# Patient Record
Sex: Female | Born: 1945
Health system: Southern US, Community
[De-identification: ages and names within clinical notes are randomized; demographics above are authoritative.]

## PROBLEM LIST (undated history)

## (undated) DIAGNOSIS — M659 Synovitis and tenosynovitis, unspecified: Secondary | ICD-10-CM

## (undated) DIAGNOSIS — I82409 Acute embolism and thrombosis of unspecified deep veins of unspecified lower extremity: Secondary | ICD-10-CM

## (undated) DIAGNOSIS — H409 Unspecified glaucoma: Secondary | ICD-10-CM

## (undated) DIAGNOSIS — D649 Anemia, unspecified: Secondary | ICD-10-CM

## (undated) DIAGNOSIS — K219 Gastro-esophageal reflux disease without esophagitis: Secondary | ICD-10-CM

## (undated) DIAGNOSIS — Z9889 Other specified postprocedural states: Secondary | ICD-10-CM

## (undated) DIAGNOSIS — E039 Hypothyroidism, unspecified: Secondary | ICD-10-CM

## (undated) DIAGNOSIS — E785 Hyperlipidemia, unspecified: Secondary | ICD-10-CM

## (undated) DIAGNOSIS — I10 Essential (primary) hypertension: Secondary | ICD-10-CM

## (undated) DIAGNOSIS — C801 Malignant (primary) neoplasm, unspecified: Secondary | ICD-10-CM

## (undated) DIAGNOSIS — E663 Overweight: Secondary | ICD-10-CM

## (undated) DIAGNOSIS — C689 Malignant neoplasm of urinary organ, unspecified: Secondary | ICD-10-CM

## (undated) DIAGNOSIS — T4145XA Adverse effect of unspecified anesthetic, initial encounter: Secondary | ICD-10-CM

## (undated) DIAGNOSIS — T8859XA Other complications of anesthesia, initial encounter: Secondary | ICD-10-CM

## (undated) DIAGNOSIS — D682 Hereditary deficiency of other clotting factors: Secondary | ICD-10-CM

## (undated) DIAGNOSIS — K8021 Calculus of gallbladder without cholecystitis with obstruction: Secondary | ICD-10-CM

## (undated) DIAGNOSIS — M1990 Unspecified osteoarthritis, unspecified site: Secondary | ICD-10-CM

## (undated) DIAGNOSIS — R112 Nausea with vomiting, unspecified: Secondary | ICD-10-CM

## (undated) HISTORY — PX: CHOLECYSTECTOMY: SHX55

## (undated) HISTORY — PX: OTHER SURGICAL HISTORY: SHX169

## (undated) HISTORY — DX: Malignant neoplasm of urinary organ, unspecified: C68.9

## (undated) HISTORY — PX: TONSILLECTOMY: SUR1361

---
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
## 1991-07-07 HISTORY — PX: HAND CONTRACTURE RELEASE: SHX1724

## 1996-07-06 HISTORY — PX: OVARY SURGERY: SHX727

## 1998-07-13 ENCOUNTER — Emergency Department (HOSPITAL_COMMUNITY): Admission: EM | Admit: 1998-07-13 | Discharge: 1998-07-13 | Payer: Self-pay | Admitting: Emergency Medicine

## 1999-05-01 ENCOUNTER — Other Ambulatory Visit: Admission: RE | Admit: 1999-05-01 | Discharge: 1999-05-01 | Payer: Self-pay | Admitting: Gynecology

## 1999-08-06 ENCOUNTER — Encounter: Admission: RE | Admit: 1999-08-06 | Discharge: 1999-08-06 | Payer: Self-pay | Admitting: Gynecology

## 1999-08-06 ENCOUNTER — Encounter: Payer: Self-pay | Admitting: Gynecology

## 2000-05-05 ENCOUNTER — Other Ambulatory Visit: Admission: RE | Admit: 2000-05-05 | Discharge: 2000-05-05 | Payer: Self-pay | Admitting: Gynecology

## 2000-07-19 ENCOUNTER — Ambulatory Visit (HOSPITAL_COMMUNITY): Admission: RE | Admit: 2000-07-19 | Discharge: 2000-07-19 | Payer: Self-pay | Admitting: Internal Medicine

## 2000-08-11 ENCOUNTER — Encounter: Admission: RE | Admit: 2000-08-11 | Discharge: 2000-08-11 | Payer: Self-pay | Admitting: Gynecology

## 2000-08-11 ENCOUNTER — Encounter: Payer: Self-pay | Admitting: Gynecology

## 2001-04-19 ENCOUNTER — Other Ambulatory Visit: Admission: RE | Admit: 2001-04-19 | Discharge: 2001-04-19 | Payer: Self-pay | Admitting: Gynecology

## 2001-06-07 ENCOUNTER — Inpatient Hospital Stay (HOSPITAL_COMMUNITY): Admission: RE | Admit: 2001-06-07 | Discharge: 2001-06-08 | Payer: Self-pay | Admitting: Gynecology

## 2001-06-07 ENCOUNTER — Encounter (INDEPENDENT_AMBULATORY_CARE_PROVIDER_SITE_OTHER): Payer: Self-pay

## 2001-08-18 ENCOUNTER — Encounter: Admission: RE | Admit: 2001-08-18 | Discharge: 2001-08-18 | Payer: Self-pay | Admitting: Gynecology

## 2001-08-18 ENCOUNTER — Encounter: Payer: Self-pay | Admitting: Gynecology

## 2002-04-24 ENCOUNTER — Other Ambulatory Visit: Admission: RE | Admit: 2002-04-24 | Discharge: 2002-04-24 | Payer: Self-pay | Admitting: Gynecology

## 2002-07-06 HISTORY — PX: KNEE ARTHROSCOPY: SUR90

## 2002-07-28 ENCOUNTER — Encounter: Admission: RE | Admit: 2002-07-28 | Discharge: 2002-07-28 | Payer: Self-pay | Admitting: Gynecology

## 2002-07-28 ENCOUNTER — Encounter: Payer: Self-pay | Admitting: Gynecology

## 2002-08-21 ENCOUNTER — Encounter: Payer: Self-pay | Admitting: Gynecology

## 2002-08-21 ENCOUNTER — Encounter: Admission: RE | Admit: 2002-08-21 | Discharge: 2002-08-21 | Payer: Self-pay | Admitting: Gynecology

## 2002-08-29 ENCOUNTER — Encounter: Admission: RE | Admit: 2002-08-29 | Discharge: 2002-08-29 | Payer: Self-pay | Admitting: Internal Medicine

## 2002-10-31 ENCOUNTER — Encounter: Admission: RE | Admit: 2002-10-31 | Discharge: 2002-10-31 | Payer: Self-pay | Admitting: Internal Medicine

## 2002-11-01 ENCOUNTER — Encounter: Admission: RE | Admit: 2002-11-01 | Discharge: 2002-11-01 | Payer: Self-pay | Admitting: Internal Medicine

## 2002-11-02 ENCOUNTER — Encounter: Payer: Self-pay | Admitting: Internal Medicine

## 2002-11-02 ENCOUNTER — Ambulatory Visit (HOSPITAL_COMMUNITY): Admission: RE | Admit: 2002-11-02 | Discharge: 2002-11-02 | Payer: Self-pay | Admitting: Internal Medicine

## 2002-11-03 ENCOUNTER — Inpatient Hospital Stay (HOSPITAL_COMMUNITY): Admission: AD | Admit: 2002-11-03 | Discharge: 2002-11-09 | Payer: Self-pay | Admitting: Internal Medicine

## 2002-11-08 ENCOUNTER — Encounter: Payer: Self-pay | Admitting: Internal Medicine

## 2002-11-13 ENCOUNTER — Encounter: Admission: RE | Admit: 2002-11-13 | Discharge: 2002-11-13 | Payer: Self-pay | Admitting: Internal Medicine

## 2002-11-24 ENCOUNTER — Encounter: Admission: RE | Admit: 2002-11-24 | Discharge: 2002-11-24 | Payer: Self-pay | Admitting: Internal Medicine

## 2002-11-27 ENCOUNTER — Encounter: Admission: RE | Admit: 2002-11-27 | Discharge: 2002-11-27 | Payer: Self-pay | Admitting: Internal Medicine

## 2002-11-28 ENCOUNTER — Encounter (INDEPENDENT_AMBULATORY_CARE_PROVIDER_SITE_OTHER): Payer: Self-pay | Admitting: Internal Medicine

## 2002-12-11 ENCOUNTER — Encounter: Admission: RE | Admit: 2002-12-11 | Discharge: 2002-12-11 | Payer: Self-pay | Admitting: Internal Medicine

## 2003-01-02 ENCOUNTER — Encounter: Payer: Self-pay | Admitting: Gastroenterology

## 2003-01-02 ENCOUNTER — Observation Stay (HOSPITAL_COMMUNITY): Admission: AD | Admit: 2003-01-02 | Discharge: 2003-01-04 | Payer: Self-pay | Admitting: Gastroenterology

## 2003-01-03 ENCOUNTER — Encounter: Payer: Self-pay | Admitting: General Surgery

## 2003-01-03 ENCOUNTER — Encounter (INDEPENDENT_AMBULATORY_CARE_PROVIDER_SITE_OTHER): Payer: Self-pay | Admitting: *Deleted

## 2003-01-22 ENCOUNTER — Encounter: Admission: RE | Admit: 2003-01-22 | Discharge: 2003-01-22 | Payer: Self-pay | Admitting: Internal Medicine

## 2003-02-19 ENCOUNTER — Encounter: Admission: RE | Admit: 2003-02-19 | Discharge: 2003-02-19 | Payer: Self-pay | Admitting: Internal Medicine

## 2003-03-19 ENCOUNTER — Encounter: Admission: RE | Admit: 2003-03-19 | Discharge: 2003-03-19 | Payer: Self-pay | Admitting: Infectious Diseases

## 2003-03-27 ENCOUNTER — Encounter: Payer: Self-pay | Admitting: Gastroenterology

## 2003-03-27 ENCOUNTER — Ambulatory Visit (HOSPITAL_COMMUNITY): Admission: RE | Admit: 2003-03-27 | Discharge: 2003-03-27 | Payer: Self-pay | Admitting: Gastroenterology

## 2003-04-16 ENCOUNTER — Encounter: Admission: RE | Admit: 2003-04-16 | Discharge: 2003-04-16 | Payer: Self-pay | Admitting: Internal Medicine

## 2003-04-23 ENCOUNTER — Other Ambulatory Visit: Admission: RE | Admit: 2003-04-23 | Discharge: 2003-04-23 | Payer: Self-pay | Admitting: Gynecology

## 2003-05-07 ENCOUNTER — Encounter: Admission: RE | Admit: 2003-05-07 | Discharge: 2003-05-07 | Payer: Self-pay | Admitting: Internal Medicine

## 2003-05-21 ENCOUNTER — Encounter: Admission: RE | Admit: 2003-05-21 | Discharge: 2003-05-21 | Payer: Self-pay | Admitting: Internal Medicine

## 2003-06-04 ENCOUNTER — Ambulatory Visit (HOSPITAL_COMMUNITY): Admission: RE | Admit: 2003-06-04 | Discharge: 2003-06-04 | Payer: Self-pay | Admitting: Gastroenterology

## 2003-06-04 LAB — HM COLONOSCOPY

## 2003-06-18 ENCOUNTER — Encounter: Admission: RE | Admit: 2003-06-18 | Discharge: 2003-06-18 | Payer: Self-pay | Admitting: Internal Medicine

## 2003-07-23 ENCOUNTER — Encounter: Admission: RE | Admit: 2003-07-23 | Discharge: 2003-07-23 | Payer: Self-pay | Admitting: Internal Medicine

## 2003-08-20 ENCOUNTER — Encounter: Admission: RE | Admit: 2003-08-20 | Discharge: 2003-08-20 | Payer: Self-pay | Admitting: Internal Medicine

## 2003-08-23 ENCOUNTER — Encounter: Admission: RE | Admit: 2003-08-23 | Discharge: 2003-08-23 | Payer: Self-pay | Admitting: Internal Medicine

## 2003-09-10 ENCOUNTER — Encounter: Admission: RE | Admit: 2003-09-10 | Discharge: 2003-09-10 | Payer: Self-pay | Admitting: Internal Medicine

## 2003-09-11 ENCOUNTER — Encounter: Admission: RE | Admit: 2003-09-11 | Discharge: 2003-09-11 | Payer: Self-pay | Admitting: Internal Medicine

## 2003-10-15 ENCOUNTER — Encounter: Admission: RE | Admit: 2003-10-15 | Discharge: 2003-10-15 | Payer: Self-pay | Admitting: Internal Medicine

## 2003-11-19 ENCOUNTER — Encounter: Admission: RE | Admit: 2003-11-19 | Discharge: 2003-11-19 | Payer: Self-pay | Admitting: Internal Medicine

## 2004-01-24 ENCOUNTER — Ambulatory Visit (HOSPITAL_COMMUNITY): Admission: RE | Admit: 2004-01-24 | Discharge: 2004-01-24 | Payer: Self-pay | Admitting: Orthopedic Surgery

## 2004-01-24 ENCOUNTER — Ambulatory Visit (HOSPITAL_BASED_OUTPATIENT_CLINIC_OR_DEPARTMENT_OTHER): Admission: RE | Admit: 2004-01-24 | Discharge: 2004-01-24 | Payer: Self-pay | Admitting: Orthopedic Surgery

## 2004-02-11 ENCOUNTER — Encounter: Admission: RE | Admit: 2004-02-11 | Discharge: 2004-02-11 | Payer: Self-pay | Admitting: Internal Medicine

## 2004-03-24 ENCOUNTER — Ambulatory Visit: Payer: Self-pay | Admitting: Internal Medicine

## 2004-04-21 ENCOUNTER — Ambulatory Visit: Payer: Self-pay | Admitting: Internal Medicine

## 2004-05-07 ENCOUNTER — Ambulatory Visit: Payer: Self-pay | Admitting: Internal Medicine

## 2004-05-15 ENCOUNTER — Ambulatory Visit: Payer: Self-pay | Admitting: Internal Medicine

## 2004-06-03 ENCOUNTER — Ambulatory Visit: Payer: Self-pay | Admitting: Internal Medicine

## 2004-07-03 ENCOUNTER — Ambulatory Visit: Payer: Self-pay | Admitting: Internal Medicine

## 2004-07-28 ENCOUNTER — Ambulatory Visit: Payer: Self-pay | Admitting: Internal Medicine

## 2004-08-11 ENCOUNTER — Ambulatory Visit: Payer: Self-pay | Admitting: Internal Medicine

## 2004-08-25 ENCOUNTER — Encounter: Admission: RE | Admit: 2004-08-25 | Discharge: 2004-08-25 | Payer: Self-pay | Admitting: Gynecology

## 2004-09-22 ENCOUNTER — Ambulatory Visit: Payer: Self-pay | Admitting: Internal Medicine

## 2004-10-02 ENCOUNTER — Ambulatory Visit: Payer: Self-pay | Admitting: Internal Medicine

## 2004-10-03 ENCOUNTER — Ambulatory Visit: Payer: Self-pay | Admitting: Internal Medicine

## 2004-10-20 ENCOUNTER — Ambulatory Visit: Payer: Self-pay | Admitting: Internal Medicine

## 2004-11-17 ENCOUNTER — Ambulatory Visit: Payer: Self-pay | Admitting: Internal Medicine

## 2004-11-24 ENCOUNTER — Ambulatory Visit: Payer: Self-pay | Admitting: Internal Medicine

## 2004-12-29 ENCOUNTER — Ambulatory Visit: Payer: Self-pay | Admitting: Internal Medicine

## 2005-01-26 ENCOUNTER — Ambulatory Visit: Payer: Self-pay | Admitting: Internal Medicine

## 2005-03-09 ENCOUNTER — Emergency Department (HOSPITAL_COMMUNITY): Admission: EM | Admit: 2005-03-09 | Discharge: 2005-03-09 | Payer: Self-pay | Admitting: Emergency Medicine

## 2005-03-23 ENCOUNTER — Ambulatory Visit: Payer: Self-pay | Admitting: Internal Medicine

## 2005-05-04 ENCOUNTER — Ambulatory Visit: Payer: Self-pay | Admitting: Internal Medicine

## 2005-06-18 ENCOUNTER — Ambulatory Visit: Payer: Self-pay | Admitting: Internal Medicine

## 2005-07-20 ENCOUNTER — Ambulatory Visit: Payer: Self-pay | Admitting: Internal Medicine

## 2005-08-17 ENCOUNTER — Ambulatory Visit: Payer: Self-pay | Admitting: Internal Medicine

## 2005-09-02 ENCOUNTER — Encounter (INDEPENDENT_AMBULATORY_CARE_PROVIDER_SITE_OTHER): Payer: Self-pay | Admitting: Internal Medicine

## 2005-09-02 ENCOUNTER — Encounter: Admission: RE | Admit: 2005-09-02 | Discharge: 2005-09-02 | Payer: Self-pay | Admitting: Gynecology

## 2005-09-17 ENCOUNTER — Encounter: Admission: RE | Admit: 2005-09-17 | Discharge: 2005-09-17 | Payer: Self-pay | Admitting: Gynecology

## 2005-09-28 ENCOUNTER — Ambulatory Visit: Payer: Self-pay | Admitting: Internal Medicine

## 2005-11-02 ENCOUNTER — Ambulatory Visit: Payer: Self-pay | Admitting: Hospitalist

## 2005-12-02 ENCOUNTER — Emergency Department (HOSPITAL_COMMUNITY): Admission: EM | Admit: 2005-12-02 | Discharge: 2005-12-02 | Payer: Self-pay | Admitting: Emergency Medicine

## 2005-12-14 ENCOUNTER — Ambulatory Visit: Payer: Self-pay | Admitting: Internal Medicine

## 2006-01-18 ENCOUNTER — Ambulatory Visit: Payer: Self-pay | Admitting: Internal Medicine

## 2006-02-02 ENCOUNTER — Ambulatory Visit: Payer: Self-pay | Admitting: Internal Medicine

## 2006-02-18 ENCOUNTER — Ambulatory Visit (HOSPITAL_COMMUNITY): Admission: RE | Admit: 2006-02-18 | Discharge: 2006-02-18 | Payer: Self-pay | Admitting: Sports Medicine

## 2006-03-01 ENCOUNTER — Ambulatory Visit: Payer: Self-pay | Admitting: Hospitalist

## 2006-03-29 ENCOUNTER — Ambulatory Visit: Payer: Self-pay | Admitting: Internal Medicine

## 2006-04-21 DIAGNOSIS — M25569 Pain in unspecified knee: Secondary | ICD-10-CM | POA: Insufficient documentation

## 2006-04-21 DIAGNOSIS — D539 Nutritional anemia, unspecified: Secondary | ICD-10-CM | POA: Insufficient documentation

## 2006-04-21 DIAGNOSIS — E039 Hypothyroidism, unspecified: Secondary | ICD-10-CM | POA: Insufficient documentation

## 2006-04-21 DIAGNOSIS — M659 Unspecified synovitis and tenosynovitis, unspecified site: Secondary | ICD-10-CM | POA: Insufficient documentation

## 2006-04-21 DIAGNOSIS — K8021 Calculus of gallbladder without cholecystitis with obstruction: Secondary | ICD-10-CM | POA: Insufficient documentation

## 2006-04-21 DIAGNOSIS — Z86718 Personal history of other venous thrombosis and embolism: Secondary | ICD-10-CM | POA: Insufficient documentation

## 2006-04-21 HISTORY — DX: Calculus of gallbladder without cholecystitis with obstruction: K80.21

## 2006-04-22 ENCOUNTER — Other Ambulatory Visit: Admission: RE | Admit: 2006-04-22 | Discharge: 2006-04-22 | Payer: Self-pay | Admitting: Gynecology

## 2006-04-25 ENCOUNTER — Emergency Department (HOSPITAL_COMMUNITY): Admission: EM | Admit: 2006-04-25 | Discharge: 2006-04-25 | Payer: Self-pay | Admitting: Emergency Medicine

## 2006-05-17 ENCOUNTER — Ambulatory Visit: Payer: Self-pay | Admitting: Hospitalist

## 2006-06-03 DIAGNOSIS — D6869 Other thrombophilia: Secondary | ICD-10-CM | POA: Insufficient documentation

## 2006-06-03 DIAGNOSIS — D649 Anemia, unspecified: Secondary | ICD-10-CM

## 2006-06-03 DIAGNOSIS — D689 Coagulation defect, unspecified: Secondary | ICD-10-CM

## 2006-06-03 DIAGNOSIS — E785 Hyperlipidemia, unspecified: Secondary | ICD-10-CM

## 2006-06-03 HISTORY — DX: Anemia, unspecified: D64.9

## 2006-06-03 HISTORY — DX: Hyperlipidemia, unspecified: E78.5

## 2006-06-21 ENCOUNTER — Ambulatory Visit: Payer: Self-pay | Admitting: *Deleted

## 2006-06-24 ENCOUNTER — Ambulatory Visit (HOSPITAL_BASED_OUTPATIENT_CLINIC_OR_DEPARTMENT_OTHER): Admission: RE | Admit: 2006-06-24 | Discharge: 2006-06-24 | Payer: Self-pay | Admitting: Orthopedic Surgery

## 2006-07-01 ENCOUNTER — Ambulatory Visit: Payer: Self-pay | Admitting: Internal Medicine

## 2006-07-06 HISTORY — PX: ANKLE ARTHROPLASTY: SUR68

## 2006-07-26 ENCOUNTER — Ambulatory Visit: Payer: Self-pay | Admitting: Internal Medicine

## 2006-07-27 ENCOUNTER — Emergency Department (HOSPITAL_COMMUNITY): Admission: EM | Admit: 2006-07-27 | Discharge: 2006-07-27 | Payer: Self-pay | Admitting: Family Medicine

## 2006-08-05 ENCOUNTER — Ambulatory Visit (HOSPITAL_BASED_OUTPATIENT_CLINIC_OR_DEPARTMENT_OTHER): Admission: RE | Admit: 2006-08-05 | Discharge: 2006-08-06 | Payer: Self-pay | Admitting: Orthopedic Surgery

## 2006-08-30 ENCOUNTER — Ambulatory Visit: Payer: Self-pay | Admitting: Internal Medicine

## 2006-08-30 LAB — CONVERTED CEMR LAB: INR: 4.7

## 2006-09-13 ENCOUNTER — Ambulatory Visit: Payer: Self-pay | Admitting: Hospitalist

## 2006-09-13 ENCOUNTER — Encounter (INDEPENDENT_AMBULATORY_CARE_PROVIDER_SITE_OTHER): Payer: Self-pay | Admitting: Pharmacist

## 2006-09-13 LAB — CONVERTED CEMR LAB

## 2006-09-20 ENCOUNTER — Ambulatory Visit: Payer: Self-pay | Admitting: *Deleted

## 2006-09-20 ENCOUNTER — Encounter (INDEPENDENT_AMBULATORY_CARE_PROVIDER_SITE_OTHER): Payer: Self-pay | Admitting: Pharmacist

## 2006-09-30 ENCOUNTER — Encounter (INDEPENDENT_AMBULATORY_CARE_PROVIDER_SITE_OTHER): Payer: Self-pay | Admitting: Internal Medicine

## 2006-09-30 ENCOUNTER — Encounter: Admission: RE | Admit: 2006-09-30 | Discharge: 2006-09-30 | Payer: Self-pay | Admitting: Gynecology

## 2006-10-04 ENCOUNTER — Ambulatory Visit: Payer: Self-pay | Admitting: *Deleted

## 2006-10-04 ENCOUNTER — Encounter (INDEPENDENT_AMBULATORY_CARE_PROVIDER_SITE_OTHER): Payer: Self-pay | Admitting: Pharmacist

## 2006-10-15 ENCOUNTER — Telehealth: Payer: Self-pay | Admitting: *Deleted

## 2006-10-25 ENCOUNTER — Ambulatory Visit: Payer: Self-pay | Admitting: Internal Medicine

## 2006-10-25 LAB — CONVERTED CEMR LAB: INR: 2.8

## 2006-11-13 ENCOUNTER — Emergency Department (HOSPITAL_COMMUNITY): Admission: EM | Admit: 2006-11-13 | Discharge: 2006-11-13 | Payer: Self-pay | Admitting: Emergency Medicine

## 2006-11-22 ENCOUNTER — Ambulatory Visit: Payer: Self-pay | Admitting: Internal Medicine

## 2006-11-22 LAB — CONVERTED CEMR LAB: INR: 2.5

## 2006-12-27 ENCOUNTER — Ambulatory Visit: Payer: Self-pay | Admitting: Internal Medicine

## 2006-12-27 LAB — CONVERTED CEMR LAB: INR: 2.3

## 2006-12-29 ENCOUNTER — Telehealth: Payer: Self-pay | Admitting: *Deleted

## 2007-02-14 ENCOUNTER — Ambulatory Visit: Payer: Self-pay | Admitting: Infectious Disease

## 2007-03-21 ENCOUNTER — Ambulatory Visit: Payer: Self-pay | Admitting: Internal Medicine

## 2007-03-21 ENCOUNTER — Encounter: Payer: Self-pay | Admitting: Pharmacist

## 2007-03-29 ENCOUNTER — Ambulatory Visit: Payer: Self-pay | Admitting: Internal Medicine

## 2007-03-29 DIAGNOSIS — S82899A Other fracture of unspecified lower leg, initial encounter for closed fracture: Secondary | ICD-10-CM | POA: Insufficient documentation

## 2007-03-29 LAB — CONVERTED CEMR LAB
Cholesterol: 234 mg/dL — ABNORMAL HIGH (ref 0–200)
Total CHOL/HDL Ratio: 2.8
Triglycerides: 90 mg/dL (ref <150)
VLDL: 18 mg/dL (ref 0–40)

## 2007-05-02 ENCOUNTER — Ambulatory Visit: Payer: Self-pay | Admitting: Infectious Diseases

## 2007-05-02 LAB — CONVERTED CEMR LAB

## 2007-06-06 ENCOUNTER — Ambulatory Visit: Payer: Self-pay | Admitting: Hospitalist

## 2007-06-06 LAB — CONVERTED CEMR LAB: INR: 2

## 2007-07-04 ENCOUNTER — Ambulatory Visit: Payer: Self-pay | Admitting: Internal Medicine

## 2007-07-04 LAB — CONVERTED CEMR LAB

## 2007-08-08 ENCOUNTER — Ambulatory Visit: Payer: Self-pay | Admitting: Hospitalist

## 2007-08-08 LAB — CONVERTED CEMR LAB: INR: 1.7

## 2007-09-08 ENCOUNTER — Ambulatory Visit: Payer: Self-pay | Admitting: *Deleted

## 2007-09-08 LAB — CONVERTED CEMR LAB: INR: 2.3

## 2007-10-03 ENCOUNTER — Ambulatory Visit: Payer: Self-pay | Admitting: *Deleted

## 2007-10-03 LAB — CONVERTED CEMR LAB: INR: 2.6

## 2007-10-18 ENCOUNTER — Encounter: Admission: RE | Admit: 2007-10-18 | Discharge: 2007-10-18 | Payer: Self-pay | Admitting: Gynecology

## 2007-11-02 ENCOUNTER — Encounter (INDEPENDENT_AMBULATORY_CARE_PROVIDER_SITE_OTHER): Payer: Self-pay | Admitting: Internal Medicine

## 2007-11-04 ENCOUNTER — Encounter (INDEPENDENT_AMBULATORY_CARE_PROVIDER_SITE_OTHER): Payer: Self-pay | Admitting: Internal Medicine

## 2007-11-14 ENCOUNTER — Ambulatory Visit: Payer: Self-pay | Admitting: Internal Medicine

## 2007-12-12 ENCOUNTER — Ambulatory Visit: Payer: Self-pay | Admitting: *Deleted

## 2007-12-12 LAB — CONVERTED CEMR LAB: INR: 5.6

## 2007-12-26 ENCOUNTER — Ambulatory Visit: Payer: Self-pay | Admitting: Internal Medicine

## 2007-12-26 LAB — CONVERTED CEMR LAB

## 2008-01-30 ENCOUNTER — Ambulatory Visit: Payer: Self-pay | Admitting: Internal Medicine

## 2008-01-30 LAB — CONVERTED CEMR LAB: INR: 2.2

## 2008-02-27 ENCOUNTER — Ambulatory Visit: Payer: Self-pay | Admitting: Internal Medicine

## 2008-03-13 ENCOUNTER — Telehealth: Payer: Self-pay | Admitting: *Deleted

## 2008-03-26 ENCOUNTER — Ambulatory Visit: Payer: Self-pay | Admitting: Internal Medicine

## 2008-03-27 ENCOUNTER — Encounter (INDEPENDENT_AMBULATORY_CARE_PROVIDER_SITE_OTHER): Payer: Self-pay | Admitting: Internal Medicine

## 2008-03-27 LAB — CONVERTED CEMR LAB: Pap Smear: NEGATIVE

## 2008-04-23 ENCOUNTER — Ambulatory Visit: Payer: Self-pay | Admitting: Internal Medicine

## 2008-04-24 ENCOUNTER — Ambulatory Visit: Payer: Self-pay | Admitting: Internal Medicine

## 2008-04-24 DIAGNOSIS — I10 Essential (primary) hypertension: Secondary | ICD-10-CM | POA: Insufficient documentation

## 2008-04-24 DIAGNOSIS — E669 Obesity, unspecified: Secondary | ICD-10-CM

## 2008-04-24 DIAGNOSIS — M8580 Other specified disorders of bone density and structure, unspecified site: Secondary | ICD-10-CM

## 2008-04-24 DIAGNOSIS — M810 Age-related osteoporosis without current pathological fracture: Secondary | ICD-10-CM | POA: Insufficient documentation

## 2008-04-24 DIAGNOSIS — H409 Unspecified glaucoma: Secondary | ICD-10-CM

## 2008-04-24 DIAGNOSIS — E663 Overweight: Secondary | ICD-10-CM

## 2008-04-24 HISTORY — DX: Overweight: E66.3

## 2008-04-24 HISTORY — DX: Unspecified glaucoma: H40.9

## 2008-04-25 ENCOUNTER — Encounter (INDEPENDENT_AMBULATORY_CARE_PROVIDER_SITE_OTHER): Payer: Self-pay | Admitting: Internal Medicine

## 2008-05-08 ENCOUNTER — Ambulatory Visit: Payer: Self-pay | Admitting: Internal Medicine

## 2008-05-28 ENCOUNTER — Ambulatory Visit: Payer: Self-pay | Admitting: *Deleted

## 2008-07-02 ENCOUNTER — Ambulatory Visit: Payer: Self-pay | Admitting: Infectious Diseases

## 2008-07-02 LAB — CONVERTED CEMR LAB: INR: 2.8

## 2008-07-24 ENCOUNTER — Telehealth: Payer: Self-pay | Admitting: *Deleted

## 2008-07-30 ENCOUNTER — Encounter (INDEPENDENT_AMBULATORY_CARE_PROVIDER_SITE_OTHER): Payer: Self-pay | Admitting: Internal Medicine

## 2008-07-30 ENCOUNTER — Ambulatory Visit: Payer: Self-pay | Admitting: Internal Medicine

## 2008-07-30 LAB — CONVERTED CEMR LAB: INR: 2.2

## 2008-08-07 LAB — CONVERTED CEMR LAB
Cholesterol: 220 mg/dL — ABNORMAL HIGH (ref 0–200)
Total CHOL/HDL Ratio: 3.3
Triglycerides: 96 mg/dL (ref <150)
VLDL: 19 mg/dL (ref 0–40)

## 2008-08-30 ENCOUNTER — Ambulatory Visit: Payer: Self-pay | Admitting: Internal Medicine

## 2008-08-30 LAB — CONVERTED CEMR LAB

## 2008-09-24 ENCOUNTER — Ambulatory Visit: Payer: Self-pay | Admitting: Internal Medicine

## 2008-10-01 ENCOUNTER — Ambulatory Visit: Payer: Self-pay | Admitting: Internal Medicine

## 2008-10-01 LAB — CONVERTED CEMR LAB

## 2008-10-18 ENCOUNTER — Encounter: Admission: RE | Admit: 2008-10-18 | Discharge: 2008-10-18 | Payer: Self-pay | Admitting: Gynecology

## 2008-10-29 ENCOUNTER — Ambulatory Visit: Payer: Self-pay | Admitting: Internal Medicine

## 2008-10-29 LAB — CONVERTED CEMR LAB: INR: 4.3

## 2008-11-01 ENCOUNTER — Encounter (INDEPENDENT_AMBULATORY_CARE_PROVIDER_SITE_OTHER): Payer: Self-pay | Admitting: Internal Medicine

## 2008-11-06 ENCOUNTER — Ambulatory Visit: Payer: Self-pay | Admitting: Internal Medicine

## 2008-11-07 LAB — CONVERTED CEMR LAB
HDL: 82 mg/dL (ref >39)
Triglycerides: 83 mg/dL (ref <150)

## 2008-11-09 ENCOUNTER — Encounter (INDEPENDENT_AMBULATORY_CARE_PROVIDER_SITE_OTHER): Payer: Self-pay | Admitting: Internal Medicine

## 2008-11-12 ENCOUNTER — Ambulatory Visit: Payer: Self-pay | Admitting: *Deleted

## 2008-11-12 LAB — CONVERTED CEMR LAB: INR: 2.8

## 2008-12-17 ENCOUNTER — Ambulatory Visit: Payer: Self-pay | Admitting: Internal Medicine

## 2008-12-17 LAB — CONVERTED CEMR LAB: INR: 2.2

## 2009-01-14 ENCOUNTER — Ambulatory Visit: Payer: Self-pay | Admitting: Internal Medicine

## 2009-01-14 LAB — CONVERTED CEMR LAB

## 2009-01-29 ENCOUNTER — Telehealth: Payer: Self-pay | Admitting: *Deleted

## 2009-02-11 ENCOUNTER — Ambulatory Visit: Payer: Self-pay | Admitting: Internal Medicine

## 2009-02-11 LAB — CONVERTED CEMR LAB: INR: 6.1

## 2009-02-18 ENCOUNTER — Ambulatory Visit: Payer: Self-pay | Admitting: Internal Medicine

## 2009-02-26 ENCOUNTER — Ambulatory Visit: Payer: Self-pay | Admitting: Internal Medicine

## 2009-02-26 LAB — CONVERTED CEMR LAB: INR: 2.3

## 2009-03-07 ENCOUNTER — Telehealth (INDEPENDENT_AMBULATORY_CARE_PROVIDER_SITE_OTHER): Payer: Self-pay | Admitting: *Deleted

## 2009-03-25 ENCOUNTER — Ambulatory Visit: Payer: Self-pay | Admitting: Internal Medicine

## 2009-04-04 ENCOUNTER — Encounter: Payer: Self-pay | Admitting: Internal Medicine

## 2009-04-04 LAB — FECAL OCCULT BLOOD, GUAIAC: Fecal Occult Blood: NEGATIVE

## 2009-04-04 LAB — CONVERTED CEMR LAB: Pap Smear: NORMAL

## 2009-04-09 ENCOUNTER — Ambulatory Visit: Payer: Self-pay | Admitting: Internal Medicine

## 2009-04-10 ENCOUNTER — Encounter: Payer: Self-pay | Admitting: Internal Medicine

## 2009-04-12 LAB — CONVERTED CEMR LAB: TSH: 0.183 microintl units/mL — ABNORMAL LOW (ref 0.350–4.5)

## 2009-04-29 ENCOUNTER — Ambulatory Visit: Payer: Self-pay | Admitting: Internal Medicine

## 2009-04-29 LAB — CONVERTED CEMR LAB: INR: 2.4

## 2009-05-27 ENCOUNTER — Ambulatory Visit: Payer: Self-pay | Admitting: Internal Medicine

## 2009-06-24 ENCOUNTER — Ambulatory Visit: Payer: Self-pay | Admitting: Internal Medicine

## 2009-06-24 LAB — CONVERTED CEMR LAB: INR: 1.8

## 2009-07-15 ENCOUNTER — Ambulatory Visit: Payer: Self-pay | Admitting: Internal Medicine

## 2009-07-15 LAB — CONVERTED CEMR LAB: INR: 2.3

## 2009-08-19 ENCOUNTER — Ambulatory Visit: Payer: Self-pay | Admitting: Internal Medicine

## 2009-08-19 LAB — CONVERTED CEMR LAB: INR: 2.1

## 2009-09-16 ENCOUNTER — Ambulatory Visit: Payer: Self-pay | Admitting: Infectious Diseases

## 2009-10-14 ENCOUNTER — Ambulatory Visit: Payer: Self-pay | Admitting: Internal Medicine

## 2009-10-14 LAB — CONVERTED CEMR LAB

## 2009-11-11 ENCOUNTER — Ambulatory Visit: Payer: Self-pay | Admitting: Internal Medicine

## 2009-12-09 ENCOUNTER — Ambulatory Visit: Payer: Self-pay | Admitting: Internal Medicine

## 2009-12-09 LAB — CONVERTED CEMR LAB: INR: 2.3

## 2010-01-13 ENCOUNTER — Ambulatory Visit: Payer: Self-pay | Admitting: Internal Medicine

## 2010-01-13 LAB — CONVERTED CEMR LAB: INR: 2.1

## 2010-02-10 ENCOUNTER — Ambulatory Visit: Payer: Self-pay | Admitting: Internal Medicine

## 2010-02-10 LAB — CONVERTED CEMR LAB

## 2010-02-14 ENCOUNTER — Telehealth: Payer: Self-pay | Admitting: Internal Medicine

## 2010-03-17 ENCOUNTER — Ambulatory Visit: Payer: Self-pay | Admitting: Internal Medicine

## 2010-04-28 ENCOUNTER — Ambulatory Visit: Payer: Self-pay | Admitting: Internal Medicine

## 2010-04-30 ENCOUNTER — Telehealth: Payer: Self-pay | Admitting: Internal Medicine

## 2010-05-20 ENCOUNTER — Encounter: Payer: Self-pay | Admitting: Internal Medicine

## 2010-05-21 ENCOUNTER — Ambulatory Visit: Payer: Self-pay | Admitting: Internal Medicine

## 2010-05-21 DIAGNOSIS — R5383 Other fatigue: Secondary | ICD-10-CM

## 2010-05-21 DIAGNOSIS — R5381 Other malaise: Secondary | ICD-10-CM | POA: Insufficient documentation

## 2010-05-21 DIAGNOSIS — F329 Major depressive disorder, single episode, unspecified: Secondary | ICD-10-CM | POA: Insufficient documentation

## 2010-05-23 LAB — CONVERTED CEMR LAB: Free T4: 1.72 ng/dL (ref 0.80–1.80)

## 2010-05-26 ENCOUNTER — Ambulatory Visit: Payer: Self-pay | Admitting: Internal Medicine

## 2010-05-26 LAB — CONVERTED CEMR LAB: INR: 3.6

## 2010-06-23 ENCOUNTER — Ambulatory Visit: Payer: Self-pay | Admitting: Internal Medicine

## 2010-06-23 LAB — CONVERTED CEMR LAB: INR: 2

## 2010-06-24 ENCOUNTER — Ambulatory Visit: Payer: Self-pay | Admitting: Internal Medicine

## 2010-07-26 DIAGNOSIS — D682 Hereditary deficiency of other clotting factors: Secondary | ICD-10-CM

## 2010-07-26 DIAGNOSIS — Z86718 Personal history of other venous thrombosis and embolism: Secondary | ICD-10-CM

## 2010-07-26 DIAGNOSIS — Z7901 Long term (current) use of anticoagulants: Secondary | ICD-10-CM

## 2010-07-28 ENCOUNTER — Ambulatory Visit
Admission: RE | Admit: 2010-07-28 | Discharge: 2010-07-28 | Payer: Self-pay | Source: Home / Self Care | Attending: Internal Medicine | Admitting: Internal Medicine

## 2010-07-28 HISTORY — DX: Overweight: E66.3

## 2010-07-28 HISTORY — DX: Hypothyroidism, unspecified: E03.9

## 2010-07-28 HISTORY — DX: Hyperlipidemia, unspecified: E78.5

## 2010-07-28 HISTORY — DX: Hereditary deficiency of other clotting factors: D68.2

## 2010-07-28 HISTORY — DX: Synovitis and tenosynovitis, unspecified: M65.9

## 2010-07-28 HISTORY — DX: Calculus of gallbladder without cholecystitis with obstruction: K80.21

## 2010-07-28 HISTORY — DX: Unspecified glaucoma: H40.9

## 2010-08-01 ENCOUNTER — Encounter: Payer: Self-pay | Admitting: Internal Medicine

## 2010-08-05 NOTE — Assessment & Plan Note (Signed)
Summary: 261/ds  Anticoagulant Therapy Managed by: [name]. [name]  PharmD CACP Referring MD: [name] [name] MD [facility] AttendingCoralee Pesa MD, [name] Indication 1: Deep vein thrombus Indication 2: Aftercare long term use Anticoagulants V58.61,V58.83 Start date: 08/06/2000 Duration: Indefinite  Patient Assessment Reviewed by: Chancy Milroy PharmD  Nov 11, 2009 Medication review: verified warfarin dosage & schedule,verified previous prescription medications, verified doses & any changes, verified new medications, reviewed OTC medications, reviewed OTC health products-vitamins supplements etc Complications: none Dietary changes: none   Health status changes: none   Lifestyle changes: none   Recent/future hospitalizations: none   Recent/future procedures: none   Recent/future dental: none Patient Assessment Part 2:  Have you MISSED ANY DOSES or CHANGED TABLETS?  No missed Warfarin doses or changed tablets.  Have you had any BRUISING or BLEEDING ( nose or gum bleeds,blood in urine or stool)?  No reported bruising or bleeding in nose, gums, urine, stool.  Have you STARTED or STOPPED any MEDICATIONS, including OTC meds,herbals or supplements?  No other medications or herbal supplements were started or stopped.  Have you CHANGED your DIET, especially green vegetables,or ALCOHOL intake?  No changes in diet or alcohol intake.  Have you had any ILLNESSES or HOSPITALIZATIONS?  No reported illnesses or hospitalizations  Have you had any signs of CLOTTING?(chest discomfort,dizziness,shortness of breath,arms tingling,slurred speech,swelling or redness in leg)    No chest discomfort, dizziness, shortness of breath, tingling in arm, slurred speech, swelling, or redness in leg.     Treatment  Target INR: 2.0-3.0 INR: 1.9  Date: 11/11/2009 Regimen In:  22.0mg /week INR reflects regimen in: 1.9  New  Tablet strength: : 4mg  Regimen Out:     Sunday: 1 Tablet     Monday: 1/2 Tablet  Tuesday: 1 Tablet     Wednesday: 1 Tablet     Thursday: 1/2 Tablet      Friday: 1 Tablet     Saturday: 1 Tablet Total Weekly: 24.0mg /week mg  Next INR Due: 12/09/2009 Adjusted by: [name]. [name] PharmD CACP   Return to anticoagulation clinic:  12/09/2009 Time of next visit: 1030    Allergies: No Known Drug Allergies

## 2010-08-05 NOTE — Assessment & Plan Note (Signed)
Summary: [name]  Anticoagulant Therapy Managed by: [name]. Pamela [name]  PharmD CACP Referring MD: [name] [name] MD [facility] Attending: Margarito Liner MD Indication 1: Deep vein thrombus Indication 2: Aftercare long term use Anticoagulants V58.61,V58.83 Start date: 08/06/2000 Duration: Indefinite  Patient Assessment Reviewed by: Chancy Milroy PharmD  March 17, 2010 Medication review: verified warfarin dosage & schedule,verified previous prescription medications, verified doses & any changes, verified new medications, reviewed OTC medications, reviewed OTC health products-vitamins supplements etc Complications: none Dietary changes: none   Health status changes: none   Lifestyle changes: none   Recent/future hospitalizations: none   Recent/future procedures: none   Recent/future dental: none Patient Assessment Part 2:  Have you MISSED ANY DOSES or CHANGED TABLETS?  No missed Warfarin doses or changed tablets.  Have you had any BRUISING or BLEEDING ( nose or gum bleeds,blood in urine or stool)?  No reported bruising or bleeding in nose, gums, urine, stool.  Have you STARTED or STOPPED any MEDICATIONS, including OTC meds,herbals or supplements?  No other medications or herbal supplements were started or stopped.  Have you CHANGED your DIET, especially green vegetables,or ALCOHOL intake?  No changes in diet or alcohol intake.  Have you had any ILLNESSES or HOSPITALIZATIONS?  No reported illnesses or hospitalizations  Have you had any signs of CLOTTING?(chest discomfort,dizziness,shortness of breath,arms tingling,slurred speech,swelling or redness in leg)    No chest discomfort, dizziness, shortness of breath, tingling in arm, slurred speech, swelling, or redness in leg.     Treatment  Target INR: 2.0-3.0 INR: 2.3  Date: 03/17/2010 Regimen In:  22.0mg /week INR reflects regimen in: 2.3  New  Tablet strength: : 4mg  Regimen Out:     Sunday: 1 Tablet     Monday: 1/2 Tablet   Tuesday: 1 Tablet     Wednesday: 1/2 Tablet     Thursday: 1 Tablet      Friday: 1/2 Tablet     Saturday: 1 Tablet Total Weekly: 22.0mg /week mg  Next INR Due: 04/14/2010 Adjusted by: [name]. [name] PharmD CACP   Return to anticoagulation clinic:  04/14/2010 Time of next visit: 1045    Allergies: No Known Drug Allergies

## 2010-08-05 NOTE — Assessment & Plan Note (Signed)
Summary: [name]  Anticoagulant Therapy Managed by: [name]. [name]  PharmD CACP Referring MD: [name] [name] MD [facility] Attending: Margarito Liner MD Indication 1: Deep vein thrombus Indication 2: Aftercare long term use Anticoagulants V58.61,V58.83 Start date: 08/06/2000 Duration: Indefinite  Patient Assessment Reviewed by: Chancy Milroy PharmD  May 26, 2010 Medication review: verified warfarin dosage & schedule,verified previous prescription medications, verified doses & any changes, verified new medications, reviewed OTC medications, reviewed OTC health products-vitamins supplements etc Complications: none Dietary changes: none   Health status changes: none   Lifestyle changes: none   Recent/future hospitalizations: none   Recent/future procedures: none   Recent/future dental: none Patient Assessment Part 2:  Have you MISSED ANY DOSES or CHANGED TABLETS?  No missed Warfarin doses or changed tablets.  Have you had any BRUISING or BLEEDING ( nose or gum bleeds,blood in urine or stool)?  No reported bruising or bleeding in nose, gums, urine, stool.  Have you STARTED or STOPPED any MEDICATIONS, including OTC meds,herbals or supplements?  No other medications or herbal supplements were started or stopped.  Have you CHANGED your DIET, especially green vegetables,or ALCOHOL intake?  No changes in diet or alcohol intake.  Have you had any ILLNESSES or HOSPITALIZATIONS?  No reported illnesses or hospitalizations  Have you had any signs of CLOTTING?(chest discomfort,dizziness,shortness of breath,arms tingling,slurred speech,swelling or redness in leg)    No chest discomfort, dizziness, shortness of breath, tingling in arm, slurred speech, swelling, or redness in leg.     Treatment  Target INR: 2.0-3.0 INR: 3.6  Date: 05/26/2010 Regimen In:  22.0mg /week INR reflects regimen in: 3.6  New  Tablet strength: : 4mg  Regimen Out:     Sunday: 1/2 Tablet     Monday: 1 Tablet  Tuesday: 1/2 Tablet     Wednesday: 1 Tablet     Thursday: 1/2 Tablet      Friday: 1 Tablet     Saturday: 1/2 Tablet Total Weekly: 20.0mg /week mg  Next INR Due: 06/23/2010 Adjusted by: [name]. [name] PharmD CACP   Return to anticoagulation clinic:  06/23/2010 Time of next visit: 1000    Allergies: No Known Drug Allergies

## 2010-08-05 NOTE — Progress Notes (Signed)
Summary: medication-Coumadin/gp  Phone Note Refill Request Message from:  Fax from Pharmacy on February 14, 2010 3:44 PM  Refills Requested: Medication #1:  WARFARIN SODIUM  TABS Tablet Strength: 4mg  Take as directed [facility]. states pt. has been getting brand name Coumadin for several years; do u want brand or generic?   Thanks  Initial call taken by: Chinita Pester RN,  February 14, 2010 3:45 PM  Follow-up for Phone Call        generic is fine. Follow-up by: Zoila Shutter MD,  February 14, 2010 3:51 PM  Additional Follow-up for Phone Call Additional follow up Details #1::        [facility] called and made awared. Additional Follow-up by: Chinita Pester RN,  February 14, 2010 4:26 PM    Prescriptions: WARFARIN SODIUM  TABS (WARFARIN SODIUM TABS) Tablet Strength: 4mg  Take as directed  #60 x 12   Entered and Authorized by:   Zoila Shutter MD   Signed by:   Zoila Shutter MD on 02/14/2010   Method used:   Telephoned to ...       [facility] (retail)       [address] [place]       [address]       [place], [place]  [address]       Ph: [phone]       Fax: [phone]   RxID:   [id number]

## 2010-08-05 NOTE — Assessment & Plan Note (Signed)
Summary: [name]  Anticoagulant Therapy Managed by: [name]. [name]  PharmD CACP Referring MD: [name] [name] MD [facility] AttendingCoralee Pesa MD, [name] Indication 1: Deep vein thrombus Indication 2: Aftercare long term use Anticoagulants V58.61,V58.83 Start date: 08/06/2000 Duration: Indefinite  Patient Assessment Reviewed by: Chancy Milroy PharmD  August 19, 2009 Medication review: verified warfarin dosage & schedule,verified previous prescription medications, verified doses & any changes, verified new medications, reviewed OTC medications, reviewed OTC health products-vitamins supplements etc Complications: none Dietary changes: none   Health status changes: none   Lifestyle changes: none   Recent/future hospitalizations: none   Recent/future procedures: none   Recent/future dental: none Patient Assessment Part 2:  Have you MISSED ANY DOSES or CHANGED TABLETS?  No missed Warfarin doses or changed tablets.  Have you had any BRUISING or BLEEDING ( nose or gum bleeds,blood in urine or stool)?  No reported bruising or bleeding in nose, gums, urine, stool.  Have you STARTED or STOPPED any MEDICATIONS, including OTC meds,herbals or supplements?  No other medications or herbal supplements were started or stopped.  Have you CHANGED your DIET, especially green vegetables,or ALCOHOL intake?  No changes in diet or alcohol intake.  Have you had any ILLNESSES or HOSPITALIZATIONS?  No reported illnesses or hospitalizations  Have you had any signs of CLOTTING?(chest discomfort,dizziness,shortness of breath,arms tingling,slurred speech,swelling or redness in leg)    No chest discomfort, dizziness, shortness of breath, tingling in arm, slurred speech, swelling, or redness in leg.     Treatment  Target INR: 2.0-3.0 INR: 2.1  Date: 08/19/2009 Regimen In:  22.0mg /week INR reflects regimen in: 2.1  New  Tablet strength: : 4mg  Regimen Out:     Sunday: 1 Tablet     Monday: 1/2  Tablet     Tuesday: 1 Tablet     Wednesday: 1/2 Tablet     Thursday: 1 Tablet      Friday: 1/2 Tablet     Saturday: 1 Tablet Total Weekly: 22.0mg /week mg  Next INR Due: 09/16/2009 Adjusted by: [name]. [name] PharmD CACP   Return to anticoagulation clinic:  09/16/2009 Time of next visit: 1030    Allergies: No Known Drug Allergies

## 2010-08-05 NOTE — Assessment & Plan Note (Signed)
Summary: [name]  Anticoagulant Therapy Managed by: [name]. [name]  PharmD CACP Referring MD: [name] [name] MD [facility] AttendingOnalee Hua MD, Manrique Indication 1: Deep vein thrombus Indication 2: Aftercare long term use Anticoagulants V58.61,V58.83 Start date: 08/06/2000 Duration: Indefinite  Patient Assessment Reviewed by: Chancy Milroy PharmD  April 28, 2010 Medication review: verified warfarin dosage & schedule,verified previous prescription medications, verified doses & any changes, verified new medications, reviewed OTC medications, reviewed OTC health products-vitamins supplements etc Complications: none Dietary changes: none   Health status changes: none   Lifestyle changes: none   Recent/future hospitalizations: none   Recent/future procedures: none   Recent/future dental: none Patient Assessment Part 2:  Have you MISSED ANY DOSES or CHANGED TABLETS?  No missed Warfarin doses or changed tablets.  Have you had any BRUISING or BLEEDING ( nose or gum bleeds,blood in urine or stool)?  No reported bruising or bleeding in nose, gums, urine, stool.  Have you STARTED or STOPPED any MEDICATIONS, including OTC meds,herbals or supplements?  No other medications or herbal supplements were started or stopped.  Have you CHANGED your DIET, especially green vegetables,or ALCOHOL intake?  No changes in diet or alcohol intake.  Have you had any ILLNESSES or HOSPITALIZATIONS?  No reported illnesses or hospitalizations  Have you had any signs of CLOTTING?(chest discomfort,dizziness,shortness of breath,arms tingling,slurred speech,swelling or redness in leg)    No chest discomfort, dizziness, shortness of breath, tingling in arm, slurred speech, swelling, or redness in leg.     Treatment  Target INR: 2.0-3.0 INR: 3.0  Date: 04/28/2010 Regimen In:  22.0mg /week INR reflects regimen in: 3.0  New  Tablet strength: : 4mg  Regimen Out:     Sunday: 1 Tablet     Monday: 1/2  Tablet     Tuesday: 1 Tablet     Wednesday: 1/2 Tablet     Thursday: 1 Tablet      Friday: 1/2 Tablet     Saturday: 1 Tablet Total Weekly: 22.0mg /week mg  Next INR Due: 05/26/2010 Adjusted by: [name]. [name] PharmD CACP   Return to anticoagulation clinic:  05/26/2010 Time of next visit: 1000    Allergies: No Known Drug Allergies

## 2010-08-05 NOTE — Assessment & Plan Note (Signed)
Summary: EST-ROUTINE CHECKUP/CH   Vital Signs:  Patient profile:   65 year old female Height:      64.5 inches (163.83 cm) Weight:      190.4 pounds (86.55 kg) BMI:     32.29 Temp:     97.2 degrees F (36.22 degrees C) oral Pulse rate:   72 / minute BP sitting:   136 / 82  (left arm) Cuff size:   large  Vitals Entered By: Cynda Familia Duncan Dull) (May 21, 2010 8:33 AM) CC: routine f/u Is Patient Diabetic? No Pain Assessment Patient in pain? no      Nutritional Status BMI of > 30 = obese  Have you ever been in a relationship where you felt threatened, hurt or afraid?No   Does patient need assistance? Functional Status Self care Ambulation Normal   CC:  routine f/u.  History of Present Illness: 65 yr old who comes in for one yr follow up of:  1. Hypothyroid: In 10/10 she was feeling low energy and wondered whether increasing her dose would help. The plan at that time was to increase her dose and for her to follow up in 2 months. Even thought she was hyperthyroid by labs (TSH 1.85, freeT4 suppressed), we agreed to this for 2 months. However she said she had trouble getting back in and has been on this dose for a year. We discussed today that she has almost certainly been on too much and she is aware of this. It has not helped her to have more energy or feel better and she is aware that she will almost certainly need to go down on her dose pending todays labs.  2. Elevated BP without diagnosis of HTN: Her blood pressure is normal today.  3. Health maintenance: Up to date.  And she has a new problem:  4. She thinks she is depressed. She does not have any energy. Since she has retired she is not sure of what she wants to do next. She does have EMA 2-3 noghts a week. Her appetite is decreased. She is not exercising like she used to, she is more introverted than she used to be, and didn't go to a party for a friend this week that she would normally have gone to. She does think  that her mother had untreated depression.   Preventive Screening-Counseling & Management  Alcohol-Tobacco     Alcohol drinks/day: 1-2 glasses     Alcohol type: wine     Smoking Status: quit     Year Quit: 1987  Current Medications (verified): 1)  Levothyroxine Sodium 112 Mcg Tabs (Levothyroxine Sodium) .... Take 1 Tablet By Mouth Once A Day 2)  Daily Combo Multivits/calcium Tabs (Multiple Vitamins-Calcium) .... Take 1 Tablet By Mouth Once A Day 3)  Warfarin Sodium  Tabs (Warfarin Sodium Tabs) .... Tablet Strength: 4mg  Take As Directed 4)  Lumigan 0.03 % Soln (Bimatoprost) .... Once Daily 5)  Zostavax 45409 Unt/0.72ml Solr (Zoster Vaccine Live) .... Please Inject Im X 1 (Shingels Vaccine)  Allergies (verified): No Known Drug Allergies  Past History:  Family History: Last updated: 05/21/2010 Father lived to 63 yo Mother died of H&N cancer (smoker) Sister - lung cancer found early, 2011  Social History: Last updated: 04/24/2008 Married Former Smoker- smoked for  ~68yrs (quit 1988) Drug use-no Regular exercise-no  Past Medical History: Reviewed history from 04/21/2006 and no changes required. 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  etiology ([place]) Hyperlipidemia, mild - HDL 85 Knee pain (Murphy/Wainer) Cholelithiasis with obstruction - s/p ERCP,sphincterotomy, stent ([place]) Tenosynovitis, finger - s/p sgy ([place]) 7/05  Family History: Father lived to 81 yo Mother died of H&N cancer (smoker) Sister - lung cancer found early, 2011  Social History: Reviewed history from 04/24/2008 and no changes required. Married Former Smoker- smoked for  ~71yrs (quit 1988) Drug use-no Regular exercise-no  Review of Systems General:  Complains of fatigue; denies chills, fever, sweats, and weakness. CV:  Denies chest pain or discomfort and palpitations. Resp:  Denies cough and shortness of breath. GI:   Denies abdominal pain, diarrhea, nausea, and vomiting. Psych:  psych as per HPI.  Physical Exam  General:  alert and well-developed.   Head:  normocephalic and atraumatic.   Eyes:  vision grossly intact.   Neck:  supple and full ROM.   Lungs:  normal respiratory effort and normal breath sounds.   Heart:  normal rate, regular rhythm, and no murmur.   Abdomen:  soft, non-tender, and normal bowel sounds.   Msk:  no calf tenderness or pain Extremities:  no edema Psych:  Oriented X3, memory intact for recent and remote, and not depressed appearing.  though definitely answers many questions that suggest depression.   Impression & Recommendations:  Problem # 1:  DEPRESSION (ICD-311) Assessment New  Pamela [name] has been thinking about an anti-depressant even before she came in today and I definitely think that she can benefit from one. Some of this is situational, i.e. retirement, so she may need it only 6-12 months, but I think it is very likely to help her. I will begin sertaline 50mg -1/2 pill a day for 6 days, and then increase to 1 a day. Risks, benefits and side effects explained. She is to call if she has problems. She denies suicidal ideation. She will follow up in 3-4 weeks.   Her updated medication list for this problem includes:    Sertraline Hcl 50 Mg Tabs (Sertraline hcl) .[name]... Take one a day in the morning  Problem # 2:  HYPOTHYROIDISM (ICD-244.9) As discussed in HPI will check TSH and free T4 today. I suspect that she is over treated at this point and we will need to go down on the dose. Will let her know when the labs return.  Her updated medication list for this problem includes:    Levothyroxine Sodium 112 Mcg Tabs (Levothyroxine sodium) .[name]... Take 1 tablet by mouth once a day  Orders: T-T4, Free [phone]) T-TSH [phone])  Problem # 3:  ELEVATED BLOOD PRESSURE WITHOUT DIAGNOSIS OF HYPERTENSION (ICD-796.2) Assessment: Improved BP is well controlled today. Will  follow.  Problem # 4:  FATIGUE (ICD-780.79) This may be multifactorial, but I suspect it will improve with sertaline. Also if she restarts exercise after being on sertaline that will help as well.  Problem # 5:  Preventive Health Care (ICD-V70.0) Colonoscopy done in 2004, completely normal, no family history. Repeat in 2014. Pap, 03/2010. Mammogram 08/2009. Pneumovax 2010. Flu shot given 2011. Prescription given for her to get a Zostavax administered at [place], her pharmacy.  Complete Medication List: 1)  Levothyroxine Sodium 112 Mcg Tabs (Levothyroxine sodium) .... Take 1 tablet by mouth once a day 2)  Daily Combo Multivits/calcium Tabs (Multiple vitamins-calcium) .... Take 1 tablet by mouth once a day 3)  Warfarin Sodium Tabs (Warfarin sodium tabs) .... Tablet strength: 4mg  take as directed 4)  Lumigan 0.03 % Soln (Bimatoprost) .... Once daily 5)  Zostavax  19400 Unt/0.67ml Solr (Zoster vaccine live) .... Please inject im x 1 (shingels vaccine) 6)  Sertraline Hcl 50 Mg Tabs (Sertraline hcl) .... Take one a day in the morning  Patient Instructions: 1)  Please schedule a follow-up appointment in 3-4 weeks. 2)  Begin Sertaline 50mg  1/2 a day for 6 days, then one a day. 3)  We will let you know when the thyroid tests return. 4)  Happy Thanksgiiving!!! 5)    Prescriptions: SERTRALINE HCL 50 MG TABS (SERTRALINE HCL) take one a day in the morning  #31 x 6   Entered and Authorized by:   Zoila Shutter MD   Signed by:   Zoila Shutter MD on 05/21/2010   Method used:   Faxed to ...       [facility] (retail)       [address] [place]       [address]       [place], [place]  [address]       Ph: [phone]       Fax: [phone]   RxID:   [id number] ZOSTAVAX 19400 UNT/0.65ML SOLR (ZOSTER VACCINE LIVE) Please inject IM x 1 (Shingels vaccine)  #1 x 0   Entered by:   Cynda Familia (AAMA)   Authorized by:   Zoila Shutter MD   Signed by:   Cynda Familia (AAMA) on  05/21/2010   Method used:   Print then Give to Patient   RxID:   [id number]    Orders Added: 1)  T-T4, Free [[id number]] 2)  T-TSH [[id number]] 3)  Est. Patient Level IV [10932]   Immunization History:  Influenza Immunization History:    Influenza:  historical (03/06/2010)   Immunization History:  Influenza Immunization History:    Influenza:  Historical (03/06/2010) Process Orders Check Orders Results:     [facility]: Check successful Tests Sent for requisitioning (May 21, 2010 7:29 PM):     05/21/2010: [facility] -- [place], [place] [[id number]] (signed)     05/21/2010: [facility] -- T-TSH [phone] (signed)     Prevention & Chronic Care Immunizations   Influenza vaccine: Historical  (03/06/2010)   Influenza vaccine deferral: Deferred  (04/09/2009)    Tetanus booster: 02/16/2007: given   Tetanus booster due: 02/15/2017    Pneumococcal vaccine: Pneumovax  (04/09/2009)    H. zoster vaccine: Not documented  Colorectal Screening   Hemoccult: negative x 3 (per pt.)  (04/04/2009)    Colonoscopy:  Results: Hemorrhoids.     Location:  [facility].     (06/04/2003)   Colonoscopy action/deferral: Repeat colonoscopy in 5 years.   (06/04/2003)   Colonoscopy due: 06/03/2013  Other Screening   Pap smear: normal exam by Dr. Teodora Medici, gyn  (04/04/2009)    Mammogram: No specific mammographic evidence of malignancy.  Assessment: BIRADS 1. Location: [facility].     (10/18/2008)   Mammogram action/deferral: Screening mammogram in 1 year.     (10/18/2008)   Mammogram due: 10/2008    DXA bone density scan: Lumbar Spine:  T Score > -1.0 Spine (-0.3).  Left Hip Total: T Score -2.5 to -1.0 Hip (-1.5).   No significant change from 09/30/06 study. Location:  [facility].      (10/18/2008)   DXA scan due: 10/2008    Smoking status: quit   (05/21/2010)  Lipids   Total Cholesterol: 236  (11/06/2008)   LDL: 137  (11/06/2008)   LDL Direct: Not documented  HDL: 82  (11/06/2008)   Triglycerides: 83  (11/06/2008)    SGOT (AST): Not documented   SGPT (ALT): Not documented   Alkaline phosphatase: Not documented   Total bilirubin: Not documented    Lipid flowsheet reviewed?: Yes   Progress toward LDL goal: Unchanged   Lipid comments: she has excellent HDL, not fasting today will return fasting in 1 month  Self-Management Support :    Lipid self-management support: Not documented

## 2010-08-05 NOTE — Assessment & Plan Note (Signed)
Summary: COU/VS  Anticoagulant Therapy Managed by: [name]. [name]  PharmD CACP Referring MD: [name] [name] MD [facility] AttendingSampson Goon MD, Onalee Hua Indication 1: Deep vein thrombus Indication 2: Aftercare long term use Anticoagulants V58.61,V58.83 Start date: 08/06/2000 Duration: Indefinite  Patient Assessment Reviewed by: Chancy Milroy PharmD  September 16, 2009 Medication review: verified warfarin dosage & schedule,verified previous prescription medications, verified doses & any changes, verified new medications, reviewed OTC medications, reviewed OTC health products-vitamins supplements etc Complications: none Dietary changes: none   Health status changes: none   Lifestyle changes: none   Recent/future hospitalizations: none   Recent/future procedures: none   Recent/future dental: none Patient Assessment Part 2:  Have you MISSED ANY DOSES or CHANGED TABLETS?  No missed Warfarin doses or changed tablets.  Have you had any BRUISING or BLEEDING ( nose or gum bleeds,blood in urine or stool)?  No reported bruising or bleeding in nose, gums, urine, stool.  Have you STARTED or STOPPED any MEDICATIONS, including OTC meds,herbals or supplements?  No other medications or herbal supplements were started or stopped.  Have you CHANGED your DIET, especially green vegetables,or ALCOHOL intake?  No changes in diet or alcohol intake.  Have you had any ILLNESSES or HOSPITALIZATIONS?  No reported illnesses or hospitalizations  Have you had any signs of CLOTTING?(chest discomfort,dizziness,shortness of breath,arms tingling,slurred speech,swelling or redness in leg)    No chest discomfort, dizziness, shortness of breath, tingling in arm, slurred speech, swelling, or redness in leg.     Treatment  Target INR: 2.0-3.0 INR: 3.4  Date: 09/16/2009 Regimen In:  22.0mg /week INR reflects regimen in: 3.4  New  Tablet strength: : 4mg  Regimen Out:     Sunday: 1/2 Tablet     Monday: 1 Tablet    Tuesday: 1/2 Tablet     Wednesday: 1 Tablet     Thursday: 1/2 Tablet      Friday: 1 Tablet     Saturday: 1/2 Tablet Total Weekly: 20.0mg /week mg  Next INR Due: 10/14/2009 Adjusted by: [name]. [name] PharmD CACP   Return to anticoagulation clinic:  10/14/2009 Time of next visit: 1030    Allergies: No Known Drug Allergies

## 2010-08-05 NOTE — Assessment & Plan Note (Signed)
Summary: COU/VS  Anticoagulant Therapy Managed by: [name]. [name]  PharmD CACP Referring MD: [name] [name] MD [facility] Attending: Josem Kaufmann MD, [place] Indication 1: Deep vein thrombus Indication 2: Aftercare long term use Anticoagulants V58.61,V58.83 Start date: 08/06/2000 Duration: Indefinite  Patient Assessment Reviewed by: Chancy Milroy PharmD  July 15, 2009 Medication review: verified warfarin dosage & schedule,verified previous prescription medications, verified doses & any changes, verified new medications, reviewed OTC medications, reviewed OTC health products-vitamins supplements etc Complications: none Dietary changes: none   Health status changes: none   Lifestyle changes: none   Recent/future hospitalizations: none   Recent/future procedures: none   Recent/future dental: none Patient Assessment Part 2:  Have you MISSED ANY DOSES or CHANGED TABLETS?  No missed Warfarin doses or changed tablets.  Have you had any BRUISING or BLEEDING ( nose or gum bleeds,blood in urine or stool)?  No reported bruising or bleeding in nose, gums, urine, stool.  Have you STARTED or STOPPED any MEDICATIONS, including OTC meds,herbals or supplements?  No other medications or herbal supplements were started or stopped.  Have you CHANGED your DIET, especially green vegetables,or ALCOHOL intake?  No changes in diet or alcohol intake.  Have you had any ILLNESSES or HOSPITALIZATIONS?  No reported illnesses or hospitalizations  Have you had any signs of CLOTTING?(chest discomfort,dizziness,shortness of breath,arms tingling,slurred speech,swelling or redness in leg)    No chest discomfort, dizziness, shortness of breath, tingling in arm, slurred speech, swelling, or redness in leg.     Treatment  Target INR: 2.0-3.0 INR: 2.3  Date: 07/15/2009 Regimen In:  22.0mg /week INR reflects regimen in: 2.3  New  Tablet strength: : 4mg  Regimen Out:     Sunday: 1 Tablet     Monday: 1/2 Tablet    Tuesday: 1 Tablet     Wednesday: 1/2 Tablet     Thursday: 1 Tablet      Friday: 1/2 Tablet     Saturday: 1 Tablet Total Weekly: 22.0mg /week mg  Next INR Due: 08/12/2009 Adjusted by: [name]. [name] PharmD CACP   Return to anticoagulation clinic:  08/12/2009 Time of next visit: 1000   Comments: Patient counseled/cautioned regarding falls avoidance with impending inclement/icy conditions expected within next 1-2 days.   Allergies: No Known Drug Allergies

## 2010-08-05 NOTE — Assessment & Plan Note (Signed)
Summary: [name]  Anticoagulant Therapy Managed by: [name]. [name]  PharmD CACP Referring MD: [name] [name] MD [facility] Attending: Margarito Liner MD Indication 1: Deep vein thrombus Indication 2: Aftercare long term use Anticoagulants V58.61,V58.83 Start date: 08/06/2000 Duration: Indefinite  Patient Assessment Reviewed by: Chancy Milroy PharmD  January 13, 2010 Medication review: verified warfarin dosage & schedule,verified previous prescription medications, verified doses & any changes, verified new medications, reviewed OTC medications, reviewed OTC health products-vitamins supplements etc Complications: none Dietary changes: none   Health status changes: none   Lifestyle changes: none   Recent/future hospitalizations: none   Recent/future procedures: none   Recent/future dental: none Patient Assessment Part 2:  Have you MISSED ANY DOSES or CHANGED TABLETS?  No missed Warfarin doses or changed tablets.  Have you had any BRUISING or BLEEDING ( nose or gum bleeds,blood in urine or stool)?  No reported bruising or bleeding in nose, gums, urine, stool.  Have you STARTED or STOPPED any MEDICATIONS, including OTC meds,herbals or supplements?  No other medications or herbal supplements were started or stopped.  Have you CHANGED your DIET, especially green vegetables,or ALCOHOL intake?  No changes in diet or alcohol intake.  Have you had any ILLNESSES or HOSPITALIZATIONS?  No reported illnesses or hospitalizations  Have you had any signs of CLOTTING?(chest discomfort,dizziness,shortness of breath,arms tingling,slurred speech,swelling or redness in leg)    No chest discomfort, dizziness, shortness of breath, tingling in arm, slurred speech, swelling, or redness in leg.     Treatment  Target INR: 2.0-3.0 INR: 2.1  Date: 01/13/2010 Regimen In:  24.0mg /week INR reflects regimen in: 2.1  New  Tablet strength: : 4mg  Regimen Out:     Sunday: 1 Tablet     Monday: 1/2 Tablet  Tuesday: 1 Tablet     Wednesday: 1 Tablet     Thursday: 1/2 Tablet      Friday: 1 Tablet     Saturday: 1 Tablet Total Weekly: 24.0mg /week mg  Next INR Due: 02/10/2010 Adjusted by: [name]. [name] PharmD CACP   Return to anticoagulation clinic:  02/10/2010 Time of next visit: 1030    Allergies: No Known Drug Allergies

## 2010-08-05 NOTE — Progress Notes (Signed)
Summary: med refill/gp  Phone Note Refill Request Message from:  Fax from Pharmacy on February 14, 2010 10:28 AM  Refills Requested: Medication #1:  WARFARIN SODIUM  TABS Tablet Strength: 4mg  Take as directed   Last Refilled: 12/10/2009 Last appt. w/Jay Groce Aug. 8;last OV was 04/09/09.   Method Requested: Fax to Local Pharmacy Initial call taken by: Chinita Pester RN,  February 14, 2010 10:27 AM  Follow-up for Phone Call        Rx refill request faxed to [facility] pharmacy. Follow-up by: Chinita Pester RN,  February 14, 2010 1:53 PM    Prescriptions: WARFARIN SODIUM  TABS (WARFARIN SODIUM TABS) Tablet Strength: 4mg  Take as directed  #60 x 12   Entered and Authorized by:   Zoila Shutter MD   Signed by:   Zoila Shutter MD on 02/14/2010   Method used:   Faxed to ...       [facility] (retail)       [address] [place]       [address]       [place], [place]  [address]       Ph: [phone]       Fax: [phone]   RxID:   [id number]

## 2010-08-05 NOTE — Assessment & Plan Note (Signed)
Summary: 261/ds  Anticoagulant Therapy Managed by: [name]. [name]  PharmD CACP Referring MD: [name] [name] MD [facility] AttendingCoralee Pesa MD, [name] Indication 1: Deep vein thrombus Indication 2: Aftercare long term use Anticoagulants V58.61,V58.83 Start date: 08/06/2000 Duration: Indefinite  Patient Assessment Reviewed by: Chancy Milroy PharmD  December 09, 2009 Medication review: verified warfarin dosage & schedule,verified previous prescription medications, verified doses & any changes, verified new medications, reviewed OTC medications, reviewed OTC health products-vitamins supplements etc Complications: none Dietary changes: none   Health status changes: none   Lifestyle changes: none   Recent/future hospitalizations: none   Recent/future procedures: none   Recent/future dental: none Patient Assessment Part 2:  Have you MISSED ANY DOSES or CHANGED TABLETS?  No missed Warfarin doses or changed tablets.  Have you had any BRUISING or BLEEDING ( nose or gum bleeds,blood in urine or stool)?  No reported bruising or bleeding in nose, gums, urine, stool.  Have you STARTED or STOPPED any MEDICATIONS, including OTC meds,herbals or supplements?  No other medications or herbal supplements were started or stopped.  Have you CHANGED your DIET, especially green vegetables,or ALCOHOL intake?  No changes in diet or alcohol intake.  Have you had any ILLNESSES or HOSPITALIZATIONS?  No reported illnesses or hospitalizations  Have you had any signs of CLOTTING?(chest discomfort,dizziness,shortness of breath,arms tingling,slurred speech,swelling or redness in leg)    No chest discomfort, dizziness, shortness of breath, tingling in arm, slurred speech, swelling, or redness in leg.     Treatment  Target INR: 2.0-3.0 INR: 2.3  Date: 12/09/2009 Regimen In:  24.0mg /week INR reflects regimen in: 2.3  New  Tablet strength: : 4mg  Regimen Out:     Sunday: 1 Tablet     Monday: 1/2 Tablet   Tuesday: 1 Tablet     Wednesday: 1 Tablet     Thursday: 1/2 Tablet      Friday: 1 Tablet     Saturday: 1 Tablet Total Weekly: 24.0mg /week mg  Next INR Due: 01/13/2010 Adjusted by: [name]. [name] PharmD CACP   Return to anticoagulation clinic:  01/13/2010 Time of next visit: 1030    Allergies: No Known Drug Allergies

## 2010-08-05 NOTE — Assessment & Plan Note (Signed)
Summary: [name]  Anticoagulant Therapy Managed by: [name]. [name]  PharmD CACP Referring MD: [name] [name] MD [facility] Attending: Josem Kaufmann MD, [place] Indication 1: Deep vein thrombus Indication 2: Aftercare long term use Anticoagulants V58.61,V58.83 Start date: 08/06/2000 Duration: Indefinite  Patient Assessment Reviewed by: Chancy Milroy PharmD  February 10, 2010 Medication review: verified warfarin dosage & schedule,verified previous prescription medications, verified doses & any changes, verified new medications, reviewed OTC medications, reviewed OTC health products-vitamins supplements etc Complications: none Dietary changes: none   Health status changes: none   Lifestyle changes: none   Recent/future hospitalizations: none   Recent/future procedures: none   Recent/future dental: none Patient Assessment Part 2:  Have you MISSED ANY DOSES or CHANGED TABLETS?  No missed Warfarin doses or changed tablets.  Have you had any BRUISING or BLEEDING ( nose or gum bleeds,blood in urine or stool)?  No reported bruising or bleeding in nose, gums, urine, stool.  Have you STARTED or STOPPED any MEDICATIONS, including OTC meds,herbals or supplements?  No other medications or herbal supplements were started or stopped.  Have you CHANGED your DIET, especially green vegetables,or ALCOHOL intake?  No changes in diet or alcohol intake.  Have you had any ILLNESSES or HOSPITALIZATIONS?  No reported illnesses or hospitalizations  Have you had any signs of CLOTTING?(chest discomfort,dizziness,shortness of breath,arms tingling,slurred speech,swelling or redness in leg)    No chest discomfort, dizziness, shortness of breath, tingling in arm, slurred speech, swelling, or redness in leg.     Treatment  Target INR: 2.0-3.0 INR: 2.7  Date: 02/10/2010 Regimen In:  24.0mg /week INR reflects regimen in: 2.7  New  Tablet strength: : 4mg  Regimen Out:     Sunday: 1 Tablet     Monday: 1/2 Tablet   Tuesday: 1 Tablet     Wednesday: 1/2 Tablet     Thursday: 1 Tablet      Friday: 1/2 Tablet     Saturday: 1 Tablet Total Weekly: 22.0mg /week mg  Next INR Due: 03/17/2010 Adjusted by: [name]. [name] PharmD CACP   Return to anticoagulation clinic:  03/17/2010 Time of next visit: 1030    Allergies: No Known Drug Allergies

## 2010-08-05 NOTE — Assessment & Plan Note (Signed)
Summary: COU/VS  Anticoagulant Therapy Managed by: Pamela [name]. Pamela [name]  PharmD CACP Referring MD: Pamela [name] [facility] Attending: Margarito Liner MD Indication 1: Deep vein thrombus Indication 2: Aftercare long term use Anticoagulants V58.61,V58.83 Start date: 08/06/2000 Duration: Indefinite  Patient Assessment Reviewed by: Pamela [name] PharmD  January 30, 2008 Medication review: verified warfarin dosage & schedule,verified previous prescription medications, verified doses & any changes, verified new medications, reviewed OTC medications, reviewed OTC health products-vitamins supplements etc Complications: none Dietary changes: none   Health status changes: none   Lifestyle changes: none   Recent/future hospitalizations: none   Recent/future procedures: none   Recent/future dental: none Patient Assessment Part 2:  Have you MISSED ANY DOSES or CHANGED TABLETS?  No missed Warfarin doses or changed tablets.  Have you had any BRUISING or BLEEDING ( nose or gum bleeds,blood in urine or stool)?  No reported bruising or bleeding in nose, gums, urine, stool.  Have you STARTED or STOPPED any MEDICATIONS, including OTC meds,herbals or supplements?  No other medications or herbal supplements were started or stopped.  Have you CHANGED your DIET, especially green vegetables,or ALCOHOL intake?  No changes in diet or alcohol intake.  Have you had any ILLNESSES or HOSPITALIZATIONS?  No reported illnesses or hospitalizations  Have you had any signs of CLOTTING?(chest discomfort,dizziness,shortness of breath,arms tingling,slurred speech,swelling or redness in leg)    No chest discomfort, dizziness, shortness of breath, tingling in arm, slurred speech, swelling, or redness in leg.     Treatment  Target INR: 2.0-3.0 INR: 2.2  Date: 01/30/2008 Regimen In:  22.0mg /week INR reflects regimen in: 2.2  New  Tablet strength: : 4mg  Regimen Out:     Sunday: 1 Tablet     Monday: 1/2 Tablet     Tuesday:  1 Tablet     Wednesday: 1/2 Tablet     Thursday: 1 Tablet      Friday: 1/2 Tablet     Saturday: 1 Tablet Total Weekly: 22.0mg /week mg  Next INR Due: 02/27/2008 Adjusted by: Pamela [name]. Pamela [name] PharmD CACP   Return to anticoagulation clinic:  02/27/2008 Time of next visit: 1100

## 2010-08-05 NOTE — Progress Notes (Signed)
Summary: med refill/gp  Phone Note Refill Request Message from:  Fax from Pharmacy on April 30, 2010 9:51 AM  Refills Requested: Medication #1:  LEVOTHYROXINE SODIUM 112 MCG TABS Take 1 tablet by mouth once a day   Last Refilled: 03/31/2010 Last appt. 04/2009; has an appt. 05/20/10.   Method Requested: Fax to Local Pharmacy Initial call taken by: Chinita Pester RN,  April 30, 2010 9:51 AM    Prescriptions: LEVOTHYROXINE SODIUM 112 MCG TABS (LEVOTHYROXINE SODIUM) Take 1 tablet by mouth once a day  #30 x 0   Entered and Authorized by:   Zoila Shutter MD   Signed by:   Zoila Shutter MD on 04/30/2010   Method used:   Faxed to ...       [facility] (retail)       [address] [place]       [address]       [place], [place]  [address]       Ph: [phone]       Fax: [phone]   RxID:   [id number]

## 2010-08-05 NOTE — Assessment & Plan Note (Signed)
Summary: COU/VS  Anticoagulant Therapy Managed by: [name]. [name]  PharmD CACP Referring MD: [name] [name] MD [facility] Attending: [name] MD Indication 1: Deep vein thrombus Indication 2: Aftercare long term use Anticoagulants V58.61,V58.83 Start date: 08/06/2000 Duration: Indefinite  Patient Assessment Reviewed by: Chancy Milroy PharmD  October 14, 2009 Medication review: verified warfarin dosage & schedule,verified previous prescription medications, verified doses & any changes, verified new medications, reviewed OTC medications, reviewed OTC health products-vitamins supplements etc Complications: none Dietary changes: none   Health status changes: none   Lifestyle changes: none   Recent/future hospitalizations: none   Recent/future procedures: none   Recent/future dental: none Patient Assessment Part 2:  Have you MISSED ANY DOSES or CHANGED TABLETS?  No missed Warfarin doses or changed tablets.  Have you had any BRUISING or BLEEDING ( nose or gum bleeds,blood in urine or stool)?  No reported bruising or bleeding in nose, gums, urine, stool.  Have you STARTED or STOPPED any MEDICATIONS, including OTC meds,herbals or supplements?  No other medications or herbal supplements were started or stopped.  Have you CHANGED your DIET, especially green vegetables,or ALCOHOL intake?  No changes in diet or alcohol intake.  Have you had any ILLNESSES or HOSPITALIZATIONS?  No reported illnesses or hospitalizations  Have you had any signs of CLOTTING?(chest discomfort,dizziness,shortness of breath,arms tingling,slurred speech,swelling or redness in leg)    No chest discomfort, dizziness, shortness of breath, tingling in arm, slurred speech, swelling, or redness in leg.     Treatment  Target INR: 2.0-3.0 INR: 1.7  Date: 10/14/2009 Regimen In:  20.0mg /week INR reflects regimen in: 1.7  New  Tablet strength: : 4mg  Regimen Out:     Sunday: 1 Tablet     Monday: 1/2 Tablet  Tuesday: 1 Tablet     Wednesday: 1/2 Tablet     Thursday: 1 Tablet      Friday: 1/2 Tablet     Saturday: 1 Tablet Total Weekly: 22.0mg /week mg  Next INR Due: 11/11/2009 Adjusted by: [name]. [name] PharmD CACP   Return to anticoagulation clinic:  11/11/2009 Time of next visit: 1030    Allergies: No Known Drug Allergies

## 2010-08-07 NOTE — Miscellaneous (Signed)
Summary: Pamela [name]   Imported By: Louretta Parma 06/24/2010 16:42:38  _____________________________________________________________________  External Attachment:    Type:   Image     Comment:   External Document

## 2010-08-07 NOTE — Assessment & Plan Note (Signed)
Summary: FU/SB.   Vital Signs:  Patient profile:   65 year old female Height:      64.5 inches (163.83 cm) Weight:      191.5 pounds (87.05 kg) BMI:     32.48 Temp:     98.0 degrees F (36.67 degrees C) oral Pulse rate:   68 / minute BP sitting:   132 / 90  (left arm) Cuff size:   regular  Vitals Entered By: Cynda Familia Duncan Dull) (June 24, 2010 10:30 AM) CC: Depression Nutritional Status BMI of > 30 = obese   CC:  Depression.  History of Present Illness: 65 yr old who comes in for follow up of:  1. Depression:  She is feeling better. She says it wasn't "dramatic" because she thinks she wasn't too depressed. Her EMA, being introverted, and not wanting to go out are all better. She has not yet started back with exercise.  2. Hypothyroidism: She was on too much replacement and has felt no different on a lower dose.  3. Elevated BP without diagnosis of hypertension.  Depression History:      The patient denies a depressed mood most of the day and a diminished interest in her usual daily activities.        Comments:  mood has improved since starting.   Current Medications (verified): 1)  Levothyroxine Sodium 100 Mcg Tabs (Levothyroxine Sodium) .... Take One A Day 2)  Daily Combo Multivits/calcium Tabs (Multiple Vitamins-Calcium) .... Take 1 Tablet By Mouth Once A Day 3)  Warfarin Sodium  Tabs (Warfarin Sodium Tabs) .... Tablet Strength: 4mg  Take As Directed 4)  Lumigan 0.03 % Soln (Bimatoprost) .... Once Daily 5)  Sertraline Hcl 50 Mg Tabs (Sertraline Hcl) .... Take One A Day in The Morning  Allergies (verified): No Known Drug Allergies  Past History:  Past Medical History: Reviewed history from 04/21/2006 and no changes required. 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 etiology ([place]rill) Hyperlipidemia, mild - HDL 85 Knee pain (Murphy/Wainer) Cholelithiasis with obstruction - s/p  ERCP,sphincterotomy, stent (Magod) Tenosynovitis, finger - s/p sgy (Sypher) 7/05  Family History: Reviewed history from 05/21/2010 and no changes required. Father lived to 4 yo Mother died of H&N cancer (smoker) Sister - lung cancer found early, 2011  Social History: Reviewed history from 04/24/2008 and no changes required. Married Former Smoker- smoked for  ~13yrs (quit 1988) Drug use-no Regular exercise-no  Review of Systems General:  Denies chills, fatigue, fever, loss of appetite, sweats, and weakness. CV:  Denies chest pain or discomfort and palpitations. Resp:  Denies cough and shortness of breath. GI:  Denies abdominal pain.  Physical Exam  General:  BP to my repeat is 132/90 -> 130/88, alert and well-developed.   Head:  normocephalic and atraumatic.   Eyes:  vision grossly intact.   Neck:  supple, full ROM, and no masses.   Lungs:  normal respiratory effort and normal breath sounds.   Heart:  normal rate, regular rhythm, and no murmur.   Abdomen:  soft, non-tender, and normal bowel sounds.   Extremities:  no edema Psych:  Oriented X3, memory intact for recent and remote, and not depressed appearing.     Impression & Recommendations:  Problem # 1:  DEPRESSION (ICD-311) She is doing well. Continue present medication. Her updated medication list for this problem includes:    Sertraline Hcl 50 Mg Tabs (Sertraline hcl) .[name]... Take one a day in  the morning  Problem # 2:  ELEVATED BLOOD PRESSURE WITHOUT DIAGNOSIS OF HYPERTENSION (ICD-796.2) I am more concerned about this today as the diastolic is 90. I will have her come in for follow up in 6 weeks and if it is still up will probably begin HCTZ. She has not restarted with exercise but she does not want to be on medication for HTN so this will probably motivate her to begin again.  Problem # 3:  HYPOTHYROIDISM (ICD-244.9) As Mrs. Abbasi has only been on the new dose of thyroid replacement for 4 weeks, will wait until she  returns to recheck TSH. Her updated medication list for this problem includes:    Levothyroxine Sodium 100 Mcg Tabs (Levothyroxine sodium) .[name]... Take one a day  Problem # 4:  FATIGUE (ICD-780.79) This has improved on sertaline. Will follow.  Complete Medication List: 1)  Levothyroxine Sodium 100 Mcg Tabs (Levothyroxine sodium) .... Take one a day 2)  Daily Combo Multivits/calcium Tabs (Multiple vitamins-calcium) .... Take 1 tablet by mouth once a day 3)  Warfarin Sodium Tabs (Warfarin sodium tabs) .... Tablet strength: 4mg  take as directed 4)  Lumigan 0.03 % Soln (Bimatoprost) .... Once daily 5)  Sertraline Hcl 50 Mg Tabs (Sertraline hcl) .... Take one a day in the morning  Patient Instructions: 1)  Please schedule a follow-up appointment in 6 weeks. 2)  Happy exercisisng.   Orders Added: 1)  Est. Patient Level IV [29528]     Prevention & Chronic Care Immunizations   Influenza vaccine: Historical  (03/06/2010)   Influenza vaccine deferral: Deferred  (04/09/2009)    Tetanus booster: 02/16/2007: given   Tetanus booster due: 02/15/2017    Pneumococcal vaccine: Pneumovax  (04/09/2009)    H. zoster vaccine: Not documented  Colorectal Screening   Hemoccult: negative x 3 (per pt.)  (04/04/2009)    Colonoscopy:  Results: Hemorrhoids.     Location:  [facility].     (06/04/2003)   Colonoscopy action/deferral: Repeat colonoscopy in 5 years.   (06/04/2003)   Colonoscopy due: 06/03/2013  Other Screening   Pap smear: normal exam by Dr. Teodora Medici, gyn  (04/04/2009)    Mammogram: No specific mammographic evidence of malignancy.  Assessment: BIRADS 1. Location: [facility].     (10/18/2008)   Mammogram action/deferral: Screening mammogram in 1 year.     (10/18/2008)   Mammogram due: 10/2008    DXA bone density scan: Lumbar Spine:  T Score > -1.0 Spine (-0.3).  Left Hip Total: T Score -2.5 to -1.0 Hip (-1.5).   No significant change from 09/30/06  study. Location:  [facility].      (10/18/2008)   DXA scan due: 10/2008    Smoking status: quit  (05/21/2010)  Lipids   Total Cholesterol: 236  (11/06/2008)   LDL: 137  (11/06/2008)   LDL Direct: Not documented   HDL: 82  (11/06/2008)   Triglycerides: 83  (11/06/2008)    SGOT (AST): Not documented   SGPT (ALT): Not documented   Alkaline phosphatase: Not documented   Total bilirubin: Not documented  Self-Management Support :    Patient will work on the following items until the next clinic visit to reach self-care goals:     Medications and monitoring: take my medicines every day  (06/24/2010)    Lipid self-management support: Written self-care plan  (06/24/2010)   Lipid self-care plan printed.

## 2010-08-07 NOTE — Assessment & Plan Note (Signed)
Summary: [name]  Anticoagulant Therapy Managed by: [name]. [name]  PharmD CACP Referring MD: [name] Woodyear MD Indication 1: Deep vein thrombus Indication 2: Aftercare long term use Anticoagulants V58.61,V58.83 Start date: 08/06/2000 Duration: Indefinite  Patient Assessment Reviewed by: Chancy Milroy PharmD  June 23, 2010 Medication review: verified warfarin dosage & schedule,verified previous prescription medications, verified doses & any changes, verified new medications, reviewed OTC medications, reviewed OTC health products-vitamins supplements etc Complications: none Dietary changes: none   Health status changes: none   Lifestyle changes: none   Recent/future hospitalizations: none   Recent/future procedures: none   Recent/future dental: none Patient Assessment Part 2:  Have you MISSED ANY DOSES or CHANGED TABLETS?  No missed Warfarin doses or changed tablets.  Have you had any BRUISING or BLEEDING ( nose or gum bleeds,blood in urine or stool)?  No reported bruising or bleeding in nose, gums, urine, stool.  Have you STARTED or STOPPED any MEDICATIONS, including OTC meds,herbals or supplements?  No other medications or herbal supplements were started or stopped.  Have you CHANGED your DIET, especially green vegetables,or ALCOHOL intake?  No changes in diet or alcohol intake.  Have you had any ILLNESSES or HOSPITALIZATIONS?  No reported illnesses or hospitalizations  Have you had any signs of CLOTTING?(chest discomfort,dizziness,shortness of breath,arms tingling,slurred speech,swelling or redness in leg)    No chest discomfort, dizziness, shortness of breath, tingling in arm, slurred speech, swelling, or redness in leg.     Treatment  Target INR: 2.0-3.0 INR: 2.0  Date: 06/23/2010 Regimen In:  20.0mg /week INR reflects regimen in: 2.0  New  Tablet strength: : 4mg  Regimen Out:     Sunday: 1 Tablet     Monday: 1/2 Tablet     Tuesday: 1 Tablet  Wednesday: 1/2 Tablet     Thursday: 1 Tablet      Friday: 1/2 Tablet     Saturday: 1 Tablet Total Weekly: 22.0mg /week mg  Next INR Due: 07/21/2010 Adjusted by: [name]. [name] PharmD CACP   Return to anticoagulation clinic:  07/21/2010 Time of next visit: 1000    Allergies: No Known Drug Allergies

## 2010-08-13 NOTE — Assessment & Plan Note (Signed)
Summary: [name]  Anticoagulant Therapy Managed by: [name]. Pamela [name]  PharmD CACP Referring MD: [name] [name] MD [facility] Attending: [name] MD Indication 1: Deep vein thrombus Indication 2: Aftercare long term use Anticoagulants V58.61,V58.83 Start date: 08/06/2000 Duration: Indefinite  Patient Assessment Medication review: verified warfarin dosage & schedule,verified previous prescription medications, verified doses & any changes, verified new medications, reviewed OTC medications, reviewed OTC health products-vitamins supplements etc Complications: none Dietary changes: none   Health status changes: none   Lifestyle changes: none   Recent/future hospitalizations: none   Recent/future procedures: none   Recent/future dental: none Patient Assessment Part 2:  Have you MISSED ANY DOSES or CHANGED TABLETS?  No missed Warfarin doses or changed tablets.  Have you had any BRUISING or BLEEDING ( nose or gum bleeds,blood in urine or stool)?  No reported bruising or bleeding in nose, gums, urine, stool.  Have you STARTED or STOPPED any MEDICATIONS, including OTC meds,herbals or supplements?  No other medications or herbal supplements were started or stopped.  Have you CHANGED your DIET, especially green vegetables,or ALCOHOL intake?  No changes in diet or alcohol intake.  Have you had any ILLNESSES or HOSPITALIZATIONS?  No reported illnesses or hospitalizations  Have you had any signs of CLOTTING?(chest discomfort,dizziness,shortness of breath,arms tingling,slurred speech,swelling or redness in leg)    No chest discomfort, dizziness, shortness of breath, tingling in arm, slurred speech, swelling, or redness in leg.     Treatment  Target INR: 2.0-3.0 INR: 2.3  Date: 07/28/2010 Regimen In:  22.0mg /week INR reflects regimen in: 2.3  New  Tablet strength: : 4mg  Regimen Out:     Sunday: 1 Tablet     Monday: 1/2 Tablet     Tuesday: 1 Tablet     Wednesday: 1/2 Tablet    Thursday: 1 Tablet      Friday: 1/2 Tablet     Saturday: 1 Tablet Total Weekly: 22.0mg /week mg  Next INR Due: 08/25/2010 Adjusted by: [name]. [name] PharmD CACP   Return to anticoagulation clinic:  08/25/2010 Time of next visit: 1000    Allergies: No Known Drug Allergies

## 2010-08-15 ENCOUNTER — Encounter: Payer: Self-pay | Admitting: Internal Medicine

## 2010-08-19 ENCOUNTER — Encounter: Payer: Self-pay | Admitting: Internal Medicine

## 2010-08-19 ENCOUNTER — Ambulatory Visit (INDEPENDENT_AMBULATORY_CARE_PROVIDER_SITE_OTHER): Payer: BC Managed Care – PPO | Admitting: Internal Medicine

## 2010-08-19 DIAGNOSIS — R5381 Other malaise: Secondary | ICD-10-CM

## 2010-08-19 DIAGNOSIS — R030 Elevated blood-pressure reading, without diagnosis of hypertension: Secondary | ICD-10-CM

## 2010-08-19 DIAGNOSIS — E039 Hypothyroidism, unspecified: Secondary | ICD-10-CM

## 2010-08-19 DIAGNOSIS — F329 Major depressive disorder, single episode, unspecified: Secondary | ICD-10-CM

## 2010-08-19 DIAGNOSIS — F3289 Other specified depressive episodes: Secondary | ICD-10-CM

## 2010-08-19 DIAGNOSIS — R5383 Other fatigue: Secondary | ICD-10-CM

## 2010-08-19 MED ORDER — SERTRALINE HCL 50 MG PO TABS: 50.0000 mg | ORAL_TABLET | Freq: Every day | ORAL | Status: DC

## 2010-08-19 MED ORDER — LEVOTHYROXINE SODIUM 100 MCG PO TABS: 100.0000 ug | ORAL_TABLET | Freq: Every day | ORAL | Status: DC

## 2010-08-19 NOTE — Assessment & Plan Note (Signed)
I will check TSH and free t4 today to see if she is on the right dose. Her weight is up a bit but she has otherwise felt fine with decreasing the dose and denies other symptoms. I have refilled her levothryroid for 90 days at a time.

## 2010-08-19 NOTE — Assessment & Plan Note (Signed)
Blood pressure today is better. I was concerned when she was last in as her diastolic blood pressure was 90 but that is down today. Will continue to follow.

## 2010-08-19 NOTE — Progress Notes (Signed)
  Subjective:    Patient ID: Pamela [name], female    DOB: Nov 09, 1945, 65 y.o.   MRN: [id number]  HPI 65 yr old who comes in for follow up of hypothyroidism on new dose of medication, situational depressed state surrounding  retirement, elevated BP without diagnosis of hypertension, and fatigue. She is doing well. She has not gotten back to her regular exercise at the [facility] but she is walking some. She does feel less fatigued.       Review of Systems  Constitutional: Negative for fatigue and unexpected weight change.  Respiratory: Negative for shortness of breath.   Gastrointestinal: Negative for nausea, vomiting and abdominal pain.       Objective:   Physical Exam  Constitutional: She appears well-developed. No distress.  HENT:  Head: Normocephalic and atraumatic.  Cardiovascular: Normal rate, regular rhythm and normal heart sounds.   No murmur heard. Pulmonary/Chest: Breath sounds normal.  Abdominal: Soft. Bowel sounds are normal. There is no tenderness.  Musculoskeletal: Normal range of motion. She exhibits no edema.  Skin: Skin is warm and dry.  Psychiatric: She has a normal mood and affect.          Assessment & Plan:

## 2010-08-19 NOTE — Assessment & Plan Note (Signed)
This is improved and is probably mutlifactorial. Continue to encourage exercise, continue sertaline and current levothyroid dose.

## 2010-08-19 NOTE — Patient Instructions (Signed)
Have a great time on the cruise! All the best with getting back to exercise. I will see you in 4 months! Please come fasting when you return, nothing to eat after midnight. You may drink water and black coffee.

## 2010-08-19 NOTE — Assessment & Plan Note (Signed)
Pamela [name] is doing well from this standpoint. I have refilled the sertaline for 90 days with refills. I will plan to see her back in 4 months and at that time may consider a trial of tapering off of sertaline.

## 2010-08-25 ENCOUNTER — Ambulatory Visit: Payer: Self-pay

## 2010-09-01 ENCOUNTER — Ambulatory Visit (INDEPENDENT_AMBULATORY_CARE_PROVIDER_SITE_OTHER): Payer: Medicare Other | Admitting: Pharmacist

## 2010-09-01 DIAGNOSIS — D682 Hereditary deficiency of other clotting factors: Secondary | ICD-10-CM

## 2010-09-01 DIAGNOSIS — Z7901 Long term (current) use of anticoagulants: Secondary | ICD-10-CM

## 2010-09-01 DIAGNOSIS — Z86718 Personal history of other venous thrombosis and embolism: Secondary | ICD-10-CM

## 2010-09-01 LAB — POCT INR: INR: 2.2

## 2010-09-01 NOTE — Patient Instructions (Signed)
Patient instructed to take medications as defined in the Anti-coagulation Track section of this encounter.  Patient instructed to take today's dose.  Patient verbalized understanding of these instructions.    

## 2010-09-01 NOTE — Progress Notes (Signed)
Anti-Coagulation Progress Note  Pamela [name] is a 65 y.o. female who is currently on an anti-coagulation regimen.    RECENT RESULTS: Recent results are below, the most recent result is correlated with a dose of 22 mg. per week: Lab Results  Component Value Date   INR 2.2 09/01/2010   INR 2.3 07/28/2010   INR 2.0 06/23/2010    ANTI-COAG DOSE:   Latest dosing instructions   Total Sun Mon Tue Wed Thu Fri Sat   22 4 mg 2 mg 4 mg 2 mg 4 mg 2 mg 4 mg    (4 mg1) (4 mg0.5) (4 mg1) (4 mg0.5) (4 mg1) (4 mg0.5) (4 mg1)         ANTICOAG SUMMARY: Anticoagulation Episode Summary              Current INR goal 2.0-3.0 Next INR check 09/29/2010   INR from last check 2.2 (09/01/2010)     Weekly max dose (mg)  Target end date    Indications FACTOR II DEFICIENCY, DVT, HX OF (Resolved), Long term current use of anticoagulant   [facility] check [facility]    Send INR reminders to [facility]   Comments        Provider Role Specialty Phone number   [name] [facility]  Internal Medicine [phone]        ANTICOAG TODAY: Anticoagulation Summary as of 09/01/2010              INR goal 2.0-3.0     Selected INR 2.2 (09/01/2010) Next INR check 09/29/2010   Weekly max dose (mg)  Target end date    Indications FACTOR II DEFICIENCY, DVT, HX OF (Resolved), Long term current use of anticoagulant    Anticoagulation Episode Summary              INR check location [facility] Preferred lab    Send INR reminders to [facility]   Comments        Provider Role Specialty Phone number   [name] [facility]  Internal Medicine [phone]        PATIENT INSTRUCTIONS: Patient Instructions  Patient instructed to take medications as defined in the Anti-coagulation Track section of this encounter.  Patient instructed to take today's dose.  Patient verbalized understanding of these instructions.        FOLLOW-UP Return in 4 weeks (on 09/29/2010) for Follow up  INR.  Hulen Luster, III Pharm.D., CACP

## 2010-09-29 ENCOUNTER — Ambulatory Visit: Payer: Medicare Other

## 2010-10-06 ENCOUNTER — Ambulatory Visit: Payer: Medicare Other

## 2010-10-06 ENCOUNTER — Ambulatory Visit (INDEPENDENT_AMBULATORY_CARE_PROVIDER_SITE_OTHER): Payer: Medicare Other | Admitting: Pharmacist

## 2010-10-06 DIAGNOSIS — Z86718 Personal history of other venous thrombosis and embolism: Secondary | ICD-10-CM

## 2010-10-06 DIAGNOSIS — Z7901 Long term (current) use of anticoagulants: Secondary | ICD-10-CM

## 2010-10-06 DIAGNOSIS — D682 Hereditary deficiency of other clotting factors: Secondary | ICD-10-CM

## 2010-10-06 MED ORDER — WARFARIN SODIUM 4 MG PO TABS: ORAL_TABLET | ORAL | Status: DC

## 2010-10-06 NOTE — Patient Instructions (Signed)
Anti-Coagulation Progress Note  Pamela [name] is a 65 y.o. female who is currently on an anti-coagulation regimen.    RECENT RESULTS: Recent results are below, the most recent result is correlated with a dose of 22 mg. per week: Lab Results  Component Value Date   INR 3.2 10/06/2010   INR 2.2 09/01/2010   INR 2.3 07/28/2010    ANTI-COAG DOSE:   Latest dosing instructions   Total Sun Mon Tue Wed Thu Fri Sat   20 2 mg 4 mg 2 mg 4 mg 2 mg 4 mg 2 mg    (4 mg0.5) (4 mg1) (4 mg0.5) (4 mg1) (4 mg0.5) (4 mg1) (4 mg0.5)         ANTICOAG SUMMARY: @ANTICOAGSUMMARY @  ANTICOAG TODAY: @ANTICOAGTODAY @  PATIENT INSTRUCTIONS: @PATINSTR @   FOLLOW-UP No Follow-up on file.  Hulen Luster, III Pharm.D., CACP

## 2010-10-06 NOTE — Progress Notes (Signed)
Anti-Coagulation Progress Note  Pamela [name] is a 65 y.o. female who is currently on an anti-coagulation regimen.    RECENT RESULTS: Recent results are below, the most recent result is correlated with a dose of 22 mg. per week: Lab Results  Component Value Date   INR 3.2 10/06/2010   INR 2.2 09/01/2010   INR 2.3 07/28/2010    ANTI-COAG DOSE:   Latest dosing instructions   Total Sun Mon Tue Wed Thu Fri Sat   20 2 mg 4 mg 2 mg 4 mg 2 mg 4 mg 2 mg    (4 mg0.5) (4 mg1) (4 mg0.5) (4 mg1) (4 mg0.5) (4 mg1) (4 mg0.5)         ANTICOAG SUMMARY: Anticoagulation Episode Summary              Current INR goal 2.0-3.0 Next INR check 11/03/2010   INR from last check 3.2! (10/06/2010)     Weekly max dose (mg)  Target end date Indefinite   Indications FACTOR II DEFICIENCY, DVT, HX OF (Resolved), Long term current use of anticoagulant   [facility]    Send INR reminders to [facility]   Comments        Provider Role Specialty Phone number   [name] [facility]  Internal Medicine [phone]        ANTICOAG TODAY: Anticoagulation Summary as of 10/06/2010              INR goal 2.0-3.0     Selected INR 3.2! (10/06/2010) Next INR check 11/03/2010   Weekly max dose (mg)  Target end date Indefinite   Indications FACTOR II DEFICIENCY, DVT, HX OF (Resolved), Long term current use of anticoagulant    Anticoagulation Episode Summary              INR check location [facility] Pref[facility]    Send INR reminders to [facility]   Comments        Provider Role Specialty Phone number   [name] [facility]  Internal Medicine [phone]        PATIENT INSTRUCTIONS: Patient Instructions   Anti-Coagulation Progress Note  Pamela [name] is a 65 y.o. female who is currently on an anti-coagulation regimen.    RECENT RESULTS: Recent results are below, the most recent result is correlated with a dose of 22 mg. per week: Lab Results  Component  Value Date   INR 3.2 10/06/2010   INR 2.2 09/01/2010   INR 2.3 07/28/2010    ANTI-COAG DOSE:   Latest dosing instructions   Total Sun Mon Tue Wed Thu Fri Sat   20 2 mg 4 mg 2 mg 4 mg 2 mg 4 mg 2 mg    (4 mg0.5) (4 mg1) (4 mg0.5) (4 mg1) (4 mg0.5) (4 mg1) (4 mg0.5)         ANTICOAG SUMMARY: @ANTICOAGSUMMARY @  ANTICOAG TODAY: @ANTICOAGTODAY @  PATIENT INSTRUCTIONS: @PATINSTR @   FOLLOW-UP No Follow-up on file.  Hulen Luster, III Pharm.D., CACP       FOLLOW-UP Return in 4 weeks (on 11/03/2010) for Follow up INR.  Hulen Luster, III Pharm.D., CACP

## 2010-10-28 ENCOUNTER — Inpatient Hospital Stay (INDEPENDENT_AMBULATORY_CARE_PROVIDER_SITE_OTHER)
Admission: RE | Admit: 2010-10-28 | Discharge: 2010-10-28 | Disposition: A | Discharge status: Home / Self Care | Payer: Medicare Other | Source: Ambulatory Visit

## 2010-10-28 ENCOUNTER — Encounter (HOSPITAL_BASED_OUTPATIENT_CLINIC_OR_DEPARTMENT_OTHER): Payer: Medicare Other | Attending: General Surgery

## 2010-10-28 DIAGNOSIS — Z79899 Other long term (current) drug therapy: Secondary | ICD-10-CM | POA: Insufficient documentation

## 2010-10-28 DIAGNOSIS — Z7901 Long term (current) use of anticoagulants: Secondary | ICD-10-CM | POA: Insufficient documentation

## 2010-10-28 DIAGNOSIS — X19XXXA Contact with other heat and hot substances, initial encounter: Secondary | ICD-10-CM | POA: Insufficient documentation

## 2010-10-28 DIAGNOSIS — T2000XA Burn of unspecified degree of head, face, and neck, unspecified site, initial encounter: Secondary | ICD-10-CM

## 2010-10-28 DIAGNOSIS — E039 Hypothyroidism, unspecified: Secondary | ICD-10-CM | POA: Insufficient documentation

## 2010-10-28 DIAGNOSIS — Z86718 Personal history of other venous thrombosis and embolism: Secondary | ICD-10-CM | POA: Insufficient documentation

## 2010-10-28 DIAGNOSIS — T2027XA Burn of second degree of neck, initial encounter: Secondary | ICD-10-CM | POA: Insufficient documentation

## 2010-11-03 ENCOUNTER — Ambulatory Visit (INDEPENDENT_AMBULATORY_CARE_PROVIDER_SITE_OTHER): Payer: Medicare Other | Admitting: Pharmacist

## 2010-11-03 DIAGNOSIS — Z86718 Personal history of other venous thrombosis and embolism: Secondary | ICD-10-CM

## 2010-11-03 DIAGNOSIS — D682 Hereditary deficiency of other clotting factors: Secondary | ICD-10-CM

## 2010-11-03 DIAGNOSIS — Z7901 Long term (current) use of anticoagulants: Secondary | ICD-10-CM

## 2010-11-03 LAB — POCT INR: INR: 2.2

## 2010-11-03 NOTE — Progress Notes (Signed)
Anti-Coagulation Progress Note  Pamela [name] is a 65 y.o. female who is currently on an anti-coagulation regimen.    RECENT RESULTS: Recent results are below, the most recent result is correlated with a dose of 20 mg. per week: Lab Results  Component Value Date   INR 2.2 11/03/2010   INR 3.2 10/06/2010   INR 2.2 09/01/2010    ANTI-COAG DOSE:   Latest dosing instructions   Total Sun Mon Tue Wed Thu Fri Sat   20 2 mg 4 mg 2 mg 4 mg 2 mg 4 mg 2 mg    (4 mg0.5) (4 mg1) (4 mg0.5) (4 mg1) (4 mg0.5) (4 mg1) (4 mg0.5)         ANTICOAG SUMMARY: Anticoagulation Episode Summary              Current INR goal 2.0-3.0 Next INR check 12/15/2010   INR from last check 2.2 (11/03/2010)     Weekly max dose (mg)  Target end date Indefinite   Indications FACTOR II DEFICIENCY, DVT, HX OF (Resolved), Long term current use of anticoagulant   [facility]    Send INR reminders to [facility]   Comments        Provider Role Specialty Phone number   [name] [facility]  Internal Medicine [phone]        ANTICOAG TODAY: Anticoagulation Summary as of 11/03/2010              INR goal 2.0-3.0     Selected INR 2.2 (11/03/2010) Next INR check 12/15/2010   Weekly max dose (mg)  Target end date Indefinite   Indications FACTOR II DEFICIENCY, DVT, HX OF (Resolved), Long term current use of anticoagulant    Anticoagulation Episode Summary              INR check location [facility] Pref[facility]    Send INR reminders to [facility]   Comments        Provider Role Specialty Phone number   [name] [facility]  Internal Medicine [phone]        PATIENT INSTRUCTIONS: Patient Instructions   Anti-Coagulation Progress Note  Pamela [name] is a 65 y.o. female who is currently on an anti-coagulation regimen.    RECENT RESULTS: Recent results are below, the most recent result is correlated with a dose of 20 mg. per week: Lab Results  Component  Value Date   INR 2.2 11/03/2010   INR 3.2 10/06/2010   INR 2.2 09/01/2010    ANTI-COAG DOSE:   Latest dosing instructions   Total Sun Mon Tue Wed Thu Fri Sat   20 2 mg 4 mg 2 mg 4 mg 2 mg 4 mg 2 mg    (4 mg0.5) (4 mg1) (4 mg0.5) (4 mg1) (4 mg0.5) (4 mg1) (4 mg0.5)         ANTICOAG SUMMARY: @ANTICOAGSUMMARY @  ANTICOAG TODAY: @ANTICOAGTODAY @  PATIENT INSTRUCTIONS: @PATINSTR @   FOLLOW-UP No Follow-up on file.  Hulen Luster, III Pharm.D., CACP   Patient instructed to take medications as defined in the Anti-coagulation Track section of this encounter.  Patient instructed to take today's dose.  Patient verbalized understanding of these instructions.        FOLLOW-UP Return in 6 weeks (on 12/15/2010) for Follow up INR.  Hulen Luster, III Pharm.D., CACP

## 2010-11-03 NOTE — Patient Instructions (Addendum)
Anti-Coagulation Progress Note  Pamela [name] is a 65 y.o. female who is currently on an anti-coagulation regimen.    RECENT RESULTS: Recent results are below, the most recent result is correlated with a dose of 20 mg. per week: Lab Results  Component Value Date   INR 2.2 11/03/2010   INR 3.2 10/06/2010   INR 2.2 09/01/2010    ANTI-COAG DOSE:   Latest dosing instructions   Total Sun Mon Tue Wed Thu Fri Sat   20 2 mg 4 mg 2 mg 4 mg 2 mg 4 mg 2 mg    (4 mg0.5) (4 mg1) (4 mg0.5) (4 mg1) (4 mg0.5) (4 mg1) (4 mg0.5)         ANTICOAG SUMMARY: @ANTICOAGSUMMARY @  ANTICOAG TODAY: @ANTICOAGTODAY @  PATIENT INSTRUCTIONS: @PATINSTR @   FOLLOW-UP No Follow-up on file.  Hulen Luster, III Pharm.D., CACP   Patient instructed to take medications as defined in the Anti-coagulation Track section of this encounter.  Patient instructed to take today's dose.  Patient verbalized understanding of these instructions.

## 2010-11-03 NOTE — Progress Notes (Deleted)
Anti-Coagulation Progress Note  [name] is a 65 y.o. female who is currently on an anti-coagulation regimen.    RECENT RESULTS: Recent results are below, the most recent result is correlated with a dose of *** mg. per week: Lab Results  Component Value Date   INR 2.2 11/03/2010   INR 3.2 10/06/2010   INR 2.2 09/01/2010    ANTI-COAG DOSE:   Latest dosing instructions   Total Sun Mon Tue Wed Thu Fri Sat   20 2 mg 4 mg 2 mg 4 mg 2 mg 4 mg 2 mg    (4 mg0.5) (4 mg1) (4 mg0.5) (4 mg1) (4 mg0.5) (4 mg1) (4 mg0.5)         ANTICOAG SUMMARY: Anticoagulation Episode Summary              Current INR goal 2.0-3.0 Next INR check 12/15/2010   INR from last check 2.2 (11/03/2010)     Weekly max dose (mg)  Target end date Indefinite   Indications FACTOR II DEFICIENCY, DVT, HX OF (Resolved), Long term current use of anticoagulant   INR check location [facility] Preferred lab    Send INR reminders to [facility]   Comments        Provider Role Specialty Phone number   [name] [facility]  Internal Medicine [phone]        ANTICOAG TODAY: Anticoagulation Summary as of 11/03/2010              INR goal 2.0-3.0     Selected INR 2.2 (11/03/2010) Next INR check 12/15/2010   Weekly max dose (mg)  Target end date Indefinite   Indications FACTOR II DEFICIENCY, DVT, HX OF (Resolved), Long term current use of anticoagulant    Anticoagulation Episode Summary              INR check location [facility] Preferred lab    Send INR reminders to [facility]   Comments        Provider Role Specialty Phone number   [name] [facility]  Internal Medicine [phone]        PATIENT INSTRUCTIONS: Patient Instructions   Anti-Coagulation Progress Note  Pamela [name] is a 65 y.o. female who is currently on an anti-coagulation regimen.    RECENT RESULTS: Recent results are below, the most recent result is correlated with a dose of 20 mg. per week: Lab Results  Component  Value Date   INR 2.2 11/03/2010   INR 3.2 10/06/2010   INR 2.2 09/01/2010    ANTI-COAG DOSE:   Latest dosing instructions   Total Sun Mon Tue Wed Thu Fri Sat   20 2 mg 4 mg 2 mg 4 mg 2 mg 4 mg 2 mg    (4 mg0.5) (4 mg1) (4 mg0.5) (4 mg1) (4 mg0.5) (4 mg1) (4 mg0.5)         ANTICOAG SUMMARY: @ANTICOAGSUMMARY @  ANTICOAG TODAY: @ANTICOAGTODAY @  PATIENT INSTRUCTIONS: @PATINSTR @   FOLLOW-UP No Follow-up on file.  Hulen Luster, III Pharm.D., CACP       FOLLOW-UP Return in 6 weeks (on 12/15/2010) for Follow up INR.  Hulen Luster, III Pharm.D., CACP

## 2010-11-05 ENCOUNTER — Ambulatory Visit: Payer: Medicare Other

## 2010-11-05 ENCOUNTER — Encounter (HOSPITAL_BASED_OUTPATIENT_CLINIC_OR_DEPARTMENT_OTHER): Payer: Medicare Other | Attending: General Surgery

## 2010-11-05 DIAGNOSIS — T2027XA Burn of second degree of neck, initial encounter: Secondary | ICD-10-CM | POA: Insufficient documentation

## 2010-11-05 DIAGNOSIS — X19XXXA Contact with other heat and hot substances, initial encounter: Secondary | ICD-10-CM | POA: Insufficient documentation

## 2010-11-05 DIAGNOSIS — Z79899 Other long term (current) drug therapy: Secondary | ICD-10-CM | POA: Insufficient documentation

## 2010-11-05 DIAGNOSIS — Z86718 Personal history of other venous thrombosis and embolism: Secondary | ICD-10-CM | POA: Insufficient documentation

## 2010-11-05 DIAGNOSIS — Z7901 Long term (current) use of anticoagulants: Secondary | ICD-10-CM | POA: Insufficient documentation

## 2010-11-05 DIAGNOSIS — E039 Hypothyroidism, unspecified: Secondary | ICD-10-CM | POA: Insufficient documentation
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
## 2010-11-19 ENCOUNTER — Other Ambulatory Visit (HOSPITAL_BASED_OUTPATIENT_CLINIC_OR_DEPARTMENT_OTHER): Payer: Self-pay | Admitting: General Surgery

## 2010-11-19 LAB — GLUCOSE, CAPILLARY: Glucose-Capillary: 107 mg/dL — ABNORMAL HIGH (ref 70–99)

## 2010-11-21 NOTE — Op Note (Signed)
[name], [name]             ACCOUNT NO.:  [id number]   MEDICAL RECORD NO.:  [id number]          PATIENT TYPE:  AMB   LOCATION:  DSC                          FACILITY:  [facility]   PHYSICIAN:  Loreta Ave, M.D. DATE OF BIRTH:  1945/07/27   DATE OF PROCEDURE:  08/05/2006  DATE OF DISCHARGE:                               OPERATIVE REPORT   PREOPERATIVE DIAGNOSIS:  Displaced trimalleolar ankle fracture, right.   POSTOPERATIVE DIAGNOSIS:  Displaced trimalleolar ankle fracture, right.   PROCEDURES:  Open reduction, internal fixation, right ankle fracture  with a five-hole one third tubular plate lateral.  Repair of deltoid  ligament back to medial malleolus with a 5.5 mm Corkscrew and FiberWire  suture.  Closed treatment of posterior fracture.   SURGEON:  Loreta Ave, M.D.   ASSISTANT:  Genene Churn. Barry Dienes, PA   ANESTHESIA:  General.   ESTIMATED BLOOD LOSS:  Minimal.   TOURNIQUET TIME:  1 hour.   SPECIMENS:  None.   CULTURES:  None.   COMPLICATIONS:  None.   DRESSING:  Self-compressive, with short leg splint.   PROCEDURE:  The patient was brought to the operating room and after  adequate anesthesia had been obtained, splints were removed.  The  swelling was much improved.  Bruising medial and lateral.  Fluoroscopic  examination revealing the lateral fracture, small posterior fracture and  a soft tissue injury medially with just a few little flecks of bone off  the bottom of the medial malleolus.  Tourniquet applied, prepped and  draped in the usual sterile fashion.  Exsanguinated with elevation and  Esmarch.  Tourniquet inflated at 350 mmHg.  A longitudinal incision  laterally.  Subperiosteal exposure to the fibula.  Anatomic reduction.  Fixed with a five-hole one-third tubular plate traversing the fracture  and also acting as a buttress plate.  Syndesmosis did not significantly  open and therefore did not require fixation.  When the lateral side was  fixed, she  still had a significant capsular and medial soft tissue  injury, so there was not a lot of stability there.  A longitudinal  curved incision over the medial malleolus extending distally.  Skin and  subcutaneous tissues divided.  Retinaculum over the posterior tibial  tendon torn distally.  Tendon retracted.  The anterolateral,  anteromedial capsule and deltoid ligament all torn off the medial  malleolus.  A few small bony fragments debrided. There was not enough  bone to do a primary bone-to-bone repair.  Wound irrigated.  Ankle  irrigated.  A 5.5 mm Corkscrew was placed at bottom of the medial  malleolus and a FiberWire suture from that was weaved in the deltoid  ligament, firmly securing this back medially.  I then oversewed the  retinaculum and capsule with Vicryl.  This yielded nice anatomic  alignment throughout.  A few small avulsion fragments extra-articular  were just left in place as retrieving those would not add to her  benefit.  The construct was examined.  Anatomic alignment on all views.  The posterior fracture was small and did not require fixation.   The wound was irrigated  and closed with Vicryl and staples.  Sterile  compressive dressing applied.  Short leg splint applied.  Tourniquet  deflated and removed.  Anesthesia reversed.  Brought to recovery room.  Tolerated the surgery well.  No complications.      Loreta Ave, M.D.  Electronically Signed     DFM/MEDQ  D:  08/05/2006  T:  08/06/2006  Job:  [id number]

## 2010-11-21 NOTE — H&P (Signed)
[facility]  Patient:    Pamela [name], Pamela [name] [facility] Visit Number: [id number] MRN: [id number]          Service Type: GYN Location: 1S X006 01 Attending Physician:  Rolinda Roan Dictated by:   Leatha Gilding. Mezer, M.D. Admit Date:  06/07/2001   CC:         Janae Bridgeman. Eloise Harman., M.D.   History and Physical  ADMITTING DIAGNOSIS:  Right ovarian cyst.  HISTORY OF PRESENT ILLNESS:  The patient is a 65 year old nulligravida female admitted with right ovarian cyst for exploratory laparotomy, ? right salpingo-oophorectomy, ? bilateral salpingo-oophorectomy, ? supracervical hysterectomy, ? total abdominal hysterectomy.  The patient was recently found to have an ovarian cyst on the right ovary which measured 42.5 x 48.8 mm which was clear in nature with no free peritoneal fluid.  The need for evaluation has been discussed with the patient.  The CA125 was returned at 11.4.  The patients increased operative risk secondary to a prothrombin abnormality has been discussed in great detail.  The patient has had two episodes of deep venous thrombophlebitis, one in 1998 and one in January 2002.  She is maintained on Coumadin and is followed by Dr. Marcy Salvo C. Eloise Harman.  She has stopped her Coumadin five days before surgery and her PT and INR are within normal limits.  After consultation with Dr. Rande Brunt. Clarke-Pearson, the decision has been made to give the patient a preoperative dose of Lovenox. Potential extensive surgery ranging from removing just the right tube and ovary to removing both ovaries, to removing both tubes and ovaries and the uterus and/or cervix have been discussed with the patient at length.  The pros and cons of extending the procedure versus removing the uterus and decreasing the chance for potential bleeding in the future have been discussed at length. The final decision has been to remove the right ovary, the left ovary if surgically not  apparently complicated and to perform a hysterectomy only if extended dissection is required to remove the ovaries, which would then not significantly extend the procedure to remove the uterus.  Patient has been ambulatory, ambulated in the preoperative area and the pneumatic stockings with the pump have been initiated as soon as the patient was confined to the stretcher in the holding area.  Potential risks of surgery including but not limited to injury to the bowel, bladder, ureters, possible blood loss with transfusion and its sequelae, possible infection have been reviewed with the patient in detail.  The possibility of ovarian cancer has been discussed with the patient and Dr. Reuel Boom L. Clarke-Pearson will be available as standby should the need arise with a cancer diagnosis on frozen section. Postoperative restrictions and expectations have been reviewed with the patient and the patient has done a bowel prep preoperatively.  PAST MEDICAL HISTORY:  Surgical:  T&A, teeth, hand.  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  MEDICATIONS:  Levothroid.  ALLERGIES:  None known.  HABITS:  Smokes none.  ETOH:  Occasional.  SOCIAL HISTORY:  The patient is employed at [employer] at [employer] and lives with her husband.  FAMILY HISTORY:  Positive for oral cancer in the patients mother.  PHYSICAL EXAMINATION:  HEENT:  Negative.  LUNGS:  Clear.  HEART:  Without murmurs.  BREASTS:  Without masses or discharge.  ABDOMEN:  Soft and nontender.  PELVIC:  Exam reveals the BUS, vagina and cervix to be normal.  The uterus is anteverted, deviated to  the left and there is a fullness in the right adnexal area.  RECTAL:  Negative.  EXTREMITIES:  Negative.  IMPRESSION:  Right ovarian cyst.  PLAN:  Exploratory laparotomy and indicated procedures. Dictated by:   Leatha Gilding. Mezer, M.D. Attending Physician:  Rolinda Roan DD:  06/07/01 TD:   06/07/01 Job: [id number]

## 2010-11-21 NOTE — Op Note (Signed)
[name], [name]                       ACCOUNT NO.:  [id number]   MEDICAL RECORD NO.:  [id number]                   PATIENT TYPE:  AMB   LOCATION:  ENDO                                 [facility]ACILITY:  [facility]   PHYSICIAN:  Petra Kuba, M.D.                 DATE OF BIRTH:  1945-09-23   DATE OF PROCEDURE:  06/04/2003  DATE OF DISCHARGE:                                 OPERATIVE REPORT   PROCEDURE:  Colonoscopy.   ENDOSCOPIST:  Petra Kuba, M.D.   INDICATIONS FOR PROCEDURE:  Screening.   INFORMED CONSENT:  The consent was signed after the risks, benefits, methods  and options were thoroughly discussed in the office.   MEDICINES USED:  Demerol 70 mg, Versed 7 mg.   DESCRIPTION OF PROCEDURE:  A rectal inspection pertinent for small external  hemorrhoids.  A digital exam was negative.  The video pediatric adjustable  colonoscope was inserted and easily advanced around the colon to the cecum.  It did require some abdominal pressure, but no position changes.  No  abnormality was seen on insertion.  The scope was inserted a short ways into  the terminal ileum which was normal.  Photo-documentation was obtained.  The  scope was slowly withdrawn.  A slow withdrawal through the colon.  The prep  was fairly adequate.  It did require some washing and suctioning for  adequate visualization.  On a slow withdrawal back to the rectum, no  abnormalities were seen.  Anal rectal pull-through on retroflexion in the  rectum pertinent for the small hemorrhoids.  The scope was reinserted a  short ways up the left side of the colon.  Air was suctioned.  The scope was  removed.  The patient tolerated the procedure well.  There was no obvious immediate  complication.   ENDOSCOPIC ASSESSMENT:  1. Internal and external hemorrhoids.  2. Otherwise within normal limits to the terminal ileum.   PLAN:  Yearly rectals and guaiacs per Dr./ Pamela [name].  I will be happy  to see back p.r.n.  Otherwise  repeat screening in five years.                                               Petra Kuba, M.D.    MEM/MEDQ  D:  06/04/2003  T:  06/04/2003  Job:  [id number]   cc:   Pamela [name], M.D.  [address]  [place]  [place] [address]  Fax: [phone]

## 2010-11-21 NOTE — Op Note (Signed)
Pamela [name], Pamela [name]                       ACCOUNT NO.:  [id number]   MEDICAL RECORD NO.:  [id number]                   PATIENT TYPE:  AMB   LOCATION:  ENDO                                 [facility]AC[facility]   PHYSICIAN:  Petra Kuba, M.D.                 DATE OF BIRTH:  [birth year]   DATE OF PROCEDURE:  03/27/2003  DATE OF DISCHARGE:                                 OPERATIVE REPORT   PROCEDURE:  Endoscopic retrograde cholangiopancreatography with  sphincterotomy and stone extraction.   Consent was signed after risks, benefits, methods, and options thoroughly  discussed multiple times in the past.   MEDICINES USED:  Demerol 100 mg, Versed 10 mg, Glucagon 0.5 mg during the  procedure.   DESCRIPTION OF PROCEDURE:  The therapeutic video side-viewing duodenoscope  was inserted by indirect vision and her ampulla was brought into view.  It  had periampullary diverticula but no signs of any stent.  On fluoroscopy in  the upper abdomen, no stent was seen up in the ducts.  On the first  cannulation, a normal PD injection was obtained.  The sphincterotome was  repositioned and deep selective cannulation was obtained.  The previous  sphincterotomy site had narrowed some.  On initial injection no obvious  stones were seen.  There was some air in the ducts.  The Al Pimple was  inserted deep into the intrahepatic.  We went ahead and first increased the  sphincterotomy site some until we were able to get the three-quarters bowed  sphincterotome in and out of the duct.  Unfortunately, based on positioning  and increased spasm, we did not feel comfortable increasing the  sphincterotomy any further at this juncture.  We went ahead and first  exchanged the sphincterotome for the 15 mm balloon, and that pulled readily  through the duct.  Unfortunately, all balloons seemed to pop, probably more  on the elevator but possibly on the distal ampulla as they were withdrawn,  but they did withdraw with  minimal resistance.  Some debris and minimal  sludge was removed.  We went ahead and tried a 12 mm balloon, which again  readily passed with minimal debris and even less sludge, but this popped as  well.  We then went ahead and proceeded with another 15.  We did do an  occlusion cholangiogram at this juncture and again no obvious residual  stones were seen and on pulling this balloon through, there was no debris or  sludge.  Unfortunately, this balloon broke as well.  We went ahead and then  removed the wire and the balloon and cannulated using the 1.5-3 cm soft  basket and for about 10 minutes opened and closed the basket in various  portions of the duct and did sweep the duct a few times with the open  basket, filling the duct periodically with dye, but again no stones were  seen nor were any  captured.  After about 10 minutes of this activity, we  elected to stop the procedure at this juncture.  The scope was removed.  She  appeared to drain well without obvious stone or abnormality on delayed  films.  The scope was removed.  The patient tolerated the procedure well.  There was no evidence of immediate complication.   ENDOSCOPIC DIAGNOSES:  1. Periampullary diverticula.  2. No stent present.  3. One normal pancreatic duct injection, not overfilled.  4. Dilated common bile duct without obvious stone.  5. Increased the sphincterotomy site some.  6. Fifteen, 12, and 15 mm balloon pull-throughs with minimal debris and     sludge on the first two and negative occlusion cholangiogram on the     third.  7. Used the soft 3 cm basket to try to capture stones and to re-evaluate the     duct in an effort to make sure no residual stones were left, without any     stones being captured.  8. Normal delayed films and drainage.   PLAN:  Observe for delayed complications.  If none, see her back p.r.n. or  in three months to recheck liver tests and symptoms.  In the meantime, will  get an abdomen  and pelvis KUB to make sure the stent is not migrated  somewhere else.  If no obvious problems, will resume Lovenox today when she  gets home and Coumadin tomorrow, continuing Lovenox overlapping per usual  five to seven days.                                                 Petra Kuba, M.D.    MEM/MEDQ  D:  03/27/2003  T:  03/27/2003  Job:  [id number]   cc:   Alvester Morin, M.D.  [address]  [place]  [place] [address]  Fax: [phone]   Gabrielle Dare. Janee Morn, M.D.  [facility]  [address] [place], [place] [address]  Fax: [phone]

## 2010-11-21 NOTE — Discharge Summary (Signed)
NAME:  Pamela [name], Pamela [name]                       ACCOUNT NO.:  [id number]   MEDICAL RECORD NO.:  [id number]                   PATIENT TYPE:  INP   LOCATION:  5706                                 FACILITY:  [facility]   PHYSICIAN:  C. Ulyess Mort, M.D.             DATE OF BIRTH:  Dec 12, 1945   DATE OF ADMISSION:  11/03/2002  DATE OF DISCHARGE:  11/09/2002                                 DISCHARGE SUMMARY   DISCHARGE DIAGNOSES:  1. Choledocholithiasis, status post endoscopic retrograde     cholangiopancreatography.  2. Hypothyroidism.  3. Factor II deficiency, on chronic Coumadin therapy.   DISCHARGE MEDICATIONS:  1. Lovenox 60 mg subcu q.12h.  2. Coumadin 4 mg p.o. daily.  3. Synthroid 88 mcg p.o. daily.   CHIEF COMPLAINT:  Abnormal laboratory findings and a fever of unknown  origin.   HISTORY OF PRESENT ILLNESS:  The patient is a 65 year old white female with  a past medical history significant for hypothyroidism, factor II deficiency  resulting in a deep vein thrombosis x3, on chronic Coumadin therapy.  The  patient presents to the outpatient clinic complaining of a three-month  history of intermittent fevers up to 102 degrees, with chills.  No nausea,  vomiting, or diarrhea.  No significant abdominal pain, no chest pain.  No  decrease in appetite.  Only positive for intentional weight loss.  At that  time a CMP was done which showed an increased alkaline phosphatase of 1274,  increased liver function tests, increased bilirubin.  An abdominal CT showed  hepatic duct dilatation, common bile duct up to 2 cm with gallstones, and  distended gallbladder.  She was admitted, secondary to this.   ALLERGIES:  No known drug allergies.   PAST MEDICAL HISTORY:  1. Hypothyroidism since she was 65 years old.  2. Deep vein thrombosis x3, on chronic Coumadin.  3. Hypercholesterolemia, very mild.  4. History of right ovarian cyst in December 2002.   LABORATORY DATA:  Hemoglobin 11,  hematocrit 32.3, platelets 561.  PT 14.3,  INR 1.1, PTT 41 upon discharge.  On discharge sodium 141, potassium 3.9,  chloride 104, CO2 of 29, glucose 91, BUN 9, creatinine 0.9.  Calcium 9.2,  total protein 6.7, albumin 2.8, AST 36, ALT 51, alkaline phosphatase 676  which was down from 929 upon admission.  T-bilirubin 1.8, amylase 59, lipase  36.  Blood cultures x2 were negative.  Other laboratory work obtained prior to admission showed an ESR of 110, COP  of 9.5.  ANA negative.  M20 factor was less than 20.  HIV was nonreactive.  TSH was 0.769.   HOSPITAL COURSE:  #1 - CHOLEDOCHOLITHIASIS:  The patient was initially  started on Zosyn for the empiric treatment for her fever.  GI and surgery  consultations were obtained.  During the hospitalization GI performed an  ERCP with a stone extraction.  Surgery felt that they would wait until after  this  acute period for a cholecystectomy.  GI felt that she would likely need  a secondary procedure for the removal of this stone.  Dr. Petra Kuba was  to perform this.  It was felt that they removed all but one gallstone, which  was too large to remove.  A stent was placed.  She is to follow up in four  to six weeks, with further ERCP and GI evaluation at that time, and then a  possible cholecystectomy by surgery after the acute phase of this illness.  The patient was not continued on any antibiotics upon discharge.  #2 - FACTOR II DEFICIENCY:  The patient after the procedure was started back  on her routine Coumadin dose, as well as Lovenox therapy.  She is to follow  up with Dr. Shaune Leeks in the Coumadin Clinic upon discharge, for further  management of her PT and INR.  #3 - HYPOTHYROIDISM:  The patient has continued taking her Synthroid 88 mcg.   DISPOSITION:  The patient is to avoid fatty meals and she is to call if she  has any problems with vomiting, jaundice, or fever, or any signs of bleed.   FOLLOW UP:  To follow up with Dr. Ewing Schlein in  approximately one month.  She is  also to follow up with Dr. Steele Berg. Phifer in the next two to three weeks  for further evaluation of her multiple medical problems.  She is to see Dr.  Michaell Cowing in approximately three or four days, for further management of her  chronic Coumadin.       Catalina Pizza, M.D.                           Gary Fleet, M.D.    ZH/MEDQ  D:  01/09/2003  T:  01/09/2003  Job:  [id number]

## 2010-11-21 NOTE — Consult Note (Signed)
NAMEGLENIS, MUSOLF                       ACCOUNT NO.:  [id number]   MEDICAL RECORD NO.:  [id number]                   PATIENT TYPE:  INP   LOCATION:  5731                                 FACILITY:  [facility]   PHYSICIAN:  Gabrielle Dare. Janee Morn, M.D.             DATE OF BIRTH:  [birth year]   DATE OF CONSULTATION:  11/03/2002  DATE OF DISCHARGE:                                   CONSULTATION   REFERRING PHYSICIAN:  Dr. Ileana Roup.   REASON FOR CONSULTATION:  Choledocholithiasis.   HISTORY OF PRESENT ILLNESS:  The patient is a 65 year old white female with  a history of factor II deficiency and hypothyroidism, who presented  complaining of intermittent febrile episodes associated with chills over the  past four months.  This has been increasing in frequency but apparently, she  has never had any abdominal pain whatsoever.  The increasing frequency of  the febrile-and-chill episodes became concerning to her and her husband and  she sought medical treatment.  She was admitted to the [facility].  Her primary medical doctor is Dr. Steele Berg. Phifer.  Evaluation  upon admission included CAT scan showing a 2-cm common duct stone with  dilated common bile duct and intrahepatic biliary system; several other  stones were also visible in the common bile duct.  She also was noted to  have quite elevated liver function tests as well.  Currently, the patient  denies any abdominal pain, claims that she feels mildly feverish but has  been feeling better since she was admitted and she has no other complaints  and she grinned that she did not seek attention earlier but claims she has  never felt all that badly.   PAST MEDICAL HISTORY:  1. Hypothyroidism.  2. Factor II deficiency with hypercoagulability.  3. History of deep venous thrombosis.   PAST SURGICAL HISTORY:  1. Tonsillectomy.  2. Wisdom teeth extraction.  3. Excision of chondroma from left hand.  4. Resection of  ovarian cyst.   SOCIAL HISTORY:  She used to smoke but she just quit.  She occasionally  drinks some wine.   FAMILY HISTORY:  Father lived until he was 84.  Her mother had ovarian  cancer and passed away at age 48.   MEDICATIONS:  Medications currently in the hospital include:  1. Zosyn.  2. Heparin to begin once her INR normalizes.  3. Synthroid 0.88 mg p.o. daily.  4. Tylenol p.r.n.   ALLERGIES:  No known drug allergies.   REVIEW OF SYSTEMS:  GENERAL:  In general, she feels somewhat feverish and  otherwise has no other complaints.  CARDIOVASCULAR:  No complaints.  RESPIRATORY:  No complaints.  GI:  No current complaints except the history  of present illness.  MUSCULOSKELETAL:  No complaints and the rest of the  review of systems is negative.   PHYSICAL EXAMINATION:  VITAL SIGNS:  On physical exam, temperature  is 99.0,  pulse 82, respirations 18, blood pressure 135/76.  GENERAL:  She is awake, alert, in no acute distress.  She has some  generalized mild jaundice.  HEENT:  Her pupils are equal and reactive.  NECK:  Her neck is supple without palpable adenopathy.  CHEST:  Her chest is clear to auscultation bilaterally.  CARDIOVASCULAR:  Heart is regular rate and rhythm with her PMI palpable in  the left chest.  Her distal pulses are 2+ in the upper and lower  extremities.  ABDOMEN:  Her abdomen is soft, nontender and nondistended.  No masses are  palpable.  She has a healed Pfannenstiel incision with no hernia.  SKIN:  Skin is warm without any rashes.   DATA:  Data reviewed include laboratories -- white blood cell count 9.1,  hemoglobin 11.9, hematocrit 38, platelets 649,000; sodium 138, potassium  4.6, chloride 102, CO2 22, BUN 12, creatinine 0.7 and glucose of 86;  bilirubin is 2.0, AST is 54, ALT 88, alkaline phosphatase 1274.   CAT scan is also reviewed, as described above.   IMPRESSION:  1. Choledocholithiasis of possible chronic nature.  2. Mild cholangitis.    RECOMMENDATIONS:  Recommendations are for ERCP by the GI service, as I  discussed with Dr. Fayrene Fearing L. Edwards in detail.  I agree with their  recommendations for allowing her Coumadin to wear off and placing her on  heparin and planning for ERCP with attempted stone removal, but more likely  stenting done on Monday.  She will likely require several ERCP procedures  with __________  possible lithotripsy.  Once her common duct is cleared, we  will plan on proceeding with laparoscopic cholecystectomy.   Thank you very much for this consult.                                               Gabrielle Dare Janee Morn, M.D.    BET/MEDQ  D:  11/03/2002  T:  11/04/2002  Job:  [id number]

## 2010-11-21 NOTE — Op Note (Signed)
[facility]  Patient:    Pamela [name], Pamela [name] [facility] Visit Number: [id number] MRN: [id number]          Service Type: GYN Location: 4W 0455 01 Attending Physician:  Rolinda Roan Dictated by:   Leatha Gilding. Mezer, M.D. Proc. Date: 06/07/01 Admit Date:  06/07/2001   CC:         Harl Bowie, M.D.  Janae Bridgeman. Eloise Harman., M.D.   Operative Report  PREOPERATIVE DIAGNOSIS:  Right ovarian cyst.  POSTOPERATIVE DIAGNOSES: 1. Right ovarian cyst. 2. Adhesions.  OPERATION: 1. Exploratory laparotomy. 2. Right salpingo-oophorectomy.  SURGEON:  Leatha Gilding. Mezer, M.D.  ASSISTANT:  Harl Bowie, M.D.  ANESTHESIA:  General endotracheal.  PREPARATION:  Betadine.  DESCRIPTION OF PROCEDURE:  With the patient in the supine position, she was prepped and draped in routine fashion. A Pfannenstiel incision was made through the skin and subcutaneous tissue. The fascia and peritoneum were opened without difficulty. Pelvic washings were obtained. A brief exploration per of her upper abdomen was benign. Exploration of the pelvis revealed the uterus to be normal in size and contour, the right ovary contained an approximately 5-cm cyst but was free of adhesions, and the left adnexa contained significant adhesions with the left ovary being adherent to and into the pelvic sidewall, and also adherent to the bowel. There was no significant free fluid upon entering the peritoneal cavity and there were no excrescences on the ovary or cyst on the right side. The ureter was identified, the peritoneum opened between the round ligament and the infundibulopelvic ligament, and the infundibulopelvic ligament clamped, cut, and free tied with #1 chromic and then suture ligated with #1 chromic. The ureter was clearly out of harms way. The ovary was dissected up to the utero-ovarian ligament where the tube and utero-ovarian ligament were clamped, cut, and free tied with  #1 chromic and then suture ligated with #1 chromic. The pelvic sidewall was carefully inspected and no bleeding or oozing sites were noted. Per discussion with the patient preoperatively, because removing the left tube and ovary and performing hysterectomy would potentially cause bleeding and oozing, giving the amount of dissection that was required, the procedure was terminated at this point. The pelvis was irrigated with copious amounts of warm lactated Ringers solution and again, hemostasis was noted to be intact. At the completion of the procedure, _______ to move the large bowel into the cul-de-sac, the omentum was brought down and the abdomen was closed in layers using a running 2-0 Vicryl on the peritoneum running _______ to midline bilaterally on the fascia. Hemostasis was assured in the subcutaneous tissue and the skin was closed with staples. A pressure dressing was applied. The estimated blood loss was approximately 50 cc. The sponge, instrument, and needle counts were correct x 2. The patient tolerated the procedure well and was taken to the recovery room in satisfactory condition. Dictated by:   Leatha Gilding. Mezer, M.D. Attending Physician:  Rolinda Roan DD:  06/07/01 TD:  06/07/01 Job: [id number]

## 2010-11-21 NOTE — Consult Note (Signed)
Pamela [name], Pamela [name]                       ACCOUNT NO.:  [id number]   MEDICAL RECORD NO.:  [id number]                   PATIENT TYPE:  INP   LOCATION:  5731                                 FACILITY:  [facility]   PHYSICIAN:  James L. Malon Kindle., M.D.          DATE OF BIRTH:  Nov 25, 1945   DATE OF CONSULTATION:  11/03/2002  DATE OF DISCHARGE:                                   CONSULTATION   REASON FOR CONSULTATION:  Common bile duct stones.   HISTORY OF PRESENT ILLNESS:  The patient is a delightful 65 year old patient  of Dr. Steele Berg. Phifer who has been doing fairly well.  She has been  complaining of intermittent fevers up to 101 and 102 with associated chills.  This has been going on really back to January.  They would come and go, last  for an hour or two, and then go away.  For the past two weeks, he has had  increased fever and chills on almost a daily basis.  No abdominal pain,  nausea, vomiting, etc.   She presented with this history to Dr. Steele Berg. Phifer's clinic and labs  revealed a sedimentation rate of 110, negative blood cultures, total  bilirubin of 2.0, alkaline phosphate of 1200, ALT 88, AST 54 with albumin of  4.20.  The patient had an outpatient CT scan performed with concerns for  possible malignancy. This showed marked intrahepatic and extrahepatic  biliary duct dilation with stones in the common bile duct, multiple  gallstones, no signs of cholecystitis.  The common bile duct was massively  dilated up to 2 cm with multiple stones in the common bile duct.  There was  a large 2 cm stone that was felt by the radiologist to be a single stone  with some other stones above and below it.  The pancreas and the pancreatic  duct looked grossly normal.  Some of the other stones measured 1/2 to 1 cm.  The patient has never had abdominal pain.  This is completely and totally  asymptomatic at the current time.   CURRENT MEDICATIONS:  Levothroid, Coumadin, and vitamins.   ALLERGIES:  She has no drug allergies.   PAST MEDICAL HISTORY:  1. History of G20210A factor II mutilation resulting in a hypocoagulable     state.  2. She has had DVT at age 89 and apparently has done well since then and has     had two other DVTs since then.  She had been on chronic Coumadin therapy     for this reason.  3. She also has hypothyroidism.  4. High cholesterol that is mild.   PAST SURGICAL HISTORY:  Her only surgery was a surgery a couple of years ago  where she had a right ovarian cyst removed.  It was benign.   FAMILY HISTORY:  Mother died of ovarian cancer.  Father died of natural  causes at 46.  She has one  sister.  She has one adopted child.  There is no  family history of cancer other than that.   SOCIAL HISTORY:  She used to smoke a pack a day and quit in 1988.  Drinks  occasional wine.  She is married.  Lives with her husband and daughter.  Works for [employer].   PHYSICAL EXAMINATION:  VITAL SIGNS:  The patient is afebrile.  On my exam,  vital signs are still pending.  GENERAL:  Alert white female in no distress.  HEENT:  Eyes: Sclerae are nonicteric.  Extraocular movements intact.  LUNGS:  Clear.  HEART:  Regular rate and rhythm without murmurs or gallops.  ABDOMEN:  Soft, completely nontender with good bowel sounds.  Careful to  palpation.  Right upper quadrant shows no tenderness whatsoever.   ASSESSMENT:  Common bile duct stone.  The patient has a very large stone  that will likely require mechanical lithotripsy.  It may take several  sessions to remove.  She has a massively dilated common duct.  This will be  complicated by her coagulopathy requiring anticoagulation.   RECOMMENDATIONS:  We will go ahead and start her empirically on antibiotics.  Hold her Coumadin and let her pro time drift down to normal.  We will try to  get her on early next week for ERCP, sphincterotomy with mechanical  lithotripsy.  She may well need a common duct exploration if we  are  unsuccessful in terms of getting this out.  I suspect this will take several  sessions to completely clear all the stones from her bowel duct.  It may be  better to wait til this is done certainly endoscopically prior to proceeding  with cholecystectomy.                                               James L. Malon Kindle., M.D.    Waldron Session  D:  11/03/2002  T:  11/03/2002  Job:  [id number]   cc:   Alvester Morin, M.D.  [address]  [place]  [place] [address]  Fax: [phone]   Gabrielle Dare. Janee Morn, M.D.  [facility]  [address] [place], [place] [address]  Fax: [phone]

## 2010-11-21 NOTE — Op Note (Signed)
NAMEGELENE, RECKTENWALD                       ACCOUNT NO.:  [id number]   MEDICAL RECORD NO.:  [id number]                   PATIENT TYPE:  AMB   LOCATION:  DSC                                  FACILITY:  [facility]   PHYSICIAN:  Katy Fitch. Naaman Plummer., M.D.          DATE OF BIRTH:  Sep 24, 1945   DATE OF PROCEDURE:  01/24/2004  DATE OF DISCHARGE:                                 OPERATIVE REPORT   PREOPERATIVE DIAGNOSIS:  Chronic stenosing tenosynovitis, right index finger  at A1 pulley with painful ulnar sided ganglion on A-1 pulley.   POSTOPERATIVE DIAGNOSIS:  Chronic stenosing tenosynovitis, right index  finger at A1 pulley with painful ulnar sided ganglion on A-1 pulley.   OPERATION PERFORMED:  1. Release of right index finger A-1 pulley.  2. Excision of A-1 pulley ganglion, right index finger.   SURGEON:  Katy Fitch. Sypher, M.D.   ASSISTANT:  Jonni Sanger, P.A.   ANESTHESIA:  0.25% Marcaine and 2% lidocaine metacarpal head level block of  right index finger supplemented by IV sedation.   SUPERVISING ANESTHESIOLOGIST:  Zenon Mayo, MD   INDICATIONS FOR PROCEDURE:  Cherith Tewell is a 66 year old woman who  presented for evaluation and management of a painful mass on her right index  finger A-1 pulley with triggering symptoms involving the right index  flexors.  She has failed nonoperative measures.  After informed consent, we  have recommended proceeding with release of the right index A-1 pulley at  this time.  After informed consent and after consultation with the medical  doctor for management of her coagulopathy, she has been weaned from Coumadin  times five days and has been on IM Lovenox until 24 hours prior to surgery.  Her preoperative lab was acceptable to proceed at this time.   DESCRIPTION OF PROCEDURE:  Alexandera Kuntzman was brought to the operating room  and placed in supine position on the operating table.  After consultation  with Dr. Sampson Goon, sedation  was administered IV followed by placement of a  metacarpal head level block with 0.25% Marcaine and 2% lidocaine.  When  anesthesia was satisfactory, the right arm was prepped with Betadine  solution and sterilely draped.  A pneumatic tourniquet was applied to the  proximal right brachium.  Following exsanguination of the right hand with an  Esmarch bandage, the arterial tourniquet was inflated to 220 mmHg.  The  procedure commenced with a short oblique incision paralleling the middle  palmar crease.  Subcutaneous tissues were carefully divided taking care to  identify the neurovascular bundles to the index finger. The A-1 pulley was  isolated and a significant ganglion noted on its ulnar aspect.  The ganglion  was carefully removed with a rongeur.  The pulley was split along its radial  border with scissors.  Thereafter free range of motion of the fingers  recovered.  The wound was then repaired with mattress sutures of 5-0 nylon.  A compressive dressing was applied with Xeroflo, sterile gauze and an Ace  wrap.  For aftercare, Ms. Sheffield Slider is advised to elevate her hand and work on  range of motion exercises.  She will return for follow-up with our office in  approximately 7 to 10 days for suture removal and advancement to a therapy  program.  She is encouraged to move her hand fully immediately following  surgery.                                               Katy Fitch Naaman Plummer., M.D.    RVS/MEDQ  D:  01/24/2004  T:  01/24/2004  Job:  [id number]   cc:   Janae Bridgeman. Eloise Harman., M.D.  [address]  [place]  [place] [address]  Fax: [phone]   Alvester Morin, M.D.  [address]  [place]  [place] [address]  Fax: [phone]

## 2010-11-21 NOTE — Op Note (Signed)
Pamela [name], Pamela [name]             ACCOUNT NO.:  [id number]   MEDICAL RECORD NO.:  [id number]          PATIENT TYPE:  AMB   LOCATION:  DSC                          FACILITY:  [facility]   PHYSICIAN:  Loreta Ave, M.D. DATE OF BIRTH:  1945-11-09   DATE OF PROCEDURE:  06/24/2006  DATE OF DISCHARGE:                               OPERATIVE REPORT   PREOPERATIVE DIAGNOSES:  Lateral meniscus tear, chondromalacia of  patella, right knee.   POSTOPERATIVE DIAGNOSES:  Lateral meniscus tear, chondromalacia of  patella, right knee.   PROCEDURES:  Right knee examination under anesthesia, arthroscopy,  chondroplasty of patella, partial lateral meniscectomy.   SURGEON:  Loreta Ave, M.D.   ASSISTANT:  Genene Churn. Denton Meek.   ANESTHESIA:  Knee block with sedation.   SPECIMENS:  None.   CULTURES:  None.   COMPLICATIONS:  None.   DRESSING:  Soft compressive.   TOURNIQUET:  Not employed.   DESCRIPTION OF PROCEDURES:  The patient was brought to the operating  room, and after adequate anesthesia had been obtained, the knee  examined.  Fairly good motion and stability.  Patellofemoral crepitus.  Tourniquet and leg holder applied.  Leg prepped and draped in the usual  sterile fashion.  Two portals, 1 each, medial and lateral parapatellar.  Inflow catheter introduced.  Knee distended.  The arthroscope was  introduced.  Grade 3 changes of the patella, debrided with  chondroplasty.  Good tracking.  Trochlea looked good.  The medial and  lateral compartments looked good.  A little grade 2 change lateral, and  a very small focal area, grade 4, very margin of the lateral femoral  condyle.  Medial meniscus intact.  Cruciate ligament intact.  Extensive  tearing of lateral meniscus.  Near total lateral meniscectomy, leaving a  little bit in the back at completion.  Entire knee examined to be sure  all fragments removed.  Instruments and fluid removed.  Portals and knee injected with Marcaine.  Portals were closed with 4-0 nylon.  Sterile compressive dressing  applied.  Anesthesia reversed.  Brought to the recovery room.  Tolerated  surgery well with no complications.      Loreta Ave, M.D.  Electronically Signed     DFM/MEDQ  D:  06/24/2006  T:  06/24/2006  Job:  [id number]

## 2010-11-21 NOTE — H&P (Signed)
   Pamela [name], Pamela [name]                       ACCOUNT NO.:  [id number]   MEDICAL RECORD NO.:  [id number]                   PATIENT TYPE:  AMB   LOCATION:  ENDO                                 FACILITY:  [facility]   PHYSICIAN:  Petra Kuba, M.D.                 DATE OF BIRTH:  1946/03/01   DATE OF ADMISSION:  01/02/2003  DATE OF DISCHARGE:                                HISTORY & PHYSICAL   HISTORY:  The patient admitted status post ERCP for observation and for  possible laparoscopic cholecystectomy tomorrow.  Symptomatic prior to the  procedure.  Please see that dictation for details.   Her past medical history is pertinent for hypercoagulable state on chronic  Coumadin secondary to multiple DVTs, hypothyroidism, mild increased  cholesterol.   CURRENT MEDICATIONS:  1. Levothroid.  2. Coumadin.  3. Vitamins.   ALLERGIES:  None.   PAST SURGICAL HISTORY:  Pertinent for right ovarian cyst removal.   FAMILY HISTORY:  Negative for obvious GI problems.   SOCIAL HISTORY:  Quit smoking. Drinks occasionally.  Minimizes other over-  the-counter medicine.   REVIEW OF SYSTEMS:  Negative except for above.   PHYSICAL EXAMINATION:  GENERAL:  Preprocedure she was asymptomatic.  VITAL SIGNS:  Stable, afebrile.  LUNGS:  Clear.  HEART:  Regular  rate and rhythm.  ABDOMEN:  Soft, nontender.  Good bowel sounds.   ASSESSMENT:  1. Status post sphincterotomy and multiple fragments removed after     lithotripsy and stent placement for large CVD stone.  2. Gallstones.  3. Hypercoagulable state.  4. Hypothyroidism.    PLAN:  Observe for her delayed complications.  If none, laparoscopic  cholecystectomy tomorrow with followup ERCP and stent removal in six to  eight weeks.                                                Petra Kuba, M.D.    MEM/MEDQ  D:  01/02/2003  T:  01/02/2003  Job:  [id number]

## 2010-11-21 NOTE — Op Note (Signed)
Pamela [name], Pamela [name]                       ACCOUNT NO.:  [id number]   MEDICAL RECORD NO.:  [id number]                   PATIENT TYPE:  OUT   LOCATION:  XRAY                                 FACILITY:  [facility]   PHYSICIAN:  Petra Kuba, M.D.                 DATE OF BIRTH:  September 25, 1945   DATE OF PROCEDURE:  01/02/2003  DATE OF DISCHARGE:                                 OPERATIVE REPORT   PROCEDURE PERFORMED:  Endoscopic retrograde cholangiopancreatography with  sphincterotomy, stent removal and stent placement, balloon stone extraction,  mechanical lithotripsy.   ENDOSCOPIST:  Petra Kuba, M.D.   INDICATIONS FOR PROCEDURE:  Retained common bile duct stone.  Consent was  signed after the risks, benefits, methods and options were thoroughly  discussed on multiple occasions.   MEDICINES USED:  Demerol 175 mg, Versed 15 mg, Glucagon 2 mg throughout the  procedure.   DESCRIPTION OF PROCEDURE:  The side viewing therapeutic video duodenoscope  was inserted by indirect vision into the stomach and advanced through a  normal antrum, normal pylorus into the duodenal bulb and the ampulla and the  stent previously placed were brought into view.  There was a small  periampullary diverticulum.  Using the snare, we grabbed the stent and  removed it in a customary fashion.  Unfortunately the CBD had spasm and no  bile was seen until later in the procedure.  Using first the triple lumen  sphincterotome, later with a Jag wire and then the tapered sphincterotome,  we were unsuccessful at obtaining cannulation to the CBD.  Multiple PD  injections and occasionally passing a wire into the PD were obtained.  Once  we realized we were in the PD,  we did not overfill it.  The PD did appear  normal.  Finally, we were able to cannulate using the Jag wire with the  triple lumen.  The CBD stone was still present and the duct was still  dilated.  We went ahead and increased the sphincterotomy site  significantly,  until we were able to easily get the fully bowed sphincterotome in and out  of the duct and we were able to get excellent biliary drainage.  No blood  was seen.  We first tried the 15 mm balloon which was exchanged for the  sphincterotome over the wire in the customary fashion, but the stone would  not pass through the mid duct once it began to taper.  We then went ahead  and used the Wilson-Cook basket and were able to capture the stone and then  went ahead with mechanical lithotripsy since we could not remove it in the  customary fashion but cutting the basket and attaching it to the Soehendra  stone crusher which was done with excellent success.  We then proceeded with  about ten 15 mm balloon pull-throughs.  We popped three different balloons  and obtained multiple fragments on  multiple balloon pull-throughs, some  being moderately large.  We did proceed with a few occlusion cholangiograms  and there was a moderate amount of air in the system but no obvious large  fragments.  We then went ahead and recannulated three times using the basket  and swept the duct multiple times with the basket collecting smaller  fragments and removing them with the basket in the customary fashion.  At  this point the ampulla seemed to have increased edema and there was  increased spasm and based on the length of the procedure, we went ahead and  recannulated using the triple lumen sphincterotome and over the Jag wire in  the customary fashion, we placed a 10 French 7 cm Tannenbaum stent with  proper position under fluoroscopy.  The scope was removed.  The patient  tolerated the procedure adequately.  There was no obvious immediate  complication.   ENDOSCOPIC DIAGNOSIS:  1. Removed stent with snare.  2. Some difficulty recannulating with some pancreatic duct injections and     advancing the wire.  3. Large common bile duct stone and a dilated common bile duct.  Status post     increased  sphincterotomy site, unable to remove with a 15 mm balloon.  4. Grabbed using the basket and crushed using mechanical lithotripter.  5. Multiple large fragments were removed with multiple 15 mm balloon pull     throughs and then multiple baskets removing other fragments.  6. Due to increased edema and air in the system, I elected to place a 10     French 7 cm Tannenbaum stent and although not much drainage was seen, it     seemed to be in proper position under fluoroscopy.   PLAN:  Observe for delayed complications.  If none, okay with me to proceed  with laparoscopic cholecystectomy tomorrow and then repeat ERCP with stent  removal in six to eight weeks to remove any residual fragments and resweep  the duct.  Have discussed the above with Gabrielle Dare. Janee Morn, M.D. and will  notify Alvester Morin, M.D. of the admission.                                               Petra Kuba, M.D.    MEM/MEDQ  D:  01/02/2003  T:  01/02/2003  Job:  [id number]   cc:   Gabrielle Dare. Janee Morn, M.D.  [facility]  [address] [place], [place] [address]  Fax: [phone]   Alvester Morin, M.D.  [address]  [place]  [place] [address]  Fax: [phone]   Llana Aliment. Malon Kindle., M.D.  [address]  [place]  [place] [address]  Fax: [phone]

## 2010-11-21 NOTE — Op Note (Signed)
NAME:  Pamela [name], Pamela [name]                       ACCOUNT NO.:  [id number]   MEDICAL RECORD NO.:  [id number]                   PATIENT TYPE:  INP   LOCATION:                                       FACILITY:  [facility]   PHYSICIAN:  Petra Kuba, M.D.                 DATE OF BIRTH:  1945/07/11   DATE OF PROCEDURE:  DATE OF DISCHARGE:                                 OPERATIVE REPORT   PROCEDURE:  Endoscopic retrograde cholangiopancreatography with  sphincterotomy, stone extraction, and stent placement.   INDICATION:  CBD stones on CT scan.  Patient with elevated liver tests.  Episodic fevers.  Consent was signed after risks, benefits, and methods  options, thoroughly discussed in multiplication with the patient and her  husband.   MEDICINE RECEIVED:  Demerol 140, Versed 12, Glucagon 1.0.   PROCEDURE:  A side-viewing and therapeutic video duodenoscope was inserted  by indirect vision into the stomach, advanced through a normal antrum,  normal pylorus, into the duodenum.  A bulbous ampulla was brought into view.  There was a duodenal diverticulum seen distal to this.  Using the triple  lumen sphinctertome, we were able to get selective cannulation and using the  Jagwire, we were able to get deep selective cannulation.  Obvious CBD stones  were seen on the initial cannulation.  Some contrast was able to enter the  intrahepatic, although we did not try to overfill the duct system.  We kept  the Jagwire above the bifurcation for the majority of the procedure.  We  went ahead and proceeded with a large sphincterotomy.  We got excellent  biliary drainage and were able to get the full __________ sphinctertome in  and out of the duct and proceeded with multiple 15 mm balloon pull-throughs.  On multiple occasions, we did break the balloons periodically, trying to  blow the balloons up and taking 1 or 2 stones out of the distal duct at a  time and at least 8 moderate sized stones were removed.  Once  we removed 1  or 2, sludge was seen with all balloon pull-throughs as well.  After all the  small to medium sized stones seemed to be cleared, the large stone was still  present, but had been lowered to the distal duct.  It was too big to get out  the ampulla and we then went ahead and increased the sphincterotomy site due  to some edema and also in an effort to allow the basket to be placed more  readily. Using 2 different baskets, we did try under Fluoro guidance, to try  to capture this stones.  We would get parts of it and seemed to shear some  of it off, but could not capture the entire stone.  Lots of debris was  removed with the basket.  Based on the amount of debris and some shearing of  some of the  stone out, we elected to re-increase the sphincterotomy site a  little bit one more time and retry a balloon.  Lots of debris was removed  with this maneuver, but again, could not put enough pressure on the balloon  to remove this large stone and the balloon popped.  At this juncture, the  patient was beginning to wake up, so we elected to place a stent at this  juncture and not try any further.  The sphinctertome was used to recannulate  and the Al Pimple was replaced into the intrahepatics.  As in a side the  baskets, were not over-the-wire baskets, so the wire was removed for that  part of the procedure, but had remained in place for all the other balloon  pull-throughs.  Once the wire was in the proper place, using the Oasis  system in the customary fashion, a 10-frame 7 cm Tannenbaum stent was placed  into the proper position, confirmed endoscopically and fluoroscopically.  The wire and introducer were removed.  There was good biliary drainage.  The  scope was removed.  Patient tolerated the procedure well.  There were no  obvious immediate complications.   ENDOSCOPIC DIAGNOSES:  1. Bulbous ampulla.  2. No pancreatic duct injections and no cystic duct filling.  3. Dilated common  bile duct with multiple common bile duct stones; one large     one; some intrahepatic filling, but did not try to overfill the     intrahepatics.  Status post sphincterotomy with 2 times increasing the     size.  4. Unable to capture the large stone with multiple basket attempts.  5. Able to deliver multiple stones using the 15 mm balloon.  6. Ten-frame/7 cm Tannenbaum stent placed with good drainage at the end of     the procedure.   PLAN:  We will observe for delayed complications.  If none, retry possibly  in 6 weeks.  We will rediscuss whether I will do it here or sending her to  [facility] if their Mother-Daughter scope with laser is operable.  We prefer  Lovenox for the better part of a week, starting Coumadin back in roughly 3-4  days.  However, could heparinize tomorrow and okay use of Lovenox today,  over with antibiotic for 24 hours, but then if no signs of infection, okay  to stop from my standpoint.  We will follow with you.  Thank you very much.                                               Petra Kuba, M.D.    MEM/MEDQ  D:  11/08/2002  T:  11/09/2002  Job:  [id number]   cc:   Alvester Morin, M.D.  [address]  [place]  [place] [address]  Fax: [phone]   Gabrielle Dare. Janee Morn, M.D.  [facility]  [address] [place], [place] [address]  Fax: [phone]

## 2010-11-21 NOTE — Op Note (Signed)
Pamela [name], Pamela [name]                         ACCOUNT NO.:  [id number]   MEDICAL RECORD NO.:  [id number]                   PATIENT TYPE:  OBV   LOCATION:  5714                                 FACILITY:  [facility]   PHYSICIAN:  Gabrielle Dare. Janee Morn, M.D.             DATE OF BIRTH:  08/26/1945   DATE OF PROCEDURE:  01/03/2003  DATE OF DISCHARGE:                                 OPERATIVE REPORT   PREOPERATIVE DIAGNOSES:  1. Choledocholithiasis.  2. Cholelithiasis.   POSTOPERATIVE DIAGNOSES:  1. Choledocholithiasis.  2. Cholelithiasis.   PROCEDURE:  Laparoscopic cholecystectomy with intraoperative cholangiogram.   SURGEON:  Gabrielle Dare. Janee Morn, M.D.   ASSISTANT:  Rose Phi. Maple Hudson, M.D.   ANESTHESIA:  General.   INDICATIONS:  The patient is a 65 year old female who I initially saw  approximately 6-8 weeks ago as an in hospital consult for  choledocholithiasis.  Dr. Vida Rigger did her initial ERCP.  She had a very  large stone in her common duct which he was not able to initially remove.  At that time, she underwent stent placement.  Yesterday, she went for  followup ERCP.  The stone was crushed, and multiple fragments were removed.  The stent was again left by Dr. Ewing Schlein, and she is brought for  cholecystectomy in order to prevent recurrence of this problem.   DESCRIPTION OF PROCEDURE:  Informed consent was obtained.  The patient is  receiving intravenous antibiotics.  She is brought to the operating room.  General anesthesia was administered.  Her abdomen was prepped and draped in  a sterile fashion.  A curvilinear infraumbilical incision was made.  Subcutaneous tissues were dissected down within the anterior fascia which  was divided sharply.  The peritoneal cavity was then entered under direct  vision without difficulty.  Subsequently, a 0 Vicryl pursestring was placed  around the fascial opening and a Hasson trocar was inserted in the abdomen,  and the abdomen was insufflated with  carbon dioxide in the standard fashion.  Under direct vision, an 11 mm epigastric port and two 5 mm lateral ports  were placed.  The dome of the gallbladder was retracted superiorly medially.  Several loose filmy adhesions from the omentum were taken down off of the  gallbladder.  This proceeded down gradually exposing the infundibulum.  Once  this was cleaned off, the infundibulum was retracted inferior laterally.  Dissection started laterally and worked medially gradually taking down some  inflammatory adhesions that were more densely clustered down near the cystic  duct.  These were cleared off revealing the cystic duct to be of pretty good  length and quite tortuous.  It was circumferentially dissected making a good  window between the infundibulum and cystic duct and the liver.  Once this  window was enlarged, a clip was placed on the infundibulum and cystic duct  junction and small hole was made in the cystic duct and a red  cholangiogram  catheter was inserted.  An intraoperative cholangiogram with shunt revealing  the stent in the common bile.  No other filling defects were obviously  noted, but the common hepatic duct remained dilated.  The cystic duct was  noted to be long and tortuous.  The cholangiogram catheter was removed.  The  cystic duct was clipped three times proximally and divided.  Subsequently,  further dissection revealed the cystic artery.  A window was dissected  around this.  It was clipped twice proximally and once distally and divided.  The gallbladder was then taken off the liver bed with the Bovie cautery.  It  was quite adherent due to some inflammation.  Several areas in the liver  were cauterized with excellent hemostasis.  One small puncture wound was  made in the gallbladder during the dissection.  Once it was removed from the  liver bed it was placed in an EndoCatch bag and taken out of the abdomen via  the umbilical port site.  The liver bed was then  copiously irrigated with  two liters of saline.  Several small areas of bleeding were controlled with  Bovie cautery.  The irrigant and fluid was evacuated, and the liver bed was  rechecked.  We were in good position, and the area was hemostatic.  The  remainder of the irrigation fluid was evacuated and was clear.  The ports  were removed under direct vision.  The pneumoperitoneum was released.  The  soft trocar was removed.  The umbilical fascia was closed by tying the 0  Vicryl pursestring suture.  All four wounds were copiously irrigated.  Sponge, needle and instrument counts were correct.  The skin of each was  closed with running 4-0 Vicryl subcuticular suture.  Note that 0.25%  Marcaine had been used for anesthesia at the port sites.  The patient was  taken to the recovery room after tolerating the procedure without any  apparent complications.  She was in stable condition.                                               Gabrielle Dare Janee Morn, M.D.    BET/MEDQ  D:  01/03/2003  T:  01/03/2003  Job:  [id number]

## 2010-12-15 ENCOUNTER — Ambulatory Visit (INDEPENDENT_AMBULATORY_CARE_PROVIDER_SITE_OTHER): Payer: Medicare Other | Admitting: Pharmacist

## 2010-12-15 DIAGNOSIS — Z86718 Personal history of other venous thrombosis and embolism: Secondary | ICD-10-CM

## 2010-12-15 DIAGNOSIS — Z7901 Long term (current) use of anticoagulants: Secondary | ICD-10-CM

## 2010-12-15 DIAGNOSIS — D682 Hereditary deficiency of other clotting factors: Secondary | ICD-10-CM

## 2010-12-15 NOTE — Patient Instructions (Signed)
Patient instructed to take medications as defined in the Anti-coagulation Track section of this encounter.  Patient instructed to take today's dose.  Patient verbalized understanding of these instructions.    

## 2010-12-15 NOTE — Progress Notes (Signed)
Anti-Coagulation Progress Note  Pamela [name] is a 65 y.o. female who is currently on an anti-coagulation regimen.    RECENT RESULTS: Recent results are below, the most recent result is correlated with a dose of 20 mg. per week: Lab Results  Component Value Date   INR 1.80 12/15/2010   INR 2.2 11/03/2010   INR 3.2 10/06/2010    ANTI-COAG DOSE:   Latest dosing instructions   Total Sun Mon Tue Wed Thu Fri Sat   22 2 mg 4 mg 4 mg 4 mg 2 mg 4 mg 2 mg    (4 mg0.5) (4 mg1) (4 mg1) (4 mg1) (4 mg0.5) (4 mg1) (4 mg0.5)         ANTICOAG SUMMARY: Anticoagulation Episode Summary              Current INR goal 2.0-3.0 Next INR check 01/12/2011   INR from last check 1.80! (12/15/2010)     Weekly max dose (mg)  Target end date Indefinite   Indications FACTOR II DEFICIENCY, DVT, HX OF (Resolved), Long term current use of anticoagulant   [facility] check [facility]    Send INR reminders to [facility]   Comments        Provider Role Specialty Phone number   [name] [facility]  Internal Medicine [phone]        ANTICOAG TODAY: Anticoagulation Summary as of 12/15/2010              INR goal 2.0-3.0     Selected INR 1.80! (12/15/2010) Next INR check 01/12/2011   Weekly max dose (mg)  Target end date Indefinite   Indications FACTOR II DEFICIENCY, DVT, HX OF (Resolved), Long term current use of anticoagulant    Anticoagulation Episode Summary              INR check location [facility] Preferred lab    Send INR reminders to [facility]   Comments        Provider Role Specialty Phone number   [name] [facility]  Internal Medicine [phone]        PATIENT INSTRUCTIONS: Patient Instructions  Patient instructed to take medications as defined in the Anti-coagulation Track section of this encounter.  Patient instructed to take today's dose.  Patient verbalized understanding of these instructions.        FOLLOW-UP Return in 4 weeks (on 01/12/2011) for  Follow up INR.  Hulen Luster, III Pharm.D., CACP

## 2011-01-12 ENCOUNTER — Ambulatory Visit: Payer: Medicare Other
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
## 2011-01-19 ENCOUNTER — Ambulatory Visit (INDEPENDENT_AMBULATORY_CARE_PROVIDER_SITE_OTHER): Payer: Medicare Other | Admitting: Pharmacist

## 2011-01-19 DIAGNOSIS — D682 Hereditary deficiency of other clotting factors: Secondary | ICD-10-CM

## 2011-01-19 DIAGNOSIS — Z86718 Personal history of other venous thrombosis and embolism: Secondary | ICD-10-CM

## 2011-01-19 DIAGNOSIS — Z7901 Long term (current) use of anticoagulants: Secondary | ICD-10-CM

## 2011-01-19 NOTE — Patient Instructions (Signed)
Patient instructed to take medications as defined in the Anti-coagulation Track section of this encounter.  Patient instructed to take today's dose.  Patient verbalized understanding of these instructions.    

## 2011-01-19 NOTE — Progress Notes (Signed)
Anti-Coagulation Progress Note  FATIM VANDERSCHAAF is a 65 y.o. female who is currently on an anti-coagulation regimen.    RECENT RESULTS: Recent results are below, the most recent result is correlated with a dose of 22 mg. per week: Lab Results  Component Value Date   INR 2.2 01/19/2011   INR 1.80 12/15/2010   INR 2.2 11/03/2010    ANTI-COAG DOSE:   Latest dosing instructions   Total Sun Mon Tue Wed Thu Fri Sat   22 2 mg 4 mg 4 mg 4 mg 2 mg 4 mg 2 mg    (4 mg0.5) (4 mg1) (4 mg1) (4 mg1) (4 mg0.5) (4 mg1) (4 mg0.5)         ANTICOAG SUMMARY: Anticoagulation Episode Summary              Current INR goal 2.0-3.0 Next INR check 02/23/2011   INR from last check 2.2 (01/19/2011)     Weekly max dose (mg)  Target end date Indefinite   Indications FACTOR II DEFICIENCY, DVT, HX OF (Resolved), Long term current use of anticoagulant   INR check location [facility] Pref[facility]    Send INR reminders to [facility]   Comments        Provider Role Specialty Phone number   [name] [facility]  Internal Medicine [phone]        ANTICOAG TODAY: Anticoagulation Summary as of 01/19/2011              INR goal 2.0-3.0     Selected INR 2.2 (01/19/2011) Next INR check 02/23/2011   Weekly max dose (mg)  Target end date Indefinite   Indications FACTOR II DEFICIENCY, DVT, HX OF (Resolved), Long term current use of anticoagulant    Anticoagulation Episode Summary              INR check location [facility] Preferred [facility]    Send INR reminders to [facility]   Comments        Provider Role Specialty Phone number   [name] [facility]  Internal Medicine [phone]        PATIENT INSTRUCTIONS: Patient Instructions  Patient instructed to take medications as defined in the Anti-coagulation Track section of this encounter.  Patient instructed to take today's dose.  Patient verbalized understanding of these instructions.        FOLLOW-UP Return in 5 weeks (on 02/23/2011) for  Follow up INR.  Hulen Luster, III Pharm.D., CACP

## 2011-02-23 ENCOUNTER — Ambulatory Visit (INDEPENDENT_AMBULATORY_CARE_PROVIDER_SITE_OTHER): Payer: Medicare Other | Admitting: Pharmacist

## 2011-02-23 DIAGNOSIS — D682 Hereditary deficiency of other clotting factors: Secondary | ICD-10-CM

## 2011-02-23 DIAGNOSIS — Z86718 Personal history of other venous thrombosis and embolism: Secondary | ICD-10-CM

## 2011-02-23 DIAGNOSIS — Z7901 Long term (current) use of anticoagulants: Secondary | ICD-10-CM

## 2011-02-23 NOTE — Patient Instructions (Signed)
Patient instructed to take medications as defined in the Anti-coagulation Track section of this encounter.  Patient instructed to take today's dose.  Patient verbalized understanding of these instructions.    

## 2011-02-23 NOTE — Progress Notes (Signed)
Anti-Coagulation Progress Note  Pamela [name] is a 65 y.o. female who is currently on an anti-coagulation regimen.    RECENT RESULTS: Recent results are below, the most recent result is correlated with a dose of 24 mg. per week: Lab Results  Component Value Date   INR 3.00 02/23/2011   INR 2.2 01/19/2011   INR 1.80 12/15/2010    ANTI-COAG DOSE:   Latest dosing instructions   Total Sun Mon Tue Wed Thu Fri Sat   22 2 mg 4 mg 4 mg 4 mg 2 mg 4 mg 2 mg    (4 mg0.5) (4 mg1) (4 mg1) (4 mg1) (4 mg0.5) (4 mg1) (4 mg0.5)         ANTICOAG SUMMARY: Anticoagulation Episode Summary              Current INR goal 2.0-3.0 Next INR check 03/23/2011   INR from last check 3.00 (02/23/2011)     Weekly max dose (mg)  Target end date Indefinite   Indications FACTOR II DEFICIENCY, DVT, HX OF (Resolved), Long term current use of anticoagulant   [facility]    Send INR reminders to [facility]   Comments        Provider Role Specialty Phone number   [name] [facility]  Internal Medicine [phone]        ANTICOAG TODAY: Anticoagulation Summary as of 02/23/2011              INR goal 2.0-3.0     Selected INR 3.00 (02/23/2011) Next INR check 03/23/2011   Weekly max dose (mg)  Target end date Indefinite   Indications FACTOR II DEFICIENCY, DVT, HX OF (Resolved), Long term current use of anticoagulant    Anticoagulation Episode Summary              IN[facility] check location [facility] Preferred [facility]    Send INR reminders to [facility]   Comments        Provider Role Specialty Phone number   [name] [facility]  Internal Medicine [phone]        PATIENT INSTRUCTIONS: Patient Instructions  Patient instructed to take medications as defined in the Anti-coagulation Track section of this encounter.  Patient instructed to take today's dose.  Patient verbalized understanding of these instructions.        FOLLOW-UP Return in 4 weeks (on 03/23/2011)  for Follow up INR.  Hulen Luster, III Pharm.D., CACP

## 2011-03-03 ENCOUNTER — Encounter: Payer: Self-pay | Admitting: Internal Medicine

## 2011-03-03 ENCOUNTER — Ambulatory Visit (INDEPENDENT_AMBULATORY_CARE_PROVIDER_SITE_OTHER): Payer: Medicare Other | Admitting: Internal Medicine

## 2011-03-03 VITALS — BP 126/84 | HR 78 | Temp 98.4°F | Wt 197.3 lb

## 2011-03-03 DIAGNOSIS — E785 Hyperlipidemia, unspecified: Secondary | ICD-10-CM

## 2011-03-03 DIAGNOSIS — E039 Hypothyroidism, unspecified: Secondary | ICD-10-CM

## 2011-03-03 DIAGNOSIS — R030 Elevated blood-pressure reading, without diagnosis of hypertension: Secondary | ICD-10-CM

## 2011-03-03 DIAGNOSIS — F329 Major depressive disorder, single episode, unspecified: Secondary | ICD-10-CM

## 2011-03-03 DIAGNOSIS — Z0000 Encounter for general adult medical examination without abnormal findings: Secondary | ICD-10-CM

## 2011-03-03 DIAGNOSIS — F3289 Other specified depressive episodes: Secondary | ICD-10-CM

## 2011-03-03 DIAGNOSIS — K219 Gastro-esophageal reflux disease without esophagitis: Secondary | ICD-10-CM

## 2011-03-03 MED ORDER — PANTOPRAZOLE SODIUM 40 MG PO TBEC: 40.0000 mg | DELAYED_RELEASE_TABLET | Freq: Every day | ORAL | Status: DC
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
## 2011-03-03 NOTE — Patient Instructions (Signed)
I will see you back in 6 months. Please come in tomorrow morning for fasting blood work. Take care of your self.

## 2011-03-03 NOTE — Assessment & Plan Note (Signed)
Blood pressures been well-controlled last couple of visits if this continues we'll take her off of her problem list.

## 2011-03-03 NOTE — Assessment & Plan Note (Signed)
Patient is doing well and I think that we should come off her antidepressant today. I told her that certainly has a short half-life so she should feel it out of her system fairly quickly.  I have also told her that if she starts to feel any symptoms even 4-6 weeks from now like to she does not feel well off meds, which I think is unlikely, to let us know.[name]

## 2011-03-03 NOTE — Assessment & Plan Note (Signed)
Mrs. Pamela [name] complains today of some acid reflux symptoms as well some nighttime coughing. I believe both of these are consistent with some GE reflux. Have started her on all generic Protonix 40 mg a day. I told her that she should definitely feel a change in her symptoms in 2 weeks if not before. If this continues to help she can stay on it. She can also try in a couple of months to come off it and see if she still needs it.

## 2011-03-03 NOTE — Assessment & Plan Note (Signed)
We will check a free T4 and TSH tomorrow when she comes in for fasting labs. Patient has traditionally we needed to be on the high normal is for her to feel her best.

## 2011-03-03 NOTE — Assessment & Plan Note (Signed)
Patient just had her mammogram done and brings those in a copy which was normal. As well her Zostavax was done and is not recorded she is otherwise completely up-to-date on health maintenance. Again she'll be coming in for fasting lipid panel tomorrow. I will see her back in 6 months.

## 2011-03-04 ENCOUNTER — Other Ambulatory Visit (INDEPENDENT_AMBULATORY_CARE_PROVIDER_SITE_OTHER): Payer: Medicare Other

## 2011-03-04 DIAGNOSIS — E039 Hypothyroidism, unspecified: Secondary | ICD-10-CM

## 2011-03-04 DIAGNOSIS — E785 Hyperlipidemia, unspecified: Secondary | ICD-10-CM

## 2011-03-04 DIAGNOSIS — Z23 Encounter for immunization: Secondary | ICD-10-CM

## 2011-03-04 LAB — T4, FREE: Free T4: 1.63 ng/dL (ref 0.80–1.80)

## 2011-03-04 LAB — LIPID PANEL
Cholesterol: 227 mg/dL — ABNORMAL HIGH (ref 0–200)
HDL: 74 mg/dL (ref >39)
LDL Cholesterol: 134 mg/dL — ABNORMAL HIGH (ref 0–99)
Total CHOL/HDL Ratio: 3.1 Ratio
Triglycerides: 95 mg/dL (ref <150)
VLDL: 19 mg/dL (ref 0–40)

## 2011-03-04 LAB — COMPREHENSIVE METABOLIC PANEL
Alkaline Phosphatase: 62 U/L (ref 39–117)
CO2: 26 mEq/L (ref 19–32)
Creat: 0.81 mg/dL (ref 0.50–1.10)
Glucose, Bld: 90 mg/dL (ref 70–99)
Total Bilirubin: 0.7 mg/dL (ref 0.3–1.2)

## 2011-03-04 LAB — TSH: TSH: 0.531 u[IU]/mL (ref 0.350–4.500)

## 2011-03-04 NOTE — Progress Notes (Signed)
Addended by: Maura Crandall on: 03/04/2011 03:47 PM   Modules accepted: Orders

## 2011-03-19 ENCOUNTER — Other Ambulatory Visit: Payer: Self-pay | Admitting: *Deleted

## 2011-03-19 DIAGNOSIS — Z7901 Long term (current) use of anticoagulants: Secondary | ICD-10-CM

## 2011-03-19 DIAGNOSIS — Z86718 Personal history of other venous thrombosis and embolism: Secondary | ICD-10-CM

## 2011-03-19 DIAGNOSIS — D682 Hereditary deficiency of other clotting factors: Secondary | ICD-10-CM

## 2011-03-19 MED ORDER — WARFARIN SODIUM 4 MG PO TABS: ORAL_TABLET | ORAL | Status: DC

## 2011-03-23 ENCOUNTER — Ambulatory Visit: Payer: Medicare Other
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
## 2011-05-04 ENCOUNTER — Ambulatory Visit (INDEPENDENT_AMBULATORY_CARE_PROVIDER_SITE_OTHER): Payer: Medicare Other | Admitting: Pharmacist

## 2011-05-04 DIAGNOSIS — D682 Hereditary deficiency of other clotting factors: Secondary | ICD-10-CM

## 2011-05-04 DIAGNOSIS — Z86718 Personal history of other venous thrombosis and embolism: Secondary | ICD-10-CM

## 2011-05-04 DIAGNOSIS — Z7901 Long term (current) use of anticoagulants: Secondary | ICD-10-CM

## 2011-05-04 LAB — POCT INR: INR: 2.6

## 2011-05-04 NOTE — Progress Notes (Signed)
Anti-Coagulation Progress Note  Pamela [name] is a 65 y.o. female who is currently on an anti-coagulation regimen.    RECENT RESULTS: Recent results are below, the most recent result is correlated with a dose of 22 mg. per week: Lab Results  Component Value Date   INR 2.60 05/04/2011   INR 3.00 02/23/2011   INR 2.2 01/19/2011    ANTI-COAG DOSE:   Latest dosing instructions   Total Sun Mon Tue Wed Thu Fri Sat   22 2 mg 4 mg 4 mg 4 mg 2 mg 4 mg 2 mg    (4 mg0.5) (4 mg1) (4 mg1) (4 mg1) (4 mg0.5) (4 mg1) (4 mg0.5)         ANTICOAG SUMMARY: Anticoagulation Episode Summary              Current INR goal 2.0-3.0 Next INR check 06/01/2011   INR from last check 2.60 (05/04/2011)     Weekly max dose (mg)  Target end date Indefinite   Indications FACTOR II DEFICIENCY, DVT, HX OF (Resolved), Long term current use of anticoagulant   [facility]    Send INR reminders to [facility]   Comments        Provider Role Specialty Phone number   [name] [facility]  Internal Medicine [phone]        ANTICOAG TODAY: Anticoagulation Summary as of 05/04/2011              INR goal 2.0-3.0     Selected INR 2.60 (05/04/2011) Next INR check 06/01/2011   Weekly max dose (mg)  Target end date Indefinite   Indications FACTOR II DEFICIENCY, DVT, HX OF (Resolved), Long term current use of anticoagulant    Anticoagulation Episode Summary              INR check location [facility] Preferred [facility]    Send INR reminders to [facility]   Comments        Provider Role Specialty Phone number   [name] [facility]  Internal Medicine [phone]        PATIENT INSTRUCTIONS: Patient Instructions  Patient instructed to take medications as defined in the Anti-coagulation Track section of this encounter.  Patient instructed to take today's dose.  Patient verbalized understanding of these instructions.        FOLLOW-UP Return for Follow up  INR.  Hulen Luster, III Pharm.D., CACP

## 2011-05-04 NOTE — Patient Instructions (Signed)
Patient instructed to take medications as defined in the Anti-coagulation Track section of this encounter.  Patient instructed to take today's dose.  Patient verbalized understanding of these instructions.    

## 2011-06-01 ENCOUNTER — Ambulatory Visit (INDEPENDENT_AMBULATORY_CARE_PROVIDER_SITE_OTHER): Payer: Medicare Other | Admitting: Pharmacist

## 2011-06-01 DIAGNOSIS — Z86718 Personal history of other venous thrombosis and embolism: Secondary | ICD-10-CM

## 2011-06-01 DIAGNOSIS — D682 Hereditary deficiency of other clotting factors: Secondary | ICD-10-CM

## 2011-06-01 DIAGNOSIS — Z7901 Long term (current) use of anticoagulants: Secondary | ICD-10-CM

## 2011-06-01 LAB — POCT INR: INR: 2.6

## 2011-06-01 NOTE — Patient Instructions (Signed)
Patient instructed to take medications as defined in the Anti-coagulation Track section of this encounter.  Patient instructed to take today's dose.  Patient verbalized understanding of these instructions.    

## 2011-06-01 NOTE — Progress Notes (Signed)
Anti-Coagulation Progress Note  Pamela [name] is a 65 y.o. female who is currently on an anti-coagulation regimen.    RECENT RESULTS: Recent results are below, the most recent result is correlated with a dose of 22 mg. per week: Lab Results  Component Value Date   INR 2.60 06/01/2011   INR 2.60 05/04/2011   INR 3.00 02/23/2011    ANTI-COAG DOSE:   Latest dosing instructions   Total Sun Mon Tue Wed Thu Fri Sat   22 2 mg 4 mg 4 mg 4 mg 2 mg 4 mg 2 mg    (4 mg0.5) (4 mg1) (4 mg1) (4 mg1) (4 mg0.5) (4 mg1) (4 mg0.5)         ANTICOAG SUMMARY: Anticoagulation Episode Summary              Current INR goal 2.0-3.0 Next INR check 07/13/2011   INR from last check 2.60 (06/01/2011)     Weekly max dose (mg)  Target end date Indefinite   Indications FACTOR II DEFICIENCY, DVT, HX OF (Resolved), Long term current use of anticoagulant   INR check location [facility] Preferred lab    Send INR reminders to [facility]   Comments        Provider Role Specialty Phone number   [name] [facility]  Internal Medicine [phone]        ANTICOAG TODAY: Anticoagulation Summary as of 06/01/2011              INR goal 2.0-3.0     Selected INR 2.60 (06/01/2011) Next INR check 07/13/2011   Weekly max dose (mg)  Target end date Indefinite   Indications FACTOR II DEFICIENCY, DVT, HX OF (Resolved), Long term current use of anticoagulant    Anticoagulation Episode Summary              INR check location [facility] Preferred [facility]    Send INR reminders to [facility]   Comments        Provider Role Specialty Phone number   [name] [facility]  Internal Medicine [phone]        PATIENT INSTRUCTIONS: Patient Instructions  Patient instructed to take medications as defined in the Anti-coagulation Track section of this encounter.  Patient instructed to take today's dose.  Patient verbalized understanding of these instructions.        FOLLOW-UP Return in 6 weeks (on  07/13/2011) for Follow up INR.  Hulen Luster, III Pharm.D., CACP

## 2011-07-13 ENCOUNTER — Ambulatory Visit (INDEPENDENT_AMBULATORY_CARE_PROVIDER_SITE_OTHER): Payer: Medicare Other | Admitting: Pharmacist

## 2011-07-13 DIAGNOSIS — Z7901 Long term (current) use of anticoagulants: Secondary | ICD-10-CM

## 2011-07-13 DIAGNOSIS — D682 Hereditary deficiency of other clotting factors: Secondary | ICD-10-CM

## 2011-07-13 DIAGNOSIS — Z86718 Personal history of other venous thrombosis and embolism: Secondary | ICD-10-CM

## 2011-07-13 LAB — POCT INR: INR: 2.8

## 2011-07-13 NOTE — Progress Notes (Signed)
Anti-Coagulation Progress Note  Pamela [name] is a 66 y.o. female who is currently on an anti-coagulation regimen.    RECENT RESULTS: Recent results are below, the most recent result is correlated with a dose of 22 mg. per week: Lab Results  Component Value Date   INR 2.80 07/13/2011   INR 2.60 06/01/2011   INR 2.60 05/04/2011    ANTI-COAG DOSE:   Latest dosing instructions   Total Sun Mon Tue Wed Thu Fri Sat   22 2 mg 4 mg 4 mg 4 mg 2 mg 4 mg 2 mg    (4 mg0.5) (4 mg1) (4 mg1) (4 mg1) (4 mg0.5) (4 mg1) (4 mg0.5)         ANTICOAG SUMMARY: Anticoagulation Episode Summary              Current INR goal 2.0-3.0 Next INR check 09/07/2011   INR from last check 2.80 (07/13/2011)     Weekly max dose (mg)  Target end date Indefinite   Indications FACTOR II DEFICIENCY, DVT, HX OF (Resolved), Long term current use of anticoagulant   INR check location [facility] Pref[facility]    Send INR reminders to [facility]   Comments        Provider Role Specialty Phone number   [name] [facility]  Internal Medicine [phone]        ANTICOAG TODAY: Anticoagulation Summary as of 07/13/2011              INR goal 2.0-3.0     Selected INR 2.80 (07/13/2011) Next INR check 09/07/2011   Weekly max dose (mg)  Target end date Indefinite   Indications FACTOR II DEFICIENCY, DVT, HX OF (Resolved), Long term current use of anticoagulant    Anticoagulation Episode Summary              INR check location [facility] Preferred [facility]    Send INR reminders to [facility]   Comments        Provider Role Specialty Phone number   [name] [facility]  Internal Medicine [phone]        PATIENT INSTRUCTIONS: Patient Instructions  Patient instructed to take medications as defined in the Anti-coagulation Track section of this encounter.  Patient instructed to take today's dose.  Patient verbalized understanding of these instructions.        FOLLOW-UP Return in 8 weeks (on 09/07/2011) for  Follow up INR.  Hulen Luster, III Pharm.D., CACP

## 2011-07-13 NOTE — Patient Instructions (Signed)
Patient instructed to take medications as defined in the Anti-coagulation Track section of this encounter.  Patient instructed to take today's dose.  Patient verbalized understanding of these instructions.    

## 2011-08-25 ENCOUNTER — Other Ambulatory Visit: Payer: Self-pay | Admitting: *Deleted

## 2011-08-25 DIAGNOSIS — E039 Hypothyroidism, unspecified: Secondary | ICD-10-CM

## 2011-08-25 MED ORDER — LEVOTHYROXINE SODIUM 100 MCG PO TABS: 100.0000 ug | ORAL_TABLET | Freq: Every day | ORAL | Status: DC

## 2011-08-25 NOTE — Telephone Encounter (Signed)
Pt has appointment scheduled 3/4

## 2011-08-25 NOTE — Telephone Encounter (Signed)
She needs to sch an appt ASAP with PCP. Will give three months refill so that she has time to get in.

## 2011-08-25 NOTE — Telephone Encounter (Signed)
Pt is out of meds

## 2011-09-07 ENCOUNTER — Ambulatory Visit (INDEPENDENT_AMBULATORY_CARE_PROVIDER_SITE_OTHER): Payer: Medicare Other | Admitting: Pharmacist

## 2011-09-07 DIAGNOSIS — Z7901 Long term (current) use of anticoagulants: Secondary | ICD-10-CM

## 2011-09-07 DIAGNOSIS — D682 Hereditary deficiency of other clotting factors: Secondary | ICD-10-CM

## 2011-09-07 NOTE — Patient Instructions (Signed)
Patient instructed to take medications as defined in the Anti-coagulation Track section of this encounter.  Patient instructed to OMIT today's dose.  Patient verbalized understanding of these instructions.    

## 2011-09-07 NOTE — Progress Notes (Signed)
Anti-Coagulation Progress Note  Pamela [name] is a 66 y.o. female who is currently on an anti-coagulation regimen.    RECENT RESULTS: Recent results are below, the most recent result is correlated with a dose of 22 mg. per week: Lab Results  Component Value Date   INR 4.3 09/07/2011   INR 2.80 07/13/2011   INR 2.60 06/01/2011    ANTI-COAG DOSE:   Latest dosing instructions   Total Sun Mon Tue Wed Thu Fri Sat   18 2 mg 4 mg 2 mg 2 mg 2 mg 4 mg 2 mg    (4 mg0.5) (4 mg1) (4 mg0.5) (4 mg0.5) (4 mg0.5) (4 mg1) (4 mg0.5)         ANTICOAG SUMMARY: Anticoagulation Episode Summary              Current INR goal 2.0-3.0 Next INR check 09/28/2011   INR from last check 4.3! (09/07/2011)     Weekly max dose (mg)  Target end date Indefinite   Indications FACTOR II DEFICIENCY, DVT, HX OF (Resolved), Long term current use of anticoagulant   INR check location [facility] Preferred [facility]    Send INR reminders to [facility]   Comments        Provider Role Specialty Phone number   Zoila Shutter, MD  Internal Medicine [phone]        ANTICOAG TODAY: Anticoagulation Summary as of 09/07/2011              INR goal 2.0-3.0     Selected INR 4.3! (09/07/2011) Next INR check 09/28/2011   Weekly max dose (mg)  Target end date Indefinite   Indications FACTOR II DEFICIENCY, DVT, HX OF (Resolved), Long term current use of anticoagulant    Anticoagulation Episode Summary              INR check location [facility] Preferred [facility]    Send INR reminders to [facility]   Comments        Provider Role Specialty Phone number   Zoila Shutter, MD  Internal Medicine [phone]        PATIENT INSTRUCTIONS: Patient Instructions  Patient instructed to take medications as defined in the Anti-coagulation Track section of this encounter.  Patient instructed to OMIT today's dose.  Patient verbalized understanding of these instructions.        FOLLOW-UP Return in 3 weeks (on  09/28/2011) for Follow up INR.  Hulen Luster, III Pharm.D., CACP

## 2011-09-07 NOTE — Progress Notes (Signed)
Agree with Dr. Saralyn Pilar management and follow-up plan.

## 2011-09-28 ENCOUNTER — Ambulatory Visit (INDEPENDENT_AMBULATORY_CARE_PROVIDER_SITE_OTHER): Payer: Medicare Other | Admitting: Pharmacist

## 2011-09-28 DIAGNOSIS — D682 Hereditary deficiency of other clotting factors: Secondary | ICD-10-CM

## 2011-09-28 DIAGNOSIS — Z7901 Long term (current) use of anticoagulants: Secondary | ICD-10-CM

## 2011-09-28 LAB — POCT INR: INR: 1.8

## 2011-09-28 NOTE — Patient Instructions (Signed)
Patient instructed to take medications as defined in the Anti-coagulation Track section of this encounter.  Patient instructed to take today's dose.  Patient verbalized understanding of these instructions.    

## 2011-09-28 NOTE — Progress Notes (Signed)
Anti-Coagulation Progress Note  Pamela [name] is a 66 y.o. female who is currently on an anti-coagulation regimen.    RECENT RESULTS: Recent results are below, the most recent result is correlated with a dose of 18 mg. per week: Lab Results  Component Value Date   INR 1.80 09/28/2011   INR 4.3 09/07/2011   INR 2.80 07/13/2011    ANTI-COAG DOSE:   Latest dosing instructions   Total Sun Mon Tue Wed Thu Fri Sat   20 2 mg 4 mg 4 mg 2 mg 2 mg 4 mg 2 mg    (4 mg0.5) (4 mg1) (4 mg1) (4 mg0.5) (4 mg0.5) (4 mg1) (4 mg0.5)         ANTICOAG SUMMARY: Anticoagulation Episode Summary              Current INR goal 2.0-3.0 Next INR check 10/26/2011   INR from last check 1.80! (09/28/2011)     Weekly max dose (mg)  Target end date Indefinite   Indications FACTOR II DEFICIENCY, DVT, HX OF (Resolved), Long term current use of anticoagulant   INR check location [facility]    Send INR reminders to [facility]   Comments        Provider Role Specialty Phone number   Zoila Shutter, MD  Internal Medicine [phone]        ANTICOAG TODAY: Anticoagulation Summary as of 09/28/2011              INR goal 2.0-3.0     Selected INR 1.80! (09/28/2011) Next INR check 10/26/2011   Weekly max dose (mg)  Target end date Indefinite   Indications FACTOR II DEFICIENCY, DVT, HX OF (Resolved), Long term current use of anticoagulant    Anticoagulation Episode Summary              INR check location [facility]    Send INR reminders to [facility]   Comments        Provider Role Specialty Phone number   Zoila Shutter, MD  Internal Medicine [phone]        PATIENT INSTRUCTIONS: Patient Instructions  Patient instructed to take medications as defined in the Anti-coagulation Track section of this encounter.  Patient instructed to take today's dose.  Patient verbalized understanding of these instructions.        FOLLOW-UP Return in 4 weeks (on  10/26/2011) for Follow up INR.  Hulen Luster, III Pharm.D., CACP

## 2011-10-26 ENCOUNTER — Ambulatory Visit (INDEPENDENT_AMBULATORY_CARE_PROVIDER_SITE_OTHER): Payer: Medicare Other | Admitting: Pharmacist

## 2011-10-26 DIAGNOSIS — Z7901 Long term (current) use of anticoagulants: Secondary | ICD-10-CM

## 2011-10-26 DIAGNOSIS — D682 Hereditary deficiency of other clotting factors: Secondary | ICD-10-CM

## 2011-10-26 LAB — POCT INR: INR: 2.6

## 2011-10-26 NOTE — Patient Instructions (Signed)
Patient instructed to take medications as defined in the Anti-coagulation Track section of this encounter.  Patient instructed to take today's dose.  Patient verbalized understanding of these instructions.    

## 2011-10-26 NOTE — Progress Notes (Signed)
Anti-Coagulation Progress Note  Pamela [name] is a 66 y.o. female who is currently on an anti-coagulation regimen.    RECENT RESULTS: Recent results are below, the most recent result is correlated with a dose of 20 mg. per week: Lab Results  Component Value Date   INR 2.60 10/26/2011   INR 1.80 09/28/2011   INR 4.3 09/07/2011    ANTI-COAG DOSE:   Latest dosing instructions   Total Sun Mon Tue Wed Thu Fri Sat   20 2 mg 4 mg 4 mg 2 mg 2 mg 4 mg 2 mg    (4 mg0.5) (4 mg1) (4 mg1) (4 mg0.5) (4 mg0.5) (4 mg1) (4 mg0.5)         ANTICOAG SUMMARY: Anticoagulation Episode Summary              Current INR goal 2.0-3.0 Next INR check 11/23/2011   INR from last check 2.60 (10/26/2011)     Weekly max dose (mg)  Target end date Indefinite   Indications FACTOR II DEFICIENCY, DVT, HX OF (Resolved), Long term current use of anticoagulant   INR check location [facility]    Send INR reminders to [facility]   Comments        Provider Role Specialty Phone number   Zoila Shutter, MD  Internal Medicine [phone]        ANTICOAG TODAY: Anticoagulation Summary as of 10/26/2011              INR goal 2.0-3.0     Selected INR 2.60 (10/26/2011) Next INR check 11/23/2011   Weekly max dose (mg)  Target end date Indefinite   Indications FACTOR II DEFICIENCY, DVT, HX OF (Resolved), Long term current use of anticoagulant    Anticoagulation Episode Summary              INR check location [facility]    Send INR reminders to [facility]   Comments        Provider Role Specialty Phone number   Zoila Shutter, MD  Internal Medicine [phone]        PATIENT INSTRUCTIONS: Patient Instructions  Patient instructed to take medications as defined in the Anti-coagulation Track section of this encounter.  Patient instructed to take today's dose.  Patient verbalized understanding of these instructions.        FOLLOW-UP Return in 4 weeks (on  11/23/2011) for Follow up INR.  Hulen Luster, III Pharm.D., CACP

## 2011-11-09 ENCOUNTER — Encounter: Payer: Medicare Other | Admitting: Internal Medicine

## 2011-11-16 ENCOUNTER — Ambulatory Visit (INDEPENDENT_AMBULATORY_CARE_PROVIDER_SITE_OTHER): Payer: Medicare Other | Admitting: Pharmacist

## 2011-11-16 DIAGNOSIS — D682 Hereditary deficiency of other clotting factors: Secondary | ICD-10-CM

## 2011-11-16 DIAGNOSIS — Z7901 Long term (current) use of anticoagulants: Secondary | ICD-10-CM

## 2011-11-16 LAB — POCT INR: INR: 2.2

## 2011-11-16 NOTE — Patient Instructions (Signed)
Patient instructed to take medications as defined in the Anti-coagulation Track section of this encounter.  Patient instructed to take today's dose.  Patient verbalized understanding of these instructions.    

## 2011-11-16 NOTE — Progress Notes (Signed)
Anti-Coagulation Progress Note  Pamela [name] is a 66 y.o. female who is currently on an anti-coagulation regimen.    RECENT RESULTS: Recent results are below, the most recent result is correlated with a dose of 20 mg. per week: Lab Results  Component Value Date   INR 2.20 11/16/2011   INR 2.60 10/26/2011   INR 1.80 09/28/2011    ANTI-COAG DOSE:   Latest dosing instructions   Total Sun Mon Tue Wed Thu Fri Sat   20 2 mg 4 mg 4 mg 2 mg 2 mg 4 mg 2 mg    (4 mg0.5) (4 mg1) (4 mg1) (4 mg0.5) (4 mg0.5) (4 mg1) (4 mg0.5)         ANTICOAG SUMMARY: Anticoagulation Episode Summary              Current INR goal 2.0-3.0 Next INR check 12/14/2011   INR from last check 2.20 (11/16/2011)     Weekly max dose (mg)  Target end date Indefinite   Indications FACTOR II DEFICIENCY, DVT, HX OF (Resolved), Long term current use of anticoagulant   INR check location [facility] Preferred [facility]    Send INR reminders to [facility]   Comments        Provider Role Specialty Phone number   Zoila Shutter, MD  Internal Medicine [phone]        ANTICOAG TODAY: Anticoagulation Summary as of 11/16/2011              INR goal 2.0-3.0     Selected INR 2.20 (11/16/2011) Next INR check 12/14/2011   Weekly max dose (mg)  Target end date Indefinite   Indications FACTOR II DEFICIENCY, DVT, HX OF (Resolved), Long term current use of anticoagulant    Anticoagulation Episode Summary              INR check location [facility] Preferred lab    Send INR reminders to [facility]   Comments        Provider Role Specialty Phone number   Zoila Shutter, MD  Internal Medicine [phone]        PATIENT INSTRUCTIONS: Patient Instructions  Patient instructed to take medications as defined in the Anti-coagulation Track section of this encounter.  Patient instructed to take today's dose.  Patient verbalized understanding of these instructions.        FOLLOW-UP Return in 4 weeks (on  12/14/2011) for Follow up INR.  Hulen Luster, III Pharm.D., CACP

## 2011-12-14 ENCOUNTER — Ambulatory Visit (INDEPENDENT_AMBULATORY_CARE_PROVIDER_SITE_OTHER): Payer: Medicare Other | Admitting: Pharmacist

## 2011-12-14 DIAGNOSIS — D682 Hereditary deficiency of other clotting factors: Secondary | ICD-10-CM

## 2011-12-14 DIAGNOSIS — Z7901 Long term (current) use of anticoagulants: Secondary | ICD-10-CM

## 2011-12-14 NOTE — Progress Notes (Signed)
Anti-Coagulation Progress Note  Pamela [name] is a 66 y.o. female who is currently on an anti-coagulation regimen.    RECENT RESULTS: Recent results are below, the most recent result is correlated with a dose of 20 mg. per week:  Will OMIT ONE DOSE and decrease to 18mg /wk and repeat INR in one week. Patient has hypothyroidism noted in problem list. States she has NOT had any INCREASE in  Her thyroid medication--nor is having any signs or symptoms of HYPER-THYROIDISM, i.e. She has NOT increased her dose of thyroid medication. Hyperthyroidism can cause a marked hypoprothrombinemic response--because the vitamin K dependent clotting factors synthesized in the liver are more rapidly cleared in this setting. Not sure if that is operational here--but she denies any other typical reason for her INR increasing, i.e., no new medications (e.g. Antibiotics, etc.) and her diet has remained relatively stable she states. We discussed safety concerns over the next few days. Lab Results  Component Value Date   INR 6.20 12/14/2011   INR 2.20 11/16/2011   INR 2.60 10/26/2011    ANTI-COAG DOSE:   Latest dosing instructions   Total [name] Tue Wed Thu Fri Sat   18 2 mg 2 mg 4 mg 2 mg 2 mg 4 mg 2 mg    (4 mg0.5) (4 mg0.5) (4 mg1) (4 mg0.5) (4 mg0.5) (4 mg1) (4 mg0.5)         ANTICOAG SUMMARY: Anticoagulation Episode Summary              Current INR goal 2.0-3.0 Next INR check 12/21/2011   INR from last check 6.20! (12/14/2011)     Weekly max dose (mg)  Target end date Indefinite   Indications FACTOR II DEFICIENCY, DVT, HX OF (Resolved), Long term current use of anticoagulant   INR check location [facility]    Send INR reminders to [facility]AG [facility]   Comments        Provider Role Specialty Phone number   Zoila Shutter, MD  Internal Medicine [phone]        ANTICOAG TODAY: Anticoagulation Summary as of 12/14/2011              INR goal 2.0-3.0     Selected INR 6.20!  (12/14/2011) Next INR check 12/21/2011   Weekly max dose (mg)  Target end date Indefinite   Indications FACTOR II DEFICIENCY, DVT, HX OF (Resolved), Long term current use of anticoagulant    Anticoagulation Episode Summary              INR check location [facility] Preferred lab    Send INR reminders to [facility]   Comments        Provider Role Specialty Phone number   Zoila Shutter, MD  Internal Medicine [phone]        PATIENT INSTRUCTIONS: Patient Instructions  Patient instructed to take medications as defined in the Anti-coagulation Track section of this encounter.  Patient instructed to OMIT today's dose.  Patient verbalized understanding of these instructions.        FOLLOW-UP Return in 7 days (on 12/21/2011) for Follow up INR at 0945h.  Hulen Luster, III Pharm.D., CACP

## 2011-12-14 NOTE — Progress Notes (Signed)
Ms. Holderman interim history was reviewed and I agree with Dr. Saralyn Pilar assessment and plan as documented.

## 2011-12-14 NOTE — Patient Instructions (Signed)
Patient instructed to take medications as defined in the Anti-coagulation Track section of this encounter.  Patient instructed to OMIT today's dose.  Patient verbalized understanding of these instructions.    

## 2011-12-21 ENCOUNTER — Ambulatory Visit (INDEPENDENT_AMBULATORY_CARE_PROVIDER_SITE_OTHER): Payer: Medicare Other | Admitting: Pharmacist

## 2011-12-21 DIAGNOSIS — Z7901 Long term (current) use of anticoagulants: Secondary | ICD-10-CM

## 2011-12-21 DIAGNOSIS — D682 Hereditary deficiency of other clotting factors: Secondary | ICD-10-CM

## 2011-12-21 LAB — POCT INR: INR: 3.1

## 2011-12-21 NOTE — Progress Notes (Signed)
Anti-Coagulation Progress Note  Pamela [name] is a 66 y.o. female who is currently on an anti-coagulation regimen.    RECENT RESULTS: Recent results are below, the most recent result is correlated with a dose of 18 mg. per week: Lab Results  Component Value Date   INR 3.10 12/21/2011   INR 6.20 12/14/2011   INR 2.20 11/16/2011    ANTI-COAG DOSE:   Latest dosing instructions   Total Sun Mon Tue Wed Thu Fri Sat   16 2 mg 2 mg 2 mg 4 mg 2 mg 2 mg 2 mg    (4 mg0.5) (4 mg0.5) (4 mg0.5) (4 mg1) (4 mg0.5) (4 mg0.5) (4 mg0.5)         ANTICOAG SUMMARY: Anticoagulation Episode Summary              Current INR goal 2.0-3.0 Next INR check 01/04/2012   INR from last check 3.10! (12/21/2011)     Weekly max dose (mg)  Target end date Indefinite   Indications FACTOR II DEFICIENCY, DVT, HX OF (Resolved), Long term current use of anticoagulant   INR check location [facility] lab    Send INR reminders to [facility]   Comments        Provider Role Specialty Phone number   Zoila Shutter, MD  Internal Medicine [phone]        ANTICOAG TODAY: Anticoagulation Summary as of 12/21/2011              INR goal 2.0-3.0     Selected INR 3.10! (12/21/2011) Next INR check 01/04/2012   Weekly max dose (mg)  Target end date Indefinite   Indications FACTOR II DEFICIENCY, DVT, HX OF (Resolved), Long term current use of anticoagulant    Anticoagulation Episode Summary              INR check location [facility] Preferred lab    Send INR reminders to [facility]   Comments        Provider Role Specialty Phone number   Zoila Shutter, MD  Internal Medicine [phone]        PATIENT INSTRUCTIONS: Patient Instructions  Patient instructed to take medications as defined in the Anti-coagulation Track section of this encounter.  Patient instructed to take today's dose.  Patient verbalized understanding of these instructions.        FOLLOW-UP Return in 2 weeks  (on 01/04/2012) for Follow up INR at 1015h.  Hulen Luster, III Pharm.D., CACP

## 2011-12-21 NOTE — Patient Instructions (Signed)
Patient instructed to take medications as defined in the Anti-coagulation Track section of this encounter.  Patient instructed to take today's dose.  Patient verbalized understanding of these instructions.    

## 2012-01-04 ENCOUNTER — Ambulatory Visit (INDEPENDENT_AMBULATORY_CARE_PROVIDER_SITE_OTHER): Payer: Medicare Other | Admitting: Pharmacist

## 2012-01-04 DIAGNOSIS — D682 Hereditary deficiency of other clotting factors: Secondary | ICD-10-CM

## 2012-01-04 DIAGNOSIS — Z7901 Long term (current) use of anticoagulants: Secondary | ICD-10-CM

## 2012-01-04 LAB — POCT INR: INR: 1.4

## 2012-01-04 NOTE — Progress Notes (Signed)
Anti-Coagulation Progress Note  Pamela [name] is a 66 y.o. female who is currently on an anti-coagulation regimen.    RECENT RESULTS: Recent results are below, the most recent result is correlated with a dose of 16 mg. per week: Lab Results  Component Value Date   INR 1.40 01/04/2012   INR 3.10 12/21/2011   INR 6.20 12/14/2011    ANTI-COAG DOSE:   Latest dosing instructions   Total Sun Mon Tue Wed Thu Fri Sat   18 2 mg 2 mg 4 mg 4 mg 2 mg 2 mg 2 mg    (4 mg0.5) (4 mg0.5) (4 mg1) (4 mg1) (4 mg0.5) (4 mg0.5) (4 mg0.5)         ANTICOAG SUMMARY: Anticoagulation Episode Summary              Current INR goal 2.0-3.0 Next INR check 01/25/2012   INR from last check 1.40! (01/04/2012)     Weekly max dose (mg)  Target end date Indefinite   Indications FACTOR II DEFICIENCY, DVT, HX OF (Resolved), Long term current use of anticoagulant   INR check location [facility]    Send INR reminders to [facility]   Comments        Provider Role Specialty Phone number   Zoila Shutter, MD  Internal Medicine [phone]        ANTICOAG TODAY: Anticoagulation Summary as of 01/04/2012              INR goal 2.0-3.0     Selected INR 1.40! (01/04/2012) Next INR check 01/25/2012   Weekly max dose (mg)  Target end date Indefinite   Indications FACTOR II DEFICIENCY, DVT, HX OF (Resolved), Long term current use of anticoagulant    Anticoagulation Episode Summary              INR check location [facility] Preferred [facility]    Send INR reminders to [facility]   Comments        Provider Role Specialty Phone number   Zoila Shutter, MD  Internal Medicine [phone]        PATIENT INSTRUCTIONS: Patient Instructions  Patient instructed to take medications as defined in the Anti-coagulation Track section of this encounter.  Patient instructed to take today's dose.  Patient verbalized understanding of these instructions.        FOLLOW-UP Return in 3 weeks (on  01/25/2012), or Follow up INR at 1030h.  Hulen Luster, III Pharm.D., CACP

## 2012-01-04 NOTE — Patient Instructions (Signed)
Patient instructed to take medications as defined in the Anti-coagulation Track section of this encounter.  Patient instructed to take today's dose.  Patient verbalized understanding of these instructions.    

## 2012-01-25 ENCOUNTER — Ambulatory Visit (INDEPENDENT_AMBULATORY_CARE_PROVIDER_SITE_OTHER): Payer: Medicare Other | Admitting: Pharmacist

## 2012-01-25 DIAGNOSIS — D682 Hereditary deficiency of other clotting factors: Secondary | ICD-10-CM

## 2012-01-25 DIAGNOSIS — Z7901 Long term (current) use of anticoagulants: Secondary | ICD-10-CM

## 2012-01-25 NOTE — Progress Notes (Signed)
Anti-Coagulation Progress Note  PHILLIS THACKERAY is a 66 y.o. female who is currently on an anti-coagulation regimen.    RECENT RESULTS: Recent results are below, the most recent result is correlated with a dose of 18 mg. per week: Lab Results  Component Value Date   INR 2.70 01/25/2012   INR 1.40 01/04/2012   INR 3.10 12/21/2011    ANTI-COAG DOSE:   Latest dosing instructions   Total Sun Mon Tue Wed Thu Fri Sat   18 2 mg 2 mg 4 mg 4 mg 2 mg 2 mg 2 mg    (4 mg0.5) (4 mg0.5) (4 mg1) (4 mg1) (4 mg0.5) (4 mg0.5) (4 mg0.5)         ANTICOAG SUMMARY: Anticoagulation Episode Summary              Current INR goal 2.0-3.0 Next INR check 02/22/2012   INR from last check 2.70 (01/25/2012)     Weekly max dose (mg)  Target end date Indefinite   Indications FACTOR II DEFICIENCY, DVT, HX OF (Resolved), Long term current use of anticoagulant   INR check location [facility]    Send INR reminders to [facility]   Comments        Provider Role Specialty Phone number   Zoila Shutter, MD  Internal Medicine [phone]        ANTICOAG TODAY: Anticoagulation Summary as of 01/25/2012              INR goal 2.0-3.0     Selected INR 2.70 (01/25/2012) Next INR check 02/22/2012   Weekly max dose (mg)  Target end date Indefinite   Indications FACTOR II DEFICIENCY, DVT, HX OF (Resolved), Long term current use of anticoagulant    Anticoagulation Episode Summary              INR check location [facility] Preferred lab    Send INR reminders to [facility]   Comments        Provider Role Specialty Phone number   Zoila Shutter, MD  Internal Medicine [phone]        PATIENT INSTRUCTIONS: Patient Instructions  Patient instructed to take medications as defined in the Anti-coagulation Track section of this encounter.  Patient instructed to  today's dose.  Patient verbalized understanding of these instructions.       FOLLOW-UP Return in 4 weeks (on  02/22/2012) for Follow up INR at 1030h.  Hulen Luster, III Pharm.D., CACP

## 2012-01-25 NOTE — Patient Instructions (Signed)
Patient instructed to take medications as defined in the Anti-coagulation Track section of this encounter.  Patient instructed to  today's dose.  Patient verbalized understanding of these instructions.     

## 2012-02-22 ENCOUNTER — Ambulatory Visit (INDEPENDENT_AMBULATORY_CARE_PROVIDER_SITE_OTHER): Payer: Medicare Other | Admitting: Pharmacist

## 2012-02-22 ENCOUNTER — Encounter: Payer: Medicare Other | Admitting: Internal Medicine

## 2012-02-22 DIAGNOSIS — D682 Hereditary deficiency of other clotting factors: Secondary | ICD-10-CM

## 2012-02-22 DIAGNOSIS — Z7901 Long term (current) use of anticoagulants: Secondary | ICD-10-CM

## 2012-02-22 NOTE — Progress Notes (Signed)
Anti-Coagulation Progress Note  Pamela [name] is a 66 y.o. female who is currently on an anti-coagulation regimen.    RECENT RESULTS: Recent results are below, the most recent result is correlated with a dose of 18 mg. per week: Lab Results  Component Value Date   INR 1.80 02/22/2012   INR 2.70 01/25/2012   INR 1.40 01/04/2012    ANTI-COAG DOSE:   Latest dosing instructions   Total Sun Mon Tue Wed Thu Fri Sat   22 4 mg 2 mg 4 mg 2 mg 4 mg 2 mg 4 mg    (4 mg1) (4 mg0.5) (4 mg1) (4 mg0.5) (4 mg1) (4 mg0.5) (4 mg1)         ANTICOAG SUMMARY: Anticoagulation Episode Summary              Current INR goal 2.0-3.0 Next INR check 03/14/2012   INR from last check 1.80! (02/22/2012)     Weekly max dose (mg)  Target end date Indefinite   Indications FACTOR II DEFICIENCY, DVT, HX OF (Resolved), Long term current use of anticoagulant   INR check location [facility] Pref[facility]    Send INR reminders to [facility]   Comments        Provider Role Specialty Phone number   Zoila Shutter, MD  Internal Medicine [phone]        ANTICOAG TODAY: Anticoagulation Summary as of 02/22/2012              INR goal 2.0-3.0     Selected INR 1.80! (02/22/2012) Next INR check 03/14/2012   Weekly max dose (mg)  Target end date Indefinite   Indications FACTOR II DEFICIENCY, DVT, HX OF (Resolved), Long term current use of anticoagulant    Anticoagulation Episode Summary              INR check location [facility] Preferred lab    Send INR reminders to [facility]   Comments        Provider Role Specialty Phone number   Zoila Shutter, MD  Internal Medicine [phone]        PATIENT INSTRUCTIONS: Patient Instructions  Patient instructed to take medications as defined in the Anti-coagulation Track section of this encounter.  Patient instructed to take today's dose.  Patient verbalized understanding of these instructions.        FOLLOW-UP Return in 3 weeks (on  03/14/2012) for Follow up INR at 1030h.  Hulen Luster, III Pharm.D., CACP

## 2012-02-22 NOTE — Patient Instructions (Signed)
Patient instructed to take medications as defined in the Anti-coagulation Track section of this encounter.  Patient instructed to take today's dose.  Patient verbalized understanding of these instructions.    

## 2012-02-24 ENCOUNTER — Other Ambulatory Visit: Payer: Self-pay | Admitting: *Deleted

## 2012-02-24 DIAGNOSIS — K219 Gastro-esophageal reflux disease without esophagitis: Secondary | ICD-10-CM

## 2012-02-25 ENCOUNTER — Encounter: Payer: Medicare Other | Admitting: Internal Medicine

## 2012-03-02 ENCOUNTER — Ambulatory Visit (INDEPENDENT_AMBULATORY_CARE_PROVIDER_SITE_OTHER): Payer: Medicare Other | Admitting: Internal Medicine

## 2012-03-02 ENCOUNTER — Encounter: Payer: Self-pay | Admitting: Internal Medicine

## 2012-03-02 VITALS — BP 134/84 | HR 81 | Temp 99.1°F | Ht 65.75 in | Wt 197.2 lb

## 2012-03-02 DIAGNOSIS — E785 Hyperlipidemia, unspecified: Secondary | ICD-10-CM

## 2012-03-02 DIAGNOSIS — M949 Disorder of cartilage, unspecified: Secondary | ICD-10-CM

## 2012-03-02 DIAGNOSIS — K9089 Other intestinal malabsorption: Secondary | ICD-10-CM

## 2012-03-02 DIAGNOSIS — R197 Diarrhea, unspecified: Secondary | ICD-10-CM

## 2012-03-02 DIAGNOSIS — D539 Nutritional anemia, unspecified: Secondary | ICD-10-CM

## 2012-03-02 DIAGNOSIS — D509 Iron deficiency anemia, unspecified: Secondary | ICD-10-CM

## 2012-03-02 DIAGNOSIS — M899 Disorder of bone, unspecified: Secondary | ICD-10-CM

## 2012-03-02 DIAGNOSIS — E039 Hypothyroidism, unspecified: Secondary | ICD-10-CM

## 2012-03-02 DIAGNOSIS — E663 Overweight: Secondary | ICD-10-CM

## 2012-03-02 DIAGNOSIS — F329 Major depressive disorder, single episode, unspecified: Secondary | ICD-10-CM

## 2012-03-02 DIAGNOSIS — R030 Elevated blood-pressure reading, without diagnosis of hypertension: Secondary | ICD-10-CM

## 2012-03-02 HISTORY — DX: Other intestinal malabsorption: K90.89

## 2012-03-02 LAB — CBC WITH DIFFERENTIAL/PLATELET
HCT: 41.1 % (ref 36.0–46.0)
Hemoglobin: 14.5 g/dL (ref 12.0–15.0)
Lymphocytes Relative: 22 % (ref 12–46)
Lymphs Abs: 1.9 10*3/uL (ref 0.7–4.0)
MCHC: 35.3 g/dL (ref 30.0–36.0)
Monocytes Absolute: 0.8 10*3/uL (ref 0.1–1.0)
Monocytes Relative: 9 % (ref 3–12)
Neutro Abs: 5.8 10*3/uL (ref 1.7–7.7)
RBC: 4.38 MIL/uL (ref 3.87–5.11)

## 2012-03-02 NOTE — Assessment & Plan Note (Signed)
Blood pressure well controlled on no medication

## 2012-03-02 NOTE — Assessment & Plan Note (Signed)
We will obtain TSH today for possible changes in management.

## 2012-03-02 NOTE — Assessment & Plan Note (Signed)
Chronic . Unclear etiology. I will refer patient to Gastroenterology fur further evaluation and managment. Her last colonoscopy was 9 years ago.

## 2012-03-02 NOTE — Assessment & Plan Note (Signed)
Stopped taking Zoloft for month and has been doing fine.

## 2012-03-02 NOTE — Assessment & Plan Note (Signed)
We will obtain CBC to monitor Hgb

## 2012-03-02 NOTE — Progress Notes (Signed)
Subjective:   Patient ID: Pamela [name] female   DOB: 17-Apr-1946 66 y.o.   MRN: [id number]  HPI: [name] is a 66 y.o. female with PMH significant as outlined below who presented to the clinic for a regular office visit. Patient reports that she has been fine since the last visit 1 year ago except she reports about diarrhea.  Diarrhea: Has been present since 2004 after cholecystectomy. She has been daily loose /watery/non-bloody stool mostly in the morning. If she has any schedule changes she would experience constipation. Denies any sick contact or changes in diet. She has been on cruises to the [place] since 2011 on multiple occasion and once in [place] but the diarrhea has started earlier.  She did not notice any weight loss . She has not tried anything over the counter.   Past Medical History  Diagnosis Date  . HYPERLIPIDEMIA 06/03/2006  . OBESITY, MILD 04/24/2008  . GLAUCOMA 04/24/2008  . CHOLELITHIASIS, WITH OBSTRUCTION 04/21/2006    s/p ERCP,sprincterotomy, stent ([facility])  . 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 Hypothyroidism lifelong  . Tenosynovitis 01/2004    [facility]   Current Outpatient Prescriptions  Medication Sig Dispense Refill  . bimatoprost (LUMIGAN) 0.03 % ophthalmic drops Apply 1 drop to eye at bedtime.        [name] levothyroxine (LEVOTHROID) 100 MCG tablet Take 1 tablet (100 mcg total) by mouth daily.  90 tablet  2  . Multiple Vitamins-Calcium (DAILY COMBO MULTIVITS/CALCIUM) TABS Take 1 tablet by mouth daily.        . pantoprazole (PROTONIX) 40 MG tablet Take 1 tablet (40 mg total) by mouth daily.  31 tablet  11  . sertraline (ZOLOFT) 50 MG tablet Take 1 tablet (50 mg total) by mouth daily.  90 tablet  3  . warfarin (COUMADIN) 4 MG tablet Use as directed by anticoagulation clinic provider. Patient indicates preference for GENERIC warfarin which is authorized.  30 tablet  11   Family History  Problem Relation Age of Onset    . Cancer Mother     H&N, smoker  . Cancer Sister     lung, 2011   History   Social History  . Marital Status: Married    Spouse Name: N/A    Number of Children: N/A  . Years of Education: N/A   Occupational History  . 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 Smoking status: Former Smoker -- 1.0 packs/day for 20 years    Quit date: 07/06/1985  . Smokeless tobacco: None   Comment: Quit 1988  . Alcohol Use: Yes     2 glasses of wine daily.  . Drug Use: No  . Sexually Active: None   Other Topics Concern  . None   Social History Narrative   Married, no regular exercise.   Review of Systems: Constitutional: Denies fever, chills, diaphoresis, appetite change and fatigue.  Respiratory: Denies SOB, DOE, cough, chest tightness,  and wheezing.   Cardiovascular: Denies chest pain, palpitations and leg swelling.  Gastrointestinal: Denies nausea, vomiting, abdominal pain, blood in stool and abdominal distention.  Genitourinary: Denies dysuria, urgency, frequency, hematuria, flank pain and difficulty urinating.  Neurological: Denies dizziness,light-headedness,  and headaches.    Objective:  Physical Exam: Filed Vitals:   03/02/12 1609  BP: 145/87  Pulse: 86  Temp: 99.1 F (37.3 C)  TempSrc: Oral  Height: 5' 5.75" (1.67  m)  Weight: 197 lb 3.2 oz (89.449 kg)  SpO2: 94%   Constitutional: Vital signs reviewed.  Patient is a well-developed and well-nourished  in no acute distress and cooperative with exam. Alert and oriented x3.  Neck: Supple,  Cardiovascular: RRR, S1 normal, S2 normal, no MRG, pulses symmetric and intact bilaterally Pulmonary/Chest: CTAB, no wheezes, rales, or rhonchi Abdominal: Soft. Non-tender, non-distended, bowel sounds are normal, Neurological: A&O x3,  Skin: Warm, dry and intact.  Psychiatric: Normal mood and affect. speech and behavior is normal.

## 2012-03-02 NOTE — Assessment & Plan Note (Signed)
We will obtain Lipid panel for possible changes in management.

## 2012-03-03 ENCOUNTER — Telehealth: Payer: Self-pay | Admitting: *Deleted

## 2012-03-03 ENCOUNTER — Other Ambulatory Visit: Payer: Self-pay | Admitting: Internal Medicine

## 2012-03-03 DIAGNOSIS — K219 Gastro-esophageal reflux disease without esophagitis: Secondary | ICD-10-CM

## 2012-03-03 LAB — COMPREHENSIVE METABOLIC PANEL
ALT: 15 U/L (ref 0–35)
AST: 19 U/L (ref 0–37)
CO2: 26 mEq/L (ref 19–32)
Calcium: 9.6 mg/dL (ref 8.4–10.5)
Chloride: 102 mEq/L (ref 96–112)
Potassium: 4.1 mEq/L (ref 3.5–5.3)
Sodium: 138 mEq/L (ref 135–145)
Total Protein: 7.4 g/dL (ref 6.0–8.3)

## 2012-03-03 LAB — LIPID PANEL
LDL Cholesterol: 119 mg/dL — ABNORMAL HIGH (ref 0–99)
Triglycerides: 131 mg/dL (ref <150)

## 2012-03-03 LAB — HEMOGLOBIN A1C
Hgb A1c MFr Bld: 5.2 % (ref <5.7)
Mean Plasma Glucose: 103 mg/dL (ref <117)

## 2012-03-03 MED ORDER — PANTOPRAZOLE SODIUM 40 MG PO TBEC: 40.0000 mg | DELAYED_RELEASE_TABLET | Freq: Every day | ORAL | Status: DC

## 2012-03-03 NOTE — Telephone Encounter (Signed)
Returned pt's call.  Pt stated Dr Loistine Chance had left a message for her to call about lab results. Message sent to Dr Loistine Chance.  Pt's cell# [phone].

## 2012-03-04 ENCOUNTER — Telehealth: Payer: Self-pay | Admitting: Internal Medicine

## 2012-03-04 NOTE — Telephone Encounter (Signed)
Called patient with lab results.  

## 2012-03-14 ENCOUNTER — Ambulatory Visit (INDEPENDENT_AMBULATORY_CARE_PROVIDER_SITE_OTHER): Payer: Medicare Other | Admitting: Pharmacist

## 2012-03-14 DIAGNOSIS — Z7901 Long term (current) use of anticoagulants: Secondary | ICD-10-CM

## 2012-03-14 DIAGNOSIS — D682 Hereditary deficiency of other clotting factors: Secondary | ICD-10-CM

## 2012-03-14 LAB — POCT INR: INR: 3.2

## 2012-03-14 NOTE — Progress Notes (Signed)
Anti-Coagulation Progress Note  Pamela [name] is a 66 y.o. female who is currently on an anti-coagulation regimen.    RECENT RESULTS: Recent results are below, the most recent result is correlated with a dose of 22 mg. per week: Lab Results  Component Value Date   INR 3.2 03/14/2012   INR 1.80 02/22/2012   INR 2.70 01/25/2012    ANTI-COAG DOSE:   Latest dosing instructions   Total Sun Mon Tue Wed Thu Fri Sat   20 4 mg 2 mg 2 mg 2 mg 4 mg 2 mg 4 mg    (4 mg1) (4 mg0.5) (4 mg0.5) (4 mg0.5) (4 mg1) (4 mg0.5) (4 mg1)         ANTICOAG SUMMARY: Anticoagulation Episode Summary              Current INR goal 2.0-3.0 Next INR check 04/11/2012   INR from last check 3.2! (03/14/2012)     Weekly max dose (mg)  Target end date Indefinite   Indications FACTOR II DEFICIENCY, DVT, HX OF (Resolved), Long term current use of anticoagulant   [facility]R check location [facility]    Send INR reminders to [facility]   Comments        Provider Role Specialty Phone number   Zoila Shutter, MD  Internal Medicine [phone]        ANTICOAG TODAY: Anticoagulation Summary as of 03/14/2012              INR goal 2.0-3.0     Selected INR 3.2! (03/14/2012) Next INR check 04/11/2012   Weekly max dose (mg)  Target end date Indefinite   Indications FACTOR II DEFICIENCY, DVT, HX OF (Resolved), Long term current use of anticoagulant    Anticoagulation Episode Summary              INR check location [facility]    Send INR reminders to [facility]   Comments        Provider Role Specialty Phone number   Zoila Shutter, MD  Internal Medicine [phone]        PATIENT INSTRUCTIONS: Patient Instructions  Patient instructed to take medications as defined in the Anti-coagulation Track section of this encounter.  Patient instructed to take today's dose.  Patient verbalized understanding of these instructions.        FOLLOW-UP Return in 4 weeks (on  04/11/2012) for Follow up INR at 1115h.  Hulen Luster, III Pharm.D., CACP

## 2012-03-14 NOTE — Patient Instructions (Signed)
Patient instructed to take medications as defined in the Anti-coagulation Track section of this encounter.  Patient instructed to take today's dose.  Patient verbalized understanding of these instructions.    

## 2012-04-08 ENCOUNTER — Ambulatory Visit (HOSPITAL_COMMUNITY)
Admission: RE | Admit: 2012-04-08 | Discharge: 2012-04-08 | Disposition: A | Discharge status: Home / Self Care | Payer: Medicare Other | Source: Ambulatory Visit | Attending: Ophthalmology | Admitting: Ophthalmology

## 2012-04-08 ENCOUNTER — Other Ambulatory Visit (HOSPITAL_COMMUNITY): Payer: Self-pay | Admitting: Ophthalmology

## 2012-04-08 DIAGNOSIS — IMO0002 Reserved for concepts with insufficient information to code with codable children: Secondary | ICD-10-CM

## 2012-04-08 DIAGNOSIS — R229 Localized swelling, mass and lump, unspecified: Secondary | ICD-10-CM | POA: Insufficient documentation

## 2012-04-11 ENCOUNTER — Encounter: Payer: Self-pay | Admitting: Internal Medicine

## 2012-04-11 ENCOUNTER — Ambulatory Visit (INDEPENDENT_AMBULATORY_CARE_PROVIDER_SITE_OTHER): Payer: Medicare Other | Admitting: Pharmacist

## 2012-04-11 DIAGNOSIS — D682 Hereditary deficiency of other clotting factors: Secondary | ICD-10-CM

## 2012-04-11 DIAGNOSIS — Z7901 Long term (current) use of anticoagulants: Secondary | ICD-10-CM

## 2012-04-11 NOTE — Patient Instructions (Signed)
Patient instructed to take medications as defined in the Anti-coagulation Track section of this encounter.  Patient instructed to take today's dose.  Patient verbalized understanding of these instructions.    

## 2012-04-11 NOTE — Progress Notes (Signed)
Agree with plan 

## 2012-04-11 NOTE — Progress Notes (Signed)
Anti-Coagulation Progress Note  Pamela [name] is a 66 y.o. female who is currently on an anti-coagulation regimen.    RECENT RESULTS: Recent results are below, the most recent result is correlated with a dose of 20 mg. per week: Lab Results  Component Value Date   INR 2.90 04/11/2012   INR 3.2 03/14/2012   INR 1.80 02/22/2012    ANTI-COAG DOSE:   Latest dosing instructions   Total Sun Mon Tue Wed Thu Fri Sat   20 4 mg 2 mg 2 mg 2 mg 4 mg 2 mg 4 mg    (4 mg1) (4 mg0.5) (4 mg0.5) (4 mg0.5) (4 mg1) (4 mg0.5) (4 mg1)         ANTICOAG SUMMARY: Anticoagulation Episode Summary              Current INR goal 2.0-3.0 Next INR check 05/09/2012   INR from last check 2.90 (04/11/2012)     Weekly max dose (mg)  Target end date Indefinite   Indications FACTOR II DEFICIENCY, DVT, HX OF (Resolved), Long term current use of anticoagulant   INR check location [facility]    Send INR reminders to [facility]   Comments        Provider Role Specialty Phone number   Zoila Shutter, MD  Internal Medicine [phone]        ANTICOAG TODAY: Anticoagulation Summary as of 04/11/2012              INR goal 2.0-3.0     Selected INR 2.90 (04/11/2012) Next INR check 05/09/2012   Weekly max dose (mg)  Target end date Indefinite   Indications FACTOR II DEFICIENCY, DVT, HX OF (Resolved), Long term current use of anticoagulant    Anticoagulation Episode Summary              INR check location [facility]    Send INR reminders to [facility]   Comments        Provider Role Specialty Phone number   Zoila Shutter, MD  Internal Medicine [phone]        PATIENT INSTRUCTIONS: Patient Instructions  Patient instructed to take medications as defined in the Anti-coagulation Track section of this encounter.  Patient instructed to take today's dose.  Patient verbalized understanding of these instructions.        FOLLOW-UP Return in 4 weeks (on  05/09/2012) for Follow up INR at 1000h.  Hulen Luster, III Pharm.D., CACP

## 2012-04-12 ENCOUNTER — Other Ambulatory Visit: Payer: Self-pay | Admitting: *Deleted

## 2012-04-12 DIAGNOSIS — Z7901 Long term (current) use of anticoagulants: Secondary | ICD-10-CM

## 2012-04-12 DIAGNOSIS — Z86718 Personal history of other venous thrombosis and embolism: Secondary | ICD-10-CM

## 2012-04-12 DIAGNOSIS — D682 Hereditary deficiency of other clotting factors: Secondary | ICD-10-CM

## 2012-04-12 MED ORDER — WARFARIN SODIUM 4 MG PO TABS: ORAL_TABLET | ORAL | Status: DC

## 2012-04-12 NOTE — Telephone Encounter (Signed)
Last visit with Dr Alexandria Lodge 03/14/12

## 2012-04-15 ENCOUNTER — Encounter (HOSPITAL_COMMUNITY): Payer: Self-pay | Admitting: *Deleted

## 2012-04-15 ENCOUNTER — Emergency Department (HOSPITAL_COMMUNITY)
Admission: EM | Admit: 2012-04-15 | Discharge: 2012-04-15 | Disposition: A | Discharge status: Home / Self Care | Payer: Medicare Other | Source: Home / Self Care

## 2012-04-15 DIAGNOSIS — H6120 Impacted cerumen, unspecified ear: Secondary | ICD-10-CM

## 2012-04-15 NOTE — ED Provider Notes (Signed)
History     CSN: [id number]  Arrival date & time 04/15/12  1119   None     Chief Complaint  Patient presents with  . Otalgia    (Consider location/radiation/quality/duration/timing/severity/associated sxs/prior treatment) HPI Comments: 66 year old female with mild discomfort in the right ear. She states that she has wax in her ears. This morning after taking a shower she  tried to clean it out with a Q-tip and then everything went silent in the ear. Denies sore throat or ear ache or fever.   Patient is a 66 y.o. female presenting with ear pain.  Otalgia Associated symptoms include hearing loss. Pertinent negatives include no ear discharge.    Past Medical History  Diagnosis Date  . HYPERLIPIDEMIA 06/03/2006  . OBESITY, MILD 04/24/2008  . GLAUCOMA 04/24/2008  . CHOLELITHIASIS, WITH OBSTRUCTION 04/21/2006    s/p ERCP,sprincterotomy, stent ([facility])  . 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 Hypothyroidism lifelong  . Tenosynovitis 01/2004    [facility]    Past Surgical History  Procedure Date  . Cholecystectomy     2004    Family History  Problem Relation Age of Onset  . Cancer Mother     H&N, smoker  . Cancer Sister     lung, 2011    History  Substance Use Topics  . Smoking status: Former Smoker -- 1.0 packs/day for 20 years    Quit date: 07/06/1985  . Smokeless tobacco: Not on file   Comment: Quit 1988  . Alcohol Use: Yes     2 glasses of wine daily.    OB History    Grav Para Term Preterm Abortions TAB SAB Ect Mult Living                  Review of Systems  Constitutional: Negative for fever, activity change and fatigue.  HENT: Positive for hearing loss, ear pain and postnasal drip. Negative for neck stiffness and ear discharge.   Eyes: Negative.   Respiratory: Negative.     Allergies  Review of patient's allergies indicates no known allergies.  Home Medications   Current Outpatient Rx  Name Route Sig Dispense  Refill  . LEVOTHYROXINE SODIUM 100 MCG PO TABS Oral Take 1 tablet (100 mcg total) by mouth daily. 90 tablet 2  . DAILY COMBO MULTIVITS/CALCIUM PO TABS Oral Take 1 tablet by mouth daily.      [name] PANTOPRAZOLE SODIUM 40 MG PO TBEC Oral Take 1 tablet (40 mg total) by mouth daily. 30 tablet 1  . WARFARIN SODIUM 4 MG PO TABS  Use as directed by anticoagulation clinic provider. Patient indicates preference for GENERIC warfarin which is authorized. 30 tablet 0    Patient cites a preference for [employer].    BP 164/85  Pulse 78  Temp 98.1 F (36.7 C) (Oral)  Resp 18  SpO2 99%  Physical Exam  Constitutional: She is oriented to person, place, and time. She appears well-developed and well-nourished.  HENT:  Head: Normocephalic and atraumatic.  Right Ear: External ear normal.  Left Ear: External ear normal.  Mouth/Throat: Oropharynx is clear and moist.       By the time I saw the patient her ears had already been irrigated. EACs are clear there is no erythema or cerumen visible. TMs are pearly gray, no erythema, normal light reflex  Eyes: EOM are normal. Pupils are equal, round, and reactive to light.  Neck: Normal range of motion. Neck supple.  Pulmonary/Chest: Effort normal.  Lymphadenopathy:    She has no cervical adenopathy.  Neurological: She is alert and oriented to person, place, and time. No cranial nerve deficit.  Skin: Skin is warm.    ED Course  Procedures (including critical care time)  Labs Reviewed - No data to display No results found.   1. Cerumen impaction       MDM  Irrigation of both ears and all cerumen was removed. Her hearing has been restored, and she feels well        Hayden Rasmussen, NP 04/15/12 1219

## 2012-04-15 NOTE — ED Notes (Signed)
Pt  Reports  Symptoms  Of  r  Earache   With  Possible  Wax  buidup      Symptoms  Began  Yesterday

## 2012-04-16 NOTE — ED Provider Notes (Signed)
Medical screening examination/treatment/procedure(s) were performed by resident physician or non-physician practitioner and as supervising physician I was immediately available for consultation/collaboration.   Barkley Bruns MD.    Linna Hoff, MD 04/16/12 1730

## 2012-05-04 ENCOUNTER — Other Ambulatory Visit: Payer: Self-pay | Admitting: *Deleted

## 2012-05-04 DIAGNOSIS — K219 Gastro-esophageal reflux disease without esophagitis: Secondary | ICD-10-CM

## 2012-05-04 MED ORDER — PANTOPRAZOLE SODIUM 40 MG PO TBEC: 40.0000 mg | DELAYED_RELEASE_TABLET | Freq: Every day | ORAL | Status: DC

## 2012-05-09 ENCOUNTER — Ambulatory Visit (INDEPENDENT_AMBULATORY_CARE_PROVIDER_SITE_OTHER): Payer: Medicare Other | Admitting: Pharmacist

## 2012-05-09 DIAGNOSIS — Z7901 Long term (current) use of anticoagulants: Secondary | ICD-10-CM

## 2012-05-09 DIAGNOSIS — D682 Hereditary deficiency of other clotting factors: Secondary | ICD-10-CM

## 2012-05-09 NOTE — Progress Notes (Signed)
Anti-Coagulation Progress Note  Pamela [name] is a 66 y.o. female who is currently on an anti-coagulation regimen.    RECENT RESULTS: Recent results are below, the most recent result is correlated with a dose of 20 mg. per week: Lab Results  Component Value Date   INR 2.60 05/09/2012   INR 2.90 04/11/2012   INR 3.2 03/14/2012    ANTI-COAG DOSE:   Latest dosing instructions   Total Sun Mon Tue Wed Thu Fri Sat   20 4 mg 2 mg 2 mg 2 mg 4 mg 2 mg 4 mg    (4 mg1) (4 mg0.5) (4 mg0.5) (4 mg0.5) (4 mg1) (4 mg0.5) (4 mg1)         ANTICOAG SUMMARY: Anticoagulation Episode Summary              Current INR goal 2.0-3.0 Next INR check 06/06/2012   INR from last check 2.60 (05/09/2012)     Weekly max dose (mg)  Target end date Indefinite   Indications FACTOR II DEFICIENCY [286.3], DVT, HX OF (Resolved) [V12.51], Long term current use of anticoagulant [V58.61]   INR check location [facility]    Send INR reminders to ANTICOAG IMP   Comments        Provider Role Specialty Phone number   Zoila Shutter, MD  Internal Medicine [phone]        ANTICOAG TODAY: Anticoagulation Summary as of 05/09/2012              INR goal 2.0-3.0     Selected INR 2.60 (05/09/2012) Next INR check 06/06/2012   Weekly max dose (mg)  Target end date Indefinite   Indications FACTOR II DEFICIENCY [286.3], DVT, HX OF (Resolved) [V12.51], Long term current use of anticoagulant [V58.61]    Anticoagulation Episode Summary              INR check location [facility] Preferred lab    Send INR reminders to ANTICOAG IMP   Comments        Provider Role Specialty Phone number   Zoila Shutter, MD  Internal Medicine [phone]        PATIENT INSTRUCTIONS: Patient Instructions  Patient instructed to take medications as defined in the Anti-coagulation Track section of this encounter.  Patient instructed to take today's dose.  Patient verbalized understanding of these  instructions.        FOLLOW-UP Return in 4 weeks (on 06/06/2012) for Follow up INR at 1000h.  Hulen Luster, III Pharm.D., CACP

## 2012-05-09 NOTE — Patient Instructions (Signed)
Patient instructed to take medications as defined in the Anti-coagulation Track section of this encounter.  Patient instructed to take today's dose.  Patient verbalized understanding of these instructions.    

## 2012-05-17 ENCOUNTER — Emergency Department (INDEPENDENT_AMBULATORY_CARE_PROVIDER_SITE_OTHER): Payer: Medicare Other

## 2012-05-17 ENCOUNTER — Emergency Department (HOSPITAL_COMMUNITY)
Admission: EM | Admit: 2012-05-17 | Discharge: 2012-05-17 | Disposition: A | Discharge status: Home / Self Care | Payer: Medicare Other | Source: Home / Self Care | Attending: Emergency Medicine | Admitting: Emergency Medicine

## 2012-05-17 ENCOUNTER — Encounter (HOSPITAL_COMMUNITY): Payer: Self-pay | Admitting: Emergency Medicine

## 2012-05-17 DIAGNOSIS — S93609A Unspecified sprain of unspecified foot, initial encounter: Secondary | ICD-10-CM

## 2012-05-17 DIAGNOSIS — Z7901 Long term (current) use of anticoagulants: Secondary | ICD-10-CM

## 2012-05-17 DIAGNOSIS — D682 Hereditary deficiency of other clotting factors: Secondary | ICD-10-CM

## 2012-05-17 DIAGNOSIS — S93601A Unspecified sprain of right foot, initial encounter: Secondary | ICD-10-CM

## 2012-05-17 MED ORDER — IBUPROFEN 800 MG PO TABS: 800.0000 mg | ORAL_TABLET | Freq: Three times a day (TID) | ORAL | Status: DC

## 2012-05-17 MED ORDER — HYDROCODONE-ACETAMINOPHEN 5-325 MG PO TABS: 2.0000 | ORAL_TABLET | ORAL | Status: DC | PRN

## 2012-05-17 NOTE — ED Notes (Signed)
Pt c/o right ankle inj... Pt missed last step from her patio door around 20:30... Did not completely fall but did twist her right ankle... Sx include: pain on lateral side of right ankle, bruising, swelling, pain when she applies pressure... Pt is alert w/no signs of distress.

## 2012-05-17 NOTE — ED Provider Notes (Signed)
Medical screening examination/treatment/procedure(s) were performed by non-physician practitioner and as supervising physician I was immediately available for consultation/collaboration.  Raynald Blend, MD 05/17/12 1434

## 2012-05-17 NOTE — ED Provider Notes (Signed)
History     CSN: [id number]  Arrival date & time 05/17/12  1213   First MD Initiated Contact with Patient 05/17/12 1330      Chief Complaint  Patient presents with  . Foot Injury    (Consider location/radiation/quality/duration/timing/severity/associated sxs/prior treatment) Patient is a 66 y.o. female presenting with foot injury. The history is provided by the patient. No language interpreter was used.  Foot Injury  The incident occurred yesterday. The incident occurred at home. The injury mechanism was torsion. The pain is present in the right foot. The quality of the pain is described as aching. The pain is mild. The pain has been constant since onset. Pertinent negatives include no numbness, no muscle weakness and no tingling. She reports no foreign bodies present. She has tried nothing for the symptoms. The treatment provided no relief.    Past Medical History  Diagnosis Date  . HYPERLIPIDEMIA 06/03/2006  . OBESITY, MILD 04/24/2008  . GLAUCOMA 04/24/2008  . CHOLELITHIASIS, WITH OBSTRUCTION 04/21/2006    s/p ERCP,sprincterotomy, stent ([facility])  . 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 Hypothyroidism lifelong  . Tenosynovitis 01/2004    [facility]    Past Surgical History  Procedure Date  . Cholecystectomy     2004    Family History  Problem Relation Age of Onset  . Cancer Mother     H&N, smoker  . Cancer Sister     lung, 2011    History  Substance Use Topics  . Smoking status: Former Smoker -- 1.0 packs/day for 20 years    Quit date: 07/06/1985  . Smokeless tobacco: Not on file     Comment: Quit 1988  . Alcohol Use: Yes     Comment: 2 glasses of wine daily.    OB History    Grav Para Term Preterm Abortions TAB SAB Ect Mult Living                  Review of Systems  Musculoskeletal: Positive for myalgias and joint swelling.  Neurological: Negative for tingling and numbness.  All other systems reviewed and are  negative.    Allergies  Review of patient's allergies indicates no known allergies.  Home Medications   Current Outpatient Rx  Name  Route  Sig  Dispense  Refill  . COLESTIPOL HCL 1 G PO TABS   Oral   Take 1 g by mouth 2 (two) times daily.         [name] LEVOTHYROXINE SODIUM 100 MCG PO TABS   Oral   Take 1 tablet (100 mcg total) by mouth daily.   90 tablet   2   . DAILY COMBO MULTIVITS/CALCIUM PO TABS   Oral   Take 1 tablet by mouth daily.           [name] PANTOPRAZOLE SODIUM 40 MG PO TBEC   Oral   Take 1 tablet (40 mg total) by mouth daily.   90 tablet   0   . WARFARIN SODIUM 4 MG PO TABS      Use as directed by anticoagulation clinic provider. Patient indicates preference for GENERIC warfarin which is authorized.   30 tablet   0     Patient cites a preference for [employer].     BP 173/84  Pulse 82  Temp 98.8 F (37.1 C) (Oral)  Resp 18  SpO2 98%  Physical Exam  Nursing note and vitals reviewed. Constitutional: She is oriented to  person, place, and time. She appears well-developed and well-nourished.  Musculoskeletal: She exhibits tenderness.       Tender base of 5th metacarpal,  From  nv and ns intact  Neurological: She is alert and oriented to person, place, and time.  Skin: Skin is warm.  Psychiatric: She has a normal mood and affect.    ED Course  Procedures (including critical care time)  Labs Reviewed - No data to display Dg Foot Complete Right  05/17/2012  *RADIOLOGY REPORT*  Clinical Data: Foot injury  RIGHT FOOT COMPLETE - 3+ VIEW  Comparison: 07/27/2006  Findings: Three views of the right foot submitted.  No acute fracture or subluxation.  Mild degenerative changes distal aspect first metatarsal.  Metallic fixation material noted distal fibula.  IMPRESSION: No acute fracture or subluxation.  Mild degenerative changes distal aspect first metatarsal.   Original Report Authenticated By: Natasha Mead, M.D.      No diagnosis found.    MDM   Ace wrap and post op shoe, Pt advised to follow up with Ortho if pain persist past one week       Elson Areas, [place] 05/17/12 1421  Lonia Skinner [place], [place] 05/17/12 1432

## 2012-05-19 ENCOUNTER — Other Ambulatory Visit: Payer: Self-pay | Admitting: *Deleted

## 2012-05-19 DIAGNOSIS — E039 Hypothyroidism, unspecified: Secondary | ICD-10-CM

## 2012-05-19 MED ORDER — LEVOTHYROXINE SODIUM 100 MCG PO TABS: 100.0000 ug | ORAL_TABLET | Freq: Every day | ORAL | Status: DC

## 2012-05-25 ENCOUNTER — Other Ambulatory Visit: Payer: Self-pay | Admitting: *Deleted

## 2012-05-25 DIAGNOSIS — D682 Hereditary deficiency of other clotting factors: Secondary | ICD-10-CM

## 2012-05-25 DIAGNOSIS — Z7901 Long term (current) use of anticoagulants: Secondary | ICD-10-CM

## 2012-05-25 DIAGNOSIS — Z86718 Personal history of other venous thrombosis and embolism: Secondary | ICD-10-CM

## 2012-05-25 MED ORDER — WARFARIN SODIUM 4 MG PO TABS: ORAL_TABLET | ORAL | Status: DC

## 2012-06-06 ENCOUNTER — Ambulatory Visit (INDEPENDENT_AMBULATORY_CARE_PROVIDER_SITE_OTHER): Payer: Medicare Other | Admitting: Pharmacist

## 2012-06-06 DIAGNOSIS — Z7901 Long term (current) use of anticoagulants: Secondary | ICD-10-CM

## 2012-06-06 DIAGNOSIS — D682 Hereditary deficiency of other clotting factors: Secondary | ICD-10-CM

## 2012-06-06 NOTE — Progress Notes (Signed)
Agree with Dr. Groce's Plan. 

## 2012-06-06 NOTE — Progress Notes (Signed)
Anti-Coagulation Progress Note  Pamela [name] is a 66 y.o. female who is currently on an anti-coagulation regimen.    RECENT RESULTS: Recent results are below, the most recent result is correlated with a dose of 20 mg. per week: Lab Results  Component Value Date   INR 2.80 06/06/2012   INR 2.60 05/09/2012   INR 2.90 04/11/2012    ANTI-COAG DOSE:   Latest dosing instructions   Total Sun Mon Tue Wed Thu Fri Sat   20 4 mg 2 mg 2 mg 2 mg 4 mg 2 mg 4 mg    (4 mg1) (4 mg0.5) (4 mg0.5) (4 mg0.5) (4 mg1) (4 mg0.5) (4 mg1)         ANTICOAG SUMMARY: Anticoagulation Episode Summary              Current INR goal 2.0-3.0 Next INR check 07/11/2012   INR from last check 2.80 (06/06/2012)     Weekly max dose (mg)  Target end date Indefinite   Indications FACTOR II DEFICIENCY [286.3], DVT, HX OF (Resolved) [V12.51], Long term current use of anticoagulant [V58.61]   [facility] check location [facility] Pref[facility]    Send INR reminders to ANTICOAG IMP   Comments        Provider Role Specialty Phone number   Zoila Shutter, MD  Internal Medicine [phone]        ANTICOAG TODAY: Anticoagulation Summary as of 06/06/2012              INR goal 2.0-3.0     Selected INR 2.80 (06/06/2012) Next INR check 07/11/2012   Weekly max dose (mg)  Target end date Indefinite   Indications FACTOR II DEFICIENCY [286.3], DVT, HX OF (Resolved) [V12.51], Long term current use of anticoagulant [V58.61]    Anticoagulation Episode Summary              INR check location [facility] Preferred lab    Send INR reminders to ANTICOAG IMP   Comments        Provider Role Specialty Phone number   Zoila Shutter, MD  Internal Medicine [phone]        PATIENT INSTRUCTIONS: Patient Instructions  Patient instructed to take medications as defined in the Anti-coagulation Track section of this encounter.  Patient instructed to take today's dose.  Patient verbalized understanding of these  instructions.        FOLLOW-UP Return in 5 weeks (on 07/11/2012) for Follow up INR at 1000h.  Hulen Luster, III Pharm.D., CACP

## 2012-06-06 NOTE — Patient Instructions (Signed)
Patient instructed to take medications as defined in the Anti-coagulation Track section of this encounter.  Patient instructed to take today's dose.  Patient verbalized understanding of these instructions.    

## 2012-07-01 ENCOUNTER — Other Ambulatory Visit: Payer: Self-pay | Admitting: *Deleted

## 2012-07-01 DIAGNOSIS — K219 Gastro-esophageal reflux disease without esophagitis: Secondary | ICD-10-CM

## 2012-07-01 MED ORDER — PANTOPRAZOLE SODIUM 40 MG PO TBEC: 40.0000 mg | DELAYED_RELEASE_TABLET | Freq: Every day | ORAL | Status: DC

## 2012-07-04 ENCOUNTER — Other Ambulatory Visit: Payer: Self-pay | Admitting: Internal Medicine

## 2012-07-07 ENCOUNTER — Other Ambulatory Visit: Payer: Self-pay | Admitting: Internal Medicine

## 2012-07-11 ENCOUNTER — Ambulatory Visit (INDEPENDENT_AMBULATORY_CARE_PROVIDER_SITE_OTHER): Payer: Medicare Other | Admitting: Pharmacist

## 2012-07-11 DIAGNOSIS — D682 Hereditary deficiency of other clotting factors: Secondary | ICD-10-CM

## 2012-07-11 DIAGNOSIS — Z7901 Long term (current) use of anticoagulants: Secondary | ICD-10-CM

## 2012-07-11 NOTE — Progress Notes (Signed)
Anti-Coagulation Progress Note  Pamela [name] is a 67 y.o. female who is currently on an anti-coagulation regimen.    RECENT RESULTS: Recent results are below, the most recent result is correlated with a dose of 20 mg. per week: Lab Results  Component Value Date   INR 2.50 07/11/2012   INR 2.80 06/06/2012   INR 2.60 05/09/2012    ANTI-COAG DOSE:   Latest dosing instructions   Total Sun Mon Tue Wed Thu Fri Sat   20 4 mg 2 mg 2 mg 2 mg 4 mg 2 mg 4 mg    (4 mg1) (4 mg0.5) (4 mg0.5) (4 mg0.5) (4 mg1) (4 mg0.5) (4 mg1)         ANTICOAG SUMMARY: Anticoagulation Episode Summary              Current INR goal 2.0-3.0 Next INR check 08/08/2012   INR from last check 2.50 (07/11/2012)     Weekly max dose (mg)  Target end date Indefinite   Indications FACTOR II DEFICIENCY [286.3], DVT, HX OF (Resolved) [V12.51], Long term current use of anticoagulant [V58.61]   INR check location [facility] Preferred [facility]    Send INR reminders to ANTICOAG IMP   Comments        Provider Role Specialty Phone number   Zoila Shutter, MD  Internal Medicine [phone]        ANTICOAG TODAY: Anticoagulation Summary as of 07/11/2012              INR goal 2.0-3.0     Selected INR 2.50 (07/11/2012) Next INR check 08/08/2012   Weekly max dose (mg)  Target end date Indefinite   Indications FACTOR II DEFICIENCY [286.3], DVT, HX OF (Resolved) [V12.51], Long term current use of anticoagulant [V58.61]    Anticoagulation Episode Summary              INR check location [facility] Preferred [facility]    Send INR reminders to ANTICOAG IMP   Comments        Provider Role Specialty Phone number   Zoila Shutter, MD  Internal Medicine [phone]        PATIENT INSTRUCTIONS: Patient Instructions  Patient instructed to take medications as defined in the Anti-coagulation Track section of this encounter.  Patient instructed to take today's dose.  Patient verbalized understanding of these  instructions.        FOLLOW-UP Return in 4 weeks (on 08/08/2012) for Follow up INR at 1015h.  Hulen Luster, III Pharm.D., CACP

## 2012-07-11 NOTE — Patient Instructions (Signed)
Patient instructed to take medications as defined in the Anti-coagulation Track section of this encounter.  Patient instructed to take today's dose.  Patient verbalized understanding of these instructions.    

## 2012-08-08 ENCOUNTER — Ambulatory Visit (INDEPENDENT_AMBULATORY_CARE_PROVIDER_SITE_OTHER): Payer: Medicare Other | Admitting: Pharmacist

## 2012-08-08 DIAGNOSIS — Z7901 Long term (current) use of anticoagulants: Secondary | ICD-10-CM

## 2012-08-08 DIAGNOSIS — D682 Hereditary deficiency of other clotting factors: Secondary | ICD-10-CM

## 2012-08-08 NOTE — Progress Notes (Signed)
Anti-Coagulation Progress Note  Pamela [name] is a 67 y.o. female who is currently on an anti-coagulation regimen.    RECENT RESULTS: Recent results are below, the most recent result is correlated with a dose of 20 mg. per week: Lab Results  Component Value Date   INR 4.10 08/08/2012   INR 2.50 07/11/2012   INR 2.80 06/06/2012    ANTI-COAG DOSE:   Latest dosing instructions   Total Sun Mon Tue Wed Thu Fri Sat   16 4 mg 2 mg 2 mg 2 mg 2 mg 2 mg 2 mg    (4 mg1) (4 mg0.5) (4 mg0.5) (4 mg0.5) (4 mg0.5) (4 mg0.5) (4 mg0.5)         ANTICOAG SUMMARY: Anticoagulation Episode Summary              Current INR goal 2.0-3.0 Next INR check 08/29/2012   INR from last check 4.10! (08/08/2012)     Weekly max dose (mg)  Target end date Indefinite   Indications FACTOR II DEFICIENCY [286.3], DVT, HX OF (Resolved) [V12.51], Long term current use of anticoagulant [V58.61]   INR check location [facility] Preferred [facility]    Send INR reminders to ANTICOAG IMP   Comments        Provider Role Specialty Phone number   Zoila Shutter, MD  Internal Medicine [phone]        ANTICOAG TODAY: Anticoagulation Summary as of 08/08/2012              INR goal 2.0-3.0     Selected INR 4.10! (08/08/2012) Next INR check 08/29/2012   Weekly max dose (mg)  Target end date Indefinite   Indications FACTOR II DEFICIENCY [286.3], DVT, HX OF (Resolved) [V12.51], Long term current use of anticoagulant [V58.61]    Anticoagulation Episode Summary              INR check location [facility] Preferred [facility]    Send INR reminders to ANTICOAG IMP   Comments        Provider Role Specialty Phone number   Zoila Shutter, MD  Internal Medicine [phone]        PATIENT INSTRUCTIONS: Patient Instructions  Patient instructed to take medications as defined in the Anti-coagulation Track section of this encounter.  Patient instructed to take today's dose.  Patient verbalized understanding of these  instructions.        FOLLOW-UP Return in 3 weeks (on 08/29/2012) for Follow up INR at 1030h.  Hulen Luster, III Pharm.D., CACP

## 2012-08-08 NOTE — Patient Instructions (Signed)
Patient instructed to take medications as defined in the Anti-coagulation Track section of this encounter.  Patient instructed to take today's dose.  Patient verbalized understanding of these instructions.    

## 2012-08-09 NOTE — Progress Notes (Signed)
Agree 

## 2012-08-20 ENCOUNTER — Other Ambulatory Visit: Payer: Self-pay

## 2012-08-23 ENCOUNTER — Other Ambulatory Visit: Payer: Self-pay | Admitting: *Deleted

## 2012-08-23 DIAGNOSIS — E039 Hypothyroidism, unspecified: Secondary | ICD-10-CM

## 2012-08-23 MED ORDER — LEVOTHYROXINE SODIUM 100 MCG PO TABS: 100.0000 ug | ORAL_TABLET | Freq: Every day | ORAL | Status: DC

## 2012-08-29 ENCOUNTER — Encounter: Payer: Self-pay | Admitting: Internal Medicine

## 2012-08-29 ENCOUNTER — Ambulatory Visit (INDEPENDENT_AMBULATORY_CARE_PROVIDER_SITE_OTHER): Payer: Medicare Other | Admitting: Internal Medicine

## 2012-08-29 ENCOUNTER — Ambulatory Visit (INDEPENDENT_AMBULATORY_CARE_PROVIDER_SITE_OTHER): Payer: Medicare Other | Admitting: Pharmacist

## 2012-08-29 VITALS — BP 152/76 | HR 64 | Temp 97.2°F | Ht 65.0 in | Wt 198.6 lb

## 2012-08-29 DIAGNOSIS — F329 Major depressive disorder, single episode, unspecified: Secondary | ICD-10-CM

## 2012-08-29 DIAGNOSIS — D682 Hereditary deficiency of other clotting factors: Secondary | ICD-10-CM

## 2012-08-29 DIAGNOSIS — R197 Diarrhea, unspecified: Secondary | ICD-10-CM

## 2012-08-29 DIAGNOSIS — Z7901 Long term (current) use of anticoagulants: Secondary | ICD-10-CM

## 2012-08-29 DIAGNOSIS — R030 Elevated blood-pressure reading, without diagnosis of hypertension: Secondary | ICD-10-CM

## 2012-08-29 DIAGNOSIS — F3289 Other specified depressive episodes: Secondary | ICD-10-CM

## 2012-08-29 LAB — BASIC METABOLIC PANEL
BUN: 15 mg/dL (ref 6–23)
CO2: 28 mEq/L (ref 19–32)
Calcium: 9.4 mg/dL (ref 8.4–10.5)
Chloride: 104 mEq/L (ref 96–112)
Creat: 0.8 mg/dL (ref 0.50–1.10)
Glucose, Bld: 82 mg/dL (ref 70–99)

## 2012-08-29 NOTE — Patient Instructions (Signed)
Check your blood pressure every day for the next 2 weeks

## 2012-08-29 NOTE — Assessment & Plan Note (Signed)
Resolved with colestipol. Patient herself frequently his dosage currently she is taking 1 mg every other day.

## 2012-08-29 NOTE — Assessment & Plan Note (Signed)
Reviewing patient's blood pressure it has been gradually elevated since her last 3 months. I will obtain basic metabolic panel today and start antihypertensive therapy. I will reevaluate patient in 2 weeks. I discussed with the patient diet and exercise changes including walking at least 15-30 minutes 3 times a day and possible water aerobic. Patient is definitely interested.

## 2012-08-29 NOTE — Assessment & Plan Note (Signed)
Patient feels great. Denies any depressed mood or anxiety.

## 2012-08-29 NOTE — Progress Notes (Signed)
Subjective:   Patient ID: Pamela [name] female   DOB: 09-Feb-1946 67 y.o.   MRN: [id number]  HPI: Pamela [name] is a 67 y.o. female with past medical history significant as outlined below who presented to the clinic For regular office visit. Patient was last evaluated him 02/2012. Patient reports she has been doing fine except some upper respiratory symptoms and and an ankle sprain since last office visit. She was evaluated by gastroenterology for chronic diarrhea and was started on colestipol and since then her symptoms have significantly improved.    Past Medical History  Diagnosis Date  . HYPERLIPIDEMIA 06/03/2006  . OBESITY, MILD 04/24/2008  . GLAUCOMA 04/24/2008  . CHOLELITHIASIS, WITH OBSTRUCTION 04/21/2006    s/p ERCP,sprincterotomy, stent ([facility])  . 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 Hypothyroidism lifelong  . Tenosynovitis 01/2004    [facility]   Current Outpatient Prescriptions  Medication Sig Dispense Refill  . colestipol (COLESTID) 1 G tablet Take 1 g by mouth 2 (two) times daily.      [name] levothyroxine (LEVOTHROID) 100 MCG tablet Take 1 tablet (100 mcg total) by mouth daily.  90 tablet  0  . Multiple Vitamins-Calcium (DAILY COMBO MULTIVITS/CALCIUM) TABS Take 1 tablet by mouth daily.        . pantoprazole (PROTONIX) 40 MG tablet Take 1 tablet (40 mg total) by mouth daily.  90 tablet  0  . warfarin (COUMADIN) 4 MG tablet USE AS DIRECTED BY ANTICOAGULATION      CLINIC PROVIDER  30 tablet  0   No current facility-administered medications for this visit.   Family History  Problem Relation Age of Onset  . Cancer Mother     H&N, smoker  . Cancer Sister     lung, 2011   History   Social History  . Marital Status: Married    Spouse Name: N/A    Number of Children: N/A  . Years of Education: N/A   Occupational History  . 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 Smoking status: Former Smoker --  1.00 packs/day for 20 years    Quit date: 07/06/1985  . Smokeless tobacco: None     Comment: Quit 1988  . Alcohol Use: Yes     Comment: 2-3 glasses of wine daily.  . Drug Use: No  . Sexually Active: None   Other Topics Concern  . None   Social History Narrative   Married, no regular exercise.   Review of Systems: Constitutional: Denies fever, chills, diaphoresis, appetite change and fatigue.   Respiratory: Denies SOB, DOE, cough, chest tightness,  and wheezing.   Cardiovascular: Denies chest pain, palpitations and leg swelling.  Gastrointestinal: Denies nausea, vomiting, abdominal pain, diarrhea, constipation, blood in stool and abdominal distention.  Genitourinary: Denies dysuria, urgency, frequency, hematuria, flank pain and difficulty urinating.  Neurological: Denies dizziness, weakness, light-headedness, numbness and headaches.  Psych: Denies any depressed mood or anxiety.   Objective:  Physical Exam: Filed Vitals:   08/29/12 1320 08/29/12 1349  BP: 166/86 152/76  Pulse: 75 64  Temp: 97.2 F (36.2 C)   TempSrc: Oral   Height: 5\' 5"  (1.651 m)   Weight: 198 lb 9.6 oz (90.084 kg)   SpO2: 97%    Constitutional: Vital signs reviewed.  Patient is a well-developed and well-nourished  in no acute distress and cooperative with exam. Alert and oriented x3.  Mouth: no  erythema or exudates, MMM Eyes: PERRL, EOMI, conjunctivae normal, No scleral icterus.  Neck: Supple Cardiovascular: RRR, S1 normal, S2 normal, no MRG, pulses symmetric and intact bilaterally Pulmonary/Chest: CTAB, no wheezes, rales, or rhonchi Abdominal: Soft. Non-tender, non-distended, bowel sounds are normal Extr: 1 + pitting edema  Neurological: A&O x3, Strength is normal and symmetric bilaterally,no focal motor deficit, sensory intact to light touch bilaterally.

## 2012-08-29 NOTE — Patient Instructions (Signed)
Patient instructed to take medications as defined in the Anti-coagulation Track section of this encounter.  Patient instructed to take today's dose.  Patient verbalized understanding of these instructions.    

## 2012-08-29 NOTE — Progress Notes (Signed)
Anti-Coagulation Progress Note  Pamela [name] is a 67 y.o. female who is currently on an anti-coagulation regimen.    RECENT RESULTS: Recent results are below, the most recent result is correlated with a dose of 16 mg. per week: Lab Results  Component Value Date   INR 1.70 08/29/2012   INR 4.10 08/08/2012   INR 2.50 07/11/2012    ANTI-COAG DOSE: Anticoagulation Dose Instructions as of 08/29/2012     [name] Tue Wed Thu Fri Sat   New Dose 4 mg 2 mg 4 mg 2 mg 4 mg 2 mg 2 mg       ANTICOAG SUMMARY: Anticoagulation Episode Summary   Current INR goal 2.0-3.0  Next INR check 09/19/2012  INR from last check 1.70! (08/29/2012)  Weekly max dose   Target end date Indefinite  [facility]  Preferred lab   Send INR reminders to ANTICOAG [facility]   Indications  FACTOR II DEFICIENCY [286.3] DVT HX OF (Resolved) [V12.51] Long term current use of anticoagulant [V58.61]        Comments       Anticoagulation Care Providers   Provider Role Specialty Phone number   Zoila Shutter, MD  Internal Medicine [phone]      ANTICOAG TODAY: Anticoagulation Summary as of 08/29/2012   INR goal 2.0-3.0  Selected INR 1.70! (08/29/2012)  Next INR check 09/19/2012  Target end date Indefinite   Indications  FACTOR II DEFICIENCY [286.3] DVT HX OF (Resolved) [V12.51] Long term current use of anticoagulant [V58.61]      Anticoagulation Episode Summary   [facility] check location [facility]   Preferred lab    Send INR reminders to ANTICOAG IMP   Comments     Anticoagulation Care Providers   Provider Role Specialty Phone number   Zoila Shutter, MD  Internal Medicine [phone]      PATIENT INSTRUCTIONS: Patient Instructions  Patient instructed to take medications as defined in the Anti-coagulation Track section of this encounter.  Patient instructed to take today's dose.  Patient verbalized understanding of these instructions.       FOLLOW-UP Return in 3  weeks (on 09/19/2012) for Follow up INR at 0945h.  Hulen Luster, III Pharm.D., CACP

## 2012-08-31 ENCOUNTER — Other Ambulatory Visit: Payer: Self-pay | Admitting: Internal Medicine

## 2012-08-31 DIAGNOSIS — I10 Essential (primary) hypertension: Secondary | ICD-10-CM

## 2012-08-31 MED ORDER — HYDROCHLOROTHIAZIDE 12.5 MG PO TABS: 12.5000 mg | ORAL_TABLET | Freq: Every day | ORAL | Status: DC

## 2012-09-12 ENCOUNTER — Ambulatory Visit (INDEPENDENT_AMBULATORY_CARE_PROVIDER_SITE_OTHER): Payer: Medicare Other | Admitting: Internal Medicine

## 2012-09-12 ENCOUNTER — Encounter: Payer: Self-pay | Admitting: Internal Medicine

## 2012-09-12 VITALS — BP 137/85 | HR 95 | Temp 98.5°F | Ht 65.0 in | Wt 197.5 lb

## 2012-09-12 DIAGNOSIS — I10 Essential (primary) hypertension: Secondary | ICD-10-CM

## 2012-09-12 LAB — BASIC METABOLIC PANEL
BUN: 14 mg/dL (ref 6–23)
Creat: 0.75 mg/dL (ref 0.50–1.10)
Glucose, Bld: 82 mg/dL (ref 70–99)
Potassium: 4.5 mEq/L (ref 3.5–5.3)

## 2012-09-12 NOTE — Assessment & Plan Note (Signed)
Blood pressure day improved after starting on hydrochlorothiazide 12.5 mg 2 weeks ago. The patient did not bring her blood pressure reading with her but noted her blood pressure has been between  Systolic 120 and 409. For now I will continue hydrochlorothiazide 12.5 mg daily and reevaluate patient in 4-6 weeks. Recommended to check her blood pressure on a regular basis and if blood pressure continues to be elevated about 140/90 patient was instructed to call the clinic for further advice. I will obtain bmet today BP Readings from Last 3 Encounters:  09/12/12 137/85  08/29/12 152/76  05/17/12 173/84

## 2012-09-12 NOTE — Progress Notes (Signed)
Subjective:   Patient ID: Pamela [name] female   DOB: [birth year] 67 y.o.   MRN: [id number]  HPI: Pamela [name] is a 67 y.o. female with past medical history significant as outlined below who presented to the clinic for a followup for blood pressure recheck. Patient reports that she has been doing fine and tolerating her medication well. She had been checking her blood pressure on a regular basis at home but did not bring her logbook with her. She reports that has been to 120 and 140.    Past Medical History  Diagnosis Date  . HYPERLIPIDEMIA 06/03/2006  . OBESITY, MILD 04/24/2008  . GLAUCOMA 04/24/2008  . CHOLELITHIASIS, WITH OBSTRUCTION 04/21/2006    s/p ERCP,sprincterotomy, stent ([facility])  . 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 Hypothyroidism lifelong  . Tenosynovitis 01/2004    [facility]   Current Outpatient Prescriptions  Medication Sig Dispense Refill  . colestipol (COLESTID) 1 G tablet Take 1 g by mouth 2 (two) times daily.      . hydrochlorothiazide (HYDRODIURIL) 12.5 MG tablet Take 1 tablet (12.5 mg total) by mouth daily.  30 tablet  2  . levothyroxine (LEVOTHROID) 100 MCG tablet Take 1 tablet (100 mcg total) by mouth daily.  90 tablet  0  . Multiple Vitamins-Calcium (DAILY COMBO MULTIVITS/CALCIUM) TABS Take 1 tablet by mouth daily.        . pantoprazole (PROTONIX) 40 MG tablet Take 1 tablet (40 mg total) by mouth daily.  90 tablet  0  . warfarin (COUMADIN) 4 MG tablet USE AS DIRECTED BY ANTICOAGULATION      CLINIC PROVIDER  30 tablet  0   No current facility-administered medications for this visit.   Family History  Problem Relation Age of Onset  . Cancer Mother     H&N, smoker  . Cancer Sister     lung, 2011   History   Social History  . Marital Status: Married    Spouse Name: N/A    Number of Children: N/A  . Years of Education: N/A   Occupational History  . [employer] CE COORDINATOR     Retired in 2011   Social History  Main Topics  . Smoking status: Former Smoker -- 1.00 packs/day for 20 years    Quit date: 07/06/1985  . Smokeless tobacco: None     Comment: Quit 1988  . Alcohol Use: Yes     Comment: 2-3 glasses of wine daily.  . Drug Use: No  . Sexually Active: None   Other Topics Concern  . None   Social History Narrative   Married, no regular exercise.   Review of Systems: Constitutional: Denies fever, chills, diaphoresis, appetite change and fatigue.  Respiratory: Denies SOB, DOE, cough, chest tightness,  and wheezing.   Cardiovascular: Denies chest pain, palpitations and leg swelling.  Gastrointestinal: Denies nausea, vomiting, abdominal pain, diarrhea, constipation,  Neurological: Denies dizziness, weakness, light-headedness, numbness and headaches.    Objective:  Physical Exam: Filed Vitals:   09/12/12 1422  BP: 137/85  Pulse: 95  Temp: 98.5 F (36.9 C)  TempSrc: Oral  Height: 5\' 5"  (1.651 m)  Weight: 197 lb 8 oz (89.585 kg)  SpO2: 96%   Constitutional: Vital signs reviewed.  Patient is a well-developed and well-nourished female in no acute distress and cooperative with exam. Alert and oriented x3.  Neck: Supple Cardiovascular: RRR, S1 normal, S2 normal, no MRG, pulses symmetric and intact bilaterally Pulmonary/Chest: CTAB, no  wheezes, rales, or rhonchi Abdominal: Soft. Non-tender, non-distended, bowel sounds are normal,  Neurological: A&O x3

## 2012-09-19 ENCOUNTER — Ambulatory Visit (INDEPENDENT_AMBULATORY_CARE_PROVIDER_SITE_OTHER): Payer: Medicare Other | Admitting: Pharmacist

## 2012-09-19 DIAGNOSIS — D682 Hereditary deficiency of other clotting factors: Secondary | ICD-10-CM

## 2012-09-19 DIAGNOSIS — Z7901 Long term (current) use of anticoagulants: Secondary | ICD-10-CM

## 2012-09-19 LAB — POCT INR: INR: 3.4

## 2012-09-19 NOTE — Patient Instructions (Signed)
Patient instructed to take medications as defined in the Anti-coagulation Track section of this encounter.  Patient instructed to take today's dose.  Patient verbalized understanding of these instructions.    

## 2012-09-19 NOTE — Progress Notes (Signed)
Anti-Coagulation Progress Note  Pamela [name] is a 67 y.o. female who is currently on an anti-coagulation regimen.    RECENT RESULTS: Recent results are below, the most recent result is correlated with a dose of 20 mg. per week: Lab Results  Component Value Date   INR 3.40 09/19/2012   INR 1.70 08/29/2012   INR 4.10 08/08/2012    ANTI-COAG DOSE: Anticoagulation Dose Instructions as of 09/19/2012     [name] Tue Wed Thu Fri Sat   New Dose 4 mg 2 mg 2 mg 2 mg 4 mg 2 mg 2 mg       ANTICOAG SUMMARY: Anticoagulation Episode Summary   Current INR goal 2.0-3.0  Next INR check 10/17/2012  INR from last check 3.40! (09/19/2012)  Weekly max dose   Target end date Indefinite  [facility]  Preferred lab   Send INR reminders to ANTICOAG [facility]   Indications  FACTOR II DEFICIENCY [286.3] DVT HX OF (Resolved) [V12.51] Long term current use of anticoagulant [V58.61]        Comments       Anticoagulation Care Providers   Provider Role Specialty Phone number   Zoila Shutter, MD  Internal Medicine [phone]      ANTICOAG TODAY: Anticoagulation Summary as of 09/19/2012   INR goal 2.0-3.0  Selected INR 3.40! (09/19/2012)  Next INR check 10/17/2012  Target end date Indefinite   Indications  FACTOR II DEFICIENCY [286.3] DVT HX OF (Resolved) [V12.51] Long term current use of anticoagulant [V58.61]      Anticoagulation Episode Summary   [facility] check [facility]   Preferred lab    Send INR reminders to ANTICOAG [facility]   Comments     Anticoagulation Care Providers   Provider Role Specialty Phone number   Zoila Shutter, MD  Internal Medicine [phone]      PATIENT INSTRUCTIONS: Patient Instructions  Patient instructed to take medications as defined in the Anti-coagulation Track section of this encounter.  Patient instructed to take today's dose.  Patient verbalized understanding of these instructions.       FOLLOW-UP Return in 4  weeks (on 10/17/2012) for Follow up INR at 1000h.  Hulen Luster, III Pharm.D., CACP

## 2012-10-04 ENCOUNTER — Other Ambulatory Visit: Payer: Self-pay | Admitting: *Deleted

## 2012-10-04 DIAGNOSIS — K219 Gastro-esophageal reflux disease without esophagitis: Secondary | ICD-10-CM

## 2012-10-05 MED ORDER — PANTOPRAZOLE SODIUM 40 MG PO TBEC: 40.0000 mg | DELAYED_RELEASE_TABLET | Freq: Every day | ORAL | Status: DC

## 2012-10-07 ENCOUNTER — Other Ambulatory Visit: Payer: Self-pay | Admitting: *Deleted

## 2012-10-10 MED ORDER — WARFARIN SODIUM 4 MG PO TABS: ORAL_TABLET | ORAL | Status: DC

## 2012-10-17 ENCOUNTER — Ambulatory Visit (INDEPENDENT_AMBULATORY_CARE_PROVIDER_SITE_OTHER): Payer: Medicare Other | Admitting: Pharmacist

## 2012-10-17 DIAGNOSIS — Z7901 Long term (current) use of anticoagulants: Secondary | ICD-10-CM

## 2012-10-17 DIAGNOSIS — D682 Hereditary deficiency of other clotting factors: Secondary | ICD-10-CM

## 2012-10-17 LAB — POCT INR: INR: 3.2

## 2012-10-17 NOTE — Progress Notes (Signed)
Anti-Coagulation Progress Note  Pamela [name] is a 67 y.o. female who is currently on an anti-coagulation regimen.    RECENT RESULTS: Recent results are below, the most recent result is correlated with a dose of 18 mg. per week: Lab Results  Component Value Date   INR 3.20 10/17/2012   INR 3.40 09/19/2012   INR 1.70 08/29/2012    ANTI-COAG DOSE: Anticoagulation Dose Instructions as of 10/17/2012     [name] Tue Wed Thu Fri Sat   New Dose 4 mg 2 mg 2 mg 2 mg 4 mg 2 mg 2 mg       ANTICOAG SUMMARY: Anticoagulation Episode Summary   Current INR goal 2.0-3.0  Next INR check 11/21/2012  INR from last check 3.20! (10/17/2012)  Weekly max dose   Target end date Indefinite  [facility]  Preferred lab   Send INR reminders to ANT[facility]AG [facility]   Indications  FACTOR II DEFICIENCY [286.3] DVT HX OF (Resolved) [V12.51] Long term current use of anticoagulant [V58.61]        Comments       Anticoagulation Care Providers   Provider Role Specialty Phone number   Zoila Shutter, MD  Internal Medicine [phone]      ANTICOAG TODAY: Anticoagulation Summary as of 10/17/2012   INR goal 2.0-3.0  Selected INR 3.20! (10/17/2012)  Next INR check 11/21/2012  Target end date Indefinite   Indications  FACTOR II DEFICIENCY [286.3] DVT HX OF (Resolved) [V12.51] Long term current use of anticoagulant [V58.61]      Anticoagulation Episode Summary   [facility] check [facility]   Preferred lab    Send INR reminders to ANTICOAG [facility]   Comments     Anticoagulation Care Providers   Provider Role Specialty Phone number   Zoila Shutter, MD  Internal Medicine [phone]      PATIENT INSTRUCTIONS: Patient Instructions  Patient instructed to take medications as defined in the Anti-coagulation Track section of this encounter.  Patient instructed to take today's dose.  Patient verbalized understanding of these instructions.       FOLLOW-UP Return in 4  weeks (on 11/14/2012) for Follow up INR at 1000h.  Hulen Luster, III Pharm.D., CACP

## 2012-10-17 NOTE — Patient Instructions (Signed)
Patient instructed to take medications as defined in the Anti-coagulation Track section of this encounter.  Patient instructed to take today's dose.  Patient verbalized understanding of these instructions.    

## 2012-10-17 NOTE — Progress Notes (Signed)
Indication: Factor II deficiency.  Duration: Lifelong.  INR above target.  I agree with Dr. Saralyn Pilar assessment and plan as documented.

## 2012-11-07 ENCOUNTER — Encounter: Payer: Self-pay | Admitting: Internal Medicine

## 2012-11-07 ENCOUNTER — Ambulatory Visit (INDEPENDENT_AMBULATORY_CARE_PROVIDER_SITE_OTHER): Payer: Medicare Other | Admitting: Internal Medicine

## 2012-11-07 VITALS — BP 139/89 | HR 85 | Temp 98.6°F | Ht 64.5 in | Wt 196.3 lb

## 2012-11-07 DIAGNOSIS — I10 Essential (primary) hypertension: Secondary | ICD-10-CM

## 2012-11-07 DIAGNOSIS — H409 Unspecified glaucoma: Secondary | ICD-10-CM

## 2012-11-07 DIAGNOSIS — M25569 Pain in unspecified knee: Secondary | ICD-10-CM | POA: Insufficient documentation

## 2012-11-07 DIAGNOSIS — E039 Hypothyroidism, unspecified: Secondary | ICD-10-CM

## 2012-11-07 NOTE — Progress Notes (Signed)
I have discussed this case with Dr. Illath , read the documentation and I agree with the plan of care. Please see the resident note for details of management.  

## 2012-11-07 NOTE — Progress Notes (Signed)
Subjective:   Patient ID: Pamela [name] female   DOB: 04/08/1946 67 y.o.   MRN: [id number]  HPI: Pamela [name] is a 66 y.o. female with past medical history significant as outlined below who presented to the clinic for regular  followup. Patient reports she has been doing fine. She documented her blood pressure on regular basis. Average blood pressures are between 125 -140/70-90.   Patient report she has occasionally some knee pain especially when she stands for very long time. She seems different nursing homes which means she has to stand for 20-30 minutes. Denies any swelling in her joints, erythema, popping. The knees never gave out on her. She is not yet ready to see an orthopedic.    Past Medical History  Diagnosis Date  . HYPERLIPIDEMIA 06/03/2006  . OBESITY, MILD 04/24/2008  . GLAUCOMA 04/24/2008  . CHOLELITHIASIS, WITH OBSTRUCTION 04/21/2006    s/p ERCP,sprincterotomy, stent ([facility])  . 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 Hypothyroidism lifelong  . Tenosynovitis 01/2004    [facility]   Current Outpatient Prescriptions  Medication Sig Dispense Refill  . colestipol (COLESTID) 1 G tablet Take 1 g by mouth 2 (two) times daily.      . hydrochlorothiazide (HYDRODIURIL) 12.5 MG tablet Take 1 tablet (12.5 mg total) by mouth daily.  30 tablet  2  . levothyroxine (LEVOTHROID) 100 MCG tablet Take 1 tablet (100 mcg total) by mouth daily.  90 tablet  0  . Multiple Vitamins-Calcium (DAILY COMBO MULTIVITS/CALCIUM) TABS Take 1 tablet by mouth daily.        . pantoprazole (PROTONIX) 40 MG tablet Take 1 tablet (40 mg total) by mouth daily.  90 tablet  0  . warfarin (COUMADIN) 4 MG tablet USE AS DIRECTED BY ANTICOAGULATION      CLINIC PROVIDER  30 tablet  0   No current facility-administered medications for this visit.   Family History  Problem Relation Age of Onset  . Cancer Mother     H&N, smoker  . Cancer Sister     lung, 2011   History    Social History  . Marital Status: Married    Spouse Name: N/A    Number of Children: N/A  . Years of Education: N/A   Occupational History  . 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 Smoking status: Former Smoker -- 1.00 packs/day for 20 years    Quit date: 07/06/1985  . Smokeless tobacco: None     Comment: Quit 1988  . Alcohol Use: Yes     Comment: 2-3 glasses of wine daily.  . Drug Use: No  . Sexually Active: None   Other Topics Concern  . None   Social History Narrative   Married, no regular exercise.   Review of Systems: Constitutional: Denies fever, chills, diaphoresis, appetite change and fatigue.  Respiratory: Denies SOB, DOE, cough, chest tightness,  and wheezing.   Cardiovascular: Denies chest pain, palpitations and leg swelling.  Gastrointestinal: Denies nausea, vomiting, abdominal pain, diarrhea, constipation, blood in stool and abdominal distention.  Genitourinary: Denies dysuria, urgency, frequency, hematuria, flank pain and difficulty urinating.  Musculoskeletal: Denies myalgias, back pain, joint swelling, arthralgias and gait problem.  Skin: Denies pallor, rash and wound.  Neurological: Denies dizziness,  weakness, light-headedness, numbness and headaches.    Objective:  Physical Exam: Filed Vitals:   11/07/12 1432  BP: 139/89  Pulse: 95  Temp: 98.6 F (37 C)  TempSrc: Oral  Height: 5' 4.5" (1.638 m)  Weight: 196 lb 4.8 oz (89.041 kg)  SpO2: 99%   Constitutional: Vital signs reviewed.  Patient is a well-developed and well-nourished female in no acute distress and cooperative with exam. Alert and oriented x3.  Eyes: PERRL, EOMI, conjunctivae normal, No scleral icterus.  Neck: Supple,   Cardiovascular: RRR, S1 normal, S2 normal, no MRG, pulses symmetric and intact bilaterally Pulmonary/Chest: CTAB, no wheezes, rales, or rhonchi Abdominal: Soft. Non-tender, non-distended, bowel sounds are normal, no masses,  organomegaly, or guarding present.  Musculoskeletal: No joint deformities, erythema, or stiffness, ROM full and no nontender Hematology: no cervical adenopathy.  Neurological: A&O x3, Strength is normal and symmetric bilaterally, no focal motor deficit, sensory intact to light touch bilaterally.  Skin: Warm, dry and intact. No rash, cyanosis, or clubbing.

## 2012-11-07 NOTE — Assessment & Plan Note (Signed)
Patient's blood pressure is well-controlled during this office visit. Repeat heart rate was 85. I will continue hydrochlorothiazide 12.5 mg daily at this point. Reschedule patient in 4 month period discussed again about the importance of exercise.

## 2012-11-07 NOTE — Assessment & Plan Note (Signed)
Likely due to degenerative disease. Advised patient if needed she can take acetaminophen. Again recommended about the  importance of exercise. Patient does not want to proceed with any further intervention at this point.

## 2012-11-17 ENCOUNTER — Encounter: Payer: Self-pay | Admitting: Internal Medicine

## 2012-11-18 ENCOUNTER — Other Ambulatory Visit: Payer: Self-pay | Admitting: *Deleted

## 2012-11-18 DIAGNOSIS — E039 Hypothyroidism, unspecified: Secondary | ICD-10-CM

## 2012-11-18 MED ORDER — LEVOTHYROXINE SODIUM 100 MCG PO TABS: 100.0000 ug | ORAL_TABLET | Freq: Every day | ORAL | Status: DC

## 2012-11-21 ENCOUNTER — Ambulatory Visit (INDEPENDENT_AMBULATORY_CARE_PROVIDER_SITE_OTHER): Payer: Medicare Other | Admitting: Pharmacist

## 2012-11-21 DIAGNOSIS — Z7901 Long term (current) use of anticoagulants: Secondary | ICD-10-CM

## 2012-11-21 DIAGNOSIS — D682 Hereditary deficiency of other clotting factors: Secondary | ICD-10-CM

## 2012-11-21 NOTE — Progress Notes (Signed)
Anti-Coagulation Progress Note  Pamela [name] is a 67 y.o. female who is currently on an anti-coagulation regimen.    RECENT RESULTS: Recent results are below, the most recent result is correlated with a dose of 18 mg. per week: Lab Results  Component Value Date   INR 2.20 11/21/2012   INR 3.20 10/17/2012   INR 3.40 09/19/2012    ANTI-COAG DOSE: Anticoagulation Dose Instructions as of 11/21/2012     [name] Tue Wed Thu Fri Sat   New Dose 4 mg 2 mg 2 mg 2 mg 4 mg 2 mg 2 mg       ANTICOAG SUMMARY: Anticoagulation Episode Summary   Current INR goal 2.0-3.0  Next INR check 12/19/2012  INR from last check 2.20 (11/21/2012)  Weekly max dose   Target end date Indefinite  [facility]  Preferred lab   Send INR reminders to ANTICOAG [facility]   Indications  FACTOR II DEFICIENCY [286.3] DVT HX OF (Resolved) [V12.51] Long term current use of anticoagulant [V58.61]        Comments       Anticoagulation Care Providers   Provider Role Specialty Phone number   Zoila Shutter, MD  Internal Medicine [phone]      ANTICOAG TODAY: Anticoagulation Summary as of 11/21/2012   INR goal 2.0-3.0  Selected INR 2.20 (11/21/2012)  Next INR check 12/19/2012  Target end date Indefinite   Indications  FACTOR II DEFICIENCY [286.3] DVT HX OF (Resolved) [V12.51] Long term current use of anticoagulant [V58.61]      Anticoagulation Episode Summary   [facility] check location [facility]   Preferred lab    Send INR reminders to ANTICOAG [facility]   Comments     Anticoagulation Care Providers   Provider Role Specialty Phone number   Zoila Shutter, MD  Internal Medicine [phone]      PATIENT INSTRUCTIONS: Patient Instructions  Patient instructed to take medications as defined in the Anti-coagulation Track section of this encounter.  Patient instructed to take today's dose.  Patient verbalized understanding of these instructions.       FOLLOW-UP Return in 4  weeks (on 12/19/2012) for Follow up INR at 1015h.  Hulen Luster, III Pharm.D., CACP

## 2012-11-21 NOTE — Patient Instructions (Signed)
Patient instructed to take medications as defined in the Anti-coagulation Track section of this encounter.  Patient instructed to take today's dose.  Patient verbalized understanding of these instructions.    

## 2012-11-30 ENCOUNTER — Other Ambulatory Visit: Payer: Self-pay | Admitting: Internal Medicine

## 2012-11-30 DIAGNOSIS — I10 Essential (primary) hypertension: Secondary | ICD-10-CM

## 2012-12-19 ENCOUNTER — Ambulatory Visit (INDEPENDENT_AMBULATORY_CARE_PROVIDER_SITE_OTHER): Payer: Medicare Other | Admitting: Pharmacist

## 2012-12-19 DIAGNOSIS — Z7901 Long term (current) use of anticoagulants: Secondary | ICD-10-CM

## 2012-12-19 DIAGNOSIS — D682 Hereditary deficiency of other clotting factors: Secondary | ICD-10-CM

## 2012-12-19 NOTE — Progress Notes (Signed)
Anti-Coagulation Progress Note  Pamela [name] is a 67 y.o. female who is currently on an anti-coagulation regimen.    RECENT RESULTS: Recent results are below, the most recent result is correlated with a dose of 16 mg. per week: Lab Results  Component Value Date   INR 2.30 12/19/2012   INR 2.20 11/21/2012   INR 3.20 10/17/2012    ANTI-COAG DOSE: Anticoagulation Dose Instructions as of 12/19/2012     [name] Tue Wed Thu Fri Sat   New Dose 4 mg 2 mg 2 mg 2 mg 8 mg 2 mg 2 mg       ANTICOAG SUMMARY: Anticoagulation Episode Summary   Current INR goal 2.0-3.0  Next INR check 01/16/2013  INR from last check 2.30 (12/19/2012)  Weekly max dose   Target end date Indefinite  [facility]  Preferred lab   Send INR reminders to ANTICOAG [facility]   Indications  FACTOR II DEFICIENCY [286.3] DVT HX OF (Resolved) [V12.51] Long term current use of anticoagulant [V58.61]        Comments       Anticoagulation Care Providers   Provider Role Specialty Phone number   Zoila Shutter, MD  Internal Medicine [phone]      ANTICOAG TODAY: Anticoagulation Summary as of 12/19/2012   INR goal 2.0-3.0  Selected INR 2.30 (12/19/2012)  Next INR check 01/16/2013  Target end date Indefinite   Indications  FACTOR II DEFICIENCY [286.3] DVT HX OF (Resolved) [V12.51] Long term current use of anticoagulant [V58.61]      Anticoagulation Episode Summary   [facility] check [facility]   Preferred lab    Send INR reminders to ANTICOAG [facility]   Comments     Anticoagulation Care Providers   Provider Role Specialty Phone number   Zoila Shutter, MD  Internal Medicine [phone]      PATIENT INSTRUCTIONS: Patient Instructions  Patient instructed to take medications as defined in the Anti-coagulation Track section of this encounter.  Patient instructed to take today's dose.  Patient verbalized understanding of these instructions.       FOLLOW-UP Return in 4  weeks (on 01/16/2013) for Follow up INR at 1015h.  Hulen Luster, III Pharm.D., CACP

## 2012-12-19 NOTE — Patient Instructions (Signed)
Patient instructed to take medications as defined in the Anti-coagulation Track section of this encounter.  Patient instructed to take today's dose.  Patient verbalized understanding of these instructions.    

## 2013-01-04 ENCOUNTER — Other Ambulatory Visit: Payer: Self-pay | Admitting: Internal Medicine

## 2013-01-05 NOTE — Telephone Encounter (Signed)
Pls sch Sep, OCt, or Nov appt with me / PCP

## 2013-01-16 ENCOUNTER — Other Ambulatory Visit: Payer: Self-pay | Admitting: Internal Medicine

## 2013-01-16 ENCOUNTER — Ambulatory Visit (INDEPENDENT_AMBULATORY_CARE_PROVIDER_SITE_OTHER): Payer: Medicare Other | Admitting: Pharmacist

## 2013-01-16 DIAGNOSIS — D682 Hereditary deficiency of other clotting factors: Secondary | ICD-10-CM

## 2013-01-16 DIAGNOSIS — Z7901 Long term (current) use of anticoagulants: Secondary | ICD-10-CM

## 2013-01-16 LAB — POCT INR: INR: 1.9

## 2013-01-16 NOTE — Progress Notes (Signed)
Anti-Coagulation Progress Note  Pamela [name] is a 67 y.o. female who is currently on an anti-coagulation regimen.    RECENT RESULTS: Recent results are below, the most recent result is correlated with a dose of 16 mg. per week: Lab Results  Component Value Date   INR 1.90 01/16/2013   INR 2.30 12/19/2012   INR 2.20 11/21/2012    ANTI-COAG DOSE:  Has been on warfarin 16mg /wk. She will now be on 18mg /wk as:  2mg  on Sundays; Tuesdays; Wednesdays; Fridays; and Saturdays. On Mondays and Thursdays she will take 4mg  for a total of 18mg  week.  ANTICOAG SUMMARY: Anticoagulation Episode Summary   Current INR goal 2.0-3.0  Next INR check 02/06/2013  INR from last check 1.90! (01/16/2013)  Weekly max dose   Target end date Indefinite  [facility]  Preferred lab   Send INR reminders to ANTICOAG [facility]   Indications  FACTOR II DEFICIENCY [286.3] DVT HX OF (Resolved) [V12.51] Long term current use of anticoagulant [V58.61]        Comments       Anticoagulation Care Providers   Provider Role Specialty Phone number   Zoila Shutter, MD  Internal Medicine [phone]      ANTICOAG TODAY: Anticoagulation Summary as of 01/16/2013   INR goal 2.0-3.0  Selected INR 1.90! (01/16/2013)  Next INR check 02/06/2013  Target end date Indefinite   Indications  FACTOR II DEFICIENCY [286.3] DVT HX OF (Resolved) [V12.51] Long term current use of anticoagulant [V58.61]      Anticoagulation Episode Summary   [facility] check location [facility]   Preferred lab    Send INR reminders to ANTICOAG [facility]   Comments     Anticoagulation Care Providers   Provider Role Specialty Phone number   Zoila Shutter, MD  Internal Medicine [phone]      PATIENT INSTRUCTIONS: Patient Instructions  Patient instructed to take medications as defined in the Anti-coagulation Track section of this encounter.  Patient instructed to take today's dose.  Patient verbalized understanding of  these instructions.       FOLLOW-UP Return in 3 weeks (on 02/06/2013) for Follow up INR at 1015h.  Hulen Luster, III Pharm.D., CACP

## 2013-01-16 NOTE — Patient Instructions (Addendum)
Patient instructed to take medications as defined in the Anti-coagulation Track section of this encounter.  Patient instructed to take today's dose.  Patient verbalized understanding of these instructions.    

## 2013-02-06 ENCOUNTER — Ambulatory Visit (INDEPENDENT_AMBULATORY_CARE_PROVIDER_SITE_OTHER): Payer: Medicare Other | Admitting: Pharmacist

## 2013-02-06 DIAGNOSIS — Z7901 Long term (current) use of anticoagulants: Secondary | ICD-10-CM

## 2013-02-06 DIAGNOSIS — D682 Hereditary deficiency of other clotting factors: Secondary | ICD-10-CM

## 2013-02-06 NOTE — Patient Instructions (Signed)
Patient instructed to take medications as defined in the Anti-coagulation Track section of this encounter.  Patient instructed to tke today's dose.  Patient verbalized understanding of these instructions.    

## 2013-02-06 NOTE — Progress Notes (Signed)
Anti-Coagulation Progress Note  Pamela [name] is a 67 y.o. female who is currently on an anti-coagulation regimen.    RECENT RESULTS: Recent results are below, the most recent result is correlated with a dose of 18 mg. per week: Lab Results  Component Value Date   INR 2.60 02/06/2013   INR 1.90 01/16/2013   INR 2.30 12/19/2012    ANTI-COAG DOSE: Anticoagulation Dose Instructions as of 02/06/2013     [name] Tue Wed Thu Fri Sat   New Dose 2 mg 4 mg 2 mg 2 mg 4 mg 2 mg 2 mg       ANTICOAG SUMMARY: Anticoagulation Episode Summary   Current INR goal 2.0-3.0  Next INR check 03/13/2013  INR from last check 2.60 (02/06/2013)  Weekly max dose   Target end date Indefinite  [facility]  Preferred lab   Send INR reminders to ANTICOAG [facility]   Indications  FACTOR II DEFICIENCY [286.3] DVT HX OF (Resolved) [V12.51] Long term current use of anticoagulant [V58.61]        Comments       Anticoagulation Care Providers   Provider Role Specialty Phone number   Zoila Shutter, MD  Internal Medicine [phone]      ANTICOAG TODAY: Anticoagulation Summary as of 02/06/2013   INR goal 2.0-3.0  Selected INR 2.60 (02/06/2013)  Next INR check 03/13/2013  Target end date Indefinite   Indications  FACTOR II DEFICIENCY [286.3] DVT HX OF (Resolved) [V12.51] Long term current use of anticoagulant [V58.61]      Anticoagulation Episode Summary   [facility] check [facility]   Preferred lab    Send INR reminders to ANTICOAG [facility]   Comments     Anticoagulation Care Providers   Provider Role Specialty Phone number   Zoila Shutter, MD  Internal Medicine [phone]      PATIENT INSTRUCTIONS: Patient Instructions  Patient instructed to take medications as defined in the Anti-coagulation Track section of this encounter.  Patient instructed to tke today's dose.  Patient verbalized understanding of these instructions.       FOLLOW-UP Return in 5 weeks (on  03/13/2013) for Follow up INR at 1030h.  Hulen Luster, III Pharm.D., CACP

## 2013-02-14 ENCOUNTER — Encounter: Payer: Self-pay | Admitting: Internal Medicine

## 2013-02-14 ENCOUNTER — Ambulatory Visit (INDEPENDENT_AMBULATORY_CARE_PROVIDER_SITE_OTHER): Payer: Medicare Other | Admitting: Internal Medicine

## 2013-02-14 VITALS — BP 154/94 | HR 77 | Temp 97.9°F | Ht 65.0 in | Wt 195.9 lb

## 2013-02-14 DIAGNOSIS — R4589 Other symptoms and signs involving emotional state: Secondary | ICD-10-CM | POA: Insufficient documentation

## 2013-02-14 DIAGNOSIS — F411 Generalized anxiety disorder: Secondary | ICD-10-CM

## 2013-02-14 DIAGNOSIS — I8290 Acute embolism and thrombosis of unspecified vein: Secondary | ICD-10-CM

## 2013-02-14 DIAGNOSIS — I749 Embolism and thrombosis of unspecified artery: Secondary | ICD-10-CM

## 2013-02-14 DIAGNOSIS — K9089 Other intestinal malabsorption: Secondary | ICD-10-CM

## 2013-02-14 DIAGNOSIS — I10 Essential (primary) hypertension: Secondary | ICD-10-CM

## 2013-02-14 DIAGNOSIS — K219 Gastro-esophageal reflux disease without esophagitis: Secondary | ICD-10-CM

## 2013-02-14 DIAGNOSIS — E039 Hypothyroidism, unspecified: Secondary | ICD-10-CM

## 2013-02-14 DIAGNOSIS — M899 Disorder of bone, unspecified: Secondary | ICD-10-CM

## 2013-02-14 MED ORDER — LORAZEPAM 1 MG PO TABS: 1.0000 mg | ORAL_TABLET | Freq: Every day | ORAL | Status: DC | PRN

## 2013-02-14 NOTE — Assessment & Plan Note (Signed)
She takes both calcium and Vit D. She needs a repeat DEXA but now is not the best time. Will address next visit.

## 2013-02-14 NOTE — Assessment & Plan Note (Signed)
Her husband was just dx with metastatic renal cancer. She is OK during hte day when she can stay busy but at night she wakes up anxious and can't fall back asleep. Energy is OK. Her sister encouraged her to get Xanax. I discussed the disadvantages of Xanax and we decided on Ativan 1 mg, take 1/2 PRN. She can increase to a full pill if not controlled by the 1/2 pill.

## 2013-02-14 NOTE — Assessment & Plan Note (Signed)
Generally well controlled on HCTZ 12.5 QD. Increased today bc just found out husband dx with metastatic kidney cancer last week and she is dealing with the trauma and unknown of that. She checks it at home freq and I will cont current med and she will call me if it remains elevated.

## 2013-02-14 NOTE — Assessment & Plan Note (Signed)
Last TSH was 8/13. Pt is asymptomatic. I should have checked it today but didn't. Since she will be in the cancer center, I will send her an Rx for a TSH and see if she can get it drawn there.

## 2013-02-14 NOTE — Assessment & Plan Note (Signed)
Has had cholecystectomy. Then diarrhea started. Saw Dr Ewing Schlein and was dx and started on Colestid with good resolution of her sxs. Takes it 3 or 4 times a week.

## 2013-02-14 NOTE — Assessment & Plan Note (Signed)
We reviewed her VTE hx and I updated her overview.

## 2013-02-14 NOTE — Progress Notes (Signed)
  Subjective:    Patient ID: Pamela [name], female    DOB: 21-Feb-1946, 67 y.o.   MRN: [id number]  HPI  Please see the A&P for the status of the pt's chronic medical problems. This is my first visit with Pamela [name].   Review of Systems  Constitutional: Negative for activity change and appetite change.  Gastrointestinal: Negative for diarrhea and constipation.  Musculoskeletal: Positive for arthralgias.  Psychiatric/Behavioral: Positive for sleep disturbance. The patient is nervous/anxious.        Objective:   Physical Exam  Constitutional: She is oriented to person, place, and time. She appears well-developed and well-nourished. No distress.  HENT:  Head: Normocephalic and atraumatic.  Right Ear: External ear normal.  Left Ear: External ear normal.  Nose: Nose normal.  Eyes: Conjunctivae and EOM are normal. Right eye exhibits no discharge. Left eye exhibits no discharge. No scleral icterus.  Neck: Normal range of motion. Neck supple. No tracheal deviation present. No thyromegaly present.  Cardiovascular: Normal rate, regular rhythm and normal heart sounds.   Pulmonary/Chest: Effort normal and breath sounds normal.  Musculoskeletal: Normal range of motion. She exhibits edema. She exhibits no tenderness.  Lymphadenopathy:    She has no cervical adenopathy.  Neurological: She is alert and oriented to person, place, and time.  Skin: Skin is warm and dry. No rash noted. She is not diaphoretic. No erythema. No pallor.  Psychiatric: She has a normal mood and affect. Her behavior is normal. Judgment and thought content normal.          Assessment & Plan:

## 2013-02-14 NOTE — Assessment & Plan Note (Signed)
Asymptomatic on her PPI 

## 2013-02-14 NOTE — Patient Instructions (Addendum)
1. I hope you and your husband get good news 2. Try the Ativan 1/2 pill as needed. May increase to 1 full pill if needed. 3. Call me if your blood pressure remains elevated. 4. See me in one year

## 2013-02-20 ENCOUNTER — Other Ambulatory Visit: Payer: Self-pay | Admitting: Internal Medicine

## 2013-02-22 ENCOUNTER — Other Ambulatory Visit: Payer: Self-pay | Admitting: *Deleted

## 2013-02-22 DIAGNOSIS — E039 Hypothyroidism, unspecified: Secondary | ICD-10-CM

## 2013-02-22 MED ORDER — LEVOTHYROXINE SODIUM 100 MCG PO TABS: 100.0000 ug | ORAL_TABLET | Freq: Every day | ORAL | Status: DC

## 2013-03-13 ENCOUNTER — Ambulatory Visit (INDEPENDENT_AMBULATORY_CARE_PROVIDER_SITE_OTHER): Payer: Medicare Other | Admitting: Pharmacist

## 2013-03-13 ENCOUNTER — Other Ambulatory Visit (INDEPENDENT_AMBULATORY_CARE_PROVIDER_SITE_OTHER): Payer: Medicare Other

## 2013-03-13 DIAGNOSIS — E039 Hypothyroidism, unspecified: Secondary | ICD-10-CM

## 2013-03-13 DIAGNOSIS — Z7901 Long term (current) use of anticoagulants: Secondary | ICD-10-CM

## 2013-03-13 DIAGNOSIS — I8290 Acute embolism and thrombosis of unspecified vein: Secondary | ICD-10-CM

## 2013-03-13 DIAGNOSIS — D682 Hereditary deficiency of other clotting factors: Secondary | ICD-10-CM

## 2013-03-13 DIAGNOSIS — I749 Embolism and thrombosis of unspecified artery: Secondary | ICD-10-CM

## 2013-03-13 LAB — POCT INR: INR: 3

## 2013-03-13 NOTE — Patient Instructions (Signed)
Patient instructed to take medications as defined in the Anti-coagulation Track section of this encounter.  Patient instructed to take today's dose.  Patient verbalized understanding of these instructions.    

## 2013-03-13 NOTE — Progress Notes (Signed)
Anti-Coagulation Progress Note  Pamela [name] is a 67 y.o. female who is currently on an anti-coagulation regimen.    RECENT RESULTS: Recent results are below, the most recent result is correlated with a dose of 18 mg. per week: Lab Results  Component Value Date   INR 3.0 03/13/2013   INR 2.60 02/06/2013   INR 1.90 01/16/2013    ANTI-COAG DOSE: Anticoagulation Dose Instructions as of 03/13/2013     [name] Tue Wed Thu Fri Sat   New Dose 2 mg 2 mg 2 mg 4 mg 2 mg 2 mg 2 mg       ANTICOAG SUMMARY: Anticoagulation Episode Summary   Current INR goal 2.0-3.0  Next INR check 04/17/2013  INR from last check 3.0 (03/13/2013)  Weekly max dose   Target end date Indefinite  [facility]  Preferred lab   Send INR reminders to ANTICOAG [facility]   Indications  VTE (venous thromboembolism) [453.9] DVT HX OF (Resolved) [V12.51] Long term current use of anticoagulant [V58.61]        Comments       Anticoagulation Care Providers   Provider Role Specialty Phone number   Zoila Shutter, MD  Internal Medicine [phone]      ANTICOAG TODAY: Anticoagulation Summary as of 03/13/2013   INR goal 2.0-3.0  Selected INR 3.0 (03/13/2013)  Next INR check 04/17/2013  Target end date Indefinite   Indications  VTE (venous thromboembolism) [453.9] DVT HX OF (Resolved) [V12.51] Long term current use of anticoagulant [V58.61]      Anticoagulation Episode Summary   [facility]   Preferred lab    Send INR reminders to ANTICOAG [facility]   Comments     Anticoagulation Care Providers   Provider Role Specialty Phone number   Zoila Shutter, MD  Internal Medicine [phone]      PATIENT INSTRUCTIONS: Patient Instructions  Patient instructed to take medications as defined in the Anti-coagulation Track section of this encounter.  Patient instructed to take today's dose.  Patient verbalized understanding of these instructions.       FOLLOW-UP Return  in 5 weeks (on 04/17/2013) for Follow up INR at 1000h.  Hulen Luster, III Pharm.D., CACP

## 2013-03-14 ENCOUNTER — Ambulatory Visit: Payer: Medicare Other | Admitting: Internal Medicine

## 2013-03-14 ENCOUNTER — Encounter: Payer: Self-pay | Admitting: Internal Medicine

## 2013-03-30 ENCOUNTER — Other Ambulatory Visit: Payer: Self-pay | Admitting: Internal Medicine

## 2013-03-30 DIAGNOSIS — I10 Essential (primary) hypertension: Secondary | ICD-10-CM

## 2013-03-30 MED ORDER — HYDROCHLOROTHIAZIDE 12.5 MG PO CAPS: ORAL_CAPSULE | ORAL | Status: DC

## 2013-04-17 ENCOUNTER — Ambulatory Visit: Payer: Medicare Other

## 2013-04-24 ENCOUNTER — Ambulatory Visit (INDEPENDENT_AMBULATORY_CARE_PROVIDER_SITE_OTHER): Payer: Medicare Other | Admitting: Pharmacist

## 2013-04-24 DIAGNOSIS — I8290 Acute embolism and thrombosis of unspecified vein: Secondary | ICD-10-CM

## 2013-04-24 DIAGNOSIS — I749 Embolism and thrombosis of unspecified artery: Secondary | ICD-10-CM

## 2013-04-24 DIAGNOSIS — D682 Hereditary deficiency of other clotting factors: Secondary | ICD-10-CM

## 2013-04-24 DIAGNOSIS — Z7901 Long term (current) use of anticoagulants: Secondary | ICD-10-CM

## 2013-04-24 LAB — POCT INR: INR: 1.9

## 2013-04-24 NOTE — Progress Notes (Signed)
Anti-Coagulation Progress Note  Pamela [name] is a 67 y.o. female who is currently on an anti-coagulation regimen.    RECENT RESULTS: Recent results are below, the most recent result is correlated with a dose of 16 mg. per week: Lab Results  Component Value Date   INR 1.90 04/24/2013   INR 3.0 03/13/2013   INR 2.60 02/06/2013    ANTI-COAG DOSE: Anticoagulation Dose Instructions as of 04/24/2013     [name] Tue Wed Thu Fri Sat   New Dose 2 mg 4 mg 2 mg 4 mg 2 mg 2 mg 2 mg       ANTICOAG SUMMARY: Anticoagulation Episode Summary   Current INR goal 2.0-3.0  Next INR check 05/22/2013  INR from last check 1.90! (04/24/2013)  Weekly max dose   Target end date Indefinite  [facility]  Preferred lab   Send INR reminders to ANTICOAG [facility]   Indications  VTE (venous thromboembolism) [453.9] DVT HX OF (Resolved) [V12.51] Long term current use of anticoagulant [V58.61]        Comments       Anticoagulation Care Providers   Provider Role Specialty Phone number   Zoila Shutter, MD  Internal Medicine [phone]      ANTICOAG TODAY: Anticoagulation Summary as of 04/24/2013   INR goal 2.0-3.0  Selected INR 1.90! (04/24/2013)  Next INR check 05/22/2013  Target end date Indefinite   Indications  VTE (venous thromboembolism) [453.9] DVT HX OF (Resolved) [V12.51] Long term current use of anticoagulant [V58.61]      Anticoagulation Episode Summary   [facility]   Preferred lab    Send INR reminders to ANTICOAG [facility]   Comments     Anticoagulation Care Providers   Provider Role Specialty Phone number   Zoila Shutter, MD  Internal Medicine [phone]      PATIENT INSTRUCTIONS: Patient Instructions  Patient instructed to take medications as defined in the Anti-coagulation Track section of this encounter.  Patient instructed to take today's dose.  Patient verbalized understanding of these instructions.        FOLLOW-UP Return in about 4 weeks (around 05/22/2013) for Follow up INR at 1015h.  Hulen Luster, III Pharm.D., CACP

## 2013-04-24 NOTE — Patient Instructions (Signed)
Patient instructed to take medications as defined in the Anti-coagulation Track section of this encounter.  Patient instructed to take today's dose.  Patient verbalized understanding of these instructions.    

## 2013-04-26 ENCOUNTER — Emergency Department (HOSPITAL_COMMUNITY)
Admission: EM | Admit: 2013-04-26 | Discharge: 2013-04-26 | Disposition: A | Discharge status: Home / Self Care | Payer: Medicare Other | Source: Home / Self Care

## 2013-04-26 ENCOUNTER — Encounter (HOSPITAL_COMMUNITY): Payer: Self-pay | Admitting: Emergency Medicine

## 2013-04-26 DIAGNOSIS — N3090 Cystitis, unspecified without hematuria: Secondary | ICD-10-CM

## 2013-04-26 LAB — POCT URINALYSIS DIP (DEVICE)
Glucose, UA: NEGATIVE mg/dL
Ketones, ur: 15 mg/dL — AB
Leukocytes, UA: NEGATIVE
Protein, ur: NEGATIVE mg/dL
Specific Gravity, Urine: 1.02 (ref 1.005–1.030)
Urobilinogen, UA: 0.2 mg/dL (ref 0.0–1.0)

## 2013-04-26 MED ORDER — SULFAMETHOXAZOLE-TMP DS 800-160 MG PO TABS: 1.0000 | ORAL_TABLET | Freq: Two times a day (BID) | ORAL | Status: DC

## 2013-04-26 NOTE — ED Notes (Signed)
Pt  Reports  Symptoms  Of a  uti  With  Cramping   X   4  Days     She  Ambulated  To  Room  With a  Steady fluid  gait

## 2013-04-26 NOTE — ED Provider Notes (Signed)
CSN: [id number]     Arrival date & time 04/26/13  6295 History   First MD Initiated Contact with Patient 04/26/13 0915     Chief Complaint  Patient presents with  . 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, cholelithiasis status post ERCP and sphincterectomy, hypothyroidism [needs TSH in the future],, uncontrolled hyperlipidemia  mild obesity hyperlipidemia and former smoker presents to [facility] on 10/22 with dysuria Patient states that the dysuria started on the way back from the beach with her husband on 10/18 She does not have much frequency, has not noticed any burning per se but her symptoms rather are cramping-like sensation in the lower abdomen' The cramping sensation is relieved by sitting in various positions and was actually worsened by drive back from the beach She does not have any vaginal bleeding or unusual diarrhea-she does have chronic diarrhea from cholecystectomy many years ago. She says she felt warm and uncomfortable on the past couple of nights and Tylenol for this but did not measure her temperature She also states that on 1017 she developed a cough which became productive of white sputum but this seems to be going away and is actually much better this morning She has not been around sick contacts small children or anyone with fever or chills as per her knowledge She is not sexually active She has no vaginal discharge  Past Medical History  Diagnosis Date  . HYPERLIPIDEMIA 06/03/2006  . OBESITY, MILD 04/24/2008  . GLAUCOMA 04/24/2008  . CHOLELITHIASIS, WITH OBSTRUCTION 04/21/2006    s/p ERCP,sprincterotomy, stent ([facility])  . 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 Hypothyroidism lifelong  . Tenosynovitis 01/2004    [facility]   Past Surgical History  Procedure Laterality Date  . Cholecystectomy      2004   Family History  Problem Relation Age of Onset   . Cancer Mother     H&N, smoker  . Cancer Sister     lung, 2011   History  Substance Use Topics  . Smoking status: Former Smoker -- 1.00 packs/day for 20 years    Quit date: 07/06/1985  . Smokeless tobacco: Not on file     Comment: Quit 1988  . Alcohol Use: Yes     Comment: 2-3 glasses of wine daily.   OB History   Grav Para Term Preterm Abortions TAB SAB Ect Mult Living                 Review of Systems Denies specific cough, positive for fever No blurred vision no double vision no weakness in any one side of Oddi no chest pain no shortness of breath No nausea no vomiting Pain in lower quadrant No upper quadrant pain at all, no burning in the stomach  Allergies  Review of patient's allergies indicates no known allergies.  Home Medications   Current Outpatient Rx  Name  Route  Sig  Dispense  Refill  . colestipol (COLESTID) 1 G tablet   Oral   Take 1 g by mouth 2 (two) times daily.         . hydrochlorothiazide (MICROZIDE) 12.5 MG capsule      TAKE ONE CAPSULE BY MOUTH DAILY   30 capsule   11   . levothyroxine (LEVOTHROID) 100 MCG tablet   Oral   Take 1 tablet (100 mcg total) by mouth daily.   90 tablet  3   . LORazepam (ATIVAN) 1 MG tablet   Oral   Take 1 tablet (1 mg total) by mouth daily as needed for anxiety.   30 tablet   0   . Multiple Vitamins-Calcium (DAILY COMBO MULTIVITS/CALCIUM) TABS   Oral   Take 1 tablet by mouth daily.           . pantoprazole (PROTONIX) 40 MG tablet      TAKE ONE (1) TABLET BY MOUTH EVERY DAY   90 tablet   1   . warfarin (COUMADIN) 4 MG tablet      TAKE AS DIRECTED BY ANTICOAGULATION CLINIC PROVIDER   30 tablet   5    BP 148/67  Pulse 100  Temp(Src) 99.9 F (37.7 C) (Oral)  Resp 20  SpO2 99% Physical Exam  Alert pleasant oriented, EOMI, NCAT, Ears are clear No submandibular lymphadenopathy Pupils intact extraocular movements intact, some injection to both eyes contacts in situ Moderate dentition  with multiple caries that are covered with fillings Chest clinically clear with no added sound or fremitus no resonance no wheeze Abdomen soft but slightly tender in lower quadrant. No rebound or guarding distention, obese excellent range of motion intact both upper and lower extremities, 5/5 power reflexes 2/3. Euthymic  ED Course  Procedures (including critical care time) Labs Review Labs Reviewed - No data to display Imaging Review No results found.  EKG Interpretation     Ventricular Rate:    PR Interval:    QRS Duration:   QT Interval:    QTC Calculation:   R Axis:     Text Interpretation:              MDM  No diagnosis found. Patient presents with a clinical diagnosis of UTI-she is not overtly febrile although her pulse is a little high. She has taken Keflex in the past without issues UA done shows no leukocyte esterase, no nitrites-clinically she does not require a CBC And still convinced that this is cystitis and will treat accordingly with Bactrim DS 4 5 days-discussed with the patient that Bactrim can increase the INR levels and recommended close followup as an outpatient to get and 9 are done as well as a TSH that has been pending from Dr. Donnelly Stager last note If this is no better, she will need to followup with her primary care physician, Dr. Blanch Media of the teaching service for further workup If she has a fever or chills nausea vomiting back pain or debilitating abdominal pain, she should present herself to the emergency room at [facility] She is stable for discharge from my interpretation   Pleas Koch, MD [facility] [phone] Mura, MD 04/26/13 [phone]

## 2013-05-11 ENCOUNTER — Other Ambulatory Visit: Payer: Self-pay

## 2013-05-22 ENCOUNTER — Ambulatory Visit (INDEPENDENT_AMBULATORY_CARE_PROVIDER_SITE_OTHER): Payer: Medicare Other | Admitting: Pharmacist

## 2013-05-22 DIAGNOSIS — I8290 Acute embolism and thrombosis of unspecified vein: Secondary | ICD-10-CM

## 2013-05-22 DIAGNOSIS — D682 Hereditary deficiency of other clotting factors: Secondary | ICD-10-CM

## 2013-05-22 DIAGNOSIS — Z7901 Long term (current) use of anticoagulants: Secondary | ICD-10-CM

## 2013-05-22 DIAGNOSIS — I749 Embolism and thrombosis of unspecified artery: Secondary | ICD-10-CM

## 2013-05-22 LAB — POCT INR: INR: 3.9

## 2013-05-22 NOTE — Progress Notes (Signed)
Anti-Coagulation Progress Note  Pamela [name] is a 67 y.o. female who is currently on an anti-coagulation regimen.    RECENT RESULTS: Recent results are below, the most recent result is correlated with a dose of 18 mg. per week:  PATIENT IS TO UNDERGO COLONOSCOPY on 15-DEC-14 by Dr. Ewing Schlein. She RAISES THE QUESTION OF REQUIREMENT (or NOT) of LMWH "bridge therapy". Dr. Shea Stakes advise YOUR thoughts on this. We can discuss if you wish.  Lab Results  Component Value Date   INR 3.90 05/22/2013   INR 1.90 04/24/2013   INR 3.0 03/13/2013    ANTI-COAG DOSE: Anticoagulation Dose Instructions as of 05/22/2013     [name] Tue Wed Thu Fri Sat   New Dose 2 mg 4 mg 2 mg 2 mg 2 mg 2 mg 2 mg       ANTICOAG SUMMARY: Anticoagulation Episode Summary   Current INR goal 2.0-3.0  Next INR check 06/12/2013  INR from last check 3.90! (05/22/2013)  Weekly max dose   Target end date Indefinite  [facility]  Preferred lab   Send INR reminders to ANTICOAG IMP   Indications  VTE (venous thromboembolism) [453.9] DVT HX OF (Resolved) [V12.51] Long term current use of anticoagulant [V58.61]        Comments       Anticoagulation Care Providers   Provider Role Specialty Phone number   Zoila Shutter, MD  Internal Medicine [phone]      ANTICOAG TODAY: Anticoagulation Summary as of 05/22/2013   INR goal 2.0-3.0  Selected INR 3.90! (05/22/2013)  Next INR check 06/12/2013  Target end date Indefinite   Indications  VTE (venous thromboembolism) [453.9] DVT HX OF (Resolved) [V12.51] Long term current use of anticoagulant [V58.61]      Anticoagulation Episode Summary   [facility]   Preferred lab    Send INR reminders to ANTICOAG IMP   Comments     Anticoagulation Care Providers   Provider Role Specialty Phone number   Zoila Shutter, MD  Internal Medicine [phone]      PATIENT INSTRUCTIONS: Patient Instructions  Patient  instructed to take medications as defined in the Anti-coagulation Track section of this encounter.  Patient instructed to take today's dose.  Patient verbalized understanding of these instructions.       FOLLOW-UP Return in 3 weeks (on 06/12/2013) for Follow up INR at 1015h.  Hulen Luster, III Pharm.D., CACP

## 2013-05-22 NOTE — Patient Instructions (Signed)
Patient instructed to take medications as defined in the Anti-coagulation Track section of this encounter.  Patient instructed to take today's dose.  Patient verbalized understanding of these instructions.    

## 2013-06-12 ENCOUNTER — Ambulatory Visit (INDEPENDENT_AMBULATORY_CARE_PROVIDER_SITE_OTHER): Payer: Medicare Other | Admitting: Pharmacist

## 2013-06-12 DIAGNOSIS — I749 Embolism and thrombosis of unspecified artery: Secondary | ICD-10-CM

## 2013-06-12 DIAGNOSIS — D682 Hereditary deficiency of other clotting factors: Secondary | ICD-10-CM

## 2013-06-12 DIAGNOSIS — I8290 Acute embolism and thrombosis of unspecified vein: Secondary | ICD-10-CM

## 2013-06-12 DIAGNOSIS — Z7901 Long term (current) use of anticoagulants: Secondary | ICD-10-CM

## 2013-06-12 LAB — POCT INR: INR: 2.3

## 2013-06-12 NOTE — Progress Notes (Signed)
Anti-Coagulation Progress Note  ISABELLAH SOBOCINSKI is a 67 y.o. female who is currently on an anti-coagulation regimen.    RECENT RESULTS: Recent results are below, the most recent result is correlated with a dose of 16 mg. per week: Lab Results  Component Value Date   INR 2.3 06/12/2013   INR 3.90 05/22/2013   INR 1.90 04/24/2013    ANTI-COAG DOSE: Anticoagulation Dose Instructions as of 06/12/2013     [name] Tue Wed Thu Fri Sat   New Dose 2 mg 4 mg 2 mg 2 mg 4 mg 2 mg 2 mg       ANTICOAG SUMMARY: Anticoagulation Episode Summary   Current INR goal 2.0-3.0  Next INR check 07/17/2013  INR from last check 2.3 (06/12/2013)  Weekly max dose   Target end date Indefinite  [facility]  Preferred lab   Send INR reminders to ANTICOAG IMP   Indications  VTE (venous thromboembolism) [453.9] DVT HX OF (Resolved) [V12.51] Long term current use of anticoagulant [V58.61]        Comments       Anticoagulation Care Providers   Provider Role Specialty Phone number   Zoila Shutter, MD  Internal Medicine [phone]      ANTICOAG TODAY: Anticoagulation Summary as of 06/12/2013   INR goal 2.0-3.0  Selected INR 2.3 (06/12/2013)  Next INR check 07/17/2013  Target end date Indefinite   Indications  VTE (venous thromboembolism) [453.9] DVT HX OF (Resolved) [V12.51] Long term current use of anticoagulant [V58.61]      Anticoagulation Episode Summary   [facility]   Preferred lab    Send INR reminders to ANTICOAG IMP   Comments     Anticoagulation Care Providers   Provider Role Specialty Phone number   Zoila Shutter, MD  Internal Medicine [phone]      PATIENT INSTRUCTIONS: Patient Instructions  Patient instructed to take medications as defined in the Anti-coagulation Track section of this encounter.  Patient instructed to take today's dose.  Patient verbalized understanding of these instructions.        FOLLOW-UP Return in 5 weeks (on 07/17/2013) for Follow up INR at 1000h.  Hulen Luster, III Pharm.D., CACP

## 2013-06-12 NOTE — Patient Instructions (Signed)
Patient instructed to take medications as defined in the Anti-coagulation Track section of this encounter.  Patient instructed to take today's dose.  Patient verbalized understanding of these instructions.    

## 2013-06-15 ENCOUNTER — Other Ambulatory Visit: Payer: Self-pay | Admitting: *Deleted

## 2013-06-15 MED ORDER — LORAZEPAM 1 MG PO TABS: 1.0000 mg | ORAL_TABLET | Freq: Every day | ORAL | Status: DC | PRN

## 2013-06-15 NOTE — Telephone Encounter (Signed)
She only used 30 pills since 02/14/13. Low risk usage. Will refill another 30.

## 2013-06-15 NOTE — Telephone Encounter (Signed)
Lorazepam called to [facility].

## 2013-07-05 ENCOUNTER — Other Ambulatory Visit: Payer: Self-pay | Admitting: Internal Medicine

## 2013-07-17 ENCOUNTER — Ambulatory Visit (INDEPENDENT_AMBULATORY_CARE_PROVIDER_SITE_OTHER): Payer: Medicare Other | Admitting: Pharmacist

## 2013-07-17 DIAGNOSIS — D682 Hereditary deficiency of other clotting factors: Secondary | ICD-10-CM

## 2013-07-17 DIAGNOSIS — I8290 Acute embolism and thrombosis of unspecified vein: Secondary | ICD-10-CM

## 2013-07-17 DIAGNOSIS — I749 Embolism and thrombosis of unspecified artery: Secondary | ICD-10-CM

## 2013-07-17 DIAGNOSIS — Z7901 Long term (current) use of anticoagulants: Secondary | ICD-10-CM

## 2013-07-17 LAB — POCT INR: INR: 2

## 2013-07-17 NOTE — Progress Notes (Signed)
Anti-Coagulation Progress Note  Pamela [name] is a 68 y.o. female who is currently on an anti-coagulation regimen.    RECENT RESULTS: Recent results are below, the most recent result is correlated with a dose of 18 mg. per week: Lab Results  Component Value Date   INR 2.0 07/17/2013   INR 2.3 06/12/2013   INR 3.90 05/22/2013    ANTI-COAG DOSE: Anticoagulation Dose Instructions as of 07/17/2013     [name] Tue Wed Thu Fri Sat   New Dose 2 mg 4 mg 2 mg 4 mg 2 mg 4 mg 2 mg       ANTICOAG SUMMARY: Anticoagulation Episode Summary   Current INR goal 2.0-3.0  Next INR check 08/14/2013  INR from last check 2.0 (07/17/2013)  Weekly max dose   Target end date Indefinite  [facility]  Preferred lab   Send INR reminders to ANTICOAG IMP   Indications  VTE (venous thromboembolism) [453.9] DVT HX OF (Resolved) [V12.51] Long term current use of anticoagulant [V58.61]        Comments       Anticoagulation Care Providers   Provider Role Specialty Phone number   Burman Freestone, MD  Internal Medicine [phone]      ANTICOAG TODAY: Anticoagulation Summary as of 07/17/2013   INR goal 2.0-3.0  Selected INR 2.0 (07/17/2013)  Next INR check 08/14/2013  Target end date Indefinite   Indications  VTE (venous thromboembolism) [453.9] DVT HX OF (Resolved) [V12.51] Long term current use of anticoagulant [V58.61]      Anticoagulation Episode Summary   [facility]   Preferred lab    Send INR reminders to ANTICOAG IMP   Comments     Anticoagulation Care Providers   Provider Role Specialty Phone number   Burman Freestone, MD  Internal Medicine [phone]      PATIENT INSTRUCTIONS: Patient Instructions  Patient instructed to take medications as defined in the Anti-coagulation Track section of this encounter.  Patient instructed to take today's dose.  Patient verbalized understanding of these instructions.        FOLLOW-UP Return in about 4 weeks (around 08/14/2013) for Follow up INR at 1030h.  [name], III Pharm.D., CACP

## 2013-07-17 NOTE — Patient Instructions (Signed)
Patient instructed to take medications as defined in the Anti-coagulation Track section of this encounter.  Patient instructed to take today's dose.  Patient verbalized understanding of these instructions.    

## 2013-07-26 ENCOUNTER — Other Ambulatory Visit: Payer: Self-pay | Admitting: *Deleted

## 2013-07-27 MED ORDER — LORAZEPAM 1 MG PO TABS: 1.0000 mg | ORAL_TABLET | Freq: Every day | ORAL | Status: DC | PRN

## 2013-07-27 NOTE — Telephone Encounter (Signed)
Called in.

## 2013-08-14 ENCOUNTER — Ambulatory Visit (INDEPENDENT_AMBULATORY_CARE_PROVIDER_SITE_OTHER): Payer: Medicare Other | Admitting: Pharmacist

## 2013-08-14 DIAGNOSIS — Z7901 Long term (current) use of anticoagulants: Secondary | ICD-10-CM

## 2013-08-14 DIAGNOSIS — D682 Hereditary deficiency of other clotting factors: Secondary | ICD-10-CM

## 2013-08-14 DIAGNOSIS — Z86718 Personal history of other venous thrombosis and embolism: Secondary | ICD-10-CM

## 2013-08-14 DIAGNOSIS — I8290 Acute embolism and thrombosis of unspecified vein: Secondary | ICD-10-CM

## 2013-08-14 LAB — POCT INR: INR: 1.9

## 2013-08-14 NOTE — Patient Instructions (Signed)
Patient instructed to take medications as defined in the Anti-coagulation Track section of this encounter.  Patient instructed to take today's dose.  Patient verbalized understanding of these instructions.    

## 2013-08-14 NOTE — Progress Notes (Signed)
Anti-Coagulation Progress Note  Pamela [name] is a 68 y.o. female who is currently on an anti-coagulation regimen.    RECENT RESULTS: Recent results are below, the most recent result is correlated with a dose of 20 mg. per week: Lab Results  Component Value Date   INR 1.90 08/14/2013   INR 2.0 07/17/2013   INR 2.3 06/12/2013    ANTI-COAG DOSE: Anticoagulation Dose Instructions as of 08/14/2013     [name] Tue Wed Thu Fri Sat   New Dose 2 mg 4 mg 4 mg 4 mg 4 mg 4 mg 2 mg       ANTICOAG SUMMARY: Anticoagulation Episode Summary   Current INR goal 2.0-3.0  Next INR check 08/28/2013  INR from last check 1.90! (08/14/2013)  Weekly max dose   Target end date Indefinite  [facility]  Preferred lab   Send INR reminders to ANTICOAG IMP   Indications  VTE (venous thromboembolism) [453.9] DVT HX OF (Resolved) [V12.51] Long term current use of anticoagulant [V58.61]        Comments       Anticoagulation Care Providers   Provider Role Specialty Phone number   Burman Freestone, MD  Internal Medicine [phone]      ANTICOAG TODAY: Anticoagulation Summary as of 08/14/2013   INR goal 2.0-3.0  Selected INR 1.90! (08/14/2013)  Next INR check 08/28/2013  Target end date Indefinite   Indications  VTE (venous thromboembolism) [453.9] DVT HX OF (Resolved) [V12.51] Long term current use of anticoagulant [V58.61]      Anticoagulation Episode Summary   [facility]   Preferred lab    Send INR reminders to ANTICOAG IMP   Comments     Anticoagulation Care Providers   Provider Role Specialty Phone number   Burman Freestone, MD  Internal Medicine [phone]      PATIENT INSTRUCTIONS: Patient Instructions  Patient instructed to take medications as defined in the Anti-coagulation Track section of this encounter.  Patient instructed to take today's dose.  Patient verbalized understanding of these instructions.        FOLLOW-UP Return in 2 weeks (on 08/28/2013) for Follow up INR at 1000h.  [name], III Pharm.D., CACP

## 2013-08-28 ENCOUNTER — Ambulatory Visit (INDEPENDENT_AMBULATORY_CARE_PROVIDER_SITE_OTHER): Payer: Medicare Other | Admitting: Pharmacist

## 2013-08-28 DIAGNOSIS — I749 Embolism and thrombosis of unspecified artery: Secondary | ICD-10-CM

## 2013-08-28 DIAGNOSIS — D682 Hereditary deficiency of other clotting factors: Secondary | ICD-10-CM

## 2013-08-28 DIAGNOSIS — Z7901 Long term (current) use of anticoagulants: Secondary | ICD-10-CM

## 2013-08-28 DIAGNOSIS — I8290 Acute embolism and thrombosis of unspecified vein: Secondary | ICD-10-CM

## 2013-08-28 LAB — POCT INR: INR: 4.2

## 2013-08-28 NOTE — Patient Instructions (Signed)
Patient instructed to take medications as defined in the Anti-coagulation Track section of this encounter.  Patient instructed to take today's dose.  Patient verbalized understanding of these instructions.  Patient instructed to DISCONTINUE WARFARIN LAST DOSE to be taken on 8-MAR-15--then come to IM [facility] for further instructions regarding low molecular weight heparin which will be dosed at 1mg /kg SQ q12h--commencing 36h after discontinuation of warfarin and being continued up until 24h of within her planned colonoscopy.

## 2013-08-28 NOTE — Progress Notes (Signed)
Anti-Coagulation Progress Note  Pamela [name] is a 68 y.o. female who is currently on an anti-coagulation regimen.    RECENT RESULTS: Recent results are below, the most recent result is correlated with a dose of 24 mg. per week: Lab Results  Component Value Date   INR 4.20 08/28/2013   INR 1.90 08/14/2013   INR 2.0 07/17/2013    ANTI-COAG DOSE: Anticoagulation Dose Instructions as of 08/28/2013     [name] Tue Wed Thu Fri Sat   New Dose 2 mg 4 mg 4 mg 4 mg 4 mg 4 mg 2 mg       ANTICOAG SUMMARY: Anticoagulation Episode Summary   Current INR goal 2.0-3.0  Next INR check 09/11/2013  INR from last check 4.20! (08/28/2013)  Weekly max dose   Target end date Indefinite  [facility]  Preferred lab   Send INR reminders to ANTICOAG IMP   Indications  VTE (venous thromboembolism) [453.9] DVT HX OF (Resolved) [V12.51] Long term current use of anticoagulant [V58.61]        Comments       Anticoagulation Care Providers   Provider Role Specialty Phone number   Burman Freestone, MD  Internal Medicine [phone]      ANTICOAG TODAY: Anticoagulation Summary as of 08/28/2013   INR goal 2.0-3.0  Selected INR 4.20! (08/28/2013)  Next INR check 09/11/2013  Target end date Indefinite   Indications  VTE (venous thromboembolism) [453.9] DVT HX OF (Resolved) [V12.51] Long term current use of anticoagulant [V58.61]      Anticoagulation Episode Summary   [facility]   Preferred lab    Send INR reminders to ANTICOAG IMP   Comments     Anticoagulation Care Providers   Provider Role Specialty Phone number   Burman Freestone, MD  Internal Medicine [phone]      PATIENT INSTRUCTIONS: Patient Instructions  Patient instructed to take medications as defined in the Anti-coagulation Track section of this encounter.  Patient instructed to take today's dose.  Patient verbalized understanding of these instructions.  Patient instructed to  DISCONTINUE WARFARIN LAST DOSE to be taken on 8-MAR-15--then come to IM [facility] for further instructions regarding low molecular weight heparin which will be dosed at 1mg /kg SQ q12h--commencing 36h after discontinuation of warfarin and being continued up until 24h of within her planned colonoscopy.      FOLLOW-UP Return in 2 weeks (on 09/11/2013) for Follow up INR at 1000h.  [name], III Pharm.D., CACP

## 2013-09-11 ENCOUNTER — Ambulatory Visit (INDEPENDENT_AMBULATORY_CARE_PROVIDER_SITE_OTHER): Payer: Medicare Other | Admitting: Pharmacist

## 2013-09-11 DIAGNOSIS — D682 Hereditary deficiency of other clotting factors: Secondary | ICD-10-CM

## 2013-09-11 DIAGNOSIS — Z7901 Long term (current) use of anticoagulants: Secondary | ICD-10-CM

## 2013-09-11 DIAGNOSIS — I749 Embolism and thrombosis of unspecified artery: Secondary | ICD-10-CM

## 2013-09-11 DIAGNOSIS — I8290 Acute embolism and thrombosis of unspecified vein: Secondary | ICD-10-CM

## 2013-09-11 LAB — POCT INR: INR: 3.9

## 2013-09-11 NOTE — Progress Notes (Signed)
Anti-Coagulation Progress Note  Pamela [name] is a 68 y.o. female who is currently on an anti-coagulation regimen.    RECENT RESULTS: Recent results are below, the most recent result is correlated with a dose of 22 mg. per week. We DISCONTINUE warfarin effective TODAY--through Friday, 13-MAR-15 in anticipation of colonoscopy on Friday 13-MARCH-15. She was provided LMWH 120mg  syringes #5 and instructed to follow the bridging instructions as provided:  1. Monday, September 11, 2013 do not take warfarin. 2. Tuesday, September 12, 2013 do not take warfarin.  3. Wednesday, March 11, do not take warfarin. 4. Wednesday, March 11, give one injection of Low molecular weight heparin enoxaparin 120mg  subcutaneously at 8:00AM 5. Thursday, March 12, do not take warfarin. 6. Thursday, March 12, give one injection of low molecular weight heparin enoxaparin 120mg  subcutaneously at 8:00AM-this will be your last dose prior to procedure.  7. When GI Medicine Physician performing your procedure indicates you can-recommence warfarin at the dose shown on last instructions provided by the internal medicine clinic.  You will give yourself concomitantly-with the warfarin, an injection of low molecular weight heparin 120mg  subcutaneously at 8:00AM. Do this "overlap" with warfarin for a total of 3 days combined low molecular weight heparin + warfarin. After these 3 days of overlapping warfarin with low molecular weight heparin you will take warfarin only as instructed.    Lab Results  Component Value Date   INR 3.90 09/11/2013   INR 4.20 08/28/2013   INR 1.90 08/14/2013    ANTI-COAG DOSE: Anticoagulation Dose Instructions as of 09/11/2013     [name] Tue Wed Thu Fri Sat   New Dose 2 mg 2 mg 4 mg 2 mg 2 mg 4 mg 2 mg       ANTICOAG SUMMARY: Anticoagulation Episode Summary   Current INR goal 2.0-3.0  Next INR check 10/09/2013  INR from last check 3.90! (09/11/2013)  Weekly max dose   Target end date Indefinite  INR check  location [facility]  Preferred lab   Send INR reminders to ANTICOAG IMP   Indications  VTE (venous thromboembolism) [453.9] DVT HX OF (Resolved) [V12.51] Long term current use of anticoagulant [V58.61]        Comments       Anticoagulation Care Providers   Provider Role Specialty Phone number   Burman Freestone, MD  Internal Medicine [phone]      ANTICOAG TODAY: Anticoagulation Summary as of 09/11/2013   INR goal 2.0-3.0  Selected INR 3.90! (09/11/2013)  Next INR check 10/09/2013  Target end date Indefinite   Indications  VTE (venous thromboembolism) [453.9] DVT HX OF (Resolved) [V12.51] Long term current use of anticoagulant [V58.61]      Anticoagulation Episode Summary   [facility]   Preferred lab    Send INR reminders to ANTICOAG IMP   Comments     Anticoagulation Care Providers   Provider Role Specialty Phone number   Burman Freestone, MD  Internal Medicine [phone]      PATIENT INSTRUCTIONS: Patient Instructions  Patient instructed to take medications as defined in the Anti-coagulation Track section of this encounter.  Patient instructed to OMIT/HOLD today's dose--THROUGH Friday 13-MAR-15 as she is having a colonoscopy performed on Friday 13-MAR-15.  She has been instructed to OMIT/HOLD as above--and will implement the plan (a copy of which was provided to her) as follows: 8. Monday, September 11, 2013 do not take warfarin. 9. Tuesday, September 12, 2013 do not take warfarin.  10. Wednesday, March 11, do not take warfarin. 11. Wednesday, March 11, give one injection of Low molecular weight heparin enoxaparin 120mg  subcutaneously at 8:00AM 12. Thursday, March 12, do not take warfarin. 13. Thursday, March 12, give one injection of low molecular weight heparin enoxaparin 120mg  subcutaneously at 8:00AM-this will be your last dose prior to procedure.  14. When GI Medicine Physician performing your procedure indicates you can-recommence  warfarin at the dose shown on last instructions provided by the internal medicine clinic.  You will give yourself concomitantly-with the warfarin, an injection of low molecular weight heparin 120mg  subcutaneously at 8:00AM. Do this "overlap" with warfarin for a total of 3 days combined low molecular weight heparin + warfarin. After these 3 days of overlapping warfarin with low molecular weight heparin you will take warfarin only as instructed.    Patient verbalized understanding of these instructions.       FOLLOW-UP Return in 4 weeks (on 10/09/2013) for Follow up INR at 1015h.  [name], III Pharm.D., CACP

## 2013-09-11 NOTE — Patient Instructions (Signed)
Patient instructed to take medications as defined in the Anti-coagulation Track section of this encounter.  Patient instructed to OMIT/HOLD today's dose--THROUGH Friday 13-MAR-15 as she is having a colonoscopy performed on Friday 13-MAR-15.  She has been instructed to OMIT/HOLD as above--and will implement the plan (a copy of which was provided to her) as follows: 1. Monday, September 11, 2013 do not take warfarin. 2. Tuesday, September 12, 2013 do not take warfarin.  3. Wednesday, March 11, do not take warfarin. 4. Wednesday, March 11, give one injection of Low molecular weight heparin enoxaparin 120mg  subcutaneously at 8:00AM 5. Thursday, March 12, do not take warfarin. 6. Thursday, March 12, give one injection of low molecular weight heparin enoxaparin 120mg  subcutaneously at 8:00AM-this will be your last dose prior to procedure.  7. When GI Medicine Physician performing your procedure indicates you can-recommence warfarin at the dose shown on last instructions provided by the internal medicine clinic.  You will give yourself concomitantly-with the warfarin, an injection of low molecular weight heparin 120mg  subcutaneously at 8:00AM. Do this "overlap" with warfarin for a total of 3 days combined low molecular weight heparin + warfarin. After these 3 days of overlapping warfarin with low molecular weight heparin you will take warfarin only as instructed.    Patient verbalized understanding of these instructions.

## 2013-09-18 ENCOUNTER — Encounter: Payer: Self-pay | Admitting: Internal Medicine

## 2013-09-27 ENCOUNTER — Encounter: Payer: Self-pay | Admitting: Internal Medicine

## 2013-09-27 DIAGNOSIS — Z0000 Encounter for general adult medical examination without abnormal findings: Secondary | ICD-10-CM | POA: Insufficient documentation

## 2013-09-27 DIAGNOSIS — Z7189 Other specified counseling: Secondary | ICD-10-CM | POA: Insufficient documentation

## 2013-10-09 ENCOUNTER — Ambulatory Visit (INDEPENDENT_AMBULATORY_CARE_PROVIDER_SITE_OTHER): Payer: Medicare Other | Admitting: Pharmacist

## 2013-10-09 DIAGNOSIS — I749 Embolism and thrombosis of unspecified artery: Secondary | ICD-10-CM

## 2013-10-09 DIAGNOSIS — Z7901 Long term (current) use of anticoagulants: Secondary | ICD-10-CM

## 2013-10-09 DIAGNOSIS — I8290 Acute embolism and thrombosis of unspecified vein: Secondary | ICD-10-CM

## 2013-10-09 LAB — POCT INR: INR: 2.6

## 2013-10-09 NOTE — Patient Instructions (Signed)
Patient instructed to take medications as defined in the Anti-coagulation Track section of this encounter.  Patient instructed to take today's dose.  Patient verbalized understanding of these instructions.    

## 2013-10-09 NOTE — Progress Notes (Signed)
Anti-Coagulation Progress Note  Pamela [name] is a 68 y.o. female who is currently on an anti-coagulation regimen.    RECENT RESULTS: Recent results are below, the most recent result is correlated with a dose of 18 mg. per week: Lab Results  Component Value Date   INR 2.60 10/09/2013   INR 3.90 09/11/2013   INR 4.20 08/28/2013    ANTI-COAG DOSE: Anticoagulation Dose Instructions as of 10/09/2013     [name] Tue Wed Thu Fri Sat   New Dose 2 mg 2 mg 4 mg 2 mg 2 mg 4 mg 2 mg       ANTICOAG SUMMARY: Anticoagulation Episode Summary   Current INR goal 2.0-3.0  Next INR check 11/06/2013  INR from last check 2.60 (10/09/2013)  Weekly max dose   Target end date Indefinite  [facility]  Preferred lab   Send INR reminders to ANTICOAG [facility]   Indications  VTE (venous thromboembolism) [453.9] DVT HX OF (Resolved) [V12.51] Long term current use of anticoagulant [V58.61]        Comments       Anticoagulation Care Providers   Provider Role Specialty Phone number   Burman Freestone, MD  Internal Medicine [phone]      ANTICOAG TODAY: Anticoagulation Summary as of 10/09/2013   INR goal 2.0-3.0  Selected INR 2.60 (10/09/2013)  Next INR check 11/06/2013  Target end date Indefinite   Indications  VTE (venous thromboembolism) [453.9] DVT HX OF (Resolved) [V12.51] Long term current use of anticoagulant [V58.61]      Anticoagulation Episode Summary   [facility] check [facility]   Preferred lab    Send INR reminders to ANTICOAG [facility]   Comments     Anticoagulation Care Providers   Provider Role Specialty Phone number   Burman Freestone, MD  Internal Medicine [phone]      PATIENT INSTRUCTIONS: Patient Instructions  Patient instructed to take medications as defined in the Anti-coagulation Track section of this encounter.  Patient instructed to take today's dose.  Patient verbalized understanding of these instructions.       FOLLOW-UP Return  in 4 weeks (on 11/06/2013) for Follow up INR at 1030h.  [name], III Pharm.D., CACP

## 2013-10-28 ENCOUNTER — Other Ambulatory Visit: Payer: Self-pay | Admitting: Internal Medicine

## 2013-11-06 ENCOUNTER — Ambulatory Visit (INDEPENDENT_AMBULATORY_CARE_PROVIDER_SITE_OTHER): Payer: Medicare Other | Admitting: Pharmacist

## 2013-11-06 DIAGNOSIS — I8290 Acute embolism and thrombosis of unspecified vein: Secondary | ICD-10-CM

## 2013-11-06 DIAGNOSIS — I749 Embolism and thrombosis of unspecified artery: Secondary | ICD-10-CM

## 2013-11-06 DIAGNOSIS — Z7901 Long term (current) use of anticoagulants: Secondary | ICD-10-CM

## 2013-11-06 LAB — POCT INR: INR: 3.6

## 2013-11-06 NOTE — Progress Notes (Signed)
Anti-Coagulation Progress Note  Pamela [name] is a 68 y.o. female who is currently on an anti-coagulation regimen.    RECENT RESULTS: Recent results are below, the most recent result is correlated with a dose of 18 mg. per week: Lab Results  Component Value Date   INR 3.60 11/06/2013   INR 2.60 10/09/2013   INR 3.90 09/11/2013    ANTI-COAG DOSE: Anticoagulation Dose Instructions as of 11/06/2013     [name] Tue Wed Thu Fri Sat   New Dose 2 mg 2 mg 2 mg 2 mg 2 mg 4 mg 2 mg       ANTICOAG SUMMARY: Anticoagulation Episode Summary   Current INR goal 2.0-3.0  Next INR check 12/11/2013  INR from last check 3.60! (11/06/2013)  Weekly max dose   Target end date Indefinite  [facility]  Preferred lab   Send INR reminders to ANT[facility]AG [facility]   Indications  VTE (venous thromboembolism) [453.9] DVT HX OF (Resolved) [V12.51] Long term current use of anticoagulant [V58.61]        Comments       Anticoagulation Care Providers   Provider Role Specialty Phone number   Burman Freestone, MD  Internal Medicine [phone]      ANTICOAG TODAY: Anticoagulation Summary as of 11/06/2013   INR goal 2.0-3.0  Selected INR 3.60! (11/06/2013)  Next INR check 12/11/2013  Target end date Indefinite   Indications  VTE (venous thromboembolism) [453.9] DVT HX OF (Resolved) [V12.51] Long term current use of anticoagulant [V58.61]      Anticoagulation Episode Summary   [facility] check [facility]   Preferred lab    Send INR reminders to ANT[facility]AG [facility]   Comments     Anticoagulation Care Providers   Provider Role Specialty Phone number   Burman Freestone, MD  Internal Medicine [phone]      PATIENT INSTRUCTIONS: Patient Instructions  Patient instructed to take medications as defined in the Anti-coagulation Track section of this encounter.  Patient instructed to take today's dose.  Patient verbalized understanding of these instructions.       FOLLOW-UP Return  in 5 weeks (on 12/11/2013) for Follow up INR at 1145h.  [name], III Pharm.D., CACP

## 2013-11-06 NOTE — Patient Instructions (Signed)
Patient instructed to take medications as defined in the Anti-coagulation Track section of this encounter.  Patient instructed to take today's dose.  Patient verbalized understanding of these instructions.    

## 2013-12-11 ENCOUNTER — Ambulatory Visit (INDEPENDENT_AMBULATORY_CARE_PROVIDER_SITE_OTHER): Payer: Medicare Other | Admitting: Pharmacist

## 2013-12-11 DIAGNOSIS — I749 Embolism and thrombosis of unspecified artery: Secondary | ICD-10-CM

## 2013-12-11 DIAGNOSIS — I8290 Acute embolism and thrombosis of unspecified vein: Secondary | ICD-10-CM

## 2013-12-11 DIAGNOSIS — Z7901 Long term (current) use of anticoagulants: Secondary | ICD-10-CM

## 2013-12-11 LAB — POCT INR: INR: 2.3

## 2013-12-11 NOTE — Progress Notes (Signed)
Anti-Coagulation Progress Note  Pamela [name] is a 68 y.o. female who is currently on an anti-coagulation regimen.    RECENT RESULTS: Recent results are below, the most recent result is correlated with a dose of 16 mg. per week: Lab Results  Component Value Date   INR 2.30 12/11/2013   INR 3.60 11/06/2013   INR 2.60 10/09/2013    ANTI-COAG DOSE: Anticoagulation Dose Instructions as of 12/11/2013     [name] Tue Wed Thu Fri Sat   New Dose 2 mg 2 mg 2 mg 2 mg 2 mg 4 mg 2 mg       ANTICOAG SUMMARY: Anticoagulation Episode Summary   Current INR goal 2.0-3.0  Next INR check 01/15/2014  INR from last check 2.30 (12/11/2013)  Weekly max dose   Target end date Indefinite  [facility]  Preferred lab   Send INR reminders to ANT[facility]AG [facility]   Indications  VTE (venous thromboembolism) [453.9] DVT HX OF (Resolved) [V12.51] Long term current use of anticoagulant [V58.61]        Comments       Anticoagulation Care Providers   Provider Role Specialty Phone number   Burman Freestone, MD  Internal Medicine [phone]      ANTICOAG TODAY: Anticoagulation Summary as of 12/11/2013   INR goal 2.0-3.0  Selected INR 2.30 (12/11/2013)  Next INR check 01/15/2014  Target end date Indefinite   Indications  VTE (venous thromboembolism) [453.9] DVT HX OF (Resolved) [V12.51] Long term current use of anticoagulant [V58.61]      Anticoagulation Episode Summary   [facility] check [facility]   Preferred lab    Send INR reminders to ANT[facility]   Comments     Anticoagulation Care Providers   Provider Role Specialty Phone number   Burman Freestone, MD  Internal Medicine [phone]      PATIENT INSTRUCTIONS: Patient Instructions  Patient instructed to take medications as defined in the Anti-coagulation Track section of this encounter.  Patient instructed to taketoday's dose.  Patient verbalized understanding of these instructions.       FOLLOW-UP Return  in 5 weeks (on 01/15/2014) for Follow up INR at 0900h.  [name], III Pharm.D., CACP

## 2013-12-11 NOTE — Patient Instructions (Signed)
Patient instructed to take medications as defined in the Anti-coagulation Track section of this encounter.  Patient instructed to take today's dose.  Patient verbalized understanding of these instructions.    

## 2014-01-15 ENCOUNTER — Ambulatory Visit (INDEPENDENT_AMBULATORY_CARE_PROVIDER_SITE_OTHER): Payer: Medicare Other | Admitting: Pharmacist

## 2014-01-15 DIAGNOSIS — I749 Embolism and thrombosis of unspecified artery: Secondary | ICD-10-CM

## 2014-01-15 DIAGNOSIS — Z7901 Long term (current) use of anticoagulants: Secondary | ICD-10-CM

## 2014-01-15 DIAGNOSIS — I8290 Acute embolism and thrombosis of unspecified vein: Secondary | ICD-10-CM

## 2014-01-15 LAB — POCT INR: INR: 2.3

## 2014-01-15 NOTE — Patient Instructions (Signed)
Patient instructed to take medications as defined in the Anti-coagulation Track section of this encounter.  Patient instructed to take today's dose.  Patient verbalized understanding of these instructions.    

## 2014-01-15 NOTE — Progress Notes (Signed)
Anti-Coagulation Progress Note  NISHI NEISWONGER is a 68 y.o. female who is currently on an anti-coagulation regimen.    RECENT RESULTS: Recent results are below, the most recent result is correlated with a dose of 16 mg. per week: Lab Results  Component Value Date   INR 2.30 01/15/2014   INR 2.30 12/11/2013   INR 3.60 11/06/2013    ANTI-COAG DOSE: Anticoagulation Dose Instructions as of 01/15/2014     [name] Tue Wed Thu Fri Sat   New Dose 2 mg 2 mg 2 mg 2 mg 2 mg 4 mg 2 mg       ANTICOAG SUMMARY: Anticoagulation Episode Summary   Current INR goal 2.0-3.0  Next INR check 02/12/2014  INR from last check 2.30 (01/15/2014)  Weekly max dose   Target end date Indefinite  [facility]  Preferred lab   Send INR reminders to ANT[facility]AG [facility]   Indications  VTE (venous thromboembolism) [453.9] DVT HX OF (Resolved) [V12.51] Long term current use of anticoagulant [V58.61]        Comments       Anticoagulation Care Providers   Provider Role Specialty Phone number   Burman Freestone, MD  Internal Medicine [phone]      ANTICOAG TODAY: Anticoagulation Summary as of 01/15/2014   INR goal 2.0-3.0  Selected INR 2.30 (01/15/2014)  Next INR check 02/12/2014  Target end date Indefinite   Indications  VTE (venous thromboembolism) [453.9] DVT HX OF (Resolved) [V12.51] Long term current use of anticoagulant [V58.61]      Anticoagulation Episode Summary   [facility] check location [facility]   Preferred lab    Send INR reminders to ANT[facility]AG [facility]   Comments     Anticoagulation Care Providers   Provider Role Specialty Phone number   Burman Freestone, MD  Internal Medicine [phone]      PATIENT INSTRUCTIONS: Patient Instructions  Patient instructed to take medications as defined in the Anti-coagulation Track section of this encounter.  Patient instructed to take today's dose.  Patient verbalized understanding of these instructions.        FOLLOW-UP Return in 4 weeks (on 02/12/2014) for Follow up INR at 0930h.  [name], III Pharm.D., CACP

## 2014-01-25 ENCOUNTER — Encounter: Payer: Self-pay | Admitting: Internal Medicine

## 2014-01-25 ENCOUNTER — Ambulatory Visit (INDEPENDENT_AMBULATORY_CARE_PROVIDER_SITE_OTHER): Payer: Medicare Other | Admitting: Internal Medicine

## 2014-01-25 VITALS — BP 135/87 | HR 68 | Temp 98.7°F | Wt 190.2 lb

## 2014-01-25 DIAGNOSIS — K9089 Other intestinal malabsorption: Secondary | ICD-10-CM

## 2014-01-25 DIAGNOSIS — Z0000 Encounter for general adult medical examination without abnormal findings: Secondary | ICD-10-CM

## 2014-01-25 DIAGNOSIS — I749 Embolism and thrombosis of unspecified artery: Secondary | ICD-10-CM

## 2014-01-25 DIAGNOSIS — I8290 Acute embolism and thrombosis of unspecified vein: Secondary | ICD-10-CM

## 2014-01-25 DIAGNOSIS — E039 Hypothyroidism, unspecified: Secondary | ICD-10-CM

## 2014-01-25 DIAGNOSIS — F411 Generalized anxiety disorder: Secondary | ICD-10-CM

## 2014-01-25 DIAGNOSIS — I10 Essential (primary) hypertension: Secondary | ICD-10-CM

## 2014-01-25 DIAGNOSIS — R4589 Other symptoms and signs involving emotional state: Secondary | ICD-10-CM

## 2014-01-25 DIAGNOSIS — K909 Intestinal malabsorption, unspecified: Secondary | ICD-10-CM

## 2014-01-25 DIAGNOSIS — K219 Gastro-esophageal reflux disease without esophagitis: Secondary | ICD-10-CM

## 2014-01-25 LAB — TSH: TSH: 0.089 u[IU]/mL — ABNORMAL LOW (ref 0.350–4.500)

## 2014-01-25 NOTE — Progress Notes (Signed)
   Subjective:    Patient ID: Pamela [name], female    DOB: 09-Apr-1946, 68 y.o.   MRN: [id number]  HPI  Please see the A&P for the status of the pt's chronic medical problems.   Review of Systems  Respiratory: Positive for cough.   Gastrointestinal: Positive for diarrhea. Negative for constipation.  Psychiatric/Behavioral: Negative for sleep disturbance and agitation.       Objective:   Physical Exam  Constitutional: She is oriented to person, place, and time. She appears well-developed and well-nourished. No distress.  HENT:  Head: Normocephalic and atraumatic.  Right Ear: External ear normal.  Left Ear: External ear normal.  Nose: Nose normal.  Eyes: Conjunctivae and EOM are normal.  Neck: No thyromegaly present.  Cardiovascular: Normal rate, regular rhythm and normal heart sounds.   Pulmonary/Chest: Effort normal and breath sounds normal.  Musculoskeletal: Normal range of motion. She exhibits no edema and no tenderness.  Lymphadenopathy:    She has no cervical adenopathy.  Neurological: She is alert and oriented to person, place, and time.  Skin: Skin is warm and dry. She is not diaphoretic.  Psychiatric: She has a normal mood and affect. Her behavior is normal. Judgment and thought content normal.          Assessment & Plan:

## 2014-01-25 NOTE — Assessment & Plan Note (Signed)
On warfarin indef. Sees Dr Elie Confer.

## 2014-01-25 NOTE — Assessment & Plan Note (Signed)
She had a repat DEXA and reports it was nl. I need to get the report. Takes Calcium when she remembers. Only occ dietary calcium. I rec daily calcium.   Takes Vit C and D and MVI when she remembers.

## 2014-01-25 NOTE — Assessment & Plan Note (Signed)
TSH always nl on her synthroid 100. Will check TSH today and cont dose.

## 2014-01-25 NOTE — Patient Instructions (Signed)
1. See me in 12 months 2. Call me if you need anything before then

## 2014-01-25 NOTE — Assessment & Plan Note (Signed)
Her only sxs is cough at night. On her PPI and only occ gets a nocturnal cough.

## 2014-01-25 NOTE — Assessment & Plan Note (Signed)
Was able to stop the benzo so will remove from Pl.

## 2014-01-25 NOTE — Assessment & Plan Note (Signed)
Since her colonoscopy this yr, her D has resolved. She is now not taking the Colestid but has it incase the D starts to flare. For now, may have D but just lasts one day.

## 2014-01-25 NOTE — Assessment & Plan Note (Signed)
Repeat BP good on HCTZ 12.5. Cont med.  BP Readings from Last 3 Encounters:  01/25/14 135/87  04/26/13 148/67  02/14/13 154/94

## 2014-01-26 ENCOUNTER — Encounter: Payer: Self-pay | Admitting: Internal Medicine

## 2014-01-26 ENCOUNTER — Other Ambulatory Visit: Payer: Self-pay | Admitting: Internal Medicine

## 2014-01-26 DIAGNOSIS — E039 Hypothyroidism, unspecified: Secondary | ICD-10-CM

## 2014-02-12 ENCOUNTER — Ambulatory Visit (INDEPENDENT_AMBULATORY_CARE_PROVIDER_SITE_OTHER): Payer: Medicare Other | Admitting: Pharmacist

## 2014-02-12 DIAGNOSIS — Z7901 Long term (current) use of anticoagulants: Secondary | ICD-10-CM

## 2014-02-12 DIAGNOSIS — I8290 Acute embolism and thrombosis of unspecified vein: Secondary | ICD-10-CM

## 2014-02-12 DIAGNOSIS — I749 Embolism and thrombosis of unspecified artery: Secondary | ICD-10-CM

## 2014-02-12 LAB — POCT INR: INR: 2.2

## 2014-02-12 NOTE — Progress Notes (Signed)
Anti-Coagulation Progress Note  Pamela [name] is a 68 y.o. female who is currently on an anti-coagulation regimen.    RECENT RESULTS: Recent results are below, the most recent result is correlated with a dose of 16 mg. per week: Lab Results  Component Value Date   INR 2.20 02/12/2014   INR 2.30 01/15/2014   INR 2.30 12/11/2013    ANTI-COAG DOSE: Anticoagulation Dose Instructions as of 02/12/2014     [name] Tue Wed Thu Fri Sat   New Dose 2 mg 4 mg 2 mg 2 mg 2 mg 4 mg 2 mg       ANTICOAG SUMMARY: Anticoagulation Episode Summary   Current INR goal 2.0-3.0  Next INR check 03/19/2014  INR from last check 2.20 (02/12/2014)  Weekly max dose   Target end date Indefinite  [facility]  Preferred lab   Send INR reminders to ANTICOAG [facility]   Indications  VTE (venous thromboembolism) [453.9] DVT HX OF (Resolved) [V12.51] Long term current use of anticoagulant [V58.61]        Comments       Anticoagulation Care Providers   Provider Role Specialty Phone number   Burman Freestone, MD  Internal Medicine [phone]      ANTICOAG TODAY: Anticoagulation Summary as of 02/12/2014   INR goal 2.0-3.0  Selected INR 2.20 (02/12/2014)  Next INR check 03/19/2014  Target end date Indefinite   Indications  VTE (venous thromboembolism) [453.9] DVT HX OF (Resolved) [V12.51] Long term current use of anticoagulant [V58.61]      Anticoagulation Episode Summary   [facility] check location [facility]   Preferred lab    Send INR reminders to ANTICOAG [facility]   Comments     Anticoagulation Care Providers   Provider Role Specialty Phone number   Burman Freestone, MD  Internal Medicine [phone]      PATIENT INSTRUCTIONS: Patient Instructions  Patient instructed to take medications as defined in the Anti-coagulation Track section of this encounter.  Patient instructed to take today's dose.  Patient verbalized understanding of these instructions.        FOLLOW-UP Return in 5 weeks (on 03/19/2014) for Follow up INR at 0945h.  [name], III Pharm.D., CACP

## 2014-02-12 NOTE — Patient Instructions (Signed)
Patient instructed to take medications as defined in the Anti-coagulation Track section of this encounter.  Patient instructed to take today's dose.  Patient verbalized understanding of these instructions.    

## 2014-02-21 ENCOUNTER — Other Ambulatory Visit: Payer: Self-pay | Admitting: Internal Medicine

## 2014-03-19 ENCOUNTER — Ambulatory Visit (INDEPENDENT_AMBULATORY_CARE_PROVIDER_SITE_OTHER): Payer: Medicare Other | Admitting: Pharmacist

## 2014-03-19 ENCOUNTER — Ambulatory Visit: Payer: Medicare Other

## 2014-03-19 ENCOUNTER — Other Ambulatory Visit (INDEPENDENT_AMBULATORY_CARE_PROVIDER_SITE_OTHER): Payer: Medicare Other

## 2014-03-19 DIAGNOSIS — E039 Hypothyroidism, unspecified: Secondary | ICD-10-CM

## 2014-03-19 DIAGNOSIS — Z23 Encounter for immunization: Secondary | ICD-10-CM

## 2014-03-19 DIAGNOSIS — I8290 Acute embolism and thrombosis of unspecified vein: Secondary | ICD-10-CM

## 2014-03-19 DIAGNOSIS — Z7901 Long term (current) use of anticoagulants: Secondary | ICD-10-CM

## 2014-03-19 DIAGNOSIS — I749 Embolism and thrombosis of unspecified artery: Secondary | ICD-10-CM

## 2014-03-19 LAB — POCT INR: INR: 3.6

## 2014-03-19 LAB — TSH: TSH: 0.179 u[IU]/mL — ABNORMAL LOW (ref 0.350–4.500)

## 2014-03-19 NOTE — Progress Notes (Signed)
Anti-Coagulation Progress Note  Pamela [name] is a 68 y.o. female who is currently on an anti-coagulation regimen.    RECENT RESULTS: Recent results are below, the most recent result is correlated with a dose of 18 mg. per week: Lab Results  Component Value Date   INR 3.60 03/19/2014   INR 2.20 02/12/2014   INR 2.30 01/15/2014    ANTI-COAG DOSE: Anticoagulation Dose Instructions as of 03/19/2014     [name] Tue Wed Thu Fri Sat   New Dose 2 mg 2 mg 2 mg 2 mg 2 mg 4 mg 2 mg       ANTICOAG SUMMARY: Anticoagulation Episode Summary   Current INR goal 2.0-3.0  Next INR check 04/23/2014  INR from last check 3.60! (03/19/2014)  Weekly max dose   Target end date Indefinite  [facility] check location [facility]  Preferred lab   Send INR reminders to ANT[facility]AG [facility]   Indications  VTE (venous thromboembolism) [453.9] DVT HX OF (Resolved) [V12.51] Long term current use of anticoagulant [V58.61]        Comments       Anticoagulation Care Providers   Provider Role Specialty Phone number   Burman Freestone, MD  Internal Medicine [phone]      ANTICOAG TODAY: Anticoagulation Summary as of 03/19/2014   INR goal 2.0-3.0  Selected INR 3.60! (03/19/2014)  Next INR check 04/23/2014  Target end date Indefinite   Indications  VTE (venous thromboembolism) [453.9] DVT HX OF (Resolved) [V12.51] Long term current use of anticoagulant [V58.61]      Anticoagulation Episode Summary   [facility] check location [facility]   Preferred lab    Send INR reminders to ANTICOAG [facility]   Comments     Anticoagulation Care Providers   Provider Role Specialty Phone number   Burman Freestone, MD  Internal Medicine [phone]      PATIENT INSTRUCTIONS: Patient Instructions  Patient instructed to take medications as defined in the Anti-coagulation Track section of this encounter.  Patient instructed to take today's dose.  Patient verbalized understanding of these instructions.        FOLLOW-UP Return in 5 weeks (on 04/23/2014) for Follow up INR at 0945h.  [name], III Pharm.D., CACP

## 2014-03-19 NOTE — Patient Instructions (Signed)
Patient instructed to take medications as defined in the Anti-coagulation Track section of this encounter.  Patient instructed to take today's dose.  Patient verbalized understanding of these instructions.    

## 2014-03-20 ENCOUNTER — Encounter: Payer: Self-pay | Admitting: Internal Medicine

## 2014-03-20 ENCOUNTER — Other Ambulatory Visit: Payer: Self-pay | Admitting: Internal Medicine

## 2014-03-20 DIAGNOSIS — E039 Hypothyroidism, unspecified: Secondary | ICD-10-CM

## 2014-03-20 MED ORDER — LEVOTHYROXINE SODIUM 88 MCG PO TABS: ORAL_TABLET | ORAL | Status: DC

## 2014-03-28 ENCOUNTER — Other Ambulatory Visit: Payer: Self-pay | Admitting: Internal Medicine

## 2014-04-23 ENCOUNTER — Ambulatory Visit (INDEPENDENT_AMBULATORY_CARE_PROVIDER_SITE_OTHER): Payer: Medicare Other | Admitting: Pharmacist

## 2014-04-23 ENCOUNTER — Other Ambulatory Visit (INDEPENDENT_AMBULATORY_CARE_PROVIDER_SITE_OTHER): Payer: Medicare Other

## 2014-04-23 DIAGNOSIS — E039 Hypothyroidism, unspecified: Secondary | ICD-10-CM

## 2014-04-23 DIAGNOSIS — Z7901 Long term (current) use of anticoagulants: Secondary | ICD-10-CM

## 2014-04-23 DIAGNOSIS — I8290 Acute embolism and thrombosis of unspecified vein: Secondary | ICD-10-CM

## 2014-04-23 LAB — POCT INR: INR: 2.9

## 2014-04-23 LAB — TSH: TSH: 0.341 u[IU]/mL — AB (ref 0.350–4.500)

## 2014-04-23 NOTE — Progress Notes (Signed)
Anti-Coagulation Progress Note  Pamela [name] is a 68 y.o. female who is currently on an anti-coagulation regimen.    RECENT RESULTS: Recent results are below, the most recent result is correlated with a dose of 16 mg. per week: Lab Results  Component Value Date   INR 2.90 04/23/2014   INR 3.60 03/19/2014   INR 2.20 02/12/2014    ANTI-COAG DOSE: Anticoagulation Dose Instructions as of 04/23/2014     [name] Tue Wed Thu Fri Sat   New Dose 2 mg 2 mg 2 mg 2 mg 2 mg 4 mg 2 mg       ANTICOAG SUMMARY: Anticoagulation Episode Summary   Current INR goal 2.0-3.0  Next INR check 05/21/2014  INR from last check 2.90 (04/23/2014)  Weekly max dose   Target end date Indefinite  [facility] check location [facility]  Preferred lab   Send INR reminders to ANTICOAG [facility]   Indications  VTE (venous thromboembolism) [I82.90] DVT HX OF (Resolved) [Z86.718] Long term current use of anticoagulant [Z79.01]        Comments       Anticoagulation Care Providers   Provider Role Specialty Phone number   Burman Freestone, MD  Internal Medicine [phone]      ANTICOAG TODAY: Anticoagulation Summary as of 04/23/2014   INR goal 2.0-3.0  Selected INR 2.90 (04/23/2014)  Next INR check 05/21/2014  Target end date Indefinite   Indications  VTE (venous thromboembolism) [I82.90] DVT HX OF (Resolved) [Z86.718] Long term current use of anticoagulant [Z79.01]      Anticoagulation Episode Summary   [facility] check location [facility]   Preferred lab    Send INR reminders to ANTICOAG [facility]   Comments     Anticoagulation Care Providers   Provider Role Specialty Phone number   Burman Freestone, MD  Internal Medicine [phone]      PATIENT INSTRUCTIONS: Patient Instructions  Patient instructed to take medications as defined in the Anti-coagulation Track section of this encounter.  Patient instructed to take today's dose.  Patient verbalized understanding of these instructions.        FOLLOW-UP Return in 4 weeks (on 05/21/2014) for Follow up INR at 1000h.  [name], III Pharm.D., CACP

## 2014-04-23 NOTE — Progress Notes (Signed)
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 Duration: Lifelong. INR: At target. Agree with Dr. Gladstone Pih assessment and plan.

## 2014-04-23 NOTE — Patient Instructions (Signed)
Patient instructed to take medications as defined in the Anti-coagulation Track section of this encounter.  Patient instructed to take today's dose.  Patient verbalized understanding of these instructions.    

## 2014-04-24 ENCOUNTER — Encounter: Payer: Self-pay | Admitting: Internal Medicine

## 2014-04-24 ENCOUNTER — Other Ambulatory Visit: Payer: Self-pay | Admitting: Internal Medicine

## 2014-04-24 DIAGNOSIS — E039 Hypothyroidism, unspecified: Secondary | ICD-10-CM

## 2014-05-21 ENCOUNTER — Ambulatory Visit (INDEPENDENT_AMBULATORY_CARE_PROVIDER_SITE_OTHER): Payer: Medicare Other | Admitting: Pharmacist

## 2014-05-21 DIAGNOSIS — Z7901 Long term (current) use of anticoagulants: Secondary | ICD-10-CM

## 2014-05-21 DIAGNOSIS — I8290 Acute embolism and thrombosis of unspecified vein: Secondary | ICD-10-CM

## 2014-05-21 LAB — POCT INR: INR: 2.6

## 2014-05-21 NOTE — Progress Notes (Signed)
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 Duration: Lifelong. INR: At target. Agree with Dr. Gladstone Pih assessment and plan.

## 2014-05-21 NOTE — Progress Notes (Signed)
Anti-Coagulation Progress Note  Pamela [name] is a 68 y.o. female who is currently on an anti-coagulation regimen.    RECENT RESULTS: Recent results are below, the most recent result is correlated with a dose of 16 mg. per week: Lab Results  Component Value Date   INR 2.60 05/21/2014   INR 2.90 04/23/2014   INR 3.60 03/19/2014    ANTI-COAG DOSE: Anticoagulation Dose Instructions as of 05/21/2014      [name] Tue Wed Thu Fri Sat   New Dose 2 mg 2 mg 2 mg 2 mg 2 mg 4 mg 2 mg       ANTICOAG SUMMARY: Anticoagulation Episode Summary    Current INR goal 2.0-3.0  Next INR check 06/18/2014  INR from last check 2.60 (05/21/2014)  Weekly max dose   Target end date Indefinite  [facility]  Preferred lab   Send INR reminders to ANTICOAG [facility]   Indications  VTE (venous thromboembolism) [I82.90] DVT HX OF (Resolved) [Z86.718] Long term current use of anticoagulant [Z79.01]        Comments       Anticoagulation Care Providers    Provider Role Specialty Phone number   Burman Freestone, MD  Internal Medicine [phone]      ANTICOAG TODAY: Anticoagulation Summary as of 05/21/2014    INR goal 2.0-3.0  Selected INR 2.60 (05/21/2014)  Next INR check 06/18/2014  Target end date Indefinite   Indications  VTE (venous thromboembolism) [I82.90] DVT HX OF (Resolved) [Z86.718] Long term current use of anticoagulant [Z79.01]      Anticoagulation Episode Summary    [facility] check location [facility]   Preferred lab    Send INR reminders to ANTICOAG [facility]   Comments     Anticoagulation Care Providers    Provider Role Specialty Phone number   Burman Freestone, MD  Internal Medicine [phone]      PATIENT INSTRUCTIONS: Patient Instructions  Patient instructed to take medications as defined in the Anti-coagulation Track section of this encounter.  Patient instructed to take today's dose.  Patient verbalized understanding of these instructions.       FOLLOW-UP Return in 4 weeks (on 06/18/2014) for Follow up INR at 1000h.  [name], III Pharm.D., CACP

## 2014-05-21 NOTE — Patient Instructions (Signed)
Patient instructed to take medications as defined in the Anti-coagulation Track section of this encounter.  Patient instructed to take today's dose.  Patient verbalized understanding of these instructions.    

## 2014-06-18 ENCOUNTER — Ambulatory Visit (INDEPENDENT_AMBULATORY_CARE_PROVIDER_SITE_OTHER): Payer: Medicare Other | Admitting: Pharmacist

## 2014-06-18 DIAGNOSIS — I8290 Acute embolism and thrombosis of unspecified vein: Secondary | ICD-10-CM

## 2014-06-18 DIAGNOSIS — Z7901 Long term (current) use of anticoagulants: Secondary | ICD-10-CM

## 2014-06-18 LAB — POCT INR: INR: 3.7

## 2014-06-18 NOTE — Patient Instructions (Signed)
Patient instructed to take medications as defined in the Anti-coagulation Track section of this encounter.  Patient instructed to OMIT today's dose.  Patient verbalized understanding of these instructions.    

## 2014-06-18 NOTE — Progress Notes (Signed)
Anti-Coagulation Progress Note  Pamela [name] is a 68 y.o. female who is currently on an anti-coagulation regimen.    RECENT RESULTS: Recent results are below, the most recent result is correlated with a dose of 16 mg. per week: Lab Results  Component Value Date   INR 3.70 06/18/2014   INR 2.60 05/21/2014   INR 2.90 04/23/2014    ANTI-COAG DOSE: Anticoagulation Dose Instructions as of 06/18/2014      [name] Tue Wed Thu Fri Sat   New Dose 2 mg 2 mg 2 mg 2 mg 2 mg 2 mg 2 mg    Description        OMIT today's dose.        ANTICOAG SUMMARY: Anticoagulation Episode Summary    Current INR goal 2.0-3.0  Next INR check 07/09/2014  INR from last check 3.70! (06/18/2014)  Weekly max dose   Target end date Indefinite  [facility] check location [facility]  Preferred lab   Send INR reminders to ANTICOAG [facility]   Indications  VTE (venous thromboembolism) [I82.90] DVT HX OF (Resolved) [Z86.718] Long term current use of anticoagulant [Z79.01]        Comments       Anticoagulation Care Providers    Provider Role Specialty Phone number   Burman Freestone, MD  Internal Medicine [phone]      ANTICOAG TODAY: Anticoagulation Summary as of 06/18/2014    INR goal 2.0-3.0  Selected INR 3.70! (06/18/2014)  Next INR check 07/09/2014  Target end date Indefinite   Indications  VTE (venous thromboembolism) [I82.90] DVT HX OF (Resolved) [Z86.718] Long term current use of anticoagulant [Z79.01]      Anticoagulation Episode Summary    [facility] check location [facility]   Preferred lab    Send INR reminders to ANTICOAG [facility]   Comments     Anticoagulation Care Providers    Provider Role Specialty Phone number   Burman Freestone, MD  Internal Medicine [phone]      PATIENT INSTRUCTIONS: Patient Instructions  Patient instructed to take medications as defined in the Anti-coagulation Track section of this encounter.  Patient instructed to OMIT today's dose.  Patient  verbalized understanding of these instructions.       FOLLOW-UP Return in 3 weeks (on 07/09/2014) for Follow up INR at 1000h.  [name], III Pharm.D., CACP

## 2014-06-19 NOTE — Progress Notes (Signed)
Patient on anticoagulation for recurrent DVT.  INR 3.7, coumadin decreased.  I have reviewed Dr. Gladstone Pih note.

## 2014-06-28 ENCOUNTER — Other Ambulatory Visit: Payer: Self-pay | Admitting: Internal Medicine

## 2014-07-09 ENCOUNTER — Ambulatory Visit (INDEPENDENT_AMBULATORY_CARE_PROVIDER_SITE_OTHER): Payer: Medicare Other | Admitting: Pharmacist

## 2014-07-09 DIAGNOSIS — I8290 Acute embolism and thrombosis of unspecified vein: Secondary | ICD-10-CM

## 2014-07-09 DIAGNOSIS — Z7901 Long term (current) use of anticoagulants: Secondary | ICD-10-CM

## 2014-07-09 LAB — POCT INR: INR: 1.9

## 2014-07-09 NOTE — Progress Notes (Signed)
Anti-Coagulation Progress Note  Pamela [name] is a 69 y.o. female who is currently on an anti-coagulation regimen.    RECENT RESULTS: Recent results are below, the most recent result is correlated with a dose of 14 mg. per week: Lab Results  Component Value Date   INR 1.90 07/09/2014   INR 3.70 06/18/2014   INR 2.60 05/21/2014    ANTI-COAG DOSE: Anticoagulation Dose Instructions as of 07/09/2014      [name] Tue Wed Thu Fri Sat   New Dose 2 mg 2 mg 4 mg 2 mg 2 mg 2 mg 2 mg       ANTICOAG SUMMARY: Anticoagulation Episode Summary    Current INR goal 2.0-3.0  Next INR check 08/06/2014  INR from last check 1.90! (07/09/2014)  Weekly max dose   Target end date Indefinite  [facility]R check location [facility]  Preferred lab   Send INR reminders to ANTICOAG [facility]   Indications  VTE (venous thromboembolism) [I82.90] DVT HX OF (Resolved) [Z86.718] Long term current use of anticoagulant [Z79.01]        Comments       Anticoagulation Care Providers    Provider Role Specialty Phone number   Burman Freestone, MD  Internal Medicine [phone]      ANTICOAG TODAY: Anticoagulation Summary as of 07/09/2014    INR goal 2.0-3.0  Selected INR 1.90! (07/09/2014)  Next INR check 08/06/2014  Target end date Indefinite   Indications  VTE (venous thromboembolism) [I82.90] DVT HX OF (Resolved) [Z86.718] Long term current use of anticoagulant [Z79.01]      Anticoagulation Episode Summary    [facility] check location [facility]   Preferred lab    Send INR reminders to ANTICOAG [facility]   Comments     Anticoagulation Care Providers    Provider Role Specialty Phone number   Burman Freestone, MD  Internal Medicine [phone]      PATIENT INSTRUCTIONS: Patient Instructions  Patient instructed to take medications as defined in the Anti-coagulation Track section of this encounter.  Patient instructed to take today's dose.  Patient verbalized understanding of these instructions.        FOLLOW-UP Return in 4 weeks (on 08/06/2014) for Follow up INR at 1015h.  [name], III Pharm.D., CACP

## 2014-07-09 NOTE — Patient Instructions (Signed)
Patient instructed to take medications as defined in the Anti-coagulation Track section of this encounter.  Patient instructed to take today's dose.  Patient verbalized understanding of these instructions.    

## 2014-08-06 ENCOUNTER — Ambulatory Visit (INDEPENDENT_AMBULATORY_CARE_PROVIDER_SITE_OTHER): Payer: Medicare Other | Admitting: Pharmacist

## 2014-08-06 DIAGNOSIS — Z7901 Long term (current) use of anticoagulants: Secondary | ICD-10-CM

## 2014-08-06 DIAGNOSIS — I8290 Acute embolism and thrombosis of unspecified vein: Secondary | ICD-10-CM

## 2014-08-06 LAB — POCT INR: INR: 2.2

## 2014-08-06 NOTE — Patient Instructions (Signed)
Patient instructed to take medications as defined in the Anti-coagulation Track section of this encounter.  Patient instructed to take today's dose.  Patient verbalized understanding of these instructions.    

## 2014-08-06 NOTE — Progress Notes (Signed)
Anti-Coagulation Progress Note  Pamela [name] is a 69 y.o. female who is currently on an anti-coagulation regimen.    RECENT RESULTS: Recent results are below, the most recent result is correlated with a dose of 16 mg. per week: Lab Results  Component Value Date   INR 2.20 08/06/2014   INR 1.90 07/09/2014   INR 3.70 06/18/2014    ANTI-COAG DOSE: Anticoagulation Dose Instructions as of 08/06/2014      [name] Tue Wed Thu Fri Sat   New Dose 2 mg 4 mg 2 mg 2 mg 4 mg 2 mg 2 mg       ANTICOAG SUMMARY: Anticoagulation Episode Summary    Current INR goal 2.0-3.0  Next INR check 09/03/2014  INR from last check 2.20 (08/06/2014)  Weekly max dose   Target end date Indefinite  [facility] check location [facility]  Preferred lab   Send INR reminders to ANTICOAG [facility]   Indications  VTE (venous thromboembolism) [I82.90] DVT HX OF (Resolved) [Z86.718] Long term current use of anticoagulant [Z79.01]        Comments       Anticoagulation Care Providers    Provider Role Specialty Phone number   Burman Freestone, MD  Internal Medicine [phone]      ANTICOAG TODAY: Anticoagulation Summary as of 08/06/2014    INR goal 2.0-3.0  Selected INR 2.20 (08/06/2014)  Next INR check 09/03/2014  Target end date Indefinite   Indications  VTE (venous thromboembolism) [I82.90] DVT HX OF (Resolved) [Z86.718] Long term current use of anticoagulant [Z79.01]      Anticoagulation Episode Summary    [facility] check location [facility]   Preferred lab    Send INR reminders to ANTICOAG [facility]   Comments     Anticoagulation Care Providers    Provider Role Specialty Phone number   Burman Freestone, MD  Internal Medicine [phone]      PATIENT INSTRUCTIONS: Patient Instructions  Patient instructed to take medications as defined in the Anti-coagulation Track section of this encounter.  Patient instructed to take today's dose.  Patient verbalized understanding of these instructions.        FOLLOW-UP Return in 4 weeks (on 09/03/2014) for Follow up INR at 1045h.  [name], III Pharm.D., CACP

## 2014-09-03 ENCOUNTER — Ambulatory Visit (INDEPENDENT_AMBULATORY_CARE_PROVIDER_SITE_OTHER): Payer: Medicare Other | Admitting: Pharmacist

## 2014-09-03 DIAGNOSIS — Z7901 Long term (current) use of anticoagulants: Secondary | ICD-10-CM

## 2014-09-03 DIAGNOSIS — I8290 Acute embolism and thrombosis of unspecified vein: Secondary | ICD-10-CM

## 2014-09-03 LAB — POCT INR: INR: 2.2

## 2014-09-03 NOTE — Progress Notes (Signed)
Anti-Coagulation Progress Note  Pamela [name] is a 69 y.o. female who is currently on an anti-coagulation regimen.    RECENT RESULTS: Recent results are below, the most recent result is correlated with a dose of 22.5 mg. per week: Lab Results  Component Value Date   INR 2.20 09/03/2014   INR 2.20 08/06/2014   INR 1.90 07/09/2014    ANTI-COAG DOSE: Anticoagulation Dose Instructions as of 09/03/2014      [name] Tue Wed Thu Fri Sat   New Dose 2 mg 4 mg 2 mg 2 mg 4 mg 2 mg 2 mg       ANTICOAG SUMMARY: Anticoagulation Episode Summary    Current INR goal 2.0-3.0  Next INR check 10/08/2014  INR from last check 2.20 (09/03/2014)  Weekly max dose   Target end date Indefinite  [facility] check [facility]  Preferred lab   Send INR reminders to ANTICOAG [facility]   Indications  VTE (venous thromboembolism) [I82.90] DVT HX OF (Resolved) [Z86.718] Long term current use of anticoagulant [Z79.01]        Comments       Anticoagulation Care Providers    Provider Role Specialty Phone number   Burman Freestone, MD  Internal Medicine [phone]      ANTICOAG TODAY: Anticoagulation Summary as of 09/03/2014    INR goal 2.0-3.0  Selected INR 2.20 (09/03/2014)  Next INR check 10/08/2014  Target end date Indefinite   Indications  VTE (venous thromboembolism) [I82.90] DVT HX OF (Resolved) [Z86.718] Long term current use of anticoagulant [Z79.01]      Anticoagulation Episode Summary    [facility] check location [facility]   Preferred lab    Send INR reminders to ANTICOAG [facility]   Comments     Anticoagulation Care Providers    Provider Role Specialty Phone number   Burman Freestone, MD  Internal Medicine [phone]      PATIENT INSTRUCTIONS: Patient Instructions  Patient instructed to take medications as defined in the Anti-coagulation Track section of this encounter.  Patient instructed to take today's dose.  Patient verbalized understanding of these instructions.        FOLLOW-UP Return in 5 weeks (on 10/08/2014) for Follow up INR at 1100h.  [name], III Pharm.D., CACP

## 2014-09-03 NOTE — Patient Instructions (Signed)
Patient instructed to take medications as defined in the Anti-coagulation Track section of this encounter.  Patient instructed to take today's dose.  Patient verbalized understanding of these instructions.    

## 2014-09-03 NOTE — Progress Notes (Signed)
INTERNAL MEDICINE TEACHING ATTENDING ADDENDUM - Aldine Contes M.D  Duration- indefinite, Indication- recurrent DVT, INR- therapeutic. Agree with Dr. Gladstone Pih recommendations as outlined in his note.

## 2014-09-13 NOTE — Addendum Note (Signed)
Addended by: Orson Gear on: 09/13/2014 03:20 PM   Modules accepted: Orders

## 2014-09-26 ENCOUNTER — Other Ambulatory Visit: Payer: Self-pay | Admitting: Internal Medicine

## 2014-10-05 ENCOUNTER — Telehealth: Payer: Self-pay | Admitting: Pharmacist

## 2014-10-05 NOTE — Telephone Encounter (Signed)
Call to patient to confirm appointment for 10/08/14 at 11:00 lmtcb.

## 2014-10-08 ENCOUNTER — Ambulatory Visit (INDEPENDENT_AMBULATORY_CARE_PROVIDER_SITE_OTHER): Payer: Medicare Other | Admitting: Pharmacist

## 2014-10-08 DIAGNOSIS — I8290 Acute embolism and thrombosis of unspecified vein: Secondary | ICD-10-CM

## 2014-10-08 DIAGNOSIS — Z7901 Long term (current) use of anticoagulants: Secondary | ICD-10-CM

## 2014-10-08 DIAGNOSIS — Z86718 Personal history of other venous thrombosis and embolism: Secondary | ICD-10-CM

## 2014-10-08 LAB — POCT INR: INR: 2.7

## 2014-10-08 NOTE — Progress Notes (Signed)
Anti-Coagulation Progress Note  Pamela [name] is a 69 y.o. female who is currently on an anti-coagulation regimen.    RECENT RESULTS: Recent results are below, the most recent result is correlated with a dose of 18 mg. per week: Lab Results  Component Value Date   INR 2.70 10/08/2014   INR 2.20 09/03/2014   INR 2.20 08/06/2014    ANTI-COAG DOSE: Anticoagulation Dose Instructions as of 10/08/2014      [name] Tue Wed Thu Fri Sat   New Dose 2 mg 4 mg 2 mg 2 mg 4 mg 2 mg 2 mg       ANTICOAG SUMMARY: Anticoagulation Episode Summary    Current INR goal 2.0-3.0  Next INR check 11/05/2014  INR from last check 2.70 (10/08/2014)  Weekly max dose   Target end date Indefinite  [facility] check location [facility]  Preferred lab   Send INR reminders to ANT[facility]AG [facility]   Indications  VTE (venous thromboembolism) [I82.90] DVT HX OF (Resolved) [Z86.718] Long term current use of anticoagulant [Z79.01]        Comments       Anticoagulation Care Providers    Provider Role Specialty Phone number   Burman Freestone, MD  Internal Medicine [phone]      ANTICOAG TODAY: Anticoagulation Summary as of 10/08/2014    INR goal 2.0-3.0  Selected INR 2.70 (10/08/2014)  Next INR check 11/05/2014  Target end date Indefinite   Indications  VTE (venous thromboembolism) [I82.90] DVT HX OF (Resolved) [Z86.718] Long term current use of anticoagulant [Z79.01]      Anticoagulation Episode Summary    [facility]R check location [facility]   Preferred lab    Send INR reminders to ANTICOAG [facility]   Comments     Anticoagulation Care Providers    Provider Role Specialty Phone number   Burman Freestone, MD  Internal Medicine [phone]      PATIENT INSTRUCTIONS: Patient Instructions  Patient instructed to take medications as defined in the Anti-coagulation Track section of this encounter.  Patient instructed to take today's dose.  Patient verbalized understanding of these instructions.        FOLLOW-UP Return in 4 weeks (on 11/05/2014) for Follow up INR at 0930h.  [name], III Pharm.D., CACP

## 2014-10-08 NOTE — Patient Instructions (Signed)
Patient instructed to take medications as defined in the Anti-coagulation Track section of this encounter.  Patient instructed to take today's dose.  Patient verbalized understanding of these instructions.    

## 2014-10-10 NOTE — Progress Notes (Signed)
I have reviewed Dr. Gladstone Pih note.  Pamela [name] is on anticoagulation for VTE.

## 2014-11-05 ENCOUNTER — Ambulatory Visit (INDEPENDENT_AMBULATORY_CARE_PROVIDER_SITE_OTHER): Payer: Medicare Other | Admitting: Pharmacist

## 2014-11-05 DIAGNOSIS — I8290 Acute embolism and thrombosis of unspecified vein: Secondary | ICD-10-CM

## 2014-11-05 DIAGNOSIS — Z7901 Long term (current) use of anticoagulants: Secondary | ICD-10-CM | POA: Diagnosis not present

## 2014-11-05 LAB — POCT INR: INR: 1.7

## 2014-11-05 NOTE — Progress Notes (Signed)
INTERNAL MEDICINE TEACHING ATTENDING ADDENDUM - Aldine Contes M.D  Duration- indefinite, Indication- VTE, INR- sub therapeutic. Agree with Dr. Gladstone Pih recommendations as outlined in his note.

## 2014-11-05 NOTE — Patient Instructions (Signed)
Patient instructed to take medications as defined in the Anti-coagulation Track section of this encounter.  Patient instructed to take today's dose.  Patient verbalized understanding of these instructions.    

## 2014-11-05 NOTE — Progress Notes (Signed)
Anti-Coagulation Progress Note  Pamela [name] is a 69 y.o. female who is currently on an anti-coagulation regimen.    RECENT RESULTS: Recent results are below, the most recent result is correlated with a dose of 18 mg. per week: Lab Results  Component Value Date   INR 1.70 11/05/2014   INR 2.70 10/08/2014   INR 2.20 09/03/2014    ANTI-COAG DOSE: Anticoagulation Dose Instructions as of 11/05/2014      [name] Tue Wed Thu Fri Sat   New Dose 2 mg 4 mg 4 mg 2 mg 4 mg 2 mg 2 mg       ANTICOAG SUMMARY: Anticoagulation Episode Summary    Current INR goal 2.0-3.0  Next INR check 11/26/2014  INR from last check 1.70! (11/05/2014)  Weekly max dose   Target end date Indefinite  [facility] check location [facility]  Preferred lab   Send INR reminders to ANTICOAG [facility]   Indications  VTE (venous thromboembolism) [I82.90] DVT HX OF (Resolved) [Z86.718] Long term current use of anticoagulant [Z79.01]        Comments       Anticoagulation Care Providers    Provider Role Specialty Phone number   Burman Freestone, MD  Internal Medicine [phone]      ANTICOAG TODAY: Anticoagulation Summary as of 11/05/2014    INR goal 2.0-3.0  Selected INR 1.70! (11/05/2014)  Next INR check 11/26/2014  Target end date Indefinite   Indications  VTE (venous thromboembolism) [I82.90] DVT HX OF (Resolved) [Z86.718] Long term current use of anticoagulant [Z79.01]      Anticoagulation Episode Summary    [facility]R check location [facility]   Preferred lab    Send INR reminders to ANTICOAG [facility]   Comments     Anticoagulation Care Providers    Provider Role Specialty Phone number   Burman Freestone, MD  Internal Medicine [phone]      PATIENT INSTRUCTIONS: Patient Instructions  Patient instructed to take medications as defined in the Anti-coagulation Track section of this encounter.  Patient instructed to take today's dose.  Patient verbalized understanding of these instructions.        FOLLOW-UP Return in 3 weeks (on 11/26/2014) for Follow up INR at 0900h.  [name], III Pharm.D., CACP

## 2014-11-22 ENCOUNTER — Telehealth: Payer: Self-pay | Admitting: Pharmacist

## 2014-11-22 NOTE — Telephone Encounter (Signed)
Call to patient to confirm appointment for 11/26/14 at 10:00 lmtcb

## 2014-11-26 ENCOUNTER — Ambulatory Visit (INDEPENDENT_AMBULATORY_CARE_PROVIDER_SITE_OTHER): Payer: Medicare Other | Admitting: Pharmacist

## 2014-11-26 DIAGNOSIS — Z7901 Long term (current) use of anticoagulants: Secondary | ICD-10-CM | POA: Diagnosis not present

## 2014-11-26 DIAGNOSIS — Z86718 Personal history of other venous thrombosis and embolism: Secondary | ICD-10-CM

## 2014-11-26 DIAGNOSIS — I8290 Acute embolism and thrombosis of unspecified vein: Secondary | ICD-10-CM

## 2014-11-26 LAB — POCT INR: INR: 2.5

## 2014-11-26 NOTE — Patient Instructions (Signed)
Patient educated about medication as defined in this encounter and verbalized understanding by repeating back instructions provided.   

## 2014-11-26 NOTE — Progress Notes (Signed)
CLINICAL PHARMACIST ANTICOAG NOTE Pamela [name] is a 69 y.o. female who reports to the clinic for monitoring of warfarin treatment.    Indication: History of VTE (DVT) Duration: indefinite  Anticoagulation Clinic Visit History: Anticoagulation Episode Summary    Current INR goal 2.0-3.0  Next INR check 12/24/2014  INR from last check 2.5 (11/26/2014)  Weekly max dose   Target end date Indefinite  [facility] check location [facility]  Preferred lab   Send INR reminders to ANTICOAG [facility]   Indications  VTE (venous thromboembolism) [I82.90] DVT HX OF (Resolved) [Z86.718] Long term current use of anticoagulant [Z79.01]        Comments       Anticoagulation Care Providers    Provider Role Specialty Phone number   Burman Freestone, MD  Internal Medicine [phone]     ASSESSMENT Recent Results: Recent results are below, the most recent result is correlated with a dose of 20 mg per week: Lab Results  Component Value Date   INR 2.5 11/26/2014   INR 1.70 11/05/2014   INR 2.70 10/08/2014    INR today: Therapeutic  Anticoagulation Dosing: INR as of 11/26/2014 and Previous Dosing Information    INR Dt INR Goal [employer] Sun Mon Tue Wed Thu Fri Sat   11/26/2014 2.5 2.0-3.0 20 mg 2 mg 4 mg 4 mg 2 mg 4 mg 2 mg 2 mg    Anticoagulation Dose Instructions as of 11/26/2014      Total Sun Mon Tue Wed Thu Fri Sat   New Dose 20 mg 2 mg 4 mg 4 mg 2 mg 4 mg 2 mg 2 mg     (4 mg x 0.5)  (4 mg x 1)  (4 mg x 1)  (4 mg x 0.5)  (4 mg x 1)  (4 mg x 0.5)  (4 mg x 0.5)                           PLAN Weekly dose was unchanged.  Patient Instructions  Patient educated about medication as defined in this encounter and verbalized understanding by repeating back instructions provided.    Follow-up Return in 4 weeks (on 12/24/2014) for follow-up INR on 12/24/2014 @ 10:00 AM.   Pamela [name]  PharmD Candidate  Pamela [name] Clinical Pharmacist  15 minutes spent face-to-face with the  patient during the encounter. 50% of time spent on education. 50% of time was spent on testing and assessment.

## 2014-12-05 ENCOUNTER — Other Ambulatory Visit: Payer: Self-pay | Admitting: Internal Medicine

## 2014-12-12 ENCOUNTER — Encounter: Payer: Self-pay | Admitting: Internal Medicine

## 2014-12-13 ENCOUNTER — Ambulatory Visit (INDEPENDENT_AMBULATORY_CARE_PROVIDER_SITE_OTHER): Payer: Medicare Other | Admitting: Internal Medicine

## 2014-12-13 ENCOUNTER — Encounter: Payer: Self-pay | Admitting: Internal Medicine

## 2014-12-13 VITALS — BP 129/70 | HR 74 | Temp 98.2°F | Wt 193.0 lb

## 2014-12-13 DIAGNOSIS — E039 Hypothyroidism, unspecified: Secondary | ICD-10-CM

## 2014-12-13 DIAGNOSIS — Z7901 Long term (current) use of anticoagulants: Secondary | ICD-10-CM

## 2014-12-13 DIAGNOSIS — Z23 Encounter for immunization: Secondary | ICD-10-CM | POA: Diagnosis not present

## 2014-12-13 DIAGNOSIS — K219 Gastro-esophageal reflux disease without esophagitis: Secondary | ICD-10-CM

## 2014-12-13 DIAGNOSIS — I10 Essential (primary) hypertension: Secondary | ICD-10-CM | POA: Diagnosis not present

## 2014-12-13 DIAGNOSIS — M8580 Other specified disorders of bone density and structure, unspecified site: Secondary | ICD-10-CM

## 2014-12-13 DIAGNOSIS — I8290 Acute embolism and thrombosis of unspecified vein: Secondary | ICD-10-CM

## 2014-12-13 DIAGNOSIS — Z0000 Encounter for general adult medical examination without abnormal findings: Secondary | ICD-10-CM

## 2014-12-13 DIAGNOSIS — K9089 Other intestinal malabsorption: Secondary | ICD-10-CM

## 2014-12-13 DIAGNOSIS — K909 Intestinal malabsorption, unspecified: Secondary | ICD-10-CM

## 2014-12-13 DIAGNOSIS — E669 Obesity, unspecified: Secondary | ICD-10-CM

## 2014-12-13 DIAGNOSIS — I8291 Chronic embolism and thrombosis of unspecified vein: Secondary | ICD-10-CM

## 2014-12-13 DIAGNOSIS — R197 Diarrhea, unspecified: Secondary | ICD-10-CM

## 2014-12-13 LAB — BASIC METABOLIC PANEL WITH GFR
BUN: 17 mg/dL (ref 6–23)
CO2: 28 mEq/L (ref 19–32)
Calcium: 9.7 mg/dL (ref 8.4–10.5)
Chloride: 98 mEq/L (ref 96–112)
Creat: 0.78 mg/dL (ref 0.50–1.10)
GFR, EST AFRICAN AMERICAN: 89 mL/min
GFR, Est Non African American: 77 mL/min
GLUCOSE: 91 mg/dL (ref 70–99)
Potassium: 4.4 mEq/L (ref 3.5–5.3)
SODIUM: 138 meq/L (ref 135–145)

## 2014-12-13 NOTE — Patient Instructions (Signed)
I will send you your lab results Try to incorporate weight bearing exercises to help your bones See me in 1 year

## 2014-12-13 NOTE — Assessment & Plan Note (Signed)
Prevnar today.

## 2014-12-13 NOTE — Assessment & Plan Note (Signed)
I rec weight bearing exercises to help weight and osteopenia.

## 2014-12-13 NOTE — Assessment & Plan Note (Signed)
On calcium, Vit D, and Vit C but doses unknown. Rec weight bearing exercises. Currently, limited to walking for ADL's and grocery store type things.

## 2014-12-13 NOTE — Assessment & Plan Note (Signed)
Cont to see Dr Elie Confer and cont her warfarin. Lifelong for recurrent VTE.

## 2014-12-13 NOTE — Assessment & Plan Note (Addendum)
On HCTZ 12.5 and BP is well controlled. Checks at home occ and all <150/90. BP is lower in office than at home.  Cont HCTZ and check BMP

## 2014-12-13 NOTE — Assessment & Plan Note (Signed)
Off Colestid but has for PRN. Stools always soft / loose but manageable. Occ D. Doesn't interfere with life.

## 2014-12-13 NOTE — Assessment & Plan Note (Signed)
On 88 mcg. Last TSH 0.341 Oct. Only sxs if tires easily. Thinks the 88 is a good dose. We had had to decrease dose down to 88 2/2 low TSH. Check TSH today.

## 2014-12-13 NOTE — Progress Notes (Signed)
   Subjective:    Patient ID: Pamela [name], female    DOB: 10-Nov-1945, 69 y.o.   MRN: [id number]  HPI  Pamela [name] is here for HTN F/U. Please see the A&P for the status of the pt's chronic medical problems.   Review of Systems  Constitutional: Positive for fatigue. Negative for activity change, appetite change and unexpected weight change.  Cardiovascular: Negative for leg swelling.  Gastrointestinal: Positive for diarrhea. Negative for abdominal pain.  Neurological: Negative for weakness and light-headedness.       Objective:   Physical Exam  Constitutional: She is oriented to person, place, and time. She appears well-developed and well-nourished. No distress.  HENT:  Head: Normocephalic and atraumatic.  Right Ear: External ear normal.  Left Ear: External ear normal.  Nose: Nose normal.  Eyes: Conjunctivae and EOM are normal. Right eye exhibits no discharge. Left eye exhibits no discharge.  Cardiovascular: Normal rate, regular rhythm and normal heart sounds.   No murmur heard. Pulmonary/Chest: Effort normal and breath sounds normal. No respiratory distress.  Musculoskeletal: Normal range of motion.  Trace edema on R LE. None on L  Neurological: She is alert and oriented to person, place, and time.  Skin: Skin is warm and dry. She is not diaphoretic.  Psychiatric: She has a normal mood and affect. Her behavior is normal. Judgment and thought content normal.          Assessment & Plan:

## 2014-12-13 NOTE — Assessment & Plan Note (Signed)
Nighttime cough resolved. Cont on her PPI - has been on for yrs. Benefits outweigh risks.

## 2014-12-14 ENCOUNTER — Encounter: Payer: Self-pay | Admitting: Internal Medicine

## 2014-12-14 LAB — TSH: TSH: 0.644 u[IU]/mL (ref 0.350–4.500)

## 2014-12-24 ENCOUNTER — Ambulatory Visit (INDEPENDENT_AMBULATORY_CARE_PROVIDER_SITE_OTHER): Payer: Medicare Other | Admitting: Pharmacist

## 2014-12-24 DIAGNOSIS — Z7901 Long term (current) use of anticoagulants: Secondary | ICD-10-CM | POA: Diagnosis not present

## 2014-12-24 DIAGNOSIS — Z86718 Personal history of other venous thrombosis and embolism: Secondary | ICD-10-CM | POA: Diagnosis not present

## 2014-12-24 DIAGNOSIS — I8290 Acute embolism and thrombosis of unspecified vein: Secondary | ICD-10-CM

## 2014-12-24 LAB — POCT INR: INR: 2.8

## 2014-12-24 NOTE — Patient Instructions (Signed)
Patient instructed to take medications as defined in the Anti-coagulation Track section of this encounter.  Patient instructed to take today's dose.  Patient verbalized understanding of these instructions.    

## 2014-12-24 NOTE — Progress Notes (Signed)
Anti-Coagulation Progress Note  Pamela [name] is a 69 y.o. female who is currently on an anti-coagulation regimen.    RECENT RESULTS: Recent results are below, the most recent result is correlated with a dose of 20 mg. per week: Lab Results  Component Value Date   INR 2.80 12/24/2014   INR 2.5 11/26/2014   INR 1.70 11/05/2014    ANTI-COAG DOSE: Anticoagulation Dose Instructions as of 12/24/2014      Pamela [name] Tue Wed Thu Fri Sat   New Dose 2 mg 4 mg 4 mg 2 mg 4 mg 2 mg 2 mg       ANTICOAG SUMMARY: Anticoagulation Episode Summary    Current INR goal 2.0-3.0  Next INR check 01/21/2015  INR from last check 2.80 (12/24/2014)  Weekly max dose   Target end date Indefinite  [facility] check location [facility]  Preferred lab   Send INR reminders to ANTICOAG [facility]   Indications  VTE (venous thromboembolism) [I82.90] DVT HX OF (Resolved) [Z86.718] Long term current use of anticoagulant [Z79.01]        Comments       Anticoagulation Care Providers    Provider Role Specialty Phone number   Burman Freestone, MD  Internal Medicine [phone]      ANTICOAG TODAY: Anticoagulation Summary as of 12/24/2014    INR goal 2.0-3.0  Selected INR 2.80 (12/24/2014)  Next INR check 01/21/2015  Target end date Indefinite   Indications  VTE (venous thromboembolism) [I82.90] DVT HX OF (Resolved) [Z86.718] Long term current use of anticoagulant [Z79.01]      Anticoagulation Episode Summary    [facility] check location [facility]   Preferred lab    Send INR reminders to ANTICOAG [facility]   Comments     Anticoagulation Care Providers    Provider Role Specialty Phone number   Burman Freestone, MD  Internal Medicine [phone]      PATIENT INSTRUCTIONS: Patient Instructions  Patient instructed to take medications as defined in the Anti-coagulation Track section of this encounter.  Patient instructed to take today's dose.  Patient verbalized understanding of these instructions.        FOLLOW-UP Return in 4 weeks (on 01/21/2015) for Follow up INR at 1015h.  [name], III Pharm.D., CACP

## 2014-12-25 NOTE — Progress Notes (Signed)
INTERNAL MEDICINE TEACHING ATTENDING ADDENDUM - Denai Caba M.D  Duration- indefinite, Indication- DVT, INR- therapeutic. Agree with pharmacy recommendations as outlined in their note.     

## 2015-01-21 ENCOUNTER — Ambulatory Visit (INDEPENDENT_AMBULATORY_CARE_PROVIDER_SITE_OTHER): Payer: Medicare Other | Admitting: Pharmacist

## 2015-01-21 DIAGNOSIS — I8290 Acute embolism and thrombosis of unspecified vein: Secondary | ICD-10-CM

## 2015-01-21 DIAGNOSIS — Z86718 Personal history of other venous thrombosis and embolism: Secondary | ICD-10-CM

## 2015-01-21 DIAGNOSIS — Z7901 Long term (current) use of anticoagulants: Secondary | ICD-10-CM | POA: Diagnosis not present

## 2015-01-21 LAB — POCT INR: INR: 4.3

## 2015-01-21 NOTE — Patient Instructions (Signed)
Patient instructed to take medications as defined in the Anti-coagulation Track section of this encounter.  Patient instructed to OMIT TOMORROW's dose.  Patient verbalized understanding of these instructions.

## 2015-01-21 NOTE — Progress Notes (Signed)
Anti-Coagulation Progress Note  Pamela [name] is a 69 y.o. female who is currently on an anti-coagulation regimen.    RECENT RESULTS: Recent results are below, the most recent result is correlated with a dose of 20 mg. per week: Lab Results  Component Value Date   INR 4.3 01/21/2015   INR 2.80 12/24/2014   INR 2.5 11/26/2014    ANTI-COAG DOSE: Anticoagulation Dose Instructions as of 01/21/2015      Pamela [name] Tue Wed Thu Fri Sat   New Dose 2 mg 2 mg 4 mg 2 mg 4 mg 2 mg 2 mg    Description        OMIT dose for Tuesday 19-JUL-16 (had already taken today's dose).        ANTICOAG SUMMARY: Anticoagulation Episode Summary    Current INR goal 2.0-3.0  Next INR check 02/04/2015  INR from last check 4.3! (01/21/2015)  Weekly max dose   Target end date Indefinite  [facility] check location [facility]  Preferred lab   Send INR reminders to ANTICOAG [facility]   Indications  VTE (venous thromboembolism) [I82.90] DVT HX OF (Resolved) [Z86.718] Long term current use of anticoagulant [Z79.01]        Comments       Anticoagulation Care Providers    Provider Role Specialty Phone number   Burman Freestone, MD  Internal Medicine [phone]      ANTICOAG TODAY: Anticoagulation Summary as of 01/21/2015    INR goal 2.0-3.0  Selected INR 4.3! (01/21/2015)  Next INR check 02/04/2015  Target end date Indefinite   Indications  VTE (venous thromboembolism) [I82.90] DVT HX OF (Resolved) [Z86.718] Long term current use of anticoagulant [Z79.01]      Anticoagulation Episode Summary    [facility] check location [facility]   Preferred lab    Send INR reminders to ANTICOAG [facility]   Comments     Anticoagulation Care Providers    Provider Role Specialty Phone number   Burman Freestone, MD  Internal Medicine [phone]      PATIENT INSTRUCTIONS: Patient Instructions  Patient instructed to take medications as defined in the Anti-coagulation Track section of this encounter.  Patient instructed  to OMIT TOMORROW's dose.  Patient verbalized understanding of these instructions.       FOLLOW-UP Return in 2 weeks (on 02/04/2015) for Follow up INR at 0930h.  [name], III Pharm.D., CACP

## 2015-01-26 NOTE — Progress Notes (Signed)
I have reviewed Dr. Gladstone Pih note.  Pamela [name] is on coumadin for DVT.  INR elevated, dose decreased.

## 2015-02-04 ENCOUNTER — Ambulatory Visit (INDEPENDENT_AMBULATORY_CARE_PROVIDER_SITE_OTHER): Payer: Medicare Other | Admitting: Pharmacist

## 2015-02-04 DIAGNOSIS — Z86718 Personal history of other venous thrombosis and embolism: Secondary | ICD-10-CM | POA: Diagnosis not present

## 2015-02-04 DIAGNOSIS — I8290 Acute embolism and thrombosis of unspecified vein: Secondary | ICD-10-CM

## 2015-02-04 DIAGNOSIS — Z7901 Long term (current) use of anticoagulants: Secondary | ICD-10-CM

## 2015-02-04 LAB — POCT INR: INR: 2.6

## 2015-02-04 NOTE — Progress Notes (Signed)
Anti-Coagulation Progress Note  Pamela [name] is a 69 y.o. female who is currently on an anti-coagulation regimen.    RECENT RESULTS: Recent results are below, the most recent result is correlated with a dose of 18 mg. per week: Lab Results  Component Value Date   INR 2.60 02/04/2015   INR 4.3 01/21/2015   INR 2.80 12/24/2014    ANTI-COAG DOSE: Anticoagulation Dose Instructions as of 02/04/2015      [name] Tue Wed Thu Fri Sat   New Dose 2 mg 2 mg 4 mg 2 mg 4 mg 2 mg 2 mg    Description                ANTICOAG SUMMARY: Anticoagulation Episode Summary    Current INR goal 2.0-3.0  Next INR check 03/04/2015  INR from last check 2.60 (02/04/2015)  Weekly max dose   Target end date Indefinite  [facility] check location [facility]  Preferred lab   Send INR reminders to ANTICOAG [facility]   Indications  VTE (venous thromboembolism) [I82.90] DVT HX OF (Resolved) [Z86.718] Long term current use of anticoagulant [Z79.01]        Comments       Anticoagulation Care Providers    Provider Role Specialty Phone number   Burman Freestone, MD  Internal Medicine [phone]      ANTICOAG TODAY: Anticoagulation Summary as of 02/04/2015    INR goal 2.0-3.0  Selected INR 2.60 (02/04/2015)  Next INR check 03/04/2015  Target end date Indefinite   Indications  VTE (venous thromboembolism) [I82.90] DVT HX OF (Resolved) [Z86.718] Long term current use of anticoagulant [Z79.01]      Anticoagulation Episode Summary    [facility] check location [facility]   Preferred lab    Send INR reminders to ANTICOAG IMP   Comments     Anticoagulation Care Providers    Provider Role Specialty Phone number   Burman Freestone, MD  Internal Medicine [phone]      PATIENT INSTRUCTIONS: Patient Instructions  Patient instructed to take medications as defined in the Anti-coagulation Track section of this encounter.  Patient instructed to take today's dose.  Patient verbalized understanding of these  instructions.       FOLLOW-UP Return in 4 weeks (on 03/04/2015) for Follow up INR at 0930h.  [name], III Pharm.D., CACP

## 2015-02-04 NOTE — Patient Instructions (Signed)
Patient instructed to take medications as defined in the Anti-coagulation Track section of this encounter.  Patient instructed to take today's dose.  Patient verbalized understanding of these instructions.    

## 2015-02-12 NOTE — Progress Notes (Signed)
I have reviewed Dr. Gladstone Pih note.  Patient is on anticoagulation for VTE.  INR was appropriate.

## 2015-03-04 ENCOUNTER — Ambulatory Visit (INDEPENDENT_AMBULATORY_CARE_PROVIDER_SITE_OTHER): Payer: Medicare Other | Admitting: Internal Medicine

## 2015-03-04 ENCOUNTER — Ambulatory Visit (INDEPENDENT_AMBULATORY_CARE_PROVIDER_SITE_OTHER): Payer: Medicare Other | Admitting: Pharmacist

## 2015-03-04 ENCOUNTER — Encounter: Payer: Self-pay | Admitting: Internal Medicine

## 2015-03-04 VITALS — BP 178/89 | HR 86 | Temp 97.1°F | Wt 195.8 lb

## 2015-03-04 DIAGNOSIS — I8290 Acute embolism and thrombosis of unspecified vein: Secondary | ICD-10-CM | POA: Diagnosis not present

## 2015-03-04 DIAGNOSIS — Z7901 Long term (current) use of anticoagulants: Secondary | ICD-10-CM | POA: Diagnosis not present

## 2015-03-04 DIAGNOSIS — Z8739 Personal history of other diseases of the musculoskeletal system and connective tissue: Secondary | ICD-10-CM | POA: Insufficient documentation

## 2015-03-04 DIAGNOSIS — I10 Essential (primary) hypertension: Secondary | ICD-10-CM | POA: Diagnosis not present

## 2015-03-04 DIAGNOSIS — Z23 Encounter for immunization: Secondary | ICD-10-CM

## 2015-03-04 DIAGNOSIS — Z86718 Personal history of other venous thrombosis and embolism: Secondary | ICD-10-CM

## 2015-03-04 DIAGNOSIS — M109 Gout, unspecified: Secondary | ICD-10-CM

## 2015-03-04 DIAGNOSIS — M10071 Idiopathic gout, right ankle and foot: Secondary | ICD-10-CM | POA: Diagnosis not present

## 2015-03-04 LAB — POCT INR: INR: 2.5

## 2015-03-04 MED ORDER — NAPROXEN 500 MG PO TABS: 500.0000 mg | ORAL_TABLET | Freq: Two times a day (BID) | ORAL | Status: DC

## 2015-03-04 MED ORDER — LISINOPRIL 10 MG PO TABS: 10.0000 mg | ORAL_TABLET | Freq: Every day | ORAL | Status: DC

## 2015-03-04 NOTE — Progress Notes (Signed)
INTERNAL MEDICINE TEACHING ATTENDING ADDENDUM - Lakeyshia Tuckerman M.D  Duration- indefinite, Indication- recurrent DVT, INR- therapeutic. Agree with pharmacy recommendations as outlined in their note.     

## 2015-03-04 NOTE — Patient Instructions (Signed)
-   Stop taking hydrochlorothiazide - Start taking Naproxen 500 mg twice daily. If your symptoms do not improve in the next 7 days please call the clinic. - Start taking Lisinopril 10 mg daily for your blood pressure  General Instructions:   Please bring your medicines with you each time you come to clinic.  Medicines may include prescription medications, over-the-counter medications, herbal remedies, eye drops, vitamins, or other pills.   Progress Toward Treatment Goals:  Treatment Goal 02/14/2013  Blood pressure deteriorated    Self Care Goals & Plans:  Self Care Goal 12/13/2014  Manage my medications take my medicines as prescribed; bring my medications to every visit  Monitor my health keep track of my weight  Eat healthy foods eat foods that are low in salt; eat baked foods instead of fried foods  Be physically active find an activity I enjoy    No flowsheet data found.   Care Management & Community Referrals:  Referral 02/14/2013  Referrals made for care management support none needed

## 2015-03-04 NOTE — Patient Instructions (Signed)
Patient instructed to take medications as defined in the Anti-coagulation Track section of this encounter.  Patient instructed to take today's dose.  Patient verbalized understanding of these instructions.    

## 2015-03-04 NOTE — Progress Notes (Signed)
   Subjective:    Patient ID: Pamela [name], female    DOB: Jul 16, 1945, 69 y.o.   MRN: [id number]  HPI Pamela [name] is a 69yo woman with PMHx of HTN, VTE on coumadin, GERD, and hypothyroidism who presents today for an acute visit.   She reports she woke up yesterday morning and had severe pain in her right big toe. She notes pain is worse with walking and improves with elevation of her leg. She also notes redness and swelling of her toe and foot. She took two Tylenol for the pain which gave her minimal relief. She denies any trauma to the foot/toe. She denies any history of gout. She denies heavy consumption of red meat or beer. She denies any fevers, chills.    Review of Systems General: Denies fever, chills, night sweats, changes in weight, changes in appetite HEENT: Denies headaches, ear pain, changes in vision, rhinorrhea, sore throat CV: Denies CP, palpitations, SOB, orthopnea Pulm: Denies SOB, cough, wheezing GI: Denies abdominal pain, nausea, vomiting, diarrhea, constipation, melena, hematochezia GU: Denies dysuria, hematuria, frequency Msk: Reports right big toe pain, swelling. Denies muscle cramps Neuro: Denies weakness, numbness, tingling Skin: Denies rashes, bruising    Objective:   Physical Exam General: alert, sitting up in chair, NAD HEENT: Pamela [name]/AT, EOMI, sclera anicteric, mucus membranes moist CV: RRR, no m/g/r Pulm: CTA bilaterally, breaths non-labored Ext: Her right big toe is erythematous, swollen, and extremely tender to palpation. All other toes are not tender to touch. Her right foot is swollen and erythematous compared to the left side. Gait is limited due to pain.   Neuro: alert and oriented x 3     Assessment & Plan:  Please refer to A&P documentation.

## 2015-03-04 NOTE — Progress Notes (Signed)
Anti-Coagulation Progress Note  Pamela [name] is a 69 y.o. female who is currently on an anti-coagulation regimen.    RECENT RESULTS: Recent results are below, the most recent result is correlated with a dose of 26 mg. per week: Lab Results  Component Value Date   INR 2.50 03/04/2015   INR 2.60 02/04/2015   INR 4.3 01/21/2015    ANTI-COAG DOSE: Anticoagulation Dose Instructions as of 03/04/2015      [name] Tue Wed Thu Fri Sat   New Dose 2 mg 2 mg 4 mg 2 mg 4 mg 2 mg 2 mg    Description                ANTICOAG SUMMARY: Anticoagulation Episode Summary    Current INR goal 2.0-3.0  Next INR check 04/01/2015  INR from last check 2.50 (03/04/2015)  Weekly max dose   Target end date Indefinite  [facility]  Preferred lab   Send INR reminders to [facility]   Indications  VTE (venous thromboembolism) [I82.90] DVT HX OF (Resolved) [Z86.718] Long term current use of anticoagulant [Z79.01]        Comments       Anticoagulation Care Providers    Provider Role Specialty Phone number   Burman Freestone, MD  Internal Medicine [phone]      ANTICOAG TODAY: Anticoagulation Summary as of 03/04/2015    INR goal 2.0-3.0  Selected INR 2.50 (03/04/2015)  Next INR check 04/01/2015  Target end date Indefinite   Indications  VTE (venous thromboembolism) [I82.90] DVT HX OF (Resolved) [Z86.718] Long term current use of anticoagulant [Z79.01]      Anticoagulation Episode Summary    [facility] check [facility]   Preferred lab    Send INR reminders to [facility]   Comments     Anticoagulation Care Providers    Provider Role Specialty Phone number   Burman Freestone, MD  Internal Medicine [phone]      PATIENT INSTRUCTIONS: Patient Instructions  Patient instructed to take medications as defined in the Anti-coagulation Track section of this encounter.  Patient instructed to take today's dose.  Patient verbalized understanding of  these instructions.       FOLLOW-UP Return in 4 weeks (on 04/01/2015) for Follow up INR at 0915h.  [name], III Pharm.D., CACP

## 2015-03-04 NOTE — Assessment & Plan Note (Signed)
BP Readings from Last 3 Encounters:  03/04/15 178/89  12/13/14 129/70  01/25/14 135/87    Lab Results  Component Value Date   NA 138 12/13/2014   K 4.4 12/13/2014   CREATININE 0.78 12/13/2014    Assessment: Comments: BP moderately elevated today. Patient admits to running out of her HCTZ and has not taken for the last 2 days. Her BP is also likely elevated due to pain from her acute gout.   Plan: Medications:  Discontinued HCTZ as this can precipitate acute gout flares. Started Lisinopril 10 mg daily.  Other plans:  - BP recheck in 2 weeks - Adjust Lisinopril dose as necessary

## 2015-03-04 NOTE — Assessment & Plan Note (Signed)
Patient's symptoms and exam findings most consistent with an acute gout flare.  - Prescribed Naproxen 500 mg BID. Patient instructed to call clinic if symptoms do not improve in next 7 days. - Discontinued HCTZ as this can precipitate gout flares. Switched to Lisinopril for BP control.  - Check uric acid level once acute flare resolves

## 2015-03-12 NOTE — Progress Notes (Signed)
Internal Medicine Clinic Attending  Case discussed with Dr. Rivet soon after the resident saw the patient.  We reviewed the resident's history and exam and pertinent patient test results.  I agree with the assessment, diagnosis, and plan of care documented in the resident's note.  

## 2015-03-20 ENCOUNTER — Other Ambulatory Visit: Payer: Self-pay | Admitting: Internal Medicine

## 2015-04-01 ENCOUNTER — Ambulatory Visit (INDEPENDENT_AMBULATORY_CARE_PROVIDER_SITE_OTHER): Payer: Medicare Other

## 2015-04-01 DIAGNOSIS — I8290 Acute embolism and thrombosis of unspecified vein: Secondary | ICD-10-CM

## 2015-04-01 DIAGNOSIS — Z7901 Long term (current) use of anticoagulants: Secondary | ICD-10-CM | POA: Diagnosis not present

## 2015-04-01 LAB — POCT INR: INR: 2.8

## 2015-04-01 NOTE — Progress Notes (Signed)
Anti-Coagulation Progress Note  Pamela [name] is a 69 y.o. female who is currently on an anti-coagulation regimen.    RECENT RESULTS: Recent results are below, the most recent result is correlated with a dose of 18 mg. per week: Lab Results  Component Value Date   INR 2.80 04/01/2015   INR 2.50 03/04/2015   INR 2.60 02/04/2015    ANTI-COAG DOSE: Anticoagulation Dose Instructions as of 04/01/2015      [name] Tue Wed Thu Fri Sat   New Dose 2 mg 2 mg 2 mg 2 mg 4 mg 2 mg 2 mg    Description                ANTICOAG SUMMARY: Anticoagulation Episode Summary    Current INR goal 2.0-3.0  Next INR check 04/29/2015  INR from last check 2.80 (04/01/2015)  Weekly max dose   Target end date Indefinite  [facility]  Preferred lab   Send INR reminders to [facility]   Indications  VTE (venous thromboembolism) [I82.90] DVT HX OF (Resolved) [Z86.718] Long term current use of anticoagulant [Z79.01]        Comments       Anticoagulation Care Providers    Provider Role Specialty Phone number   Burman Freestone, MD  Internal Medicine [phone]      ANTICOAG TODAY: Anticoagulation Summary as of 04/01/2015    INR goal 2.0-3.0  Selected INR 2.80 (04/01/2015)  Next INR check 04/29/2015  Target end date Indefinite   Indications  VTE (venous thromboembolism) [I82.90] DVT HX OF (Resolved) [Z86.718] Long term current use of anticoagulant [Z79.01]      Anticoagulation Episode Summary    [facility] check [facility]   Preferred lab    Send INR reminders to [facility]   Comments     Anticoagulation Care Providers    Provider Role Specialty Phone number   Burman Freestone, MD  Internal Medicine [phone]      PATIENT INSTRUCTIONS: Patient Instructions  Patient instructed to take medications as defined in the Anti-coagulation Track section of this encounter.  Patient instructed to take today's dose.  Patient verbalized understanding  of these instructions.       FOLLOW-UP Return in 4 weeks (on 04/29/2015) for Follow up INR at 0930am.  Meagan C. Lennox Grumbles, PharmD Pharmacy Resident  Pager: [phone] 04/01/2015 4:06 PM

## 2015-04-01 NOTE — Patient Instructions (Signed)
Patient instructed to take medications as defined in the Anti-coagulation Track section of this encounter.  Patient instructed to take today's dose.  Patient verbalized understanding of these instructions.    

## 2015-04-02 NOTE — Progress Notes (Signed)
I have reviewed anticoagulation note.  Pamela [name] is on anticoagulation for VTE.  Her INR was 2.8, coumadin decreased slightly.

## 2015-04-29 ENCOUNTER — Ambulatory Visit (INDEPENDENT_AMBULATORY_CARE_PROVIDER_SITE_OTHER): Payer: Medicare Other | Admitting: Pharmacist

## 2015-04-29 DIAGNOSIS — Z7901 Long term (current) use of anticoagulants: Secondary | ICD-10-CM

## 2015-04-29 DIAGNOSIS — I8290 Acute embolism and thrombosis of unspecified vein: Secondary | ICD-10-CM

## 2015-04-29 LAB — POCT INR: INR: 2

## 2015-04-29 NOTE — Patient Instructions (Signed)
Patient instructed to take medications as defined in the Anti-coagulation Track section of this encounter.  Patient instructed to take today's dose.  Patient verbalized understanding of these instructions.    

## 2015-04-29 NOTE — Progress Notes (Signed)
Anti-Coagulation Progress Note  ZONNIQUE NORKUS is a 69 y.o. female who is currently on an anti-coagulation regimen.    RECENT RESULTS: Recent results are below, the most recent result is correlated with a dose of 16 mg. per week: Lab Results  Component Value Date   INR 2.00 04/29/2015   INR 2.80 04/01/2015   INR 2.50 03/04/2015    ANTI-COAG DOSE: Anticoagulation Dose Instructions as of 04/29/2015      [name] Tue Wed Thu Fri Sat   New Dose 2 mg 2 mg 4 mg 2 mg 4 mg 2 mg 2 mg    Description                ANTICOAG SUMMARY: Anticoagulation Episode Summary    Current INR goal 2.0-3.0  Next INR check 05/27/2015  INR from last check 2.00 (04/29/2015)  Weekly max dose   Target end date Indefinite  [facility]  Preferred lab   Send INR reminders to [facility]   Indications  VTE (venous thromboembolism) [I82.90] DVT HX OF (Resolved) [Z86.718] Long term current use of anticoagulant [Z79.01]        Comments       Anticoagulation Care Providers    Provider Role Specialty Phone number   Burman Freestone, MD  Internal Medicine [phone]      ANTICOAG TODAY: Anticoagulation Summary as of 04/29/2015    INR goal 2.0-3.0  Selected INR 2.00 (04/29/2015)  Next INR check 05/27/2015  Target end date Indefinite   Indications  VTE (venous thromboembolism) [I82.90] DVT HX OF (Resolved) [Z86.718] Long term current use of anticoagulant [Z79.01]      Anticoagulation Episode Summary    [facility] check [facility]   Preferred lab    Send INR reminders to [facility]   Comments     Anticoagulation Care Providers    Provider Role Specialty Phone number   Burman Freestone, MD  Internal Medicine [phone]      PATIENT INSTRUCTIONS: Patient Instructions  Patient instructed to take medications as defined in the Anti-coagulation Track section of this encounter.  Patient instructed to take today's dose.  Patient verbalized  understanding of these instructions.       FOLLOW-UP Return in 4 weeks (on 05/27/2015), or Follow up INR at 1000h.  [name], III Pharm.D., CACP

## 2015-04-30 NOTE — Progress Notes (Signed)
Indication: Recurrent deep venous thrombosis.  Duration: Lifelong.  INR at target.  Agree with Dr. Gladstone Pih assessment and plan as documented.

## 2015-05-27 ENCOUNTER — Ambulatory Visit (INDEPENDENT_AMBULATORY_CARE_PROVIDER_SITE_OTHER): Payer: Medicare Other | Admitting: Pharmacist

## 2015-05-27 DIAGNOSIS — I8290 Acute embolism and thrombosis of unspecified vein: Secondary | ICD-10-CM | POA: Diagnosis not present

## 2015-05-27 DIAGNOSIS — Z7901 Long term (current) use of anticoagulants: Secondary | ICD-10-CM

## 2015-05-27 LAB — POCT INR: INR: 1.8

## 2015-05-27 NOTE — Patient Instructions (Signed)
Patient instructed to take medications as defined in the Anti-coagulation Track section of this encounter.  Patient instructed to take today's dose.  Patient verbalized understanding of these instructions.    

## 2015-05-27 NOTE — Progress Notes (Signed)
Anti-Coagulation Progress Note  Pamela [name] is a 69 y.o. female who is currently on an anti-coagulation regimen.    RECENT RESULTS: Recent results are below, the most recent result is correlated with a dose of 18 mg. per week: Lab Results  Component Value Date   INR 1.80 05/27/2015   INR 2.00 04/29/2015   INR 2.80 04/01/2015    ANTI-COAG DOSE: Anticoagulation Dose Instructions as of 05/27/2015      [name] Tue Wed Thu Fri Sat   New Dose 2 mg 2 mg 4 mg 2 mg 4 mg 2 mg 4 mg    Description                ANTICOAG SUMMARY: Anticoagulation Episode Summary    Current INR goal 2.0-3.0  Next INR check 06/24/2015  INR from last check 1.80! (05/27/2015)  Weekly max dose   Target end date Indefinite  [facility] check [facility]  Preferred lab   Send INR reminders to ANT[facility]   Indications  VTE (venous thromboembolism) [I82.90] DVT HX OF (Resolved) [Z86.718] Long term current use of anticoagulant [Z79.01]        Comments       Anticoagulation Care Providers    Provider Role Specialty Phone number   Burman Freestone, MD  Internal Medicine [phone]      ANTICOAG TODAY: Anticoagulation Summary as of 05/27/2015    INR goal 2.0-3.0  Selected INR 1.80! (05/27/2015)  Next INR check 06/24/2015  Target end date Indefinite   Indications  VTE (venous thromboembolism) [I82.90] DVT HX OF (Resolved) [Z86.718] Long term current use of anticoagulant [Z79.01]      Anticoagulation Episode Summary    [facility] check location [facility]   Preferred lab    Send INR reminders to [facility]   Comments     Anticoagulation Care Providers    Provider Role Specialty Phone number   Burman Freestone, MD  Internal Medicine [phone]      PATIENT INSTRUCTIONS: Patient Instructions  Patient instructed to take medications as defined in the Anti-coagulation Track section of this encounter.  Patient instructed to take today's dose.  Patient verbalized  understanding of these instructions.       FOLLOW-UP Return in 4 weeks (on 06/24/2015) for Follow up INR at 1015h.  [name], III Pharm.D., CACP

## 2015-06-24 ENCOUNTER — Ambulatory Visit: Payer: Medicare Other

## 2015-07-02 ENCOUNTER — Other Ambulatory Visit: Payer: Self-pay | Admitting: Internal Medicine

## 2015-07-02 ENCOUNTER — Ambulatory Visit (INDEPENDENT_AMBULATORY_CARE_PROVIDER_SITE_OTHER): Payer: Medicare Other | Admitting: Pharmacist

## 2015-07-02 DIAGNOSIS — I8290 Acute embolism and thrombosis of unspecified vein: Secondary | ICD-10-CM | POA: Diagnosis not present

## 2015-07-02 DIAGNOSIS — Z7901 Long term (current) use of anticoagulants: Secondary | ICD-10-CM

## 2015-07-02 LAB — POCT INR: INR: 2.2

## 2015-07-02 NOTE — Progress Notes (Signed)
Anticoagulation Management Pamela [name] is a 69 y.o. female who reports to the clinic for monitoring of warfarin treatment.    Indication: DVThistory Duration: indefinite  Anticoagulation Clinic Visit History: Patient does not report signs/symptoms of bleeding or thromboembolism Other recent changes: none reported other than travel to [place] and upcoming trip to [place] Anticoagulation Episode Summary    Current INR goal 2.0-3.0  Next INR check 07/29/2015  INR from last check 2.2 (07/02/2015)  Weekly max dose   Target end date Indefinite  [facility] check [facility]  Preferred lab   Send INR reminders to [facility]   Indications  VTE (venous thromboembolism) [I82.90] DVT HX OF (Resolved) [Z86.718] Long term current use of anticoagulant [Z79.01]        Comments       Anticoagulation Care Providers    Provider Role Specialty Phone number   Burman Freestone, MD  Internal Medicine [phone]     ASSESSMENT Recent Results: Recent results are below, the most recent result is correlated with a dose of 20 mg per week: Lab Results  Component Value Date   INR 2.2 07/02/2015   INR 1.80 05/27/2015   INR 2.00 04/29/2015   INR today: Therapeutic  Anticoagulation Dosing: INR as of 07/02/2015 and Previous Dosing Information    INR Dt INR Goal [employer] Sun Mon Tue Wed Thu Fri Sat   07/02/2015 2.2 2.0-3.0 20 mg 2 mg 2 mg 4 mg 2 mg 4 mg 2 mg 4 mg    Previous description             Anticoagulation Dose Instructions as of 07/02/2015      Total Sun Mon Tue Wed Thu Fri Sat   New Dose 20 mg 2 mg 2 mg 4 mg 2 mg 4 mg 2 mg 4 mg     (4 mg x 0.5)  (4 mg x 0.5)  (4 mg x 1)  (4 mg x 0.5)  (4 mg x 1)  (4 mg x 0.5)  (4 mg x 1)                         Description               PLAN Weekly dose was unchanged  Patient Instructions  Patient educated about medication as defined in this encounter and verbalized understanding by repeating back instructions provided.    Follow-up Return in about 4 weeks (around 07/29/2015) for Follow up INR on 07/29/15 at 10am.  Kim,Jennifer J  15 minutes spent face-to-face with the patient during the encounter. 50% of time spent on education. 50% of time was spent on assessment and plan.

## 2015-07-02 NOTE — Patient Instructions (Signed)
Patient educated about medication as defined in this encounter and verbalized understanding by repeating back instructions provided.   

## 2015-07-03 NOTE — Progress Notes (Signed)
I have reviewed Dr. Kim's note.  

## 2015-07-29 ENCOUNTER — Ambulatory Visit (INDEPENDENT_AMBULATORY_CARE_PROVIDER_SITE_OTHER): Payer: Medicare Other | Admitting: Pharmacist

## 2015-07-29 DIAGNOSIS — I8290 Acute embolism and thrombosis of unspecified vein: Secondary | ICD-10-CM | POA: Diagnosis not present

## 2015-07-29 DIAGNOSIS — Z7901 Long term (current) use of anticoagulants: Secondary | ICD-10-CM | POA: Diagnosis not present

## 2015-07-29 LAB — POCT INR: INR: 2.6

## 2015-07-29 NOTE — Progress Notes (Signed)
Anti-Coagulation Progress Note  Pamela [name] is a 71 y.o. female who is currently on an anti-coagulation regimen.    RECENT RESULTS: Recent results are below, the most recent result is correlated with a dose of 20 mg. per week: Lab Results  Component Value Date   INR 2.60 07/29/2015   INR 2.2 07/02/2015   INR 1.80 05/27/2015    ANTI-COAG DOSE: Anticoagulation Dose Instructions as of 07/29/2015      [name] Tue Wed Thu Fri Sat   New Dose 2 mg 2 mg 4 mg 2 mg 4 mg 2 mg 4 mg    Description                ANTICOAG SUMMARY: Anticoagulation Episode Summary    Current INR goal 2.0-3.0  Next INR check 09/02/2015  INR from last check 2.60 (07/29/2015)  Weekly max dose   Target end date Indefinite  [facility]  Preferred lab   Send INR reminders to [facility]   Indications  VTE (venous thromboembolism) [I82.90] DVT HX OF (Resolved) [Z86.718] Long term current use of anticoagulant [Z79.01]        Comments       Anticoagulation Care Providers    Provider Role Specialty Phone number   Burman Freestone, MD  Internal Medicine [phone]      ANTICOAG TODAY: Anticoagulation Summary as of 07/29/2015    INR goal 2.0-3.0  Selected INR 2.60 (07/29/2015)  Next INR check 09/02/2015  Target end date Indefinite   Indications  VTE (venous thromboembolism) [I82.90] DVT HX OF (Resolved) [Z86.718] Long term current use of anticoagulant [Z79.01]      Anticoagulation Episode Summary    [facility] check location [facility]   Preferred lab    Send INR reminders to [facility]   Comments     Anticoagulation Care Providers    Provider Role Specialty Phone number   Burman Freestone, MD  Internal Medicine [phone]      PATIENT INSTRUCTIONS: Patient Instructions  Patient instructed to take medications as defined in the Anti-coagulation Track section of this encounter.  Patient instructed to take today's dose.  Patient verbalized understanding of  these instructions.       FOLLOW-UP Return in 5 weeks (on 09/02/2015) for Follow up INR at 1000h.  [name], III Pharm.D., CACP

## 2015-07-29 NOTE — Patient Instructions (Signed)
Patient instructed to take medications as defined in the Anti-coagulation Track section of this encounter.  Patient instructed to take today's dose.  Patient verbalized understanding of these instructions.    

## 2015-08-26 ENCOUNTER — Other Ambulatory Visit: Payer: Self-pay | Admitting: Internal Medicine

## 2015-08-26 NOTE — Telephone Encounter (Signed)
Pls sch June appt with me

## 2015-09-02 ENCOUNTER — Ambulatory Visit (INDEPENDENT_AMBULATORY_CARE_PROVIDER_SITE_OTHER): Payer: Medicare Other | Admitting: Pharmacist

## 2015-09-02 DIAGNOSIS — I8290 Acute embolism and thrombosis of unspecified vein: Secondary | ICD-10-CM

## 2015-09-02 DIAGNOSIS — Z7901 Long term (current) use of anticoagulants: Secondary | ICD-10-CM | POA: Diagnosis not present

## 2015-09-02 LAB — POCT INR: INR: 3

## 2015-09-02 NOTE — Patient Instructions (Signed)
Patient instructed to take medications as defined in the Anti-coagulation Track section of this encounter.  Patient instructed to take today's dose.  Patient verbalized understanding of these instructions.    

## 2015-09-02 NOTE — Progress Notes (Signed)
Anti-Coagulation Progress Note  Pamela [name] is a 70 y.o. female who is currently on an anti-coagulation regimen.    RECENT RESULTS: Recent results are below, the most recent result is correlated with a dose of 20 mg. per week: Lab Results  Component Value Date   INR 3.00 09/02/2015   INR 2.60 07/29/2015   INR 2.2 07/02/2015    ANTI-COAG DOSE: Anticoagulation Dose Instructions as of 09/02/2015      [name] Tue Wed Thu Fri Sat   New Dose 2 mg 2 mg 4 mg 2 mg 2 mg 4 mg 2 mg    Description                ANTICOAG SUMMARY: Anticoagulation Episode Summary    Current INR goal 2.0-3.0  Next INR check 09/30/2015  INR from last check 3.00 (09/02/2015)  Weekly max dose   Target end date Indefinite  [facility] check location [facility]  Preferred lab   Send INR reminders to [facility]   Indications  VTE (venous thromboembolism) [I82.90] DVT HX OF (Resolved) [Z86.718] Long term current use of anticoagulant [Z79.01]        Comments       Anticoagulation Care Providers    Provider Role Specialty Phone number   Burman Freestone, MD  Internal Medicine [phone]      ANTICOAG TODAY: Anticoagulation Summary as of 09/02/2015    INR goal 2.0-3.0  Selected INR 3.00 (09/02/2015)  Next INR check 09/30/2015  Target end date Indefinite   Indications  VTE (venous thromboembolism) [I82.90] DVT HX OF (Resolved) [Z86.718] Long term current use of anticoagulant [Z79.01]      Anticoagulation Episode Summary    [facility] check location [facility]   Preferred lab    Send INR reminders to ANTICOAG [facility]   Comments     Anticoagulation Care Providers    Provider Role Specialty Phone number   Burman Freestone, MD  Internal Medicine [phone]      PATIENT INSTRUCTIONS: Patient Instructions  Patient instructed to take medications as defined in the Anti-coagulation Track section of this encounter.  Patient instructed to take today's dose.  Patient verbalized understanding of  these instructions.       FOLLOW-UP Return in 4 weeks (on 09/30/2015) for Follow up INR at 1015h.  [name], III Pharm.D., CACP

## 2015-09-18 ENCOUNTER — Other Ambulatory Visit (HOSPITAL_COMMUNITY)
Admission: RE | Admit: 2015-09-18 | Discharge: 2015-09-18 | Disposition: A | Discharge status: Home / Self Care | Payer: Medicare Other | Source: Ambulatory Visit | Attending: Obstetrics and Gynecology | Admitting: Obstetrics and Gynecology

## 2015-09-18 ENCOUNTER — Other Ambulatory Visit: Payer: Self-pay | Admitting: Obstetrics and Gynecology

## 2015-09-18 DIAGNOSIS — Z1151 Encounter for screening for human papillomavirus (HPV): Secondary | ICD-10-CM | POA: Insufficient documentation

## 2015-09-18 DIAGNOSIS — Z01419 Encounter for gynecological examination (general) (routine) without abnormal findings: Secondary | ICD-10-CM | POA: Insufficient documentation

## 2015-09-20 LAB — CYTOLOGY - PAP

## 2015-09-24 ENCOUNTER — Other Ambulatory Visit: Payer: Self-pay | Admitting: Internal Medicine

## 2015-09-30 ENCOUNTER — Ambulatory Visit (INDEPENDENT_AMBULATORY_CARE_PROVIDER_SITE_OTHER): Payer: Medicare Other | Admitting: Pharmacist

## 2015-09-30 DIAGNOSIS — I8290 Acute embolism and thrombosis of unspecified vein: Secondary | ICD-10-CM | POA: Diagnosis not present

## 2015-09-30 DIAGNOSIS — Z7901 Long term (current) use of anticoagulants: Secondary | ICD-10-CM

## 2015-09-30 LAB — POCT INR: INR: 2.2

## 2015-09-30 NOTE — Patient Instructions (Signed)
Patient instructed to take medications as defined in the Anti-coagulation Track section of this encounter.  Patient instructed to take today's dose.  Patient verbalized understanding of these instructions.    

## 2015-09-30 NOTE — Progress Notes (Signed)
Anti-Coagulation Progress Note  Pamela [name] is a 70 y.o. female who is currently on an anti-coagulation regimen.    RECENT RESULTS: Recent results are below, the most recent result is correlated with a dose of 18 mg. per week: Lab Results  Component Value Date   INR 2.20 09/30/2015   INR 3.00 09/02/2015   INR 2.60 07/29/2015    ANTI-COAG DOSE: Anticoagulation Dose Instructions as of 09/30/2015      [name] Tue Wed Thu Fri Sat   New Dose 2 mg 2 mg 4 mg 2 mg 2 mg 4 mg 2 mg    Description                ANTICOAG SUMMARY: Anticoagulation Episode Summary    Current INR goal 2.0-3.0  Next INR check 10/28/2015  INR from last check 2.20 (09/30/2015)  Weekly max dose   Target end date Indefinite  [facility] check location [facility]  Preferred lab   Send INR reminders to ANT[facility]   Indications  VTE (venous thromboembolism) [I82.90] DVT HX OF (Resolved) [Z86.718] Long term current use of anticoagulant [Z79.01]        Comments       Anticoagulation Care Providers    Provider Role Specialty Phone number   Burman Freestone, MD  Internal Medicine [phone]      ANTICOAG TODAY: Anticoagulation Summary as of 09/30/2015    INR goal 2.0-3.0  Selected INR 2.20 (09/30/2015)  Next INR check 10/28/2015  Target end date Indefinite   Indications  VTE (venous thromboembolism) [I82.90] DVT HX OF (Resolved) [Z86.718] Long term current use of anticoagulant [Z79.01]      Anticoagulation Episode Summary    [facility] check location [facility]   Preferred lab    Send INR reminders to ANT[facility]AG [facility]   Comments     Anticoagulation Care Providers    Provider Role Specialty Phone number   Burman Freestone, MD  Internal Medicine [phone]      PATIENT INSTRUCTIONS: Patient Instructions  Patient instructed to take medications as defined in the Anti-coagulation Track section of this encounter.  Patient instructed to take today's dose.  Patient verbalized understanding of  these instructions.       FOLLOW-UP Return in 4 weeks (on 10/28/2015) for Follow up INR at 1030h.  [name], III Pharm.D., CACP

## 2015-10-02 NOTE — Progress Notes (Signed)
INTERNAL MEDICINE TEACHING ATTENDING ADDENDUM - Aldine Contes M.D  Duration- lifelong, Indication- recurrent DVT, INR- therapeutic. Agree with pharmacy recommendations as outlined in their note.

## 2015-10-28 ENCOUNTER — Ambulatory Visit (INDEPENDENT_AMBULATORY_CARE_PROVIDER_SITE_OTHER): Payer: Medicare Other | Admitting: Pharmacist

## 2015-10-28 DIAGNOSIS — Z7901 Long term (current) use of anticoagulants: Secondary | ICD-10-CM

## 2015-10-28 DIAGNOSIS — I8290 Acute embolism and thrombosis of unspecified vein: Secondary | ICD-10-CM | POA: Diagnosis not present

## 2015-10-28 LAB — POCT INR: INR: 1.9

## 2015-10-28 NOTE — Patient Instructions (Signed)
Patient instructed to take medications as defined in the Anti-coagulation Track section of this encounter.  Patient instructed to take today's dose.  Patient verbalized understanding of these instructions.    

## 2015-10-28 NOTE — Progress Notes (Signed)
Indication: Recurrent deep venous thomboses. Duration: Indefinite. INR: Below target. Agree with Dr. Gladstone Pih assessment and plan.

## 2015-10-28 NOTE — Progress Notes (Signed)
Anti-Coagulation Progress Note  Pamela [name] is a 71 y.o. female who is currently on an anti-coagulation regimen.    RECENT RESULTS: Recent results are below, the most recent result is correlated with a dose of 18 mg. per week: Lab Results  Component Value Date   INR 1.90 10/28/2015   INR 2.20 09/30/2015   INR 3.00 09/02/2015    ANTI-COAG DOSE: Anticoagulation Dose Instructions as of 10/28/2015      [name] Tue Wed Thu Fri Sat   New Dose 2 mg 4 mg 2 mg 4 mg 2 mg 4 mg 2 mg    Description                ANTICOAG SUMMARY: Anticoagulation Episode Summary    Current INR goal 2.0-3.0  Next INR check 11/25/2015  INR from last check 1.90! (10/28/2015)  Weekly max dose   Target end date Indefinite  [facility] check location [facility]  Preferred lab   Send INR reminders to ANT[facility]   Indications  VTE (venous thromboembolism) [I82.90] DVT HX OF (Resolved) [Z86.718] Long term current use of anticoagulant [Z79.01]        Comments       Anticoagulation Care Providers    Provider Role Specialty Phone number   Burman Freestone, MD  Internal Medicine [phone]      ANTICOAG TODAY: Anticoagulation Summary as of 10/28/2015    INR goal 2.0-3.0  Selected INR 1.90! (10/28/2015)  Next INR check 11/25/2015  Target end date Indefinite   Indications  VTE (venous thromboembolism) [I82.90] DVT HX OF (Resolved) [Z86.718] Long term current use of anticoagulant [Z79.01]      Anticoagulation Episode Summary    [facility] check location [facility]   Preferred lab    Send INR reminders to ANTICOAG [facility]   Comments     Anticoagulation Care Providers    Provider Role Specialty Phone number   Burman Freestone, MD  Internal Medicine [phone]      PATIENT INSTRUCTIONS: Patient Instructions  Patient instructed to take medications as defined in the Anti-coagulation Track section of this encounter.  Patient instructed to take today's dose.  Patient verbalized understanding  of these instructions.       FOLLOW-UP Return in 4 weeks (on 11/25/2015) for Follow up INR at 1015h.  [name], III Pharm.D., CACP

## 2015-11-25 ENCOUNTER — Ambulatory Visit (INDEPENDENT_AMBULATORY_CARE_PROVIDER_SITE_OTHER): Payer: Medicare Other | Admitting: Pharmacist

## 2015-11-25 DIAGNOSIS — Z7901 Long term (current) use of anticoagulants: Secondary | ICD-10-CM

## 2015-11-25 DIAGNOSIS — I8290 Acute embolism and thrombosis of unspecified vein: Secondary | ICD-10-CM

## 2015-11-25 LAB — POCT INR: INR: 2.5

## 2015-11-25 NOTE — Progress Notes (Signed)
INTERNAL MEDICINE TEACHING ATTENDING ADDENDUM - Kemond Amorin M.D  Duration- indefinite, Indication- recurrent DVT, INR- therapeutic. Agree with pharmacy recommendations as outlined in their note.     

## 2015-11-25 NOTE — Patient Instructions (Signed)
Patient educated about medication as defined in this encounter and verbalized understanding by repeating back instructions provided.   

## 2015-11-25 NOTE — Progress Notes (Addendum)
Anticoagulation Management Pamela [name] is a 70 y.o. female who reports to the clinic for monitoring of warfarin treatment.    Indication: DVT Duration: indefinite  Anticoagulation Clinic Visit History: Patient does not report signs/symptoms of bleeding or thromboembolism Other recent changes: none Anticoagulation Episode Summary    Current INR goal 2.0-3.0  Next INR check 12/30/2015  INR from last check 2.5 (11/25/2015)  Weekly max dose   Target end date Indefinite  [facility] check location [facility]  Preferred lab   Send INR reminders to [facility]   Indications  VTE (venous thromboembolism) [I82.90] DVT HX OF (Resolved) [Z86.718] Long term current use of anticoagulant [Z79.01]        Comments       Anticoagulation Care Providers    Provider Role Specialty Phone number   Burman Freestone, MD  Internal Medicine [phone]     ASSESSMENT Recent Results: The most recent result is correlated with 20 mg per week: Lab Results  Component Value Date   INR 2.5 11/25/2015   INR 1.90 10/28/2015   INR 2.20 09/30/2015    Anticoagulation Dosing: INR as of 11/25/2015 and Previous Dosing Information    INR Dt INR Goal [employer] Sun Mon Tue Wed Thu Fri Sat   11/25/2015 2.5 2.0-3.0 20 mg 2 mg 4 mg 2 mg 4 mg 2 mg 4 mg 2 mg    Previous description             Anticoagulation Dose Instructions as of 11/25/2015      Total Sun Mon Tue Wed Thu Fri Sat   New Dose 20 mg 2 mg 4 mg 2 mg 4 mg 2 mg 4 mg 2 mg     (4 mg x 0.5)  (4 mg x 1)  (4 mg x 0.5)  (4 mg x 1)  (4 mg x 0.5)  (4 mg x 1)  (4 mg x 0.5)                         Description               INR today: Therapeutic  PLAN Weekly dose was unchanged  Patient Instructions  Patient educated about medication as defined in this encounter and verbalized understanding by repeating back instructions provided.    Patient advised to contact clinic or seek medical attention if signs/symptoms of bleeding or  thromboembolism occur.  Patient verbalized understanding by repeating back information and was advised to contact me if further medication-related questions arise. Patient was also provided an information handout.  Follow-up No Follow-up on file.  Deontez Klinke J  15 minutes spent face-to-face with the patient during the encounter. 50% of time spent on education. 50% of time was spent on assessment and plan. Patient was seen in clinic by Tamala Julian, PharmD, PGY1 pharmacy resident. I agree with the assessment and plan of care documented.

## 2015-12-11 ENCOUNTER — Encounter: Payer: Self-pay | Admitting: Internal Medicine

## 2015-12-12 ENCOUNTER — Encounter: Payer: Self-pay | Admitting: Internal Medicine

## 2015-12-12 ENCOUNTER — Ambulatory Visit (INDEPENDENT_AMBULATORY_CARE_PROVIDER_SITE_OTHER): Payer: Medicare Other | Admitting: Internal Medicine

## 2015-12-12 VITALS — BP 136/70 | HR 73 | Temp 97.9°F | Ht 65.0 in | Wt 192.7 lb

## 2015-12-12 DIAGNOSIS — Z789 Other specified health status: Secondary | ICD-10-CM

## 2015-12-12 DIAGNOSIS — E039 Hypothyroidism, unspecified: Secondary | ICD-10-CM

## 2015-12-12 DIAGNOSIS — I10 Essential (primary) hypertension: Secondary | ICD-10-CM | POA: Diagnosis not present

## 2015-12-12 DIAGNOSIS — M8580 Other specified disorders of bone density and structure, unspecified site: Secondary | ICD-10-CM | POA: Diagnosis not present

## 2015-12-12 DIAGNOSIS — E66811 Obesity, class 1: Secondary | ICD-10-CM

## 2015-12-12 DIAGNOSIS — D689 Coagulation defect, unspecified: Secondary | ICD-10-CM | POA: Diagnosis not present

## 2015-12-12 DIAGNOSIS — Z79899 Other long term (current) drug therapy: Secondary | ICD-10-CM

## 2015-12-12 DIAGNOSIS — E669 Obesity, unspecified: Secondary | ICD-10-CM

## 2015-12-12 DIAGNOSIS — K909 Intestinal malabsorption, unspecified: Secondary | ICD-10-CM

## 2015-12-12 DIAGNOSIS — Z7901 Long term (current) use of anticoagulants: Secondary | ICD-10-CM

## 2015-12-12 DIAGNOSIS — Z8739 Personal history of other diseases of the musculoskeletal system and connective tissue: Secondary | ICD-10-CM

## 2015-12-12 DIAGNOSIS — K9089 Other intestinal malabsorption: Secondary | ICD-10-CM

## 2015-12-12 DIAGNOSIS — Z1159 Encounter for screening for other viral diseases: Secondary | ICD-10-CM

## 2015-12-12 DIAGNOSIS — Z0000 Encounter for general adult medical examination without abnormal findings: Secondary | ICD-10-CM

## 2015-12-12 DIAGNOSIS — Z6832 Body mass index (BMI) 32.0-32.9, adult: Secondary | ICD-10-CM

## 2015-12-12 DIAGNOSIS — K219 Gastro-esophageal reflux disease without esophagitis: Secondary | ICD-10-CM

## 2015-12-12 MED ORDER — LOSARTAN POTASSIUM 25 MG PO TABS: 25.0000 mg | ORAL_TABLET | Freq: Every day | ORAL | Status: DC

## 2015-12-12 MED ORDER — COLESTIPOL HCL 1 G PO TABS: 1.0000 g | ORAL_TABLET | Freq: Two times a day (BID) | ORAL | Status: DC

## 2015-12-12 NOTE — Progress Notes (Signed)
   Subjective:    Patient ID: Pamela [name], female    DOB: 22-Feb-1946, 71 y.o.   MRN: [id number]  HPI  Pamela [name] is here for thyroid mgmt. Please see the A&P for the status of the pt's chronic medical problems.  ROS : per ROS section and in problem oriented charting. All other systems are negative.  PMHx, Soc hx, and / or Fam hx : grandmother gad unilateral mastectomy in her 59's unknown reason. No known breast cancer in her family  Review of Systems  Constitutional: Positive for fatigue. Negative for unexpected weight change.  Respiratory: Negative for shortness of breath.   Cardiovascular: Negative for chest pain, palpitations and leg swelling.  Endocrine: Negative for polydipsia and polyuria.  Musculoskeletal:       Stiffness residual in R great toe after gout       Objective:   Physical Exam  Constitutional: She is oriented to person, place, and time. She appears well-developed and well-nourished. No distress.  HENT:  Head: Normocephalic and atraumatic.  Right Ear: External ear normal.  Left Ear: External ear normal.  Nose: Nose normal.  Wax in canal B R>L  Eyes: Conjunctivae and EOM are normal.  Neck: Normal range of motion.  Cardiovascular: Normal rate, regular rhythm and normal heart sounds.   Pulmonary/Chest: Breath sounds normal. No respiratory distress.  Musculoskeletal: She exhibits no edema or tenderness.  Trace edema on R  Neurological: She is alert and oriented to person, place, and time.  Skin: Skin is warm and dry. She is not diaphoretic.  Blanching R superficial capillaries RLE 2 cm protuberant, non tender area lower medial calf.  Psychiatric: She has a normal mood and affect. Her behavior is normal. Judgment and thought content normal.          Assessment & Plan:

## 2015-12-12 NOTE — Patient Instructions (Signed)
1. Have a good trip to [place] and to your conference 2. Try the losartan - will lower uric acid levels. If too $$, ask pharmacy if your insurance covers a different ARB 3. See me in one yr 4. I will mail you your labs

## 2015-12-12 NOTE — Assessment & Plan Note (Signed)
Not doing any sig aerobic activity. Encouraged her to do weight bearing exercise.

## 2015-12-12 NOTE — Assessment & Plan Note (Signed)
Taking her PPI daily. Night time cough is gone. No heartburn.  PLAN:  Cont current meds

## 2015-12-12 NOTE — Assessment & Plan Note (Signed)
Taking calcium and Vit D. Rec weight bearing exercise.

## 2015-12-12 NOTE — Assessment & Plan Note (Signed)
Had been on HCTZ 12.5 and BP good. Changed to lisinopril 10 when she had first gout attack. Cough is bed and interfering with her singing. We discussed options - ARB and norvasc - and elected losartan bc it will lower uric acid level. I am checking her BMP today. I sch her for one yr F/U - not great since I am making med change. Will send her letter with her labs and ask her to get her BP checked when she sees Dr Elie Confer.  PLAN : stop ACEI Losartan 25 QD BP check at warfarin clinic

## 2015-12-12 NOTE — Assessment & Plan Note (Addendum)
She will sch her own MMG. 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  Going to viist her sister in [place]. Then to conf in [place] women supporting and advocating for education of women and girls in [place].

## 2015-12-12 NOTE — Assessment & Plan Note (Signed)
She cont to see Dr Elie Confer and is on warfarin. No issues. No bleeding.  PLAN:  Cont current meds

## 2015-12-12 NOTE — Assessment & Plan Note (Signed)
She has a bit of stiffness left in R great toe but otherwise no pain. Since first attack and renal fxn nl, no prophylaxis needed. Off HCTZ. Start losartan for HTN tx. Knows to take NSAID if flares.

## 2015-12-12 NOTE — Assessment & Plan Note (Signed)
Hasnt required the colestid for yrs and her supply is expired. Likes to have on hand just in case. Her natural state is constipation esp when her routine gets interrupted with travel. If she eats out (thinks MSG is culprit) might get flare of D.   PLAN : refill colestid

## 2015-12-12 NOTE — Assessment & Plan Note (Signed)
She has no discrete sxs but does describe "low energy". She thinks the 88 mcg is spot on.  PLAN : cont synthroid 88 mcg TSH

## 2015-12-13 ENCOUNTER — Encounter: Payer: Self-pay | Admitting: Internal Medicine

## 2015-12-13 LAB — BMP8+ANION GAP
ANION GAP: 18 mmol/L (ref 10.0–18.0)
BUN/Creatinine Ratio: 17 (ref 12–28)
BUN: 15 mg/dL (ref 8–27)
CALCIUM: 9.6 mg/dL (ref 8.7–10.3)
CHLORIDE: 99 mmol/L (ref 96–106)
CO2: 24 mmol/L (ref 18–29)
Creatinine, Ser: 0.87 mg/dL (ref 0.57–1.00)
GFR calc Af Amer: 78 mL/min/{1.73_m2} (ref >59)
GFR calc non Af Amer: 67 mL/min/{1.73_m2} (ref >59)
GLUCOSE: 89 mg/dL (ref 65–99)
POTASSIUM: 4.6 mmol/L (ref 3.5–5.2)
Sodium: 141 mmol/L (ref 134–144)

## 2015-12-13 LAB — TSH: TSH: 1.39 u[IU]/mL (ref 0.450–4.500)

## 2015-12-13 LAB — HEPATITIS C ANTIBODY: Hep C Virus Ab: <0.1 s/co ratio (ref 0.0–0.9)

## 2015-12-30 ENCOUNTER — Ambulatory Visit (INDEPENDENT_AMBULATORY_CARE_PROVIDER_SITE_OTHER): Payer: Medicare Other | Admitting: Pharmacist

## 2015-12-30 ENCOUNTER — Encounter: Payer: Self-pay | Admitting: Pharmacist

## 2015-12-30 VITALS — BP 138/84 | HR 68

## 2015-12-30 DIAGNOSIS — Z7901 Long term (current) use of anticoagulants: Secondary | ICD-10-CM | POA: Diagnosis not present

## 2015-12-30 DIAGNOSIS — Z86718 Personal history of other venous thrombosis and embolism: Secondary | ICD-10-CM | POA: Diagnosis not present

## 2015-12-30 DIAGNOSIS — D689 Coagulation defect, unspecified: Secondary | ICD-10-CM

## 2015-12-30 LAB — POCT INR: INR: 2.4

## 2015-12-30 NOTE — Patient Instructions (Signed)
Patient instructed to take medications as defined in the Anti-coagulation Track section of this encounter.  Patient instructed to take today's dose.  Patient verbalized understanding of these instructions.    

## 2015-12-30 NOTE — Progress Notes (Signed)
Anti-Coagulation Progress Note  Pamela [name] is a 70 y.o. female who is currently on an anti-coagulation regimen.    RECENT RESULTS: Recent results are below, the most recent result is correlated with a dose of 20 mg. per week: Lab Results  Component Value Date   INR 2.40 12/30/2015   INR 2.5 11/25/2015   INR 1.90 10/28/2015    ANTI-COAG DOSE: Anticoagulation Dose Instructions as of 12/30/2015      [name] Tue Wed Thu Fri Sat   New Dose 2 mg 4 mg 2 mg 4 mg 2 mg 4 mg 2 mg    Description                ANTICOAG SUMMARY: Anticoagulation Episode Summary    Current INR goal 2.0-3.0  Next INR check 02/03/2016  INR from last check 2.40 (12/30/2015)  Weekly max dose   Target end date Indefinite  [facility]  Preferred lab   Send INR reminders to [facility]   Indications  Clotting disorder ([place]) [D68.9] DVT HX OF (Resolved) [id number]        Comments       Anticoagulation Care Providers    Provider Role Specialty Phone number   Burman Freestone, MD  Internal Medicine [phone]      ANTICOAG TODAY: Anticoagulation Summary as of 12/30/2015    INR goal 2.0-3.0  Selected INR 2.40 (12/30/2015)  Next INR check 02/03/2016  Target end date Indefinite   Indications  Clotting disorder ([place]) [D68.9] DVT HX OF (Resolved) [Z86.718]      Anticoagulation Episode Summary    [facility] check [facility]   Preferred lab    Send INR reminders to [facility]   Comments     Anticoagulation Care Providers    Provider Role Specialty Phone number   Burman Freestone, MD  Internal Medicine [phone]      PATIENT INSTRUCTIONS: Patient Instructions  Patient instructed to take medications as defined in the Anti-coagulation Track section of this encounter.  Patient instructed to take today's dose.  Patient verbalized understanding of these instructions.       FOLLOW-UP Return in 5 weeks (on 02/03/2016) for Follow up INR at 1000h.  [name], III Pharm.D., CACP

## 2016-02-03 ENCOUNTER — Ambulatory Visit (INDEPENDENT_AMBULATORY_CARE_PROVIDER_SITE_OTHER): Payer: Medicare Other | Admitting: Pharmacist

## 2016-02-03 DIAGNOSIS — Z7901 Long term (current) use of anticoagulants: Secondary | ICD-10-CM | POA: Diagnosis not present

## 2016-02-03 DIAGNOSIS — D689 Coagulation defect, unspecified: Secondary | ICD-10-CM | POA: Diagnosis not present

## 2016-02-03 LAB — POCT INR: INR: 3.1

## 2016-02-03 NOTE — Progress Notes (Signed)
Anticoagulation Management Pamela [name] is a 70 y.o. female who reports to the clinic for monitoring of warfarin treatment.    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 (02/03/2016)  Weekly max dose:     Target end date:   Indefinite  [facility]  Preferred lab:     Send INR reminders to:   [facility]   Indications   Clotting disorder ([place]) [D68.9] DVT HX OF (Resolved) [id number]       Comments:         Anticoagulation Care Providers    Provider Role Specialty Phone number   Burman Freestone, MD  Internal Medicine [phone]     ASSESSMENT Recent Results: The most recent result is correlated with 20 mg per week: Lab Results  Component Value Date   INR 3.1 02/03/2016   INR 2.40 12/30/2015   INR 2.5 11/25/2015    Anticoagulation Dosing: INR as of 02/03/2016 and Previous Dosing Information    INR Dt INR Goal [employer] Sun Mon Tue Wed Thu Fri Sat   02/03/2016 3.1 2.0-3.0 20 mg 2 mg 4 mg 2 mg 4 mg 2 mg 4 mg 2 mg    Previous description       Anticoagulation Dose Instructions as of 02/03/2016      Total Sun Mon Tue Wed Thu Fri Sat   New Dose 20 mg 2 mg 4 mg 2 mg 4 mg 2 mg 4 mg 2 mg     (4 mg x 0.5)  (4 mg x 1)  (4 mg x 0.5)  (4 mg x 1)  (4 mg x 0.5)  (4 mg x 1)  (4 mg x 0.5)                         Description         INR today: slightly Supratherapeutic  PLAN Weekly dose was unchanged due to being on this dose since April. Will consider changing dose at future appointment if not therapeutic. Patient was advised to contact clinic or seek medical attention if signs/symptoms of bleeding or thromboembolism  occur.  Patient Instructions  Patient educated about medication as defined in this encounter and verbalized understanding by repeating back instructions provided.   Patient verbalized understanding by repeating back information and was advised to contact me if further medication-related questions arise. Patient was also provided an information handout.  Follow-up Return in about 4 weeks (around 03/02/2016) for Follow up INR 03/02/2016 around 10am.  Pamela [name] J  15 minutes spent face-to-face with the patient during the encounter. 50% of time spent on education. 50% of time was spent on assessment and plan.

## 2016-02-03 NOTE — Patient Instructions (Signed)
Patient educated about medication as defined in this encounter and verbalized understanding by repeating back instructions provided.   

## 2016-02-13 NOTE — Progress Notes (Signed)
I have reviewed Dr. Julianne Rice note.  Patient is on [facility] for a factor deficiency and PE.  INR was slightly above goal.  Coumadin dose unchanged, but plan for close monitoring.

## 2016-03-02 ENCOUNTER — Ambulatory Visit (INDEPENDENT_AMBULATORY_CARE_PROVIDER_SITE_OTHER): Payer: Medicare Other | Admitting: Pharmacist

## 2016-03-02 DIAGNOSIS — Z7901 Long term (current) use of anticoagulants: Secondary | ICD-10-CM | POA: Diagnosis not present

## 2016-03-02 DIAGNOSIS — D689 Coagulation defect, unspecified: Secondary | ICD-10-CM | POA: Diagnosis not present

## 2016-03-02 LAB — POCT INR: INR: 2.7

## 2016-03-02 NOTE — Progress Notes (Signed)
Anti-Coagulation Progress Note  Pamela [name] is a 70 y.o. female who is currently on an anti-coagulation regimen.    RECENT RESULTS: Recent results are below, the most recent result is correlated with a dose of 20 mg. per week: Lab Results  Component Value Date   INR 2.70 03/02/2016   INR 3.1 02/03/2016   INR 2.40 12/30/2015    ANTI-COAG DOSE: Anticoagulation Dose Instructions as of 03/02/2016      [name] Tue Wed Thu Fri Sat   New Dose 2 mg 4 mg 2 mg 4 mg 2 mg 4 mg 2 mg    Description          ANTICOAG SUMMARY: Anticoagulation Episode Summary    Current INR goal:   2.0-3.0  TTR:   69.3 % (5.5 y)  Next INR check:   03/30/2016  INR from last check:   2.70 (03/02/2016)  Weekly max dose:     Target end date:   Indefinite  [facility]  Preferred lab:     Send INR reminders to:   [facility]   Indications   Clotting disorder ([place]) [D68.9] DVT HX OF (Resolved) [id number]       Comments:         Anticoagulation Care Providers    Provider Role Specialty Phone number   Burman Freestone, MD  Internal Medicine [phone]      ANTICOAG TODAY: Anticoagulation Summary  As of 03/02/2016   INR goal:   2.0-3.0  TTR:     Today's INR:   2.70  Next INR check:   03/30/2016  Target end date:   Indefinite   Indications   Clotting disorder ([place]) [D68.9] DVT HX OF (Resolved) [Z86.718]        Anticoagulation Episode Summary    [facility]   [facility]   Preferred lab:      Send INR reminders to:   [facility]   Comments:       Anticoagulation Care Providers    Provider Role Specialty Phone number   Burman Freestone, MD  Internal Medicine [phone]      PATIENT INSTRUCTIONS: There are no Patient Instructions on file for this visit.   FOLLOW-UP Return in 4 weeks (on 03/30/2016) for Follow up INR at 1000h.  [name], III Pharm.D., CACP

## 2016-03-02 NOTE — Patient Instructions (Signed)
Patient instructed to take medications as defined in the Anti-coagulation Track section of this encounter.  Patient instructed to take today's dose.  Patient verbalized understanding of these instructions.    

## 2016-03-17 ENCOUNTER — Other Ambulatory Visit: Payer: Self-pay | Admitting: Internal Medicine

## 2016-03-30 ENCOUNTER — Ambulatory Visit (INDEPENDENT_AMBULATORY_CARE_PROVIDER_SITE_OTHER): Payer: Medicare Other | Admitting: Pharmacist

## 2016-03-30 DIAGNOSIS — Z86718 Personal history of other venous thrombosis and embolism: Secondary | ICD-10-CM | POA: Diagnosis not present

## 2016-03-30 DIAGNOSIS — Z7901 Long term (current) use of anticoagulants: Secondary | ICD-10-CM

## 2016-03-30 DIAGNOSIS — D689 Coagulation defect, unspecified: Secondary | ICD-10-CM | POA: Diagnosis not present

## 2016-03-30 LAB — POCT INR: INR: 3.7

## 2016-03-30 NOTE — Patient Instructions (Signed)
Patient instructed to take medications as defined in the Anti-coagulation Track section of this encounter.  Patient instructed to take today's dose.  Patient verbalized understanding of these instructions.    

## 2016-03-30 NOTE — Progress Notes (Signed)
Anti-Coagulation Progress Note  Pamela [name] is a 70 y.o. female who is currently on an anti-coagulation regimen.    RECENT RESULTS: Recent results are below, the most recent result is correlated with a dose of 20 mg. per week: Lab Results  Component Value Date   INR 3.70 03/30/2016   INR 2.70 03/02/2016   INR 3.1 02/03/2016    ANTI-COAG DOSE: Anticoagulation Dose Instructions as of 03/30/2016      [name] Tue Wed Thu Fri Sat   New Dose 2 mg 2 mg 4 mg 2 mg 4 mg 2 mg 2 mg    Description          ANTICOAG SUMMARY: Anticoagulation Episode Summary    Current INR goal:   2.0-3.0  TTR:   68.8 % (5.6 y)  Next INR check:   04/27/2016  INR from last check:   3.70! (03/30/2016)  Weekly max dose:     Target end date:   Indefinite  [facility]  Preferred lab:     Send INR reminders to:   ANT[facility]AG [facility]   Indications   Clotting disorder ([place]) [D68.9] DVT HX OF (Resolved) [id number]       Comments:         Anticoagulation Care Providers    Provider Role Specialty Phone number   Burman Freestone, MD  Internal Medicine [phone]      ANTICOAG TODAY: Anticoagulation Summary  As of 03/30/2016   INR goal:   2.0-3.0  TTR:     Today's INR:   3.70!  Next INR check:   04/27/2016  Target end date:   Indefinite   Indications   Clotting disorder ([place]) [D68.9] DVT HX OF (Resolved) [Z86.718]        Anticoagulation Episode Summary    [facility] check [facility]   [facility]   Preferred lab:      Send INR reminders to:   ANT[facility]   Comments:       Anticoagulation Care Providers    Provider Role Specialty Phone number   Burman Freestone, MD  Internal Medicine [phone]      PATIENT INSTRUCTIONS: There are no Patient Instructions on file for this visit.   FOLLOW-UP Return in 4 weeks (on 04/27/2016) for Follow up INR at 1015h.  [name], III Pharm.D., CACP

## 2016-04-13 ENCOUNTER — Other Ambulatory Visit: Payer: Self-pay | Admitting: Obstetrics and Gynecology

## 2016-04-13 DIAGNOSIS — Z1231 Encounter for screening mammogram for malignant neoplasm of breast: Secondary | ICD-10-CM

## 2016-04-20 ENCOUNTER — Ambulatory Visit
Admission: RE | Admit: 2016-04-20 | Discharge: 2016-04-20 | Disposition: A | Discharge status: Home / Self Care | Payer: Medicare Other | Source: Ambulatory Visit | Attending: Obstetrics and Gynecology | Admitting: Obstetrics and Gynecology

## 2016-04-20 DIAGNOSIS — Z1231 Encounter for screening mammogram for malignant neoplasm of breast: Secondary | ICD-10-CM

## 2016-04-27 ENCOUNTER — Ambulatory Visit (INDEPENDENT_AMBULATORY_CARE_PROVIDER_SITE_OTHER): Payer: Medicare Other | Admitting: Pharmacist

## 2016-04-27 DIAGNOSIS — D689 Coagulation defect, unspecified: Secondary | ICD-10-CM

## 2016-04-27 DIAGNOSIS — Z86718 Personal history of other venous thrombosis and embolism: Secondary | ICD-10-CM

## 2016-04-27 DIAGNOSIS — Z7901 Long term (current) use of anticoagulants: Secondary | ICD-10-CM | POA: Diagnosis not present

## 2016-04-27 LAB — POCT INR: INR: 2.1

## 2016-04-27 NOTE — Progress Notes (Signed)
Anti-Coagulation Progress Note  Pamela FAILING is a 70 y.o. female who is currently on an anti-coagulation regimen.    RECENT RESULTS: Recent results are below, the most recent result is correlated with a dose of 18 mg. per week: Lab Results  Component Value Date   INR 2.10 04/27/2016   INR 3.70 03/30/2016   INR 2.70 03/02/2016    ANTI-COAG DOSE: Anticoagulation Dose Instructions as of 04/27/2016      [name] Tue Wed Thu Fri Sat   New Dose 2 mg 2 mg 4 mg 2 mg 4 mg 2 mg 2 mg    Description          ANTICOAG SUMMARY: Anticoagulation Episode Summary    Current INR goal:   2.0-3.0  TTR:   68.6 % (5.7 y)  Next INR check:   06/01/2016  INR from last check:   2.10 (04/27/2016)  Weekly max dose:     Target end date:   Indefinite  [facility]  Preferred lab:     Send INR reminders to:   ANT[facility]AG [facility]   Indications   Clotting disorder ([place]) [D68.9] DVT HX OF (Resolved) [id number]       Comments:         Anticoagulation Care Providers    Provider Role Specialty Phone number   Burman Freestone, MD  Internal Medicine [phone]      ANTICOAG TODAY: Anticoagulation Summary  As of 04/27/2016   INR goal:   2.0-3.0  TTR:     Today's INR:   2.10  Next INR check:   06/01/2016  Target end date:   Indefinite   Indications   Clotting disorder ([place]) [D68.9] DVT HX OF (Resolved) [Z86.718]        Anticoagulation Episode Summary    [facility] check [facility]   Preferred lab:      Send INR reminders to:   ANTICOAG IMP   Comments:       Anticoagulation Care Providers    Provider Role Specialty Phone number   Burman Freestone, MD  Internal Medicine [phone]      PATIENT INSTRUCTIONS: There are no Patient Instructions on file for this visit.   FOLLOW-UP Return in 5 weeks (on 06/01/2016) for Follow up INR at 1000h.  [name], III Pharm.D., CACP

## 2016-04-27 NOTE — Patient Instructions (Signed)
Patient instructed to take medications as defined in the Anti-coagulation Track section of this encounter.  Patient instructed to take today's dose.  Patient verbalized understanding of these instructions.    

## 2016-04-27 NOTE — Progress Notes (Signed)
INTERNAL MEDICINE TEACHING ATTENDING ADDENDUM - Lucious Groves, DO Duration- lifelong, Indication- DVT, clotting disorder, INR-  Lab Results  Component Value Date   INR 2.10 04/27/2016  . Agree with pharmacy recommendations as outlined in their note.

## 2016-05-21 ENCOUNTER — Other Ambulatory Visit: Payer: Self-pay | Admitting: Internal Medicine

## 2016-06-01 ENCOUNTER — Ambulatory Visit (INDEPENDENT_AMBULATORY_CARE_PROVIDER_SITE_OTHER): Payer: Medicare Other | Admitting: Pharmacist

## 2016-06-01 DIAGNOSIS — D689 Coagulation defect, unspecified: Secondary | ICD-10-CM | POA: Diagnosis not present

## 2016-06-01 DIAGNOSIS — Z7901 Long term (current) use of anticoagulants: Secondary | ICD-10-CM

## 2016-06-01 LAB — POCT INR: INR: 2.2

## 2016-06-01 NOTE — Progress Notes (Signed)
Anti-Coagulation Progress Note  Pamela [name] is a 70 y.o. female who is currently on an anti-coagulation regimen.    RECENT RESULTS: Recent results are below, the most recent result is correlated with a dose of 18 mg. per week: Lab Results  Component Value Date   INR 2.20 06/01/2016   INR 2.10 04/27/2016   INR 3.70 03/30/2016    ANTI-COAG DOSE: Anticoagulation Dose Instructions as of 06/01/2016      [name] Tue Wed Thu Fri Sat   New Dose 2 mg 2 mg 4 mg 2 mg 4 mg 2 mg 2 mg    Description   Take 1 tablet on Tuesdays and Thursdays--all other days, take only 1/2 of your blue 4mg  strength warfarin tablet(s).       ANTICOAG SUMMARY: Anticoagulation Episode Summary    Current INR goal:   2.0-3.0  TTR:   69.1 % (5.8 y)  Next INR check:   07/13/2016  INR from last check:   2.20 (06/01/2016)  Weekly max dose:     Target end date:   Indefinite  [facility]  Preferred lab:     Send INR reminders to:   ANTICOAG IMP   Indications   Clotting disorder ([place]) [D68.9] DVT HX OF (Resolved) [id number]       Comments:         Anticoagulation Care Providers    Provider Role Specialty Phone number   Burman Freestone, MD  Internal Medicine [phone]      ANTICOAG TODAY: Anticoagulation Summary  As of 06/01/2016   INR goal:   2.0-3.0  TTR:     Today's INR:   2.20  Next INR check:   07/13/2016  Target end date:   Indefinite   Indications   Clotting disorder ([place]) [D68.9] DVT HX OF (Resolved) [Z86.718]        Anticoagulation Episode Summary    [facility] check [facility]   [facility]   Preferred lab:      Send INR reminders to:   ANTICOAG IMP   Comments:       Anticoagulation Care Providers    Provider Role Specialty Phone number   Burman Freestone, MD  Internal Medicine [phone]      PATIENT INSTRUCTIONS: Patient instructed to take medications as defined in the Anti-coagulation Track section of this encounter.  Patient instructed to  take today's dose.  Patient instructed to take 1 tablet on Tuesdays and Thursdays--all other days, take only 1/2 of your blue 4mg  strength warfarin tablet(s).  Patient verbalized understanding of these instructions.      FOLLOW-UP Return in 6 weeks (on 07/13/2016) for Follow up INR at 1000h.  [name], III Pharm.D., CACP

## 2016-06-01 NOTE — Patient Instructions (Signed)
Patient instructed to take medications as defined in the Anti-coagulation Track section of this encounter.  Patient instructed to take today's dose.  Patient instructed to take 1 tablet on Tuesdays and Thursdays--all other days, take only 1/2 of your blue 4mg  strength warfarin tablet(s).  Patient verbalized understanding of these instructions.

## 2016-06-03 NOTE — Progress Notes (Signed)
INTERNAL MEDICINE TEACHING ATTENDING ADDENDUM - Lucious Groves, DO Duration- indefinate, Indication- vte, INR-  Lab Results  Component Value Date   INR 2.20 06/01/2016  . Agree with pharmacy recommendations as outlined in their note.

## 2016-07-01 ENCOUNTER — Other Ambulatory Visit: Payer: Self-pay | Admitting: Internal Medicine

## 2016-07-13 ENCOUNTER — Ambulatory Visit (INDEPENDENT_AMBULATORY_CARE_PROVIDER_SITE_OTHER): Payer: Medicare Other | Admitting: Pharmacist

## 2016-07-13 DIAGNOSIS — Z86718 Personal history of other venous thrombosis and embolism: Secondary | ICD-10-CM

## 2016-07-13 DIAGNOSIS — Z7901 Long term (current) use of anticoagulants: Secondary | ICD-10-CM | POA: Diagnosis not present

## 2016-07-13 DIAGNOSIS — D689 Coagulation defect, unspecified: Secondary | ICD-10-CM

## 2016-07-13 LAB — POCT INR: INR: 2.5

## 2016-07-13 NOTE — Progress Notes (Signed)
Anti-Coagulation Progress Note  Pamela [name] is a 71 y.o. female who is currently on an anti-coagulation regimen.    RECENT RESULTS: Recent results are below, the most recent result is correlated with a dose of 18 mg. per week: Lab Results  Component Value Date   INR 2.50 07/13/2016   INR 2.20 06/01/2016   INR 2.10 04/27/2016    ANTI-COAG DOSE: Anticoagulation Dose Instructions as of 07/13/2016      [name] Tue Wed Thu Fri Sat   New Dose 2 mg 2 mg 4 mg 2 mg 4 mg 2 mg 2 mg    Description   Take 1 tablet on Tuesdays and Thursdays--all other days, take only 1/2 of your blue 4mg  strength warfarin tablet(s).       ANTICOAG SUMMARY: Anticoagulation Episode Summary    Current INR goal:   2.0-3.0  TTR:   69.7 % (5.9 y)  Next INR check:   08/17/2016  INR from last check:   2.50 (07/13/2016)  Weekly max dose:     Target end date:   Indefinite  [facility]  Preferred lab:     Send INR reminders to:   ANTICOAG IMP   Indications   Clotting disorder ([place]) [D68.9] DVT HX OF (Resolved) [id number]       Comments:         Anticoagulation Care Providers    Provider Role Specialty Phone number   Burman Freestone, MD  Internal Medicine [phone]      ANTICOAG TODAY: Anticoagulation Summary  As of 07/13/2016   INR goal:   2.0-3.0  TTR:     Today's INR:   2.50  Next INR check:   08/17/2016  Target end date:   Indefinite   Indications   Clotting disorder ([place]) [D68.9] DVT HX OF (Resolved) [Z86.718]        Anticoagulation Episode Summary    [facility] check [facility]   [facility]   Preferred lab:      Send INR reminders to:   ANTICOAG IMP   Comments:       Anticoagulation Care Providers    Provider Role Specialty Phone number   Burman Freestone, MD  Internal Medicine [phone]      PATIENT INSTRUCTIONS: Patient instructed to take medications as defined in the Anti-coagulation Track section of this encounter.  Patient instructed to take  today's dose.  Patient instructed to take 1 tablet on Tuesdays and Thursdays--all other days, take 1/2 tablet of your blue 4mg  strength warfarin tablets.  Patient verbalized understanding of these instructions.      FOLLOW-UP Return in 5 weeks (on 08/17/2016) for Follow up INR at 1000h.  [name], III Pharm.D., CACP

## 2016-07-13 NOTE — Patient Instructions (Signed)
Patient instructed to take medications as defined in the Anti-coagulation Track section of this encounter.  Patient instructed to take today's dose.  Patient instructed to take 1 tablet on Tuesdays and Thursdays--all other days, take 1/2 tablet of your blue 4mg  strength warfarin tablets.  Patient verbalized understanding of these instructions.

## 2016-07-14 NOTE — Progress Notes (Signed)
INTERNAL MEDICINE TEACHING ATTENDING ADDENDUM - Lucious Groves, DO Duration- indefinate, Indication- DVT, INR-  Lab Results  Component Value Date   INR 2.50 07/13/2016  . Agree with pharmacy recommendations as outlined in their note.

## 2016-08-17 ENCOUNTER — Ambulatory Visit (INDEPENDENT_AMBULATORY_CARE_PROVIDER_SITE_OTHER): Payer: Medicare Other | Admitting: Pharmacist

## 2016-08-17 DIAGNOSIS — Z86718 Personal history of other venous thrombosis and embolism: Secondary | ICD-10-CM | POA: Diagnosis not present

## 2016-08-17 DIAGNOSIS — D689 Coagulation defect, unspecified: Secondary | ICD-10-CM

## 2016-08-17 DIAGNOSIS — Z7901 Long term (current) use of anticoagulants: Secondary | ICD-10-CM

## 2016-08-17 LAB — POCT INR: INR: 1.9

## 2016-08-17 NOTE — Progress Notes (Signed)
Anti-Coagulation Progress Note  Pamela [name] is a 71 y.o. female who is currently on an anti-coagulation regimen.    RECENT RESULTS: Recent results are below, the most recent result is correlated with a dose of 18 mg. per week: Lab Results  Component Value Date   INR 1.90 08/17/2016   INR 2.50 07/13/2016   INR 2.20 06/01/2016    ANTI-COAG DOSE: Anticoagulation Dose Instructions as of 08/17/2016      [name] Tue Wed Thu Fri Sat   New Dose 2 mg 4 mg 4 mg 4 mg 2 mg 4 mg 2 mg    Description   Take 1 tablet on Tuesdays and Thursdays--all other days, take only 1/2 of your blue 4mg  strength warfarin tablet(s).       ANTICOAG SUMMARY: Anticoagulation Episode Summary    Current INR goal:   2.0-3.0  TTR:   69.9 % (6 y)  Next INR check:   09/14/2016  INR from last check:   1.90! (08/17/2016)  Weekly max dose:     Target end date:   Indefinite  [facility]  Preferred lab:     Send INR reminders to:   ANTICOAG IMP   Indications   Clotting disorder ([place]) [D68.9] DVT HX OF (Resolved) [id number]       Comments:         Anticoagulation Care Providers    Provider Role Specialty Phone number   Burman Freestone, MD  Internal Medicine [phone]      ANTICOAG TODAY: Anticoagulation Summary  As of 08/17/2016   INR goal:   2.0-3.0  TTR:     Today's INR:   1.90!  Next INR check:   09/14/2016  Target end date:   Indefinite   Indications   Clotting disorder ([place]) [D68.9] DVT HX OF (Resolved) [Z86.718]        Anticoagulation Episode Summary    [facility] check [facility]   [facility]   Preferred lab:      Send INR reminders to:   ANTICOAG IMP   Comments:       Anticoagulation Care Providers    Provider Role Specialty Phone number   Burman Freestone, MD  Internal Medicine [phone]      PATIENT INSTRUCTIONS: Patient instructed to take medications as defined in the Anti-coagulation Track section of this encounter.  Patient instructed to  take today's dose.  Patient instructed to take 1 tablet on Mondays, Tuesdays, Wednesdays and Fridays; all other days--take only 1/2 tablet.  Patient verbalized understanding of these instructions.     FOLLOW-UP Return in 4 weeks (on 09/14/2016) for Follow up INR at 1000.  [name], III Pharm.D., CACP

## 2016-08-17 NOTE — Patient Instructions (Signed)
Patient instructed to take medications as defined in the Anti-coagulation Track section of this encounter.  Patient instructed to take today's dose.  Patient instructed to take 1 tablet on Mondays, Tuesdays, Wednesdays and Fridays; all other days--take only 1/2 tablet.  Patient verbalized understanding of these instructions.

## 2016-08-18 NOTE — Progress Notes (Signed)
INTERNAL MEDICINE TEACHING ATTENDING ADDENDUM - Lucious Groves, DO Duration- indefinate, Indication- DVT, INR-  Lab Results  Component Value Date   INR 1.90 08/17/2016  . Agree with pharmacy recommendations as outlined in their note.

## 2016-09-14 ENCOUNTER — Ambulatory Visit (INDEPENDENT_AMBULATORY_CARE_PROVIDER_SITE_OTHER): Payer: Medicare Other | Admitting: Pharmacist

## 2016-09-14 DIAGNOSIS — Z7901 Long term (current) use of anticoagulants: Secondary | ICD-10-CM

## 2016-09-14 DIAGNOSIS — Z86718 Personal history of other venous thrombosis and embolism: Secondary | ICD-10-CM | POA: Diagnosis not present

## 2016-09-14 DIAGNOSIS — D689 Coagulation defect, unspecified: Secondary | ICD-10-CM

## 2016-09-14 LAB — POCT INR: INR: 3.7

## 2016-09-14 NOTE — Patient Instructions (Signed)
Patient instructed to take medications as defined in the Anti-coagulation Track section of this encounter.  Patient instructed to take today's dose.  Patient instructed to take 1 tablet of your 4mg  blue-colored warfarin tablets on Monday, Wednesday and Friday; on all other days--take only 1/2 tablet.  Patient verbalized understanding of these instructions.

## 2016-09-14 NOTE — Progress Notes (Signed)
Indication: Recurrent venous thromboembolism. Duration: Indefinite. INR: Above target. Agree with Dr. Gladstone Pih assessment and plan.

## 2016-09-14 NOTE — Progress Notes (Signed)
Anti-Coagulation Progress Note  Pamela [name] is a 71 y.o. female who is currently on an anti-coagulation regimen.    RECENT RESULTS: Recent results are below, the most recent result is correlated with a dose of 22 mg. per week: Lab Results  Component Value Date   INR 3.70 09/14/2016   INR 1.90 08/17/2016   INR 2.50 07/13/2016    ANTI-COAG DOSE: Anticoagulation Dose Instructions as of 09/14/2016      [name] Tue Wed Thu Fri Sat   New Dose 2 mg 4 mg 2 mg 4 mg 2 mg 4 mg 2 mg    Description   Take 1 tablet of your 4mg  blue colored warfarin tablets by mouth once-daily on Mondays, Wednesdays and Fridays. On Sundays, Tuesdays, Thursdays and Saturdays--take only 1/2 tablet.       ANTICOAG SUMMARY: Anticoagulation Episode Summary    Current INR goal:   2.0-3.0  TTR:   69.8 % (6 y)  Next INR check:   10/12/2016  INR from last check:   3.70! (09/14/2016)  Weekly max dose:     Target end date:   Indefinite  [facility]  Preferred lab:     Send INR reminders to:   ANTICOAG IMP   Indications   Clotting disorder ([place]) [D68.9] DVT HX OF (Resolved) [R94.585]       Comments:         Anticoagulation Care Providers    Provider Role Specialty Phone number   Burman Freestone, MD  Internal Medicine [phone]      ANTICOAG TODAY: Anticoagulation Summary  As of 09/14/2016   INR goal:   2.0-3.0  TTR:     Today's INR:   3.70!  Next INR check:   10/12/2016  Target end date:   Indefinite   Indications   Clotting disorder ([place]) [D68.9] DVT HX OF (Resolved) [Z86.718]        Anticoagulation Episode Summary    [facility] check [facility]   [facility]   Preferred lab:      Send INR reminders to:   ANTICOAG IMP   Comments:       Anticoagulation Care Providers    Provider Role Specialty Phone number   Burman Freestone, MD  Internal Medicine [phone]      PATIENT INSTRUCTIONS: Patient Instructions  Patient instructed to take medications as defined  in the Anti-coagulation Track section of this encounter.  Patient instructed to take today's dose.  Patient instructed to take 1 tablet of your 4mg  blue-colored warfarin tablets on Monday, Wednesday and Friday; on all other days--take only 1/2 tablet.  Patient verbalized understanding of these instructions.      FOLLOW-UP Return in 4 weeks (on 10/12/2016) for Follow up INR at 1015h.  [name], III Pharm.D., CACP

## 2016-09-25 ENCOUNTER — Other Ambulatory Visit: Payer: Self-pay | Admitting: Internal Medicine

## 2016-10-12 ENCOUNTER — Ambulatory Visit (INDEPENDENT_AMBULATORY_CARE_PROVIDER_SITE_OTHER): Payer: Medicare Other | Admitting: Pharmacist

## 2016-10-12 DIAGNOSIS — Z7901 Long term (current) use of anticoagulants: Secondary | ICD-10-CM | POA: Diagnosis not present

## 2016-10-12 DIAGNOSIS — D689 Coagulation defect, unspecified: Secondary | ICD-10-CM | POA: Diagnosis not present

## 2016-10-12 LAB — POCT INR: INR: 2.6

## 2016-10-12 NOTE — Progress Notes (Signed)
Anti-Coagulation Progress Note  Pamela [name] is a 71 y.o. female who is currently on an anti-coagulation regimen.    RECENT RESULTS: Recent results are below, the most recent result is correlated with a dose of 20 mg. per week: Lab Results  Component Value Date   INR 2.60 10/12/2016   INR 3.70 09/14/2016   INR 1.90 08/17/2016    ANTI-COAG DOSE: Anticoagulation Dose Instructions as of 10/12/2016      [name] Tue Wed Thu Fri Sat   New Dose 2 mg 4 mg 2 mg 4 mg 2 mg 4 mg 2 mg    Description   Take 1 tablet of your 4mg  blue colored warfarin tablets by mouth once-daily on Mondays, Wednesdays and Fridays. On Sundays, Tuesdays, Thursdays and Saturdays--take only 1/2 tablet.       ANTICOAG SUMMARY: Anticoagulation Episode Summary    Current INR goal:   2.0-3.0  TTR:   69.3 % (6.1 y)  Next INR check:   11/09/2016  INR from last check:   2.60 (10/12/2016)  Weekly max dose:     Target end date:   Indefinite  [facility]   [facility]  Preferred lab:     Send INR reminders to:   ANTICOAG IMP   Indications   Clotting disorder ([place]) [D68.9] DVT HX OF (Resolved) [V67.209]       Comments:         Anticoagulation Care Providers    Provider Role Specialty Phone number   Burman Freestone, MD  Internal Medicine [phone]      ANTICOAG TODAY: Anticoagulation Summary  As of 10/12/2016   INR goal:   2.0-3.0  TTR:     Today's INR:   2.60  Next INR check:   11/09/2016  Target end date:   Indefinite   Indications   Clotting disorder ([place]) [D68.9] DVT HX OF (Resolved) [Z86.718]        Anticoagulation Episode Summary    [facility] check [facility]   [facility]   Preferred lab:      Send INR reminders to:   ANTICOAG IMP   Comments:       Anticoagulation Care Providers    Provider Role Specialty Phone number   Burman Freestone, MD  Internal Medicine [phone]      PATIENT INSTRUCTIONS: Patient Instructions  Patient instructed to take medications as defined in  the Anti-coagulation Track section of this encounter.  Patient instructed to take today's dose.  Patient instructed to take 1 tablet of your 4mg  blue colored warfarin tablets by mouth once-daily on Mondays, Wednesdays and Fridays. On Sundays, Tuesdays, Thursdays and Saturdays--take only 1/2 tablet.  Patient verbalized understanding of these instructions.       FOLLOW-UP Return in 4 weeks (on 11/09/2016) for Follow up INR at 1015h.  [name], III Pharm.D., CACP

## 2016-10-12 NOTE — Progress Notes (Signed)
Indication: Recurrent deep venous thromboses. Duration: Indefinite. INR: At target. Agree with Dr. Groce's assessment and plan. 

## 2016-10-12 NOTE — Patient Instructions (Signed)
Patient instructed to take medications as defined in the Anti-coagulation Track section of this encounter.  Patient instructed to take today's dose.  Patient instructed to take 1 tablet of your 4mg  blue colored warfarin tablets by mouth once-daily on Mondays, Wednesdays and Fridays. On Sundays, Tuesdays, Thursdays and Saturdays--take only 1/2 tablet.  Patient verbalized understanding of these instructions.

## 2016-11-02 ENCOUNTER — Other Ambulatory Visit: Payer: Self-pay | Admitting: Internal Medicine

## 2016-11-02 ENCOUNTER — Encounter: Payer: Self-pay | Admitting: Internal Medicine

## 2016-11-02 ENCOUNTER — Telehealth: Payer: Self-pay

## 2016-11-02 MED ORDER — NAPROXEN 500 MG PO TABS: 500.0000 mg | ORAL_TABLET | Freq: Two times a day (BID) | ORAL | 0 refills | Status: DC | PRN

## 2016-11-02 MED ORDER — WARFARIN SODIUM 4 MG PO TABS: ORAL_TABLET | ORAL | 11 refills | Status: DC

## 2016-11-02 NOTE — Telephone Encounter (Signed)
Called pt to clarify indomethacin - what is indication, is she taking it with food, still taking naproxen? Concern due to warfarin use. Got answering machine.

## 2016-11-02 NOTE — Telephone Encounter (Signed)
Called pt again - got voicemail. No message left

## 2016-11-02 NOTE — Telephone Encounter (Signed)
Need to speak with a nurse about a letter for travel.

## 2016-11-02 NOTE — Telephone Encounter (Signed)
Patient travelling to [place] May 17th, requesting a letter containing all meds that she takes. Also asking to add in the list Indomethacin 75 mg capsule ( take 1 cap once a day for 5 days with food).  Has [facility] clinic appt 5/7 & can pick up letter on that day. Thanks!

## 2016-11-03 NOTE — Telephone Encounter (Signed)
Called pt - got voice mail.

## 2016-11-03 NOTE — Telephone Encounter (Signed)
Please call pt back.

## 2016-11-06 ENCOUNTER — Encounter: Payer: Self-pay | Admitting: Internal Medicine

## 2016-11-06 MED ORDER — INDOMETHACIN 25 MG PO CAPS: ORAL_CAPSULE | ORAL | Status: DC

## 2016-11-06 NOTE — Telephone Encounter (Signed)
Spoke to patient thru her cel #, she stated indomethacin was prescribed by her podiatrist as needed for flare up. She is not taking it @ this time. She is also not taking naproxen (only takes it as needed) She verbalized " I'm aware that I'm not suppose to take them at the same time. My doctor has told me about the reactions and also since I'm taking blood thinner"

## 2016-11-06 NOTE — Telephone Encounter (Signed)
I am bringing down letter

## 2016-11-09 ENCOUNTER — Ambulatory Visit: Payer: Medicare Other

## 2016-11-09 ENCOUNTER — Ambulatory Visit (INDEPENDENT_AMBULATORY_CARE_PROVIDER_SITE_OTHER): Payer: Medicare Other | Admitting: Pharmacist

## 2016-11-09 DIAGNOSIS — Z7901 Long term (current) use of anticoagulants: Secondary | ICD-10-CM | POA: Diagnosis not present

## 2016-11-09 DIAGNOSIS — D689 Coagulation defect, unspecified: Secondary | ICD-10-CM

## 2016-11-09 DIAGNOSIS — Z86718 Personal history of other venous thrombosis and embolism: Secondary | ICD-10-CM | POA: Diagnosis not present

## 2016-11-09 LAB — POCT INR: INR: 3.1

## 2016-11-09 NOTE — Progress Notes (Signed)
Anti-Coagulation Progress Note  Pamela [name] is a 71 y.o. female who is currently on an anti-coagulation regimen for clotting disorder ([place]) [D68.9], DVT, history of (resolved) [Z86.718]:   RECENT RESULTS: Recent results are below, the most recent result is correlated with a dose of 20 mg. per week: Lab Results  Component Value Date   INR 3.10 11/09/2016   INR 2.60 10/12/2016   INR 3.70 09/14/2016    ANTI-COAG DOSE: Anticoagulation Dose Instructions as of 11/09/2016      [name] Tue Wed Thu Fri Sat   New Dose 2 mg 4 mg 2 mg 2 mg 4 mg 2 mg 2 mg    Description   Take 1 tablet of your 4mg  blue colored warfarin tablets by mouth once-daily on Mondays and Thursdays--all other days, take only 1/2 tablet.       ANTICOAG SUMMARY: Anticoagulation Episode Summary    Current INR goal:   2.0-3.0  TTR:   69.5 % (6.2 y)  Next INR check:   12/14/2016  INR from last check:   3.10! (11/09/2016)  Weekly max dose:     Target end date:   Indefinite  [facility]   [facility]  Preferred lab:     Send INR reminders to:   ANTICOAG IMP   Indications   Clotting disorder ([place]) [D68.9] DVT HX OF (Resolved) [P10.315]       Comments:         Anticoagulation Care Providers    Provider Role Specialty Phone number   Burman Freestone, MD  Internal Medicine [phone]      ANTICOAG TODAY: Anticoagulation Summary  As of 11/09/2016   INR goal:   2.0-3.0  TTR:     Today's INR:   3.10!  Next INR check:   12/14/2016  Target end date:   Indefinite   Indications   Clotting disorder ([place]) [D68.9] DVT HX OF (Resolved) [Z86.718]        Anticoagulation Episode Summary    [facility] check [facility]   [facility]   Preferred lab:      Send INR reminders to:   ANTICOAG IMP   Comments:       Anticoagulation Care Providers    Provider Role Specialty Phone number   Burman Freestone, MD  Internal Medicine [phone]      PATIENT INSTRUCTIONS: Patient Instructions  Patient  instructed to take medications as defined in the Anti-coagulation Track section of this encounter.  Patient instructed to take today's dose.  Patient instructed to take 1 tablet of your 4mg  blue colored warfarin tablets on Mondays and Thursdays---all other days, take only 1/2 tablet.  Patient verbalized understanding of these instructions.       FOLLOW-UP Return in 5 weeks (on 12/14/2016) for Follow up INR at 1015h.  [name], III Pharm.D., CACP

## 2016-11-09 NOTE — Patient Instructions (Signed)
Patient instructed to take medications as defined in the Anti-coagulation Track section of this encounter.  Patient instructed to take today's dose.  Patient instructed to take 1 tablet of your 4mg  blue colored warfarin tablets on Mondays and Thursdays---all other days, take only 1/2 tablet.  Patient verbalized understanding of these instructions.

## 2016-11-10 NOTE — Progress Notes (Signed)
INTERNAL MEDICINE TEACHING ATTENDING ADDENDUM - Lucious Groves, DO Duration- indefinate, Indication- recurrent VTE, INR-  Lab Results  Component Value Date   INR 3.10 11/09/2016  . Agree with pharmacy recommendations as outlined in their note.

## 2016-12-02 ENCOUNTER — Other Ambulatory Visit: Payer: Self-pay | Admitting: Internal Medicine

## 2016-12-10 ENCOUNTER — Ambulatory Visit: Payer: Medicare Other | Admitting: Internal Medicine

## 2016-12-10 ENCOUNTER — Encounter: Payer: Self-pay | Admitting: Internal Medicine

## 2016-12-10 ENCOUNTER — Other Ambulatory Visit: Payer: Self-pay | Admitting: Internal Medicine

## 2016-12-10 MED ORDER — LEVOTHYROXINE SODIUM 88 MCG PO TABS: ORAL_TABLET | ORAL | 3 refills | Status: DC

## 2016-12-14 ENCOUNTER — Ambulatory Visit (INDEPENDENT_AMBULATORY_CARE_PROVIDER_SITE_OTHER): Payer: Medicare Other | Admitting: Pharmacist

## 2016-12-14 DIAGNOSIS — D689 Coagulation defect, unspecified: Secondary | ICD-10-CM

## 2016-12-14 DIAGNOSIS — Z7901 Long term (current) use of anticoagulants: Secondary | ICD-10-CM | POA: Diagnosis not present

## 2016-12-14 LAB — POCT INR: INR: 2.2

## 2016-12-14 NOTE — Patient Instructions (Signed)
Patient instructed to take medications as defined in the Anti-coagulation Track section of this encounter.  Patient instructed to take today's dose.  Patient instructed to take 1 tablet of your 4mg blue colored warfarin tablets by mouth once-daily on Mondays and Thursdays--all other days, take only 1/2 tablet.  Patient verbalized understanding of these instructions.    

## 2016-12-14 NOTE — Progress Notes (Signed)
Anticoagulation Management Pamela [name] is a 71 y.o. female who reports to the clinic for monitoring of warfarin treatment.    Indication: Clotting disorder  Duration: indefinite Supervising physician: [facility] Visit History: Patient does not report signs/symptoms of bleeding or thromboembolism  Other recent changes: None. Anticoagulation Episode Summary    Current INR goal:   2.0-3.0  TTR:   69.8 % (6.3 y)  Next INR check:   01/18/2017  INR from last check:   2.20 (12/14/2016)  Weekly max warfarin dose:     Target end date:   Indefinite  [facility]   [facility]  Preferred lab:     Send INR reminders to:   ANTICOAG IMP   Indications   Clotting disorder ([place]) [D68.9] DVT HX OF (Resolved) [N27.782]       Comments:         Anticoagulation Care Providers    Provider Role Specialty Phone number   Burman Freestone, MD  Internal Medicine [phone]     ASSESSMENT Recent Results: The most recent result is correlated with 18 mg per week: Lab Results  Component Value Date   INR 2.20 12/14/2016   INR 3.10 11/09/2016   INR 2.60 10/12/2016    Anticoagulation Dosing: INR as of 12/14/2016 and Previous Warfarin Dosing Information    INR Dt INR Goal [employer] Sun Mon Tue Wed Thu Fri Sat   12/14/2016 2.20 2.0-3.0 18 mg 2 mg 4 mg 2 mg 2 mg 4 mg 2 mg 2 mg    Previous description   Take 1 tablet of your 4mg  blue colored warfarin tablets by mouth once-daily on Mondays and Thursdays--all other days, take only 1/2 tablet.    Anticoagulation Warfarin Dose Instructions as of 12/14/2016      Total Sun Mon Tue Wed Thu Fri Sat   New Dose 18 mg 2 mg 4 mg 2 mg 2 mg 4 mg 2 mg 2 mg     (4 mg x 0.5)  (4 mg x 1)  (4 mg x 0.5)  (4 mg x 0.5)  (4 mg x 1)  (4 mg x 0.5)  (4 mg x 0.5)                         Description   Take 1 tablet of your 4mg  blue colored warfarin tablets by mouth once-daily on Mondays and Thursdays--all other days,  take only 1/2 tablet.      INR today: Therapeutic  PLAN Weekly dose was unchanged   Patient Instructions  Patient instructed to take medications as defined in the Anti-coagulation Track section of this encounter.  Patient instructed to take today's dose.  Patient instructed to take 1 tablet of your 4mg  blue colored warfarin tablets by mouth once-daily on Mondays and Thursdays--all other days, take only 1/2 tablet. Patient verbalized understanding of these instructions.     Patient advised to contact clinic or seek medical attention if signs/symptoms of bleeding or thromboembolism occur.  Patient verbalized understanding by repeating back information and was advised to contact me if further medication-related questions arise. Patient was also provided an information handout.  Follow-up Return in 5 weeks (on 01/18/2017) for Follow up  INR at 1000h.  Ceasar Lund, Eustaquio Boyden PharmD, CACP  Professor of Pharmacy, [facility], [place]  15 minutes spent face-to-face with the patient during the encounter. 95% of time spent on education. 5% of time was  spent on finger stick point of care specimen collection and data entry.

## 2017-01-18 ENCOUNTER — Ambulatory Visit (INDEPENDENT_AMBULATORY_CARE_PROVIDER_SITE_OTHER): Payer: Medicare Other | Admitting: Pharmacist

## 2017-01-18 DIAGNOSIS — D689 Coagulation defect, unspecified: Secondary | ICD-10-CM

## 2017-01-18 DIAGNOSIS — Z7901 Long term (current) use of anticoagulants: Secondary | ICD-10-CM

## 2017-01-18 DIAGNOSIS — Z86718 Personal history of other venous thrombosis and embolism: Secondary | ICD-10-CM | POA: Diagnosis not present

## 2017-01-18 LAB — POCT INR: INR: 2.9

## 2017-01-18 NOTE — Patient Instructions (Signed)
Patient instructed to take medications as defined in the Anti-coagulation Track section of this encounter.  Patient instructed to take today's dose.  Patient instructed to take 1 tablet of your 4mg  blue colored warfarin tablets by mouth once-daily on Mondays--all other days, take only 1/2 tablet.  Patient verbalized understanding of these instructions.

## 2017-01-18 NOTE — Progress Notes (Signed)
Anticoagulation Management Pamela [name] is a 71 y.o. female who reports to the clinic for monitoring of warfarin treatment.    Indication: factor V Leiden deficiency/clotting disorder with history of DVT, resolved. Duration: indefinite Supervising physician: [facility] Visit History: Patient does not report signs/symptoms of bleeding or thromboembolism  Other recent changes: No diet, medications, lifestyle changes endorsed by the patient.  Anticoagulation Episode Summary    Current INR goal:   2.0-3.0  TTR:   70.2 % (6.4 y)  Next INR check:   02/22/2017  INR from last check:   2.90 (01/18/2017)  Weekly max warfarin dose:     Target end date:   Indefinite  [facility]   [facility]  Preferred lab:     Send INR reminders to:   ANTICOAG IMP   Indications   Clotting disorder ([place]) [D68.9] DVT HX OF (Resolved) [M62.947]       Comments:         Anticoagulation Care Providers    Provider Role Specialty Phone number   Burman Freestone, MD  Internal Medicine [phone]     ASSESSMENT Recent Results: The most recent result is correlated with 18 mg per week: Lab Results  Component Value Date   INR 2.90 01/18/2017   INR 2.20 12/14/2016   INR 3.10 11/09/2016    Anticoagulation Dosing: INR as of 01/18/2017 and Previous Warfarin Dosing Information    INR Dt INR Goal [employer] Sun Mon Tue Wed Thu Fri Sat   01/18/2017 2.90 2.0-3.0 18 mg 2 mg 4 mg 2 mg 2 mg 4 mg 2 mg 2 mg    Previous description   Take 1 tablet of your 4mg  blue colored warfarin tablets by mouth once-daily on Mondays and Thursdays--all other days, take only 1/2 tablet.    Anticoagulation Warfarin Dose Instructions as of 01/18/2017      Total Sun Mon Tue Wed Thu Fri Sat   New Dose 16 mg 2 mg 4 mg 2 mg 2 mg 2 mg 2 mg 2 mg     (4 mg x 0.5)  (4 mg x 1)  (4 mg x 0.5)  (4 mg x 0.5)  (4 mg x 0.5)  (4 mg x 0.5)  (4 mg x 0.5)                         Description   Take 1  tablet of your 4mg  blue colored warfarin tablets by mouth once-daily on Mondays--all other days, take only 1/2 tablet.      INR today: Therapeutic  PLAN Weekly dose was decreased by 12% to 16 mg per week  Patient Instructions  Patient instructed to take medications as defined in the Anti-coagulation Track section of this encounter.  Patient instructed to take today's dose.  Patient instructed to take 1 tablet of your 4mg  blue colored warfarin tablets by mouth once-daily on Mondays--all other days, take only 1/2 tablet.  Patient verbalized understanding of these instructions.      Patient advised to contact clinic or seek medical attention if signs/symptoms of bleeding or thromboembolism occur.  Patient verbalized understanding by repeating back information and was advised to contact me if further medication-related questions arise. Patient was also provided an information handout.  Follow-up Return in 5 weeks (on 02/22/2017) for Follow up INR at 1000h.  Caryl Bis PharmD, CACP, CPP  15 minutes spent face-to-face with the patient during the  encounter. 50% of time spent on education. 50% of time was spent on fingerstick point of care INR sample collection, processing, interpretation and data-entry into EPIC/CHL and [email or web address]

## 2017-01-20 NOTE — Progress Notes (Signed)
I reviewed Dr. Gladstone Pih note.  INR at high end of goal and coumadin decreased.  Follow up per his note.

## 2017-02-11 ENCOUNTER — Ambulatory Visit (INDEPENDENT_AMBULATORY_CARE_PROVIDER_SITE_OTHER): Payer: Medicare Other | Admitting: Internal Medicine

## 2017-02-11 VITALS — BP 133/74 | HR 64 | Temp 98.3°F

## 2017-02-11 DIAGNOSIS — Z87891 Personal history of nicotine dependence: Secondary | ICD-10-CM

## 2017-02-11 DIAGNOSIS — K909 Intestinal malabsorption, unspecified: Secondary | ICD-10-CM

## 2017-02-11 DIAGNOSIS — Z79899 Other long term (current) drug therapy: Secondary | ICD-10-CM | POA: Diagnosis not present

## 2017-02-11 DIAGNOSIS — R7309 Other abnormal glucose: Secondary | ICD-10-CM

## 2017-02-11 DIAGNOSIS — K9089 Other intestinal malabsorption: Secondary | ICD-10-CM

## 2017-02-11 DIAGNOSIS — K219 Gastro-esophageal reflux disease without esophagitis: Secondary | ICD-10-CM

## 2017-02-11 DIAGNOSIS — I10 Essential (primary) hypertension: Secondary | ICD-10-CM | POA: Diagnosis not present

## 2017-02-11 DIAGNOSIS — E039 Hypothyroidism, unspecified: Secondary | ICD-10-CM | POA: Diagnosis not present

## 2017-02-11 DIAGNOSIS — Z0000 Encounter for general adult medical examination without abnormal findings: Secondary | ICD-10-CM

## 2017-02-11 DIAGNOSIS — M8580 Other specified disorders of bone density and structure, unspecified site: Secondary | ICD-10-CM

## 2017-02-11 DIAGNOSIS — D689 Coagulation defect, unspecified: Secondary | ICD-10-CM

## 2017-02-11 LAB — POCT GLYCOSYLATED HEMOGLOBIN (HGB A1C): Hemoglobin A1C: 5

## 2017-02-11 LAB — GLUCOSE, CAPILLARY: GLUCOSE-CAPILLARY: 86 mg/dL (ref 65–99)

## 2017-02-11 MED ORDER — COLESTIPOL HCL 1 G PO TABS: ORAL_TABLET | ORAL | 11 refills | Status: DC

## 2017-02-11 NOTE — Patient Instructions (Signed)
1. Pls see me in 12 months 2. I will mail you your test results

## 2017-02-11 NOTE — Assessment & Plan Note (Addendum)
We discussed CBC and A1c and she elected to have both.  She is having bilateral knee discomfort when she stands, sits, or on stairs. She is very cautious when on stairs and uses a hand rail. Otherwise she has no discomfort. She is not using any medication because discomfort is not present enough to warrant it. We discussed treatments including cautious use of nonsteroidals, Tylenol, and a topical Voltaren gel. If her symptoms become more frequent, I informed her I would be happy to prescribe Voltaren over the phone.

## 2017-02-11 NOTE — Assessment & Plan Note (Signed)
This problem is chronic and stable. She uses her PPI once a day. Her nighttime cough has not returned. She is having no side effects to this medication.  PLAN:  Cont current meds

## 2017-02-11 NOTE — Assessment & Plan Note (Signed)
This problem is chronic and stable. She is on losartan 25. She does not check her blood pressure at home because her cuff isn't accurate. Her repeat blood pressure today was at goal. She is having no side effects to this medication.  PLAN:  Cont current meds   BP Readings from Last 3 Encounters:  02/11/17 133/74  12/30/15 138/84  12/12/15 136/70

## 2017-02-11 NOTE — Assessment & Plan Note (Signed)
This problem is chronic and stable. She is on synthroid 88 mcg and reports no sxs related to hypo or hyper thyroidism. Last TSH about one yr ago.   PLAN:  Cont current meds TSH today

## 2017-02-11 NOTE — Progress Notes (Signed)
   Subjective:    Patient ID: Pamela [name], female    DOB: 1945-11-16, 71 y.o.   MRN: [id number]  HPI  Pamela [name] is here for HTN F/U. Please see the A&P for the status of the pt's chronic medical problems.  ROS : per ROS section and in problem oriented charting. All other systems are negative.  PMHx, Soc hx, and / or Fam hx : Older sister by 5 yrs had lung cancer dx 7 yrs ago tx with surgery, chemo, and XRT. Recurred and now mostly bed bound, O2 dependent. Lives with husband in [place]. Husband physiatrist and sister still able to do his insurance crap. Pamela [name] spent week with sister while her husband went on fishing trip.   Review of Systems  Constitutional: Negative for unexpected weight change.  Eyes: Negative for itching and visual disturbance.  Respiratory: Negative for cough.   Cardiovascular: Negative for chest pain.  Gastrointestinal: Positive for diarrhea. Negative for blood in stool.  Genitourinary: Negative for hematuria and vaginal bleeding.       Objective:   Physical Exam  Constitutional: She appears well-developed and well-nourished. No distress.  HENT:  Head: Normocephalic and atraumatic.  Right Ear: External ear normal.  Left Ear: External ear normal.  Nose: Nose normal.  Eyes: Conjunctivae and EOM are normal.  Cardiovascular: Normal rate, regular rhythm and normal heart sounds.   No murmur heard. Pulmonary/Chest: Effort normal and breath sounds normal. No respiratory distress.  Musculoskeletal: Normal range of motion. She exhibits no edema, tenderness or deformity.  Skin: Skin is warm and dry. She is not diaphoretic.  Scar under R chin  Psychiatric: She has a normal mood and affect. Her behavior is normal. Judgment and thought content normal.          Assessment & Plan:

## 2017-02-11 NOTE — Assessment & Plan Note (Signed)
This problem is chronic and stable. She started to have more loose stools in started taking the Colestid every day. She still has some GI distress but feels that the once a day medication and has a good balance between symptoms and constipation.  PLAN:  Cont current meds

## 2017-02-11 NOTE — Assessment & Plan Note (Signed)
This problem is chronic and stable. She continues to take her calcium and D. She is having no side effects to this medication.  PLAN:  Cont current meds

## 2017-02-12 LAB — BMP8+ANION GAP
ANION GAP: 15 mmol/L (ref 10.0–18.0)
BUN / CREAT RATIO: 18 (ref 12–28)
BUN: 14 mg/dL (ref 8–27)
CO2: 26 mmol/L (ref 20–29)
CREATININE: 0.78 mg/dL (ref 0.57–1.00)
Calcium: 9.6 mg/dL (ref 8.7–10.3)
Chloride: 102 mmol/L (ref 96–106)
GFR calc Af Amer: 88 mL/min/{1.73_m2} (ref >59)
GFR, EST NON AFRICAN AMERICAN: 76 mL/min/{1.73_m2} (ref >59)
Glucose: 83 mg/dL (ref 65–99)
Potassium: 4.6 mmol/L (ref 3.5–5.2)
Sodium: 143 mmol/L (ref 134–144)

## 2017-02-12 LAB — TSH: TSH: 1.04 u[IU]/mL (ref 0.450–4.500)

## 2017-02-12 LAB — CBC
HEMATOCRIT: 42.3 % (ref 34.0–46.6)
Hemoglobin: 13.6 g/dL (ref 11.1–15.9)
MCH: 32.6 pg (ref 26.6–33.0)
MCHC: 32.2 g/dL (ref 31.5–35.7)
MCV: 101 fL — AB (ref 79–97)
PLATELETS: 304 10*3/uL (ref 150–379)
RBC: 4.17 x10E6/uL (ref 3.77–5.28)
RDW: 13.7 % (ref 12.3–15.4)
WBC: 7.2 10*3/uL (ref 3.4–10.8)

## 2017-02-15 ENCOUNTER — Encounter: Payer: Self-pay | Admitting: Internal Medicine

## 2017-02-22 ENCOUNTER — Ambulatory Visit (INDEPENDENT_AMBULATORY_CARE_PROVIDER_SITE_OTHER): Payer: Medicare Other | Admitting: Pharmacist

## 2017-02-22 DIAGNOSIS — D689 Coagulation defect, unspecified: Secondary | ICD-10-CM

## 2017-02-22 DIAGNOSIS — Z7901 Long term (current) use of anticoagulants: Secondary | ICD-10-CM

## 2017-02-22 DIAGNOSIS — Z86718 Personal history of other venous thrombosis and embolism: Secondary | ICD-10-CM

## 2017-02-22 LAB — POCT INR: INR: 1.6

## 2017-02-22 NOTE — Progress Notes (Signed)
Anticoagulation Management Pamela [name] is a 71 y.o. female who reports to the clinic for monitoring of warfarin treatment.    Indication: clotting disorder  Duration: indefinite Supervising physician: Joni Reining  Anticoagulation Clinic Visit History: Patient does not report signs/symptoms of bleeding or thromboembolism  Other recent changes: No changes in diet, medications, lifestyle reported by patient Anticoagulation Episode Summary    Current INR goal:   2.0-3.0  TTR:   70.2 % (6.5 y)  Next INR check:   03/22/2017  INR from last check:   1.6! (02/22/2017)  Weekly max warfarin dose:     Target end date:   Indefinite  IN[facility] check location[facility]   [facility]  Preferred lab:     Send INR reminders to:   [facility]   Indications   Clotting disorder ([place]) [D68.9] DVT HX OF (Resolved) [N23.557]       Comments:         Anticoagulation Care Providers    Provider Role Specialty Phone number   Burman Freestone, MD  Internal Medicine [phone]      Allergies  Allergen Reactions  . Ace Inhibitors Cough   Prior to Admission medications   Medication Sig Start Date End Date Taking? Authorizing Provider  colestipol (COLESTID) 1 g tablet TAKE 1 TABLET BY MOUTH QD 02/11/17  Yes Bartholomew Crews, MD  indomethacin (INDOCIN) 25 MG capsule Prescribed by podiatry for use PRN 11/06/16  Yes Bartholomew Crews, MD  levothyroxine (SYNTHROID, LEVOTHROID) 88 MCG tablet TAKE ONE (1) TABLET BY MOUTH EVERY DAY 12/10/16  Yes Bartholomew Crews, MD  losartan (COZAAR) 25 MG tablet TAKE ONE (1) TABLET BY MOUTH EVERY DAY 12/03/16  Yes Bartholomew Crews, MD  Multiple Vitamins-Calcium (DAILY COMBO MULTIVITS/CALCIUM) TABS Take 1 tablet by mouth daily.     Yes [provider]  naproxen (NAPROSYN) 500 MG tablet Take 1 tablet (500 mg total) by mouth 2 (two) times daily as needed. 11/02/16  Yes Bartholomew Crews, MD  pantoprazole (PROTONIX) 40 MG tablet TAKE ONE (1) TABLET BY MOUTH  EVERY DAY 09/25/16  Yes Bartholomew Crews, MD  warfarin (COUMADIN) 4 MG tablet Take one tablet on Mon / Wed / Fri. Take one-half tablet all other days. Dose will change frequently. 11/02/16  Yes Bartholomew Crews, MD   Past Medical History:  Diagnosis Date  . CHOLELITHIASIS, WITH OBSTRUCTION 04/21/2006   s/p ERCP,sprincterotomy, stent ([facility])  . 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 GLAUCOMA 04/24/2008  . HYPERLIPIDEMIA 06/03/2006  . Hypothyroidism lifelong  . OBESITY, MILD 04/24/2008  . Tenosynovitis 01/2004   [facility]   Social History   Social History  . Marital status: Married    Spouse name: N/A  . Number of children: N/A  . Years of education: N/A   Occupational History  . [employer]    Retired in 2011   Social History Main Topics  . Smoking status: Former Smoker    Packs/day: 1.00    Years: 20.00    Quit date: 07/06/1985  . Smokeless tobacco: Not on file     Comment: Quit 1988  . Alcohol use Yes     Comment: 2-3 glasses of wine daily.  . Drug use: No  . Sexual activity: Not on file   Other Topics Concern  . Not on file   Social History Narrative   Married, no regular exercise.   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    Family History  Problem Relation Age of Onset  . Cancer Mother        H&N, smoker  . Cancer Sister        lung, 2011  . Other Other        grandmother had mastectomy in her 77's unknown reason  . Breast cancer Neg Hx     ASSESSMENT Recent Results: The most recent result is correlated with 16 mg per week: Lab Results  Component Value Date   INR 1.6 02/22/2017   INR 2.90 01/18/2017   INR 2.20 12/14/2016    Anticoagulation Dosing: INR as of 02/22/2017 and Previous Warfarin Dosing Information    INR Dt INR Goal Wkly Tot Sun Mon Tue Wed Thu Fri Sat   02/22/2017 1.6 2.0-3.0 16  mg 2 mg 4 mg 2 mg 2 mg 2 mg 2 mg 2 mg    Previous description   Take 1 tablet of your 4mg  blue colored warfarin tablets by mouth once-daily on Mondays--all other days, take only 1/2 tablet.    Anticoagulation Warfarin Dose Instructions as of 02/22/2017      Total Sun Mon Tue Wed Thu Fri Sat   New Dose 18 mg 2 mg 4 mg 2 mg 2 mg 4 mg 2 mg 2 mg     (4 mg x 0.5)  (4 mg x 1)  (4 mg x 0.5)  (4 mg x 0.5)  (4 mg x 1)  (4 mg x 0.5)  (4 mg x 0.5)                         Description   Take 1 tablet of your 4mg  blue colored warfarin tablets by mouth once-daily on Mondays and Thursdays--all other days, take only 1/2 tablet.      INR today: Subtherapeutic  PLAN Weekly dose was increased by 12% to 18 mg per week  Patient Instructions  Patient instructed to take medications as defined in the Anti-coagulation Track section of this encounter.  Patient instructed to take today's dose.  Patient instructed to take 1 tablet of your 4mg  blue colored warfarin tablets by mouth once-daily on Mondays and Thursdays--all other days, take only 1/2 tablet. Patient verbalized understanding of these instructions.     Patient advised to contact clinic or seek medical attention if signs/symptoms of bleeding or thromboembolism occur.  Patient verbalized understanding by repeating back information and was advised to contact me if further medication-related questions arise. Patient was also provided an information handout.  Follow-up Return in 4 weeks (on 03/22/2017) for Follow up INR at 1000.  Mila Merry Gerarda Fraction, PharmD PGY1 Pharmacy Resident  15 minutes spent face-to-face with the patient during the encounter. 50% of time spent on education. 50% of time was spent on point of care INR fingerstick test, processing, results, interpretation, and documentation in Epic/CHL and [email or web address]

## 2017-02-22 NOTE — Patient Instructions (Signed)
Patient instructed to take medications as defined in the Anti-coagulation Track section of this encounter.  Patient instructed to take today's dose.  Patient instructed to take 1 tablet of your 4mg blue colored warfarin tablets by mouth once-daily on Mondays and Thursdays--all other days, take only 1/2 tablet.  Patient verbalized understanding of these instructions.    

## 2017-03-01 NOTE — Progress Notes (Signed)
INTERNAL MEDICINE TEACHING ATTENDING ADDENDUM - Pamela Groves, DO Duration- indefinate, Indication- clotting disorder, recurrent VTE, INR-  Lab Results  Component Value Date   INR 1.6 02/22/2017  . Agree with pharmacy recommendations as outlined in their note.

## 2017-03-22 ENCOUNTER — Ambulatory Visit: Payer: Medicare Other

## 2017-03-22 ENCOUNTER — Ambulatory Visit (INDEPENDENT_AMBULATORY_CARE_PROVIDER_SITE_OTHER): Payer: Medicare Other | Admitting: Pharmacist

## 2017-03-22 DIAGNOSIS — Z23 Encounter for immunization: Secondary | ICD-10-CM

## 2017-03-22 DIAGNOSIS — D689 Coagulation defect, unspecified: Secondary | ICD-10-CM

## 2017-03-22 LAB — POCT INR: INR: 2.9

## 2017-03-22 NOTE — Progress Notes (Signed)
Anticoagulation Management Pamela [name] is a 71 y.o. female who reports to the clinic for monitoring of warfarin treatment.    Indication: DVT, history of (Resolved) [Z86.718]; Clotting disorder ([place]) [68.9] Duration: indefinite Supervising physician: Joni Reining  Anticoagulation Clinic Visit History: Patient does not report signs/symptoms of bleeding or thromboembolism  Other recent changes: No diet, medications, lifestyle changes endorsed to me by the patient.  Anticoagulation Episode Summary    Current INR goal:   2.0-3.0  TTR:   70.2 % (6.6 y)  Next INR check:   04/19/2017  INR from last check:   2.90 (03/22/2017)  Weekly max warfarin dose:     Target end date:   Indefinite  INR check location[facility]   [facility]  Preferred lab:     Send INR reminders to:   [facility]   Indications   Clotting disorder ([place]) [D68.9] DVT HX OF (Resolved) [L38.101]       Comments:         Anticoagulation Care Providers    Provider Role Specialty Phone number   Burman Freestone, MD  Internal Medicine [phone]      Allergies  Allergen Reactions  . Ace Inhibitors Cough   Prior to Admission medications   Medication Sig Start Date End Date Taking? Authorizing Provider  colestipol (COLESTID) 1 g tablet TAKE 1 TABLET BY MOUTH QD 02/11/17  Yes Bartholomew Crews, MD  indomethacin (INDOCIN) 25 MG capsule Prescribed by podiatry for use PRN 11/06/16  Yes Bartholomew Crews, MD  levothyroxine (SYNTHROID, LEVOTHROID) 88 MCG tablet TAKE ONE (1) TABLET BY MOUTH EVERY DAY 12/10/16  Yes Bartholomew Crews, MD  losartan (COZAAR) 25 MG tablet TAKE ONE (1) TABLET BY MOUTH EVERY DAY 12/03/16  Yes Bartholomew Crews, MD  Multiple Vitamins-Calcium (DAILY COMBO MULTIVITS/CALCIUM) TABS Take 1 tablet by mouth daily.     Yes [provider]  naproxen (NAPROSYN) 500 MG tablet Take 1 tablet (500 mg total) by mouth 2 (two) times daily as needed. 11/02/16  Yes Bartholomew Crews, MD   pantoprazole (PROTONIX) 40 MG tablet TAKE ONE (1) TABLET BY MOUTH EVERY DAY 09/25/16  Yes Bartholomew Crews, MD  warfarin (COUMADIN) 4 MG tablet Take one tablet on Mon / Wed / Fri. Take one-half tablet all other days. Dose will change frequently. 11/02/16  Yes Bartholomew Crews, MD   Past Medical History:  Diagnosis Date  . CHOLELITHIASIS, WITH OBSTRUCTION 04/21/2006   s/p ERCP,sprincterotomy, stent ([facility])  . 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 GLAUCOMA 04/24/2008  . HYPERLIPIDEMIA 06/03/2006  . Hypothyroidism lifelong  . OBESITY, MILD 04/24/2008  . Tenosynovitis 01/2004   [facility]   Social History   Social History  . Marital status: Married    Spouse name: N/A  . Number of children: N/A  . Years of education: N/A   Occupational History  . [employer]    Retired in 2011   Social History Main Topics  . Smoking status: Former Smoker    Packs/day: 1.00    Years: 20.00    Quit date: 07/06/1985  . Smokeless tobacco: Not on file     Comment: Quit 1988  . Alcohol use Yes     Comment: 2-3 glasses of wine daily.  . Drug use: No  . Sexual activity: Not on file   Other Topics Concern  . Not on file   Social History Narrative  Married, no regular exercise.   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    Family History  Problem Relation Age of Onset  . Cancer Mother        H&N, smoker  . Cancer Sister        lung, 2011  . Other Other        grandmother had mastectomy in her 58's unknown reason  . Breast cancer Neg Hx     ASSESSMENT Recent Results: The most recent result is correlated with 18 mg per week: Lab Results  Component Value Date   INR 2.90 03/22/2017   INR 1.6 02/22/2017   INR 2.90 01/18/2017    Anticoagulation Dosing: INR as of 03/22/2017 and Previous Warfarin Dosing Information    INR Dt INR  Goal [employer] Sun Mon Tue Wed Thu Fri Sat   03/22/2017 2.90 2.0-3.0 18 mg 2 mg 4 mg 2 mg 2 mg 4 mg 2 mg 2 mg    Previous description   Take 1 tablet of your 4mg  blue colored warfarin tablets by mouth once-daily on Mondays and Thursdays--all other days, take only 1/2 tablet.    Anticoagulation Warfarin Dose Instructions as of 03/22/2017      Total Sun Mon Tue Wed Thu Fri Sat   New Dose 18 mg 2 mg 4 mg 2 mg 2 mg 4 mg 2 mg 2 mg     (4 mg x 0.5)  (4 mg x 1)  (4 mg x 0.5)  (4 mg x 0.5)  (4 mg x 1)  (4 mg x 0.5)  (4 mg x 0.5)                         Description   Take 1 tablet of your 4mg  blue colored warfarin tablets by mouth once-daily on Mondays and Thursdays--all other days, take only 1/2 tablet.      INR today: Therapeutic  PLAN Weekly dose was unchanged.  Patient Instructions  Patient instructed to take medications as defined in the Anti-coagulation Track section of this encounter.  Patient instructed to take today's dose.  Patient instructed to take 1 tablet of your 4mg  blue colored warfarin tablets by mouth once-daily on Mondays and Thursdays--all other days, take only 1/2 tablet.  Patient verbalized understanding of these instructions.     Patient advised to contact clinic or seek medical attention if signs/symptoms of bleeding or thromboembolism occur.  Patient verbalized understanding by repeating back information and was advised to contact me if further medication-related questions arise. Patient was also provided an information handout.  Follow-up Return in 4 weeks (on 04/19/2017) for Follow up INR at 1015h.  Pennie Banter, PharmD, CACP, CPP  15 minutes spent face-to-face with the patient during the encounter. 50% of time spent on education. 50% of time was spent on fingerstick point of care INR sample collection, processing, results interpretation and documentation in to EPIC/CHL and [email or web address]

## 2017-03-22 NOTE — Progress Notes (Signed)
INTERNAL MEDICINE TEACHING ATTENDING ADDENDUM - Lucious Groves, DO Duration- indefinate, Indication- DVT, INR-  Lab Results  Component Value Date   INR 2.90 03/22/2017  . Agree with pharmacy recommendations as outlined in their note.

## 2017-03-22 NOTE — Patient Instructions (Signed)
Patient instructed to take medications as defined in the Anti-coagulation Track section of this encounter.  Patient instructed to take today's dose.  Patient instructed to take 1 tablet of your 4mg  blue colored warfarin tablets by mouth once-daily on Mondays and Thursdays--all other days, take only 1/2 tablet.  Patient verbalized understanding of these instructions.

## 2017-04-02 ENCOUNTER — Other Ambulatory Visit: Payer: Self-pay | Admitting: Obstetrics and Gynecology

## 2017-04-02 DIAGNOSIS — Z1231 Encounter for screening mammogram for malignant neoplasm of breast: Secondary | ICD-10-CM

## 2017-04-19 ENCOUNTER — Ambulatory Visit: Payer: Medicare Other

## 2017-04-21 ENCOUNTER — Ambulatory Visit
Admission: RE | Admit: 2017-04-21 | Discharge: 2017-04-21 | Disposition: A | Discharge status: Home / Self Care | Payer: Medicare Other | Source: Ambulatory Visit | Attending: Obstetrics and Gynecology | Admitting: Obstetrics and Gynecology

## 2017-04-21 DIAGNOSIS — Z1231 Encounter for screening mammogram for malignant neoplasm of breast: Secondary | ICD-10-CM

## 2017-04-26 ENCOUNTER — Ambulatory Visit (INDEPENDENT_AMBULATORY_CARE_PROVIDER_SITE_OTHER): Payer: Medicare Other | Admitting: Pharmacist

## 2017-04-26 DIAGNOSIS — D689 Coagulation defect, unspecified: Secondary | ICD-10-CM | POA: Diagnosis not present

## 2017-04-26 DIAGNOSIS — Z7901 Long term (current) use of anticoagulants: Secondary | ICD-10-CM

## 2017-04-26 LAB — POCT INR: INR: 2.2

## 2017-04-26 NOTE — Progress Notes (Signed)
Anticoagulation Management Pamela [name] is a 71 y.o. female who reports to the clinic for monitoring of warfarin treatment.    Indication: Clotting disorder ([place]) [D68.9], DVT, History of (Resolved) [Z86.718], long term (current) use of anticoagulants.  Duration: indefinite Supervising physician: Oval Linsey  Anticoagulation Clinic Visit History: Patient does not report signs/symptoms of bleeding or thromboembolism  Other recent changes: No diet, medications, lifestyle changes endorsed by the patient to me.  Anticoagulation Episode Summary    Current INR goal:   2.0-3.0  TTR:   70.6 % (6.7 y)  Next INR check:   05/24/2017  INR from last check:   2.20 (04/26/2017)  Weekly max warfarin dose:     Target end date:   Indefinite  INR check location[facility]   [facility]  Preferred lab:     Send INR reminders to:   [facility]   Indications   Clotting disorder ([place]) [D68.9] DVT HX OF (Resolved) [Z61.096]       Comments:         Anticoagulation Care Providers    Provider Role Specialty Phone number   Burman Freestone, MD  Internal Medicine [phone]      Allergies  Allergen Reactions  . Ace Inhibitors Cough   Prior to Admission medications   Medication Sig Start Date End Date Taking? Authorizing Provider  colestipol (COLESTID) 1 g tablet TAKE 1 TABLET BY MOUTH QD 02/11/17  Yes Bartholomew Crews, MD  indomethacin (INDOCIN) 25 MG capsule Prescribed by podiatry for use PRN 11/06/16  Yes Bartholomew Crews, MD  levothyroxine (SYNTHROID, LEVOTHROID) 88 MCG tablet TAKE ONE (1) TABLET BY MOUTH EVERY DAY 12/10/16  Yes Bartholomew Crews, MD  losartan (COZAAR) 25 MG tablet TAKE ONE (1) TABLET BY MOUTH EVERY DAY 12/03/16  Yes Bartholomew Crews, MD  Multiple Vitamins-Calcium (DAILY COMBO MULTIVITS/CALCIUM) TABS Take 1 tablet by mouth daily.     Yes [provider]  naproxen (NAPROSYN) 500 MG tablet Take 1 tablet (500 mg total) by mouth 2 (two) times daily as needed.  11/02/16  Yes Bartholomew Crews, MD  pantoprazole (PROTONIX) 40 MG tablet TAKE ONE (1) TABLET BY MOUTH EVERY DAY 09/25/16  Yes Bartholomew Crews, MD  warfarin (COUMADIN) 4 MG tablet Take one tablet on Mon / Wed / Fri. Take one-half tablet all other days. Dose will change frequently. 11/02/16  Yes Bartholomew Crews, MD   Past Medical History:  Diagnosis Date  . CHOLELITHIASIS, WITH OBSTRUCTION 04/21/2006   s/p ERCP,sprincterotomy, stent ([facility])  . 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 GLAUCOMA 04/24/2008  . HYPERLIPIDEMIA 06/03/2006  . Hypothyroidism lifelong  . OBESITY, MILD 04/24/2008  . Tenosynovitis 01/2004   [facility]   Social History   Social History  . Marital status: Widowed    Spouse name: N/A  . Number of children: N/A  . Years of education: N/A   Occupational History  . [place]    Retired in 2011   Social History Main Topics  . Smoking status: Former Smoker    Packs/day: 1.00    Years: 20.00    Quit date: 07/06/1985  . Smokeless tobacco: Not on file     Comment: Quit 1988  . Alcohol use Yes     Comment: 2-3 glasses of wine daily.  . Drug use: No  . Sexual activity: Not on file   Other Topics Concern  . Not on  file   Social History Narrative   Married, no regular exercise.   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    Family History  Problem Relation Age of Onset  . Cancer Mother        H&N, smoker  . Cancer Sister        lung, 2011  . Other Other        grandmother had mastectomy in her 83's unknown reason  . Breast cancer Neg Hx     ASSESSMENT Recent Results: The most recent result is correlated with 18 mg per week: Lab Results  Component Value Date   INR 2.20 04/26/2017   INR 2.90 03/22/2017   INR 1.6 02/22/2017    Anticoagulation Dosing: INR as of 04/26/2017 and Previous  Warfarin Dosing Information    INR Dt INR Goal [employer] Sun Mon Tue Wed Thu Fri Sat   04/26/2017 2.20 2.0-3.0 18 mg 2 mg 4 mg 2 mg 2 mg 4 mg 2 mg 2 mg    Previous description   Take 1 tablet of your 4mg  blue colored warfarin tablets by mouth once-daily on Mondays and Thursdays--all other days, take only 1/2 tablet.    Anticoagulation Warfarin Dose Instructions as of 04/26/2017      Total Sun Mon Tue Wed Thu Fri Sat   New Dose 18 mg 2 mg 4 mg 2 mg 2 mg 4 mg 2 mg 2 mg     (4 mg x 0.5)  (4 mg x 1)  (4 mg x 0.5)  (4 mg x 0.5)  (4 mg x 1)  (4 mg x 0.5)  (4 mg x 0.5)                         Description   Take 1 tablet of your 4mg  blue colored warfarin tablets by mouth once-daily on Mondays and Thursdays--all other days, take only 1/2 tablet.      INR today: Therapeutic  PLAN Weekly dose was unchanged.  Patient Instructions  Patient instructed to take medications as defined in the Anti-coagulation Track section of this encounter.  Patient instructed to take today's dose.  Patient instructed to take 1 tablet of your 4mg  blue colored warfarin tablets by mouth once-daily on Mondays and Thursdays--all other days, take only 1/2 tablet.  Patient verbalized understanding of these instructions.     Patient advised to contact clinic or seek medical attention if signs/symptoms of bleeding or thromboembolism occur.  Patient verbalized understanding by repeating back information and was advised to contact me if further medication-related questions arise. Patient was also provided an information handout.  Follow-up Return in about 4 weeks (around 05/24/2017) for Follow up INR at 1000h.  Pennie Banter, PharmD, CACP, CPP  15 minutes spent face-to-face with the patient during the encounter. 50% of time spent on education. 50% of time was spent on fingerstick point of care INR sample collection, processing, results determination and documentation in EPIC/CHL and [email or web address]

## 2017-04-26 NOTE — Patient Instructions (Signed)
Patient instructed to take medications as defined in the Anti-coagulation Track section of this encounter.  Patient instructed to take today's dose.  Patient instructed to take 1 tablet of your 4mg  blue colored warfarin tablets by mouth once-daily on Mondays and Thursdays--all other days, take only 1/2 tablet.  Patient verbalized understanding of these instructions.

## 2017-05-10 NOTE — Progress Notes (Signed)
Indication: Factor II deficiency. Duration: Indefinite. INR: At target. Agree with Dr. Gladstone Pih assessment and plan.

## 2017-05-24 ENCOUNTER — Ambulatory Visit: Payer: Medicare Other

## 2017-05-24 ENCOUNTER — Ambulatory Visit (INDEPENDENT_AMBULATORY_CARE_PROVIDER_SITE_OTHER): Payer: Medicare Other | Admitting: Pharmacist

## 2017-05-24 DIAGNOSIS — Z86718 Personal history of other venous thrombosis and embolism: Secondary | ICD-10-CM

## 2017-05-24 DIAGNOSIS — Z7901 Long term (current) use of anticoagulants: Secondary | ICD-10-CM | POA: Diagnosis not present

## 2017-05-24 DIAGNOSIS — D689 Coagulation defect, unspecified: Secondary | ICD-10-CM | POA: Diagnosis not present

## 2017-05-24 LAB — POCT INR: INR: 2

## 2017-05-24 NOTE — Progress Notes (Signed)
Indication: Factor II deficiency. Duration: Indefinite. INR: At target. Agree with Dr. Gladstone Pih assessment and plan.

## 2017-05-24 NOTE — Patient Instructions (Signed)
Patient instructed to take medications as defined in the Anti-coagulation Track section of this encounter.  Patient instructed to take today's dose.  Patient instructed to take 1 tablet of your 4mg  blue colored warfarin tablets by mouth once-daily on Mondays, Wednesdays and Fridays; all other days, take only 1/2 tablet. Patient verbalized understanding of these instructions.

## 2017-05-24 NOTE — Progress Notes (Signed)
Anticoagulation Management Pamela [name] is a 71 y.o. female who reports to the clinic for monitoring of warfarin treatment.    Indication: Clotting disorder ([place]) [D68.9], DVT, History of (Resolved) [Z86.718], long term (current) use of anticoagulants.  Duration: indefinite Supervising physician: Oval Linsey  Anticoagulation Clinic Visit History: Patient does not report signs/symptoms of bleeding or thromboembolism  Other recent changes: No diet, medications, lifestyle changes endorsed by the patient to me.  Anticoagulation Episode Summary    Current INR goal:   2.0-3.0  TTR:   71.0 % (6.7 y)  Next INR check:   06/21/2017  INR from last check:   2.00 (05/24/2017)  Weekly max warfarin dose:     Target end date:   Indefinite  INR check location:   [facility]  Preferred lab:     Send INR reminders to:   [facility]   Indications   Clotting disorder ([place]) [D68.9] DVT HX OF (Resolved) [U20.254]       Comments:         Anticoagulation Care Providers    Provider Role Specialty Phone number   Burman Freestone, MD  Internal Medicine [phone]      Allergies  Allergen Reactions  . Ace Inhibitors Cough   Prior to Admission medications   Medication Sig Start Date End Date Taking? Authorizing Provider  colestipol (COLESTID) 1 g tablet TAKE 1 TABLET BY MOUTH QD 02/11/17  Yes Bartholomew Crews, MD  indomethacin (INDOCIN) 25 MG capsule Prescribed by podiatry for use PRN 11/06/16  Yes Bartholomew Crews, MD  levothyroxine (SYNTHROID, LEVOTHROID) 88 MCG tablet TAKE ONE (1) TABLET BY MOUTH EVERY DAY 12/10/16  Yes Bartholomew Crews, MD  losartan (COZAAR) 25 MG tablet TAKE ONE (1) TABLET BY MOUTH EVERY DAY 12/03/16  Yes Bartholomew Crews, MD  Multiple Vitamins-Calcium (DAILY COMBO MULTIVITS/CALCIUM) TABS Take 1 tablet by mouth daily.     Yes [provider]  naproxen (NAPROSYN) 500 MG tablet Take 1 tablet (500 mg total) by mouth 2 (two) times daily as needed.  11/02/16  Yes Bartholomew Crews, MD  pantoprazole (PROTONIX) 40 MG tablet TAKE ONE (1) TABLET BY MOUTH EVERY DAY 09/25/16  Yes Bartholomew Crews, MD  warfarin (COUMADIN) 4 MG tablet Take one tablet on Mon / Wed / Fri. Take one-half tablet all other days. Dose will change frequently. 11/02/16  Yes Bartholomew Crews, MD   Past Medical History:  Diagnosis Date  . CHOLELITHIASIS, WITH OBSTRUCTION 04/21/2006   s/p ERCP,sprincterotomy, stent ([facility])  . 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 GLAUCOMA 04/24/2008  . HYPERLIPIDEMIA 06/03/2006  . Hypothyroidism lifelong  . OBESITY, MILD 04/24/2008  . Tenosynovitis 01/2004   [facility]   Social History   Socioeconomic History  . Marital status: Widowed    Spouse name: Not on file  . Number of children: Not on file  . Years of education: Not on file  . Highest education level: Not on file  Social Needs  . Financial resource strain: Not on file  . Food insecurity - worry: Not on file  . Food insecurity - inability: Not on file  . Transportation needs - medical: Not on file  . Transportation needs - non-medical: Not on file  Occupational History  . 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 Smoking status: Former Smoker  Packs/day: 1.00    Years: 20.00    Pack years: 20.00    Last attempt to quit: 07/06/1985    Years since quitting: 31.9  . Tobacco comment: Quit 1988  Substance and Sexual Activity  . Alcohol use: Yes    Comment: 2-3 glasses of wine daily.  . Drug use: No  . Sexual activity: Not on file  Other Topics Concern  . Not on file  Social History Narrative   Married, no regular exercise.   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 Then worked in [employer] with [place].    Family History  Problem Relation Age of Onset  . Cancer Mother        H&N, smoker  . Cancer  Sister        lung, 2011  . Other Other        grandmother had mastectomy in her 63's unknown reason  . Breast cancer Neg Hx     ASSESSMENT Recent Results: The most recent result is correlated with 18 mg per week: Lab Results  Component Value Date   INR 2.00 05/24/2017   INR 2.20 04/26/2017   INR 2.90 03/22/2017    Anticoagulation Dosing: Description   Take 1 tablet of your 4mg  blue colored warfarin tablets by mouth once-daily on Mondays, Wednesdays and Fridays; all other days, take only 1/2 tablet.      INR today: Therapeutic  PLAN Weekly dose was increased by 11% to 20 mg per week  Patient Instructions  Patient instructed to take medications as defined in the Anti-coagulation Track section of this encounter.  Patient instructed to take today's dose.  Patient instructed to take 1 tablet of your 4mg  blue colored warfarin tablets by mouth once-daily on Mondays, Wednesdays and Fridays; all other days, take only 1/2 tablet. Patient verbalized understanding of these instructions.     Patient advised to contact clinic or seek medical attention if signs/symptoms of bleeding or thromboembolism occur.  Patient verbalized understanding by repeating back information and was advised to contact me if further medication-related questions arise. Patient was also provided an information handout.  Follow-up Return in about 4 weeks (around 06/21/2017) for Follow up INR at 1030h.  Pennie Banter, PharmD, CACP, CPP  15 minutes spent face-to-face with the patient during the encounter. 50% of time spent on education. 50% of time was spent on fingerstick point of care INR sample collection, processing, results determination, dose adjustment and documentation in [email or web address].

## 2017-06-21 ENCOUNTER — Ambulatory Visit (INDEPENDENT_AMBULATORY_CARE_PROVIDER_SITE_OTHER): Payer: Medicare Other | Admitting: Pharmacy Technician

## 2017-06-21 ENCOUNTER — Ambulatory Visit (INDEPENDENT_AMBULATORY_CARE_PROVIDER_SITE_OTHER): Payer: Medicare Other | Admitting: *Deleted

## 2017-06-21 DIAGNOSIS — Z7901 Long term (current) use of anticoagulants: Secondary | ICD-10-CM | POA: Diagnosis not present

## 2017-06-21 DIAGNOSIS — Z23 Encounter for immunization: Secondary | ICD-10-CM | POA: Diagnosis not present

## 2017-06-21 DIAGNOSIS — Z86718 Personal history of other venous thrombosis and embolism: Secondary | ICD-10-CM

## 2017-06-21 DIAGNOSIS — D689 Coagulation defect, unspecified: Secondary | ICD-10-CM

## 2017-06-21 DIAGNOSIS — Z0000 Encounter for general adult medical examination without abnormal findings: Secondary | ICD-10-CM

## 2017-06-21 LAB — POCT INR: INR: 4

## 2017-06-21 NOTE — Patient Instructions (Signed)
Patient instructed to take medications as defined in the Anti-coagulation Track section of this encounter.  Patient instructed to hold tomorrow's dose.  Patient instructed to take 1 tablet of your 4mg  blue colored warfarin tablets by mouth once-daily on Mondays, and Fridays; all other days, take only 1/2 tablet Patient verbalized understanding of these instructions.

## 2017-06-21 NOTE — Addendum Note (Signed)
Addended by: Gaylyn Rong on: 06/21/2017 11:41 AM   Modules accepted: Orders

## 2017-06-21 NOTE — Progress Notes (Signed)
Anticoagulation Management Pamela [name] is a 71 y.o. female who reports to the clinic for monitoring of warfarin treatment.    Indication: Clotting disorder, Hx of DVT  Duration: indefinite Supervising physician: Oval Linsey  Anticoagulation Clinic Visit History: Patient does not report signs/symptoms of bleeding or thromboembolism Other recent changes: No diet, medications, or lifestyle endorsed to me at this visit. Anticoagulation Episode Summary    Current INR goal:   2.0-3.0  TTR:   70.7 % (6.8 y)  Next INR check:   07/12/2017  INR from last check:   4.00! (06/21/2017)  Weekly max warfarin dose:     Target end date:   Indefinite  INR check [facility]   [facility]  Preferred lab:     Send INR reminders to:   [facility]   Indications   Clotting disorder ([place]) [D68.9] DVT HX OF (Resolved) [K53.976]       Comments:         Anticoagulation Care Providers    Provider Role Specialty Phone number   Burman Freestone, MD  Internal Medicine [phone]      Allergies  Allergen Reactions  . Ace Inhibitors Cough   Prior to Admission medications   Medication Sig Start Date End Date Taking? Authorizing Provider  colestipol (COLESTID) 1 g tablet TAKE 1 TABLET BY MOUTH QD 02/11/17  Yes Bartholomew Crews, MD  indomethacin (INDOCIN) 25 MG capsule Prescribed by podiatry for use PRN 11/06/16  Yes Bartholomew Crews, MD  levothyroxine (SYNTHROID, LEVOTHROID) 88 MCG tablet TAKE ONE (1) TABLET BY MOUTH EVERY DAY 12/10/16  Yes Bartholomew Crews, MD  losartan (COZAAR) 25 MG tablet TAKE ONE (1) TABLET BY MOUTH EVERY DAY 12/03/16  Yes Bartholomew Crews, MD  Multiple Vitamins-Calcium (DAILY COMBO MULTIVITS/CALCIUM) TABS Take 1 tablet by mouth daily.     Yes [provider]  naproxen (NAPROSYN) 500 MG tablet Take 1 tablet (500 mg total) by mouth 2 (two) times daily as needed. 11/02/16  Yes Bartholomew Crews, MD  pantoprazole (PROTONIX) 40 MG tablet TAKE ONE (1)  TABLET BY MOUTH EVERY DAY 09/25/16  Yes Bartholomew Crews, MD  warfarin (COUMADIN) 4 MG tablet Take one tablet on Mon / Wed / Fri. Take one-half tablet all other days. Dose will change frequently. 11/02/16  Yes Bartholomew Crews, MD   Past Medical History:  Diagnosis Date  . CHOLELITHIASIS, WITH OBSTRUCTION 04/21/2006   s/p ERCP,sprincterotomy, stent ([facility])  . 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 GLAUCOMA 04/24/2008  . HYPERLIPIDEMIA 06/03/2006  . Hypothyroidism lifelong  . OBESITY, MILD 04/24/2008  . Tenosynovitis 01/2004   [facility]   Social History   Socioeconomic History  . Marital status: Widowed    Spouse name: Not on file  . Number of children: Not on file  . Years of education: Not on file  . Highest education level: Not on file  Social Needs  . Financial resource strain: Not on file  . Food insecurity - worry: Not on file  . Food insecurity - inability: Not on file  . Transportation needs - medical: Not on file  . Transportation needs - non-medical: Not on file  Occupational History  . 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 Smoking status: Former Smoker    Packs/day: 1.00    Years: 20.00    Pack years:  20.00    Last attempt to quit: 07/06/1985    Years since quitting: 31.9  . Tobacco comment: Quit 1988  Substance and Sexual Activity  . Alcohol use: Yes    Comment: 2-3 glasses of wine daily.  . Drug use: No  . Sexual activity: Not on file  Other Topics Concern  . Not on file  Social History Narrative   Married, no regular exercise.   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    Family History  Problem Relation Age of Onset  . Cancer Mother        H&N, smoker  . Cancer Sister        lung, 2011  . Other Other        grandmother had mastectomy in her 62's  unknown reason  . Breast cancer Neg Hx     ASSESSMENT Recent Results: The most recent result is correlated with 20 mg per week: Lab Results  Component Value Date   INR 4.00 06/21/2017   INR 2.00 05/24/2017   INR 2.20 04/26/2017    Anticoagulation Dosing: Description   Take 1 tablet of your 4mg  blue colored warfarin tablets by mouth once-daily on Mondays, and Fridays; all other days, take only 1/2 tablet.      INR today: Supratherapeutic  PLAN Weekly dose was decreased by 10% to 18 mg per week, and 12/18 dose is to be held.    Patient Instructions  Patient instructed to take medications as defined in the Anti-coagulation Track section of this encounter.  Patient instructed to hold tomorrow's dose.  Patient instructed to take 1 tablet of your 4mg  blue colored warfarin tablets by mouth once-daily on Mondays, and Fridays; all other days, take only 1/2 tablet Patient verbalized understanding of these instructions.     Patient advised to contact clinic or seek medical attention if signs/symptoms of bleeding or thromboembolism occur.  Patient verbalized understanding by repeating back information and was advised to contact me if further medication-related questions arise. Patient was also provided an information handout.  Follow-up Return in about 3 weeks (around 07/12/2017) for INR follow up at 1030.  Bertis Ruddy, PharmD Pharmacy Resident Pager #: [phone] 06/21/2017 10:44 AM  15 minutes spent face-to-face with the patient during the encounter. 50% of time spent on education. 50% of time was spent on INR point of care testing, results interpretation, and documentation .

## 2017-06-21 NOTE — Progress Notes (Signed)
Indication: Factor II deficiency. Duration: Indefinite. INR: Above target. Agree with Dr. Fletcher Anon assessment and plan.

## 2017-07-12 ENCOUNTER — Ambulatory Visit (INDEPENDENT_AMBULATORY_CARE_PROVIDER_SITE_OTHER): Payer: Medicare Other | Admitting: Pharmacist

## 2017-07-12 DIAGNOSIS — Z7901 Long term (current) use of anticoagulants: Secondary | ICD-10-CM | POA: Diagnosis not present

## 2017-07-12 DIAGNOSIS — D689 Coagulation defect, unspecified: Secondary | ICD-10-CM

## 2017-07-12 DIAGNOSIS — Z86718 Personal history of other venous thrombosis and embolism: Secondary | ICD-10-CM

## 2017-07-12 LAB — POCT INR: INR: 2.4

## 2017-07-12 NOTE — Patient Instructions (Signed)
Patient instructed to take medications as defined in the Anti-coagulation Track section of this encounter.  Patient instructed to take today's dose.  Patient instructed to take  1 tablet of your 4mg  blue colored warfarin tablets by mouth once-daily on Mondays, and Fridays; all other days, take only 1/2 tablet. Patient verbalized understanding of these instructions.

## 2017-07-12 NOTE — Progress Notes (Signed)
Anticoagulation Management Pamela [name] is a 72 y.o. female who reports to the clinic for monitoring of warfarin treatment.    Indication: Clotting disorder ([facility])[D68.9], DVT, History of [Z86.718], long term (current) use of anticoagulants.  Duration: indefinite Supervising physician: Gilles Chiquito  Anticoagulation Clinic Visit History: Patient does not report signs/symptoms of bleeding or thromboembolism  Other recent changes: No diet, medications, lifestyle changes endorsed by patient at this visit.  Anticoagulation Episode Summary    Current INR goal:   2.0-3.0  TTR:   70.5 % (6.9 y)  Next INR check:   08/09/2017  INR from last check:   2.40 (07/12/2017)  Weekly max warfarin dose:     Target end date:   Indefinite  [facility] check [facility]   [facility]  Preferred lab:     Send INR reminders to:   [facility]   Indications   Clotting disorder ([place]) [D68.9] DVT HX OF (Resolved) [P95.093]       Comments:         Anticoagulation Care Providers    Provider Role Specialty Phone number   Burman Freestone, MD  Internal Medicine [phone]      Allergies  Allergen Reactions  . Ace Inhibitors Cough   Prior to Admission medications   Medication Sig Start Date End Date Taking? Authorizing Provider  colestipol (COLESTID) 1 g tablet TAKE 1 TABLET BY MOUTH QD 02/11/17  Yes Bartholomew Crews, MD  indomethacin (INDOCIN) 25 MG capsule Prescribed by podiatry for use PRN 11/06/16  Yes Bartholomew Crews, MD  levothyroxine (SYNTHROID, LEVOTHROID) 88 MCG tablet TAKE ONE (1) TABLET BY MOUTH EVERY DAY 12/10/16  Yes Bartholomew Crews, MD  losartan (COZAAR) 25 MG tablet TAKE ONE (1) TABLET BY MOUTH EVERY DAY 12/03/16  Yes Bartholomew Crews, MD  Multiple Vitamins-Calcium (DAILY COMBO MULTIVITS/CALCIUM) TABS Take 1 tablet by mouth daily.     Yes [provider]  naproxen (NAPROSYN) 500 MG tablet Take 1 tablet (500 mg total) by mouth 2 (two) times daily as needed. 11/02/16  Yes  Bartholomew Crews, MD  pantoprazole (PROTONIX) 40 MG tablet TAKE ONE (1) TABLET BY MOUTH EVERY DAY 09/25/16  Yes Bartholomew Crews, MD  warfarin (COUMADIN) 4 MG tablet Take one tablet on Mon / Wed / Fri. Take one-half tablet all other days. Dose will change frequently. 11/02/16  Yes Bartholomew Crews, MD   Past Medical History:  Diagnosis Date  . CHOLELITHIASIS, WITH OBSTRUCTION 04/21/2006   s/p ERCP,sprincterotomy, stent ([facility])  . 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 GLAUCOMA 04/24/2008  . HYPERLIPIDEMIA 06/03/2006  . Hypothyroidism lifelong  . OBESITY, MILD 04/24/2008  . Tenosynovitis 01/2004   [facility]   Social History   Socioeconomic History  . Marital status: Widowed    Spouse name: Not on file  . Number of children: Not on file  . Years of education: Not on file  . Highest education level: Not on file  Social Needs  . Financial resource strain: Not on file  . Food insecurity - worry: Not on file  . Food insecurity - inability: Not on file  . Transportation needs - medical: Not on file  . Transportation needs - non-medical: Not on file  Occupational History  . 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 Smoking status: Former Smoker    Packs/day: 1.00  Years: 20.00    Pack years: 20.00    Last attempt to quit: 07/06/1985    Years since quitting: 32.0  . Tobacco comment: Quit 1988  Substance and Sexual Activity  . Alcohol use: Yes    Comment: 2-3 glasses of wine daily.  . Drug use: No  . Sexual activity: Not on file  Other Topics Concern  . Not on file  Social History Narrative   Married, no regular exercise.   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    Family History  Problem Relation Age of Onset  . Cancer Mother        H&N, smoker  . Cancer Sister         lung, 2011  . Other Other        grandmother had mastectomy in her 16's unknown reason  . Breast cancer Neg Hx     ASSESSMENT Recent Results: The most recent result is correlated with 18 mg per week: Lab Results  Component Value Date   INR 2.40 07/12/2017   INR 4.00 06/21/2017   INR 2.00 05/24/2017    Anticoagulation Dosing: Description   Take 1 tablet of your 4mg  blue colored warfarin tablets by mouth once-daily on Mondays, and Fridays; all other days, take only 1/2 tablet.      INR today: Therapeutic  PLAN Weekly dose was unchanged.  Patient Instructions  Patient instructed to take medications as defined in the Anti-coagulation Track section of this encounter.  Patient instructed to take today's dose.  Patient instructed to take  1 tablet of your 4mg  blue colored warfarin tablets by mouth once-daily on Mondays, and Fridays; all other days, take only 1/2 tablet. Patient verbalized understanding of these instructions.     Patient advised to contact clinic or seek medical attention if signs/symptoms of bleeding or thromboembolism occur.  Patient verbalized understanding by repeating back information and was advised to contact me if further medication-related questions arise. Patient was also provided an information handout.  Follow-up Return in 4 weeks (on 08/09/2017) for Follow up INR at 1015h.  Pennie Banter, PharmD, CACP, CPP  15 minutes spent face-to-face with the patient during the encounter. 50% of time spent on education. 50% of time was spent on fingerstick point of care INR sample collection, processing, results determination and documentation in [email or web address].

## 2017-07-13 NOTE — Progress Notes (Signed)
I reviewed Dr. Gladstone Pih note.  INR at goal.  No change in therapy.

## 2017-07-19 ENCOUNTER — Encounter (HOSPITAL_COMMUNITY): Payer: Self-pay | Admitting: Emergency Medicine

## 2017-07-19 ENCOUNTER — Other Ambulatory Visit: Payer: Self-pay

## 2017-07-19 ENCOUNTER — Ambulatory Visit (HOSPITAL_COMMUNITY)
Admission: EM | Admit: 2017-07-19 | Discharge: 2017-07-19 | Disposition: A | Discharge status: Home / Self Care | Payer: Medicare Other | Attending: Internal Medicine | Admitting: Internal Medicine

## 2017-07-19 DIAGNOSIS — H6121 Impacted cerumen, right ear: Secondary | ICD-10-CM

## 2017-07-19 MED ORDER — FLUTICASONE PROPIONATE 50 MCG/ACT NA SUSP: 2.0000 | Freq: Every day | NASAL | 0 refills | Status: DC

## 2017-07-19 NOTE — ED Triage Notes (Signed)
Right ear fullness, onset one week ago.  Patient has a cough.  Unable to hear out of right ear

## 2017-07-19 NOTE — Discharge Instructions (Signed)
Earwax removed today.  As discussed, other causes of ear fullness could be due to eustachian tube dysfunction given your recent cold symptoms.  Start Flonase as directed.  Follow-up here or with PCP for reevaluation if experiencing ear pain, ear drainage.

## 2017-07-19 NOTE — ED Provider Notes (Signed)
[place]    CSN: [id number] Arrival date & time: 07/19/17  1241     History   Chief Complaint Chief Complaint  Patient presents with  . Ear Fullness    HPI Pamela [name] is a 72 y.o. female.   72 year old female comes in for one-week history of right ear fullness.  States she had URI symptoms for the past few weeks, had cough, nasal congestion, rhinorrhea.  However, states symptoms have since improved/resolved, but residual right ear fullness.  Denies fever, chills, night sweats.  Denies chest pain, shortness of breath, palpitations, weakness, dizziness.  Patient with history of factor II deficiency, on Coumadin, states last INR a week ago that was within range.      Past Medical History:  Diagnosis Date  . CHOLELITHIASIS, WITH OBSTRUCTION 04/21/2006   s/p ERCP,sprincterotomy, stent ([facility])  . 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 GLAUCOMA 04/24/2008  . HYPERLIPIDEMIA 06/03/2006  . Hypothyroidism lifelong  . OBESITY, MILD 04/24/2008  . Tenosynovitis 01/2004   [place]    Patient Active Problem List   Diagnosis Date Noted  . History of gout 03/04/2015  . Healthcare maintenance 09/27/2013  . 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 GERD (gastroesophageal reflux disease) 03/03/2011  . Obesity (BMI 30.0-34.9) 04/24/2008  . GLAUCOMA 04/24/2008  . Osteopenia of the elderly 04/24/2008  . Essential hypertension, benign 04/24/2008  . Clotting disorder ([place]) 06/03/2006  . Hypothyroidism 04/21/2006    Past Surgical History:  Procedure Laterality Date  . CHOLECYSTECTOMY     2004    OB History    No data available       Home Medications    Prior to Admission medications   Medication Sig Start Date End Date Taking? Authorizing Provider  colestipol (COLESTID) 1 g tablet TAKE 1 TABLET BY MOUTH QD 02/11/17   Bartholomew Crews, MD  fluticasone [facility]) 50 MCG/ACT nasal spray Place 2 sprays  into both nostrils daily. 07/19/17   Ok Edwards, PA-C  indomethacin (INDOCIN) 25 MG capsule Prescribed by podiatry for use PRN 11/06/16   Bartholomew Crews, MD  levothyroxine (SYNTHROID, LEVOTHROID) 88 MCG tablet TAKE ONE (1) TABLET BY MOUTH EVERY DAY 12/10/16   Bartholomew Crews, MD  losartan (COZAAR) 25 MG tablet TAKE ONE (1) TABLET BY MOUTH EVERY DAY 12/03/16   Bartholomew Crews, MD  Multiple Vitamins-Calcium (DAILY COMBO MULTIVITS/CALCIUM) TABS Take 1 tablet by mouth daily.      [provider]  naproxen (NAPROSYN) 500 MG tablet Take 1 tablet (500 mg total) by mouth 2 (two) times daily as needed. 11/02/16   Bartholomew Crews, MD  pantoprazole (PROTONIX) 40 MG tablet TAKE ONE (1) TABLET BY MOUTH EVERY DAY 09/25/16   Bartholomew Crews, MD  warfarin (COUMADIN) 4 MG tablet Take one tablet on Mon / Wed / Fri. Take one-half tablet all other days. Dose will change frequently. 11/02/16   Bartholomew Crews, MD    Family History Family History  Problem Relation Age of Onset  . Cancer Mother        H&N, smoker  . Cancer Sister        lung, 2011  . Other Other        grandmother had mastectomy in her 47's unknown reason  . Breast cancer Neg Hx     Social History Social History   Tobacco Use  . Smoking status: Former Smoker  Packs/day: 1.00    Years: 20.00    Pack years: 20.00    Last attempt to quit: 07/06/1985    Years since quitting: 32.0  . Tobacco comment: Quit 1988  Substance Use Topics  . Alcohol use: Yes    Comment: 2-3 glasses of wine daily.  . Drug use: No     Allergies   Ace inhibitors   Review of Systems Review of Systems  Reason unable to perform ROS: See HPI as above.     Physical Exam Triage Vital Signs ED Triage Vitals  Enc Vitals Group     BP 07/19/17 1321 136/63     Pulse Rate 07/19/17 1321 (!) 109     Resp 07/19/17 1321 18     Temp 07/19/17 1321 98.5 F (36.9 C)     Temp Source 07/19/17 1321 Oral     SpO2 07/19/17 1321 95 %      Weight --      Height --      Head Circumference --      Peak Flow --      Pain Score 07/19/17 1320 2     Pain Loc --      Pain Edu? --      Excl. in [place]? --    No data found.  Updated Vital Signs BP 121/75 (BP Location: Left Arm)   Pulse (!) 104   Temp 99.1 F (37.3 C) (Oral)   Resp 20   SpO2 98%   Physical Exam  Constitutional: She is oriented to person, place, and time. She appears well-developed and well-nourished. No distress.  HENT:  Head: Normocephalic and atraumatic.  Right Ear: External ear and ear canal normal.  Left Ear: Tympanic membrane, external ear and ear canal normal. Tympanic membrane is not erythematous and not bulging.  Right cerumen impaction, TM not visible.  Post irrigation: Patient with erythematous ear canal and TM. TM not bulging.   Eyes: Conjunctivae are normal. Pupils are equal, round, and reactive to light.  Neurological: She is alert and oriented to person, place, and time.     UC Treatments / Results  Labs (all labs ordered are listed, but only abnormal results are displayed) Labs Reviewed - No data to display  Lab Results  Component Value Date   INR 2.40 07/12/2017   INR 4.00 06/21/2017   INR 2.00 05/24/2017     EKG  EKG Interpretation None       Radiology No results found.  Procedures Procedures (including critical care time)  Medications Ordered in UC Medications - No data to display   Initial Impression / Assessment and Plan / UC Course  I have reviewed the triage vital signs and the nursing notes.  Pertinent labs & imaging results that were available during my care of the patient were reviewed by me and considered in my medical decision making (see chart for details).    Patient with slight improvement of symptoms after irrigation.  Discussed ear canal and TM erythematous female could be due to irrigation.  Discussed possible eustachian tube dysfunction given recent URI symptoms.  Start Flonase as directed. Push  fluids.   Patient with slight tachycardia during triage, repeat vital signs showed improve tachycardia.  Patient with history of factor II deficiency on Coumadin, last INR within  therapeutic range.  She denies any chest pain, shortness of breath, palpitations, weakness, dizziness.  Patient to monitor, strict return precautions given.  Patient expresses understanding and agrees to plan.  Final  Clinical Impressions(s) / UC Diagnoses   Final diagnoses:  Impacted cerumen of right ear    ED Discharge Orders        Ordered    fluticasone (FLONASE) 50 MCG/ACT nasal spray  Daily     07/19/17 1402        Ok Edwards, PA-C 07/19/17 1427

## 2017-08-05 ENCOUNTER — Encounter: Payer: Self-pay | Admitting: Internal Medicine

## 2017-08-05 ENCOUNTER — Other Ambulatory Visit: Payer: Self-pay | Admitting: Internal Medicine

## 2017-08-09 ENCOUNTER — Ambulatory Visit (INDEPENDENT_AMBULATORY_CARE_PROVIDER_SITE_OTHER): Payer: Medicare Other | Admitting: Pharmacist

## 2017-08-09 DIAGNOSIS — Z7901 Long term (current) use of anticoagulants: Secondary | ICD-10-CM | POA: Diagnosis not present

## 2017-08-09 DIAGNOSIS — D689 Coagulation defect, unspecified: Secondary | ICD-10-CM | POA: Diagnosis not present

## 2017-08-09 LAB — POCT INR: INR: 2

## 2017-08-09 NOTE — Patient Instructions (Signed)
Patient instructed to take medications as defined in the Anti-coagulation Track section of this encounter.  Patient instructed to take today's dose.  Patient instructed to take 1 tablet of your 4mg  blue colored warfarin tablets by mouth once-daily on Mondays, Wednesdays, and Fridays; all other days, take only 1/2 tablet.  Patient verbalized understanding of these instructions.

## 2017-08-09 NOTE — Progress Notes (Signed)
Anticoagulation Management Pamela [name] is a 72 y.o. female who reports to the clinic for monitoring of warfarin treatment.    Indication: DVT  Duration: indefinite Supervising physician: Larey Dresser  Anticoagulation Clinic Visit History: Patient does not report signs/symptoms of bleeding or thromboembolism  Other recent changes: No changes reported in diet, medications, lifestyle Anticoagulation Episode Summary    Current INR goal:   2.0-3.0  TTR:   70.8 % (6.9 y)  Next INR check:   08/30/2017  INR from last check:   2.0 (08/09/2017)  Weekly max warfarin dose:     Target end date:   Indefinite  INR check location:   [facility]  Preferred lab:     Send INR reminders to:   [facility]   Indications   Clotting disorder ([place]) [D68.9] DVT HX OF (Resolved) [F09.323]       Comments:         Anticoagulation Care Providers    Provider Role Specialty Phone number   Burman Freestone, MD  Internal Medicine [phone]      Allergies  Allergen Reactions  . Ace Inhibitors Cough   Prior to Admission medications   Medication Sig Start Date End Date Taking? Authorizing Provider  colestipol (COLESTID) 1 g tablet TAKE 1 TABLET BY MOUTH QD 02/11/17   Bartholomew Crews, MD  fluticasone [facility]) 50 MCG/ACT nasal spray Place 2 sprays into both nostrils daily. 07/19/17   Ok Edwards, PA-C  indomethacin (INDOCIN) 25 MG capsule Prescribed by podiatry for use PRN 11/06/16   Bartholomew Crews, MD  levothyroxine (SYNTHROID, LEVOTHROID) 88 MCG tablet TAKE ONE (1) TABLET BY MOUTH EVERY DAY 12/10/16   Bartholomew Crews, MD  losartan (COZAAR) 25 MG tablet TAKE ONE (1) TABLET BY MOUTH EVERY DAY 12/03/16   Bartholomew Crews, MD  Multiple Vitamins-Calcium (DAILY COMBO MULTIVITS/CALCIUM) TABS Take 1 tablet by mouth daily.      [provider]  naproxen (NAPROSYN) 500 MG tablet Take 1 tablet (500 mg total) by mouth 2 (two) times daily as needed. 11/02/16   Bartholomew Crews,  MD  pantoprazole (PROTONIX) 40 MG tablet TAKE ONE (1) TABLET BY MOUTH EVERY DAY 09/25/16   Bartholomew Crews, MD  warfarin (COUMADIN) 4 MG tablet Take one tablet on Mon / Wed / Fri. Take one-half tablet all other days. Dose will change frequently. 11/02/16   Bartholomew Crews, MD  warfarin (COUMADIN) 4 MG tablet TAKE 1 TABLET BY MOUTH ON MONDAYS AND FRIDAYS.  TAKE 1/2 A TABLET BY MOUTH ALL OTHER DAYS. 08/05/17   Bartholomew Crews, MD   Past Medical History:  Diagnosis Date  . CHOLELITHIASIS, WITH OBSTRUCTION 04/21/2006   s/p ERCP,sprincterotomy, stent ([facility])  . 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 GLAUCOMA 04/24/2008  . HYPERLIPIDEMIA 06/03/2006  . Hypothyroidism lifelong  . OBESITY, MILD 04/24/2008  . Tenosynovitis 01/2004   [place]   Social History   Socioeconomic History  . Marital status: Widowed    Spouse name: Not on file  . Number of children: Not on file  . Years of education: Not on file  . Highest education level: Not on file  Social Needs  . Financial resource strain: Not on file  . Food insecurity - worry: Not on file  . Food insecurity - inability: Not on file  . Transportation needs - medical: Not on file  . Transportation needs - non-medical: Not on file  Occupational  History  . 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 Smoking status: Former Smoker    Packs/day: 1.00    Years: 20.00    Pack years: 20.00    Last attempt to quit: 07/06/1985    Years since quitting: 32.1  . Tobacco comment: Quit 1988  Substance and Sexual Activity  . Alcohol use: Yes    Comment: 2-3 glasses of wine daily.  . Drug use: No  . Sexual activity: Not on file  Other Topics Concern  . Not on file  Social History Narrative   Married, no regular exercise.   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    Family History  Problem Relation Age of Onset  . Cancer Mother        H&N, smoker  . Cancer Sister        lung, 2011  . Other Other        grandmother had mastectomy in her 25's unknown reason  . Breast cancer Neg Hx     ASSESSMENT Recent Results: The most recent result is correlated with 18 mg per week: Lab Results  Component Value Date   INR 2.0 08/09/2017   INR 2.40 07/12/2017   INR 4.00 06/21/2017    Anticoagulation Dosing: Description   Take 1 tablet of your 4mg  blue colored warfarin tablets by mouth once-daily on Mondays, Wednesdays, and Fridays; all other days, take only 1/2 tablet.      INR today: Therapeutic  PLAN Weekly dose was increased by 11% to 20 mg per week  Patient Instructions  Patient instructed to take medications as defined in the Anti-coagulation Track section of this encounter.  Patient instructed to take today's dose.  Patient instructed to take 1 tablet of your 4mg  blue colored warfarin tablets by mouth once-daily on Mondays, Wednesdays, and Fridays; all other days, take only 1/2 tablet.  Patient verbalized understanding of these instructions.     Patient advised to contact clinic or seek medical attention if signs/symptoms of bleeding or thromboembolism occur.  Patient verbalized understanding by repeating back information and was advised to contact me if further medication-related questions arise. Patient was also provided an information handout.  Follow-up Return in 3 weeks (on 08/30/2017) for Follow up INR at 1000.  Mila Merry Gerarda Fraction, PharmD PGY1 Pharmacy Resident Pager: [phone]  15 minutes spent face-to-face with the patient during the encounter. 50% of time spent on education. 50% of time was spent on point of care INR testing, results interpretation, dosage adjustment, and documentation in [facility] and [email or web address]

## 2017-08-30 ENCOUNTER — Ambulatory Visit (INDEPENDENT_AMBULATORY_CARE_PROVIDER_SITE_OTHER): Payer: Medicare Other | Admitting: Pharmacist

## 2017-08-30 DIAGNOSIS — Z86718 Personal history of other venous thrombosis and embolism: Secondary | ICD-10-CM | POA: Diagnosis not present

## 2017-08-30 DIAGNOSIS — Z5181 Encounter for therapeutic drug level monitoring: Secondary | ICD-10-CM | POA: Diagnosis not present

## 2017-08-30 DIAGNOSIS — Z7901 Long term (current) use of anticoagulants: Secondary | ICD-10-CM

## 2017-08-30 DIAGNOSIS — D689 Coagulation defect, unspecified: Secondary | ICD-10-CM | POA: Diagnosis not present

## 2017-08-30 LAB — POCT INR: INR: 2.4

## 2017-08-30 NOTE — Progress Notes (Signed)
Anticoagulation Management Pamela [name] is a 72 y.o. female who reports to the clinic for monitoring of warfarin treatment.    Indication: clotting disorder Duration: indefinite Supervising physician: Gilles Chiquito  Anticoagulation Clinic Visit History: Patient does not report signs/symptoms of bleeding or thromboembolism  Other recent changes: No reported changes in diet, medications, lifestyle Anticoagulation Episode Summary    Current INR goal:   2.0-3.0  TTR:   71.0 % (7 y)  Next INR check:   09/20/2017  INR from last check:   2.4 (08/30/2017)  Weekly max warfarin dose:     Target end date:   Indefinite  INR check location:   [facility]  Preferred lab:     Send INR reminders to:   [facility]   Indications   Clotting disorder ([place]) [D68.9] DVT HX OF (Resolved) [G38.756]       Comments:         Anticoagulation Care Providers    Provider Role Specialty Phone number   Burman Freestone, MD  Internal Medicine [phone]      Allergies  Allergen Reactions  . Ace Inhibitors Cough   Prior to Admission medications   Medication Sig Start Date End Date Taking? Authorizing Provider  colestipol (COLESTID) 1 g tablet TAKE 1 TABLET BY MOUTH QD 02/11/17  Yes Bartholomew Crews, MD  fluticasone [facility]) 50 MCG/ACT nasal spray Place 2 sprays into both nostrils daily. 07/19/17  Yes Yu, Amy V, PA-C  indomethacin (INDOCIN) 25 MG capsule Prescribed by podiatry for use PRN 11/06/16  Yes Bartholomew Crews, MD  levothyroxine (SYNTHROID, LEVOTHROID) 88 MCG tablet TAKE ONE (1) TABLET BY MOUTH EVERY DAY 12/10/16  Yes Bartholomew Crews, MD  losartan (COZAAR) 25 MG tablet TAKE ONE (1) TABLET BY MOUTH EVERY DAY 12/03/16  Yes Bartholomew Crews, MD  Multiple Vitamins-Calcium (DAILY COMBO MULTIVITS/CALCIUM) TABS Take 1 tablet by mouth daily.     Yes [provider]  naproxen (NAPROSYN) 500 MG tablet Take 1 tablet (500 mg total) by mouth 2 (two) times daily as needed. 11/02/16   Yes Bartholomew Crews, MD  pantoprazole (PROTONIX) 40 MG tablet TAKE ONE (1) TABLET BY MOUTH EVERY DAY 09/25/16  Yes Bartholomew Crews, MD  warfarin (COUMADIN) 4 MG tablet Take one tablet on Mon / Wed / Fri. Take one-half tablet all other days. Dose will change frequently. 11/02/16  Yes Bartholomew Crews, MD  warfarin (COUMADIN) 4 MG tablet TAKE 1 TABLET BY MOUTH ON MONDAYS AND FRIDAYS.  TAKE 1/2 A TABLET BY MOUTH ALL OTHER DAYS. 08/05/17  Yes Bartholomew Crews, MD   Past Medical History:  Diagnosis Date  . CHOLELITHIASIS, WITH OBSTRUCTION 04/21/2006   s/p ERCP,sprincterotomy, stent ([facility])  . 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 GLAUCOMA 04/24/2008  . HYPERLIPIDEMIA 06/03/2006  . Hypothyroidism lifelong  . OBESITY, MILD 04/24/2008  . Tenosynovitis 01/2004   [place]   Social History   Socioeconomic History  . Marital status: Widowed    Spouse name: Not on file  . Number of children: Not on file  . Years of education: Not on file  . Highest education level: Not on file  Social Needs  . Financial resource strain: Not on file  . Food insecurity - worry: Not on file  . Food insecurity - inability: Not on file  . Transportation needs - medical: Not on file  . Transportation needs - non-medical: Not on file  Occupational  History  . 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 Smoking status: Former Smoker    Packs/day: 1.00    Years: 20.00    Pack years: 20.00    Last attempt to quit: 07/06/1985    Years since quitting: 32.1  . Tobacco comment: Quit 1988  Substance and Sexual Activity  . Alcohol use: Yes    Comment: 2-3 glasses of wine daily.  . Drug use: No  . Sexual activity: Not on file  Other Topics Concern  . Not on file  Social History Narrative   Married, no regular exercise.   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    Family History  Problem Relation Age of Onset  . Cancer Mother        H&N, smoker  . Cancer Sister        lung, 2011  . Other Other        grandmother had mastectomy in her 74's unknown reason  . Breast cancer Neg Hx     ASSESSMENT Recent Results: The most recent result is correlated with 20 mg per week: Lab Results  Component Value Date   INR 2.4 08/30/2017   INR 2.0 08/09/2017   INR 2.40 07/12/2017    Anticoagulation Dosing: Description   Take 1 tablet of your 4mg  blue colored warfarin tablets by mouth once-daily on Mondays, Wednesdays, and Fridays; all other days, take only 1/2 tablet.      INR today: Therapeutic  PLAN Weekly dose was unchanged   Patient Instructions  Patient instructed to take medications as defined in the Anti-coagulation Track section of this encounter.  Patient instructed to take today's dose.  Patient instructed to take 1 tablet of your 4mg  blue colored warfarin tablets by mouth once-daily on Mondays, Wednesdays, and Fridays; all other days, take only 1/2 tablet.  Patient verbalized understanding of these instructions.  Patient advised to contact clinic or seek medical attention if signs/symptoms of bleeding or thromboembolism occur.  Patient verbalized understanding by repeating back information and was advised to contact me if further medication-related questions arise. Patient was also provided an information handout.  Follow-up Return in 3 weeks (on 09/20/2017) for Follow up INR at 1030.  Mila Merry Gerarda Fraction, PharmD PGY1 Pharmacy Resident Pager: [phone]  15 minutes spent face-to-face with the patient during the encounter. 50% of time spent on education. 50% of time was spent on point of care INR test, results interpretation, dose adjustment, and documentation in [facility] and [email or web address]

## 2017-08-30 NOTE — Patient Instructions (Signed)
Patient instructed to take medications as defined in the Anti-coagulation Track section of this encounter.  Patient instructed to take today's dose.  Patient instructed to take 1 tablet of your 4mg  blue colored warfarin tablets by mouth once-daily on Mondays, Wednesdays, and Fridays; all other days, take only 1/2 tablet.  Patient verbalized understanding of these instructions.

## 2017-08-30 NOTE — Progress Notes (Signed)
I reviewed the coumadin clinic note.  Patient has a clotting disorder, is on coumadin.  INR at goal.

## 2017-09-20 ENCOUNTER — Ambulatory Visit (INDEPENDENT_AMBULATORY_CARE_PROVIDER_SITE_OTHER): Payer: Medicare Other | Admitting: Pharmacist

## 2017-09-20 ENCOUNTER — Other Ambulatory Visit: Payer: Self-pay | Admitting: Internal Medicine

## 2017-09-20 DIAGNOSIS — Z5181 Encounter for therapeutic drug level monitoring: Secondary | ICD-10-CM

## 2017-09-20 DIAGNOSIS — D689 Coagulation defect, unspecified: Secondary | ICD-10-CM | POA: Diagnosis not present

## 2017-09-20 DIAGNOSIS — Z86718 Personal history of other venous thrombosis and embolism: Secondary | ICD-10-CM | POA: Diagnosis not present

## 2017-09-20 DIAGNOSIS — Z7901 Long term (current) use of anticoagulants: Secondary | ICD-10-CM | POA: Diagnosis not present

## 2017-09-20 LAB — POCT INR: INR: 3

## 2017-09-20 NOTE — Progress Notes (Signed)
Anticoagulation Management Pamela [name] is a 72 y.o. female who reports to the clinic for monitoring of warfarin treatment.    Indication: clotting disorder  Duration: indefinite Supervising physician: Aldine Contes  Anticoagulation Clinic Visit History: Patient does not report signs/symptoms of bleeding or thromboembolism  Other recent changes: No changes reported in diet, medications, lifestyle Anticoagulation Episode Summary    Current INR goal:   2.0-3.0  TTR:   71.3 % (7.1 y)  Next INR check:   10/11/2017  INR from last check:   3 (09/20/2017)  Weekly max warfarin dose:     Target end date:   Indefinite  INR check location:   [facility]  Preferred lab:     Send INR reminders to:   [facility]   Indications   Clotting disorder ([place]) [D68.9] DVT HX OF (Resolved) [D92.426]       Comments:         Anticoagulation Care Providers    Provider Role Specialty Phone number   Burman Freestone, MD  Internal Medicine [phone]      Allergies  Allergen Reactions  . Ace Inhibitors Cough   Prior to Admission medications   Medication Sig Start Date End Date Taking? Authorizing Provider  colestipol (COLESTID) 1 g tablet TAKE 1 TABLET BY MOUTH QD 02/11/17  Yes Bartholomew Crews, MD  fluticasone [facility]) 50 MCG/ACT nasal spray Place 2 sprays into both nostrils daily. 07/19/17  Yes Yu, Amy V, PA-C  indomethacin (INDOCIN) 25 MG capsule Prescribed by podiatry for use PRN 11/06/16  Yes Bartholomew Crews, MD  levothyroxine (SYNTHROID, LEVOTHROID) 88 MCG tablet TAKE ONE (1) TABLET BY MOUTH EVERY DAY 12/10/16  Yes Bartholomew Crews, MD  losartan (COZAAR) 25 MG tablet TAKE ONE (1) TABLET BY MOUTH EVERY DAY 12/03/16  Yes Bartholomew Crews, MD  Multiple Vitamins-Calcium (DAILY COMBO MULTIVITS/CALCIUM) TABS Take 1 tablet by mouth daily.     Yes [provider]  naproxen (NAPROSYN) 500 MG tablet Take 1 tablet (500 mg total) by mouth 2 (two) times daily as needed.  11/02/16  Yes Bartholomew Crews, MD  pantoprazole (PROTONIX) 40 MG tablet TAKE ONE (1) TABLET BY MOUTH EVERY DAY 09/25/16  Yes Bartholomew Crews, MD  warfarin (COUMADIN) 4 MG tablet Take one tablet on Mon / Wed / Fri. Take one-half tablet all other days. Dose will change frequently. 11/02/16  Yes Bartholomew Crews, MD  warfarin (COUMADIN) 4 MG tablet TAKE 1 TABLET BY MOUTH ON MONDAYS AND FRIDAYS.  TAKE 1/2 A TABLET BY MOUTH ALL OTHER DAYS. 08/05/17  Yes Bartholomew Crews, MD   Past Medical History:  Diagnosis Date  . CHOLELITHIASIS, WITH OBSTRUCTION 04/21/2006   s/p ERCP,sprincterotomy, stent ([facility])  . 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 GLAUCOMA 04/24/2008  . HYPERLIPIDEMIA 06/03/2006  . Hypothyroidism lifelong  . OBESITY, MILD 04/24/2008  . Tenosynovitis 01/2004   Sypher   Social History   Socioeconomic History  . Marital status: Widowed    Spouse name: Not on file  . Number of children: Not on file  . Years of education: Not on file  . Highest education level: Not on file  Social Needs  . Financial resource strain: Not on file  . Food insecurity - worry: Not on file  . Food insecurity - inability: Not on file  . Transportation needs - medical: Not on file  . Transportation needs - non-medical: Not on file  Occupational History  . 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 Smoking status: Former Smoker    Packs/day: 1.00    Years: 20.00    Pack years: 20.00    Last attempt to quit: 07/06/1985    Years since quitting: 32.2  . Tobacco comment: Quit 1988  Substance and Sexual Activity  . Alcohol use: Yes    Comment: 2-3 glasses of wine daily.  . Drug use: No  . Sexual activity: Not on file  Other Topics Concern  . Not on file  Social History Narrative   Married, no regular exercise.   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    Family History  Problem Relation Age of Onset  . Cancer Mother        H&N, smoker  . Cancer Sister        lung, 2011  . Other Other        grandmother had mastectomy in her 32's unknown reason  . Breast cancer Neg Hx     ASSESSMENT Recent Results: The most recent result is correlated with 20 mg per week: Lab Results  Component Value Date   INR 3 09/20/2017   INR 2.4 08/30/2017   INR 2.0 08/09/2017    Anticoagulation Dosing: Description   Take 1 tablet of your 4mg  blue colored warfarin tablets by mouth once-daily on Mondays and Fridays; all other days, take only 1/2 tablet.      INR today: Therapeutic  PLAN Weekly dose was decreased by 10% to 18 mg per week  Patient Instructions  Patient instructed to take medications as defined in the Anti-coagulation Track section of this encounter.  Patient instructed to take today's dose.  Patient instructed to take 1 tablet of your 4mg  blue colored warfarin tablets by mouth once-daily on Mondays and Fridays; all other days, take only 1/2 tablet.  Patient verbalized understanding of these instructions.  Patient advised to contact clinic or seek medical attention if signs/symptoms of bleeding or thromboembolism occur.  Patient verbalized understanding by repeating back information and was advised to contact me if further medication-related questions arise. Patient was also provided an information handout.  Follow-up Return in 3 weeks (on 10/11/2017) for Follow up INR at 1000.  Mila Merry Gerarda Fraction, PharmD PGY1 Pharmacy Resident Pager: [phone]  15 minutes spent face-to-face with the patient during the encounter. 50% of time spent on education. 50% of time was spent on point of care INR testing, results interpretation, dose adjustment, and documentation in [facility] and [email or web address]

## 2017-09-20 NOTE — Patient Instructions (Signed)
Patient instructed to take medications as defined in the Anti-coagulation Track section of this encounter.  Patient instructed to take today's dose.  Patient instructed to take 1 tablet of your 4mg  blue colored warfarin tablets by mouth once-daily on Mondays and Fridays; all other days, take only 1/2 tablet.  Patient verbalized understanding of these instructions.

## 2017-09-20 NOTE — Progress Notes (Signed)
INTERNAL MEDICINE TEACHING ATTENDING ADDENDUM - Anthoney Sheppard M.D  Duration- indefinite, Indication- recurrent DVT, INR- therapeutic. Agree with pharmacy recommendations as outlined in their note.     

## 2017-10-05 ENCOUNTER — Other Ambulatory Visit: Payer: Self-pay

## 2017-10-05 ENCOUNTER — Ambulatory Visit (INDEPENDENT_AMBULATORY_CARE_PROVIDER_SITE_OTHER): Payer: Medicare Other | Admitting: Internal Medicine

## 2017-10-05 VITALS — BP 138/66 | HR 97 | Ht 64.0 in | Wt 190.5 lb

## 2017-10-05 DIAGNOSIS — Z87891 Personal history of nicotine dependence: Secondary | ICD-10-CM | POA: Diagnosis not present

## 2017-10-05 DIAGNOSIS — K219 Gastro-esophageal reflux disease without esophagitis: Secondary | ICD-10-CM

## 2017-10-05 DIAGNOSIS — D682 Hereditary deficiency of other clotting factors: Secondary | ICD-10-CM | POA: Diagnosis not present

## 2017-10-05 DIAGNOSIS — J209 Acute bronchitis, unspecified: Secondary | ICD-10-CM | POA: Diagnosis not present

## 2017-10-05 DIAGNOSIS — E039 Hypothyroidism, unspecified: Secondary | ICD-10-CM | POA: Diagnosis not present

## 2017-10-05 DIAGNOSIS — I10 Essential (primary) hypertension: Secondary | ICD-10-CM

## 2017-10-05 DIAGNOSIS — Z7901 Long term (current) use of anticoagulants: Secondary | ICD-10-CM | POA: Diagnosis not present

## 2017-10-05 MED ORDER — DM-GUAIFENESIN ER 30-600 MG PO TB12: 1.0000 | ORAL_TABLET | Freq: Two times a day (BID) | ORAL | 0 refills | Status: AC | PRN

## 2017-10-05 NOTE — Patient Instructions (Addendum)
FOLLOW-UP INSTRUCTIONS When: ~4 months with PCP For: BP management and health maintenance What to bring: medications   Pamela [name],  It was a pleasure to meet you today.  Your cough and other symptoms are most likely from acute bronchitis. The most common cause of acute bronchitis is a virus infection. The treatment for that is supportive.  - For your cough, you can take Mucinex-DM twice a day as needed. Like we talked about, with bronchitis, your cough may linger for 3-4 weeks. This medicine might be particularly helpful at night for controlling your cough so that you can hopefully get some sleep. - Please also make sure to stay hydrated with a lot of fluids, even if you do not feel you need it.  If your symptoms do not get better, please return to our clinic for further evaluation.  Please return to our clinic in ~4 months to see your primary doctor.    Acute Bronchitis, Adult Acute bronchitis is when air tubes (bronchi) in the lungs suddenly get swollen. The condition can make it hard to breathe. It can also cause these symptoms:  A cough.  Coughing up clear, yellow, or green mucus.  Wheezing.  Chest congestion.  Shortness of breath.  A fever.  Body aches.  Chills.  A sore throat.  Follow these instructions at home: Medicines  Take over-the-counter and prescription medicines only as told by your doctor.  If you were prescribed an antibiotic medicine, take it as told by your doctor. Do not stop taking the antibiotic even if you start to feel better. General instructions  Rest.  Drink enough fluids to keep your pee (urine) clear or pale yellow.  Avoid smoking and secondhand smoke. If you smoke and you need help quitting, ask your doctor. Quitting will help your lungs heal faster.  Use an inhaler, cool mist vaporizer, or humidifier as told by your doctor.  Keep all follow-up visits as told by your doctor. This is important. How is this prevented? To lower your  risk of getting this condition again:  Wash your hands often with soap and water. If you cannot use soap and water, use hand sanitizer.  Avoid contact with people who have cold symptoms.  Try not to touch your hands to your mouth, nose, or eyes.  Make sure to get the flu shot every year.  Contact a doctor if:  Your symptoms do not get better in 2 weeks. Get help right away if:  You cough up blood.  You have chest pain.  You have very bad shortness of breath.  You become dehydrated.  You faint (pass out) or keep feeling like you are going to pass out.  You keep throwing up (vomiting).  You have a very bad headache.  Your fever or chills gets worse. This information is not intended to replace advice given to you by your health care provider. Make sure you discuss any questions you have with your health care provider. Document Released: 12/09/2007 Document Revised: 01/29/2016 Document Reviewed: 12/11/2015 Elsevier Interactive Patient Education  [employer].

## 2017-10-05 NOTE — Progress Notes (Signed)
Internal Medicine Clinic Attending  Case discussed with Dr. Huang at the time of the visit.  We reviewed the resident's history and exam and pertinent patient test results.  I agree with the assessment, diagnosis, and plan of care documented in the resident's note. 

## 2017-10-05 NOTE — Assessment & Plan Note (Addendum)
Assessment BP 138/66, on losartan 25mg  daily. At goal.  Plan - Continue losartan 25mg  daily

## 2017-10-05 NOTE — Assessment & Plan Note (Addendum)
Assessment Presents with 1.5 weeks of productive cough with associated headache and wheezing. Exam notable for diffuse expiratory wheezes without crackles. Temp 100, but she just had a cup of coffee. Other vitals stable. Patient denies known underlying lung disease. Is a former smoker but only 20 pack year history and quit in 1988.  Will treat symptomatically for acute bronchitis 2/2 viral infection with cough suppressant and encourage PO hydration. Discussed expectations of lingering cough for several weeks. Patient advised to RTC if no improvement. Can consider imaging if not better.  Plan - Mucinex DM 30-600mg  BID PRN - Encouraged PO intake

## 2017-10-05 NOTE — Progress Notes (Signed)
   CC: cough  HPI:  [name] is a 72 y.o. 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  She has had a cough productive of NB clear sputum for the last 1.5 weeks. The cough is worse at night and associated with wheezing. She also endorses a headache that she thinks is due to the cough. She has tried Mucinex and cough drops for the cough and sore throat, without much relief. She reports that her symptoms got a little better last week mid-week, but that they subsequently worsened. Denies sick contacts or recent travel. She denies chest pain, sore throat, sinus pressure or tenderness, SOB, fevers, lightheadedness/dizziness, abdominal pain, or N/V. She does report decreased appetite and reduced PO intake over the last couple days. She denies a history of lung problems.  Past Medical History:  Diagnosis Date  . CHOLELITHIASIS, WITH OBSTRUCTION 04/21/2006   s/p ERCP,sprincterotomy, stent ([facility])  . Factor II deficiency ([place])    II mutation-[id number]-on chronic coumadin tx  . GLAUCOMA 04/24/2008  . HYPERLIPIDEMIA 06/03/2006  . Hypothyroidism lifelong  . OBESITY, MILD 04/24/2008  . Tenosynovitis 01/2004   Sypher   Review of Systems:   GEN: Negative for fevers NEURO: Negative for lightheadedness or dizziness. Positive for HA CV: Negative for chest pain PULM: Positive for cough and wheezing. Negative for SOB  Physical Exam:  Vitals:   10/05/17 0934  BP: 138/66  Pulse: 97  SpO2: 91%  Weight: 190 lb 8 oz (86.4 kg)  Height: 5\' 4"  (1.626 m)   GEN: Sitting in chair comfortably in NAD, intermittently coughing throughout exam HENT: No sinus pain or tenderness. MMM, no visible lesions, mild erythema of posterior pharynx EYES: PERRL. No conjunctival injection CV: NR & RR, no m/r/g PULM: Diffuse expiratory wheezes, no crackles EXT: No LE edema  Assessment & Plan:   See Encounters Tab for problem based  charting.  Patient discussed with Dr. Lynnae January

## 2017-10-11 ENCOUNTER — Ambulatory Visit (INDEPENDENT_AMBULATORY_CARE_PROVIDER_SITE_OTHER): Payer: Medicare Other | Admitting: Pharmacist

## 2017-10-11 DIAGNOSIS — Z7901 Long term (current) use of anticoagulants: Secondary | ICD-10-CM | POA: Diagnosis not present

## 2017-10-11 DIAGNOSIS — D689 Coagulation defect, unspecified: Secondary | ICD-10-CM | POA: Diagnosis not present

## 2017-10-11 DIAGNOSIS — Z86718 Personal history of other venous thrombosis and embolism: Secondary | ICD-10-CM

## 2017-10-11 DIAGNOSIS — Z5181 Encounter for therapeutic drug level monitoring: Secondary | ICD-10-CM | POA: Diagnosis not present

## 2017-10-11 LAB — POCT INR: INR: 2.2

## 2017-10-11 NOTE — Progress Notes (Signed)
Anticoagulation Management Pamela [name] is a 73 y.o. female who reports to the clinic for monitoring of warfarin treatment.    Indication: DVT, Hx of (Resolved) [Z86.718]; Clotting disorder ([place]) [D68.9]; long term (current) use of anticoagulant.  Duration: indefinite Supervising physician: Joni Reining  Anticoagulation Clinic Visit History: Patient does not report signs/symptoms of bleeding or thromboembolism  Other recent changes: No diet, medications, lifestyle changes other than as noted in patient findings section.  Anticoagulation Episode Summary    Current INR goal:   2.0-3.0  TTR:   71.5 % (7.1 y)  Next INR check:   11/08/2017  INR from last check:   2.20 (10/11/2017)  Weekly max warfarin dose:     Target end date:   Indefinite  INR check location:   Anticoagulation Clinic  Preferred lab:     Send INR reminders to:   [facility]   Indications   Clotting disorder ([place]) [D68.9] DVT HX OF (Resolved) [Z61.096]       Comments:         Anticoagulation Care Providers    Provider Role Specialty Phone number   Burman Freestone, MD  Internal Medicine [phone]      Allergies  Allergen Reactions  . Ace Inhibitors Cough   Prior to Admission medications   Medication Sig Start Date End Date Taking? Authorizing Provider  colestipol (COLESTID) 1 g tablet TAKE 1 TABLET BY MOUTH QD 02/11/17  Yes Bartholomew Crews, MD  dextromethorphan-guaiFENesin [facility] DM) 30-600 MG 12hr tablet Take 1 tablet by mouth 2 (two) times daily as needed for up to 15 days for cough. 10/05/17 10/20/17 Yes Colbert Ewing, MD  fluticasone [facility]) 50 MCG/ACT nasal spray Place 2 sprays into both nostrils daily. 07/19/17  Yes Yu, Amy V, PA-C  indomethacin (INDOCIN) 25 MG capsule Prescribed by podiatry for use PRN 11/06/16  Yes Bartholomew Crews, MD  levothyroxine (SYNTHROID, LEVOTHROID) 88 MCG tablet TAKE ONE (1) TABLET BY MOUTH EVERY DAY 12/10/16  Yes Bartholomew Crews, MD  losartan (COZAAR) 25 MG  tablet TAKE ONE (1) TABLET BY MOUTH EVERY DAY 12/03/16  Yes Bartholomew Crews, MD  Multiple Vitamins-Calcium (DAILY COMBO MULTIVITS/CALCIUM) TABS Take 1 tablet by mouth daily.     Yes [provider]  pantoprazole (PROTONIX) 40 MG tablet TAKE ONE (1) TABLET BY MOUTH EVERY DAY 09/20/17  Yes Bartholomew Crews, MD  warfarin (COUMADIN) 4 MG tablet TAKE 1 TABLET BY MOUTH ON MONDAYS AND FRIDAYS.  TAKE 1/2 A TABLET BY MOUTH ALL OTHER DAYS. 08/05/17  Yes Bartholomew Crews, MD  naproxen (NAPROSYN) 500 MG tablet Take 1 tablet (500 mg total) by mouth 2 (two) times daily as needed. Patient not taking: Reported on 10/11/2017 11/02/16   Bartholomew Crews, MD  warfarin (COUMADIN) 4 MG tablet Take one tablet on Mon / Wed / Fri. Take one-half tablet all other days. Dose will change frequently. Patient not taking: Reported on 10/11/2017 11/02/16   Bartholomew Crews, MD   Past Medical History:  Diagnosis Date  . CHOLELITHIASIS, WITH OBSTRUCTION 04/21/2006   s/p ERCP,sprincterotomy, stent ([facility])  . 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 GLAUCOMA 04/24/2008  . HYPERLIPIDEMIA 06/03/2006  . Hypothyroidism lifelong  . OBESITY, MILD 04/24/2008  . Tenosynovitis 01/2004   Sypher   Social History   Socioeconomic History  . Marital status: Widowed    Spouse name: Not on file  . Number of children: Not on file  .  Years of education: Not on file  . Highest education level: Not on file  Occupational History  . 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 Financial resource strain: Not on file  . Food insecurity:    Worry: Not on file    Inability: Not on file  . Transportation needs:    Medical: Not on file    Non-medical: Not on file  Tobacco Use  . Smoking status: Former Smoker    Packs/day: 1.00    Years: 20.00    Pack years: 20.00    Last attempt to quit: 07/06/1985    Years  since quitting: 32.2  . Tobacco comment: Quit 1988  Substance and Sexual Activity  . Alcohol use: Yes    Comment: 2-3 glasses of wine daily.  . Drug use: No  . Sexual activity: Not on file  Lifestyle  . Physical activity:    Days per week: Not on file    Minutes per session: Not on file  . Stress: Not on file  Relationships  . Social connections:    Talks on phone: Not on file    Gets together: Not on file    Attends religious service: Not on file    Active member of club or organization: Not on file    Attends meetings of clubs or organizations: Not on file    Relationship status: Not on file  Other Topics Concern  . Not on file  Social History Narrative   Married, no regular exercise.   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    Family History  Problem Relation Age of Onset  . Cancer Mother        H&N, smoker  . Cancer Sister        lung, 2011  . Other Other        grandmother had mastectomy in her 19's unknown reason  . Breast cancer Neg Hx     ASSESSMENT Recent Results: The most recent result is correlated with 18 mg per week: Lab Results  Component Value Date   INR 2.20 10/11/2017   INR 3 09/20/2017   INR 2.4 08/30/2017    Anticoagulation Dosing: Description   Take 1 tablet of your 4mg  blue colored warfarin tablets by mouth once-daily on Mondays and Fridays; all other days, take only 1/2 tablet.      INR today: Therapeutic  PLAN Weekly dose was unchanged.  Patient Instructions  Patient instructed to take medications as defined in the Anti-coagulation Track section of this encounter.  Patient instructed to take today's dose.  Patient instructed to take 1 tablet of your 4mg  blue colored warfarin tablets by mouth once-daily on Mondays and Fridays; all other days, take only 1/2 tablet.  Patient verbalized understanding of these instructions.     Patient advised to  contact clinic or seek medical attention if signs/symptoms of bleeding or thromboembolism occur.  Patient verbalized understanding by repeating back information and was advised to contact me if further medication-related questions arise. Patient was also provided an information handout.  Follow-up Return in 1 month (on 11/08/2017) for Follow up INR at 1015h.  Pennie Banter, PharmD, CACP, CPP  15 minutes spent face-to-face with the patient during the encounter. 50% of time spent on education. 50% of time was spent  on fingerstick point of care INR sample collection, processing, results determination and documentation in [email or web address].

## 2017-10-11 NOTE — Patient Instructions (Signed)
Patient instructed to take medications as defined in the Anti-coagulation Track section of this encounter.  Patient instructed to take today's dose.  Patient instructed to take 1 tablet of your 4mg  blue colored warfarin tablets by mouth once-daily on Mondays and Fridays; all other days, take only 1/2 tablet.  Patient verbalized understanding of these instructions.

## 2017-10-19 NOTE — Progress Notes (Signed)
INTERNAL MEDICINE TEACHING ATTENDING ADDENDUM - Lucious Groves, DO Duration- indefiante, Indication- recurrent VTE, INR-  Lab Results  Component Value Date   INR 2.20 10/11/2017  . Agree with pharmacy recommendations as outlined in their note.

## 2017-11-08 ENCOUNTER — Ambulatory Visit (INDEPENDENT_AMBULATORY_CARE_PROVIDER_SITE_OTHER): Payer: Medicare Other | Admitting: Pharmacist

## 2017-11-08 DIAGNOSIS — Z86718 Personal history of other venous thrombosis and embolism: Secondary | ICD-10-CM

## 2017-11-08 DIAGNOSIS — D689 Coagulation defect, unspecified: Secondary | ICD-10-CM | POA: Diagnosis not present

## 2017-11-08 DIAGNOSIS — Z7901 Long term (current) use of anticoagulants: Secondary | ICD-10-CM

## 2017-11-08 DIAGNOSIS — Z5181 Encounter for therapeutic drug level monitoring: Secondary | ICD-10-CM | POA: Diagnosis not present

## 2017-11-08 LAB — POCT INR: INR: 2.1

## 2017-11-08 NOTE — Patient Instructions (Signed)
Patient instructed to take medications as defined in the Anti-coagulation Track section of this encounter.  Patient instructed to take today's dose.  Patient instructed to take 1 tablet of your 4mg  blue colored warfarin tablets by mouth once-daily on Mondays and Fridays; all other days, take only 1/2 tablet.  Patient verbalized understanding of these instructions.

## 2017-11-08 NOTE — Progress Notes (Signed)
Anticoagulation Management Pamela [name] is a 72 y.o. female who reports to the clinic for monitoring of warfarin treatment.    Indication: DVT, history of (resolved); long term current use of anticoagulants. Duration: indefinite Supervising physician: Aldine Contes  Anticoagulation Clinic Visit History: Patient does not report signs/symptoms of bleeding or thromboembolism  Other recent changes: No diet, medications, lifestyle changes endorsed.  Anticoagulation Episode Summary    Current INR goal:   2.0-3.0  TTR:   71.8 % (7.2 y)  Next INR check:   12/06/2017  INR from last check:   2.10 (11/08/2017)  Weekly max warfarin dose:     Target end date:   Indefinite  INR check location:   Anticoagulation Clinic  Preferred lab:     Send INR reminders to:   [facility]   Indications   Clotting disorder ([place]) [D68.9] DVT HX OF (Resolved) [Y86.578]       Comments:         Anticoagulation Care Providers    Provider Role Specialty Phone number   Burman Freestone, MD  Internal Medicine [phone]      Allergies  Allergen Reactions  . Ace Inhibitors Cough   Prior to Admission medications   Medication Sig Start Date End Date Taking? Authorizing Provider  colestipol (COLESTID) 1 g tablet TAKE 1 TABLET BY MOUTH QD 02/11/17  Yes Bartholomew Crews, MD  fluticasone [facility]) 50 MCG/ACT nasal spray Place 2 sprays into both nostrils daily. 07/19/17  Yes Yu, Amy V, PA-C  indomethacin (INDOCIN) 25 MG capsule Prescribed by podiatry for use PRN 11/06/16  Yes Bartholomew Crews, MD  levothyroxine (SYNTHROID, LEVOTHROID) 88 MCG tablet TAKE ONE (1) TABLET BY MOUTH EVERY DAY 12/10/16  Yes Bartholomew Crews, MD  losartan (COZAAR) 25 MG tablet TAKE ONE (1) TABLET BY MOUTH EVERY DAY 12/03/16  Yes Bartholomew Crews, MD  Multiple Vitamins-Calcium (DAILY COMBO MULTIVITS/CALCIUM) TABS Take 1 tablet by mouth daily.     Yes [provider]  naproxen (NAPROSYN) 500 MG tablet Take 1 tablet  (500 mg total) by mouth 2 (two) times daily as needed. 11/02/16  Yes Bartholomew Crews, MD  pantoprazole (PROTONIX) 40 MG tablet TAKE ONE (1) TABLET BY MOUTH EVERY DAY 09/20/17  Yes Bartholomew Crews, MD  warfarin (COUMADIN) 4 MG tablet Take one tablet on Mon / Wed / Fri. Take one-half tablet all other days. Dose will change frequently. 11/02/16  Yes Bartholomew Crews, MD  warfarin (COUMADIN) 4 MG tablet TAKE 1 TABLET BY MOUTH ON MONDAYS AND FRIDAYS.  TAKE 1/2 A TABLET BY MOUTH ALL OTHER DAYS. 08/05/17  Yes Bartholomew Crews, MD   Past Medical History:  Diagnosis Date  . CHOLELITHIASIS, WITH OBSTRUCTION 04/21/2006   s/p ERCP,sprincterotomy, stent ([facility])  . 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 GLAUCOMA 04/24/2008  . HYPERLIPIDEMIA 06/03/2006  . Hypothyroidism lifelong  . OBESITY, MILD 04/24/2008  . Tenosynovitis 01/2004   [place]   Social History   Socioeconomic History  . Marital status: Widowed    Spouse name: Not on file  . Number of children: Not on file  . Years of education: Not on file  . Highest education level: Not on file  Occupational History  . 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 Financial resource strain: Not on file  . Food insecurity:  Worry: Not on file    Inability: Not on file  . Transportation needs:    Medical: Not on file    Non-medical: Not on file  Tobacco Use  . Smoking status: Former Smoker    Packs/day: 1.00    Years: 20.00    Pack years: 20.00    Last attempt to quit: 07/06/1985    Years since quitting: 32.3  . Tobacco comment: Quit 1988  Substance and Sexual Activity  . Alcohol use: Yes    Comment: 2-3 glasses of wine daily.  . Drug use: No  . Sexual activity: Not on file  Lifestyle  . Physical activity:    Days per week: Not on file    Minutes per session: Not on file  . Stress: Not on file  Relationships  .  Social connections:    Talks on phone: Not on file    Gets together: Not on file    Attends religious service: Not on file    Active member of club or organization: Not on file    Attends meetings of clubs or organizations: Not on file    Relationship status: Not on file  Other Topics Concern  . Not on file  Social History Narrative   Married, no regular exercise.   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    Family History  Problem Relation Age of Onset  . Cancer Mother        H&N, smoker  . Cancer Sister        lung, 2011  . Other Other        grandmother had mastectomy in her 26's unknown reason  . Breast cancer Neg Hx     ASSESSMENT Recent Results: The most recent result is correlated with 18 mg per week: Lab Results  Component Value Date   INR 2.10 11/08/2017   INR 2.20 10/11/2017   INR 3 09/20/2017    Anticoagulation Dosing: Description   Take 1 tablet of your 4mg  blue colored warfarin tablets by mouth once-daily on Mondays and Fridays; all other days, take only 1/2 tablet.      INR today: Therapeutic  PLAN Weekly dose was unchanged.   Patient Instructions  Patient instructed to take medications as defined in the Anti-coagulation Track section of this encounter.  Patient instructed to take today's dose.  Patient instructed to take 1 tablet of your 4mg  blue colored warfarin tablets by mouth once-daily on Mondays and Fridays; all other days, take only 1/2 tablet.  Patient verbalized understanding of these instructions.     Patient advised to contact clinic or seek medical attention if signs/symptoms of bleeding or thromboembolism occur.  Patient verbalized understanding by repeating back information and was advised to contact me if further medication-related questions arise. Patient was also provided an information handout.  Follow-up Return in 1 month (on 12/06/2017) for  Follow up INR at 1000h.  Pennie Banter, PharmD, CACP, CPP  15 minutes spent face-to-face with the patient during the encounter. 50% of time spent on education. 50% of time was spent on fingerstick point of care INR sample collection, processing, results determination, and documentation in [email or web address].

## 2017-11-08 NOTE — Progress Notes (Signed)
INTERNAL MEDICINE TEACHING ATTENDING ADDENDUM - Jaquisha Frech M.D  Duration- indefinite, Indication- recurrent VTE, INR- therapeutic. Agree with pharmacy recommendations as outlined in their note.     

## 2017-11-22 ENCOUNTER — Other Ambulatory Visit: Payer: Self-pay | Admitting: Internal Medicine

## 2017-12-01 ENCOUNTER — Other Ambulatory Visit: Payer: Self-pay | Admitting: Internal Medicine

## 2017-12-06 ENCOUNTER — Ambulatory Visit (INDEPENDENT_AMBULATORY_CARE_PROVIDER_SITE_OTHER): Payer: Medicare Other | Admitting: Pharmacist

## 2017-12-06 DIAGNOSIS — Z86718 Personal history of other venous thrombosis and embolism: Secondary | ICD-10-CM | POA: Diagnosis not present

## 2017-12-06 DIAGNOSIS — D689 Coagulation defect, unspecified: Secondary | ICD-10-CM | POA: Diagnosis not present

## 2017-12-06 DIAGNOSIS — Z7901 Long term (current) use of anticoagulants: Secondary | ICD-10-CM | POA: Diagnosis not present

## 2017-12-06 LAB — POCT INR: INR: 2.2 (ref 2.0–3.0)

## 2017-12-06 NOTE — Patient Instructions (Signed)
Patient instructed to take medications as defined in the Anti-coagulation Track section of this encounter.  Patient instructed to take today's dose.  Patient instructed to take 1 tablet of your 4mg  blue colored warfarin tablets by mouth once-daily on Mondays and Fridays; all other days, take only 1/2 tablet.  Patient verbalized understanding of these instructions.

## 2017-12-06 NOTE — Progress Notes (Signed)
Anticoagulation Management Pamela [name] is a 72 y.o. female who reports to the clinic for monitoring of warfarin treatment.    Indication: Clotting disorder ([place]) [D68.9], DVT, History of (Resolved) [Z86.718]; Long term current use of anticoagulant.  Duration: indefinite Supervising physician: [place] Clinic Visit History: Patient does not report signs/symptoms of bleeding or thromboembolism  Other recent changes: No diet, medications, lifestyle changes endorsed by the patient at this visit.  Anticoagulation Episode Summary    Current INR goal:   2.0-3.0  TTR:   72.1 % (7.3 y)  Next INR check:   01/03/2018  INR from last check:   2.2 (12/06/2017)  Weekly max warfarin dose:     Target end date:   Indefinite  INR check location:   Anticoagulation Clinic  Preferred lab:     Send INR reminders to:   [facility]   Indications   Clotting disorder ([place]) [D68.9] DVT HX OF (Resolved) [W09.811]       Comments:         Anticoagulation Care Providers    Provider Role Specialty Phone number   Burman Freestone, MD  Internal Medicine [phone]      Allergies  Allergen Reactions  . Ace Inhibitors Cough   Prior to Admission medications   Medication Sig Start Date End Date Taking? Authorizing Provider  colestipol (COLESTID) 1 g tablet TAKE 1 TABLET BY MOUTH QD 02/11/17  Yes Bartholomew Crews, MD  colestipol (COLESTID) 1 g tablet TAKE ONE (1) TABLET BY MOUTH TWO (2) TIMES DAILY 11/22/17  Yes Bartholomew Crews, MD  fluticasone (FLONASE) 50 MCG/ACT nasal spray Place 2 sprays into both nostrils daily. 07/19/17  Yes Yu, Amy V, PA-C  indomethacin (INDOCIN) 25 MG capsule Prescribed by podiatry for use PRN 11/06/16  Yes Bartholomew Crews, MD  levothyroxine (SYNTHROID, LEVOTHROID) 88 MCG tablet TAKE ONE (1) TABLET BY MOUTH EVERY DAY 12/10/16  Yes Bartholomew Crews, MD  losartan (COZAAR) 25 MG tablet TAKE ONE (1) TABLET BY MOUTH EVERY DAY 12/01/17  Yes Bartholomew Crews, MD  Multiple Vitamins-Calcium (DAILY COMBO MULTIVITS/CALCIUM) TABS Take 1 tablet by mouth daily.     Yes [provider]  naproxen (NAPROSYN) 500 MG tablet Take 1 tablet (500 mg total) by mouth 2 (two) times daily as needed. 11/02/16  Yes Bartholomew Crews, MD  pantoprazole (PROTONIX) 40 MG tablet TAKE ONE (1) TABLET BY MOUTH EVERY DAY 09/20/17  Yes Bartholomew Crews, MD  warfarin (COUMADIN) 4 MG tablet Take one tablet on Mon / Wed / Fri. Take one-half tablet all other days. Dose will change frequently. 11/02/16  Yes Bartholomew Crews, MD  warfarin (COUMADIN) 4 MG tablet TAKE 1 TABLET BY MOUTH ON MONDAYS AND FRIDAYS.  TAKE 1/2 A TABLET BY MOUTH ALL OTHER DAYS. 08/05/17  Yes Bartholomew Crews, MD   Past Medical History:  Diagnosis Date  . CHOLELITHIASIS, WITH OBSTRUCTION 04/21/2006   s/p ERCP,sprincterotomy, stent ([facility])  . 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 GLAUCOMA 04/24/2008  . HYPERLIPIDEMIA 06/03/2006  . Hypothyroidism lifelong  . OBESITY, MILD 04/24/2008  . Tenosynovitis 01/2004   [place]   Social History   Socioeconomic History  . Marital status: Widowed    Spouse name: Not on file  . Number of children: Not on file  . Years of education: Not on file  . Highest education level: Not on file  Occupational History  . 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 Financial resource strain: Not on file  . Food insecurity:    Worry: Not on file    Inability: Not on file  . Transportation needs:    Medical: Not on file    Non-medical: Not on file  Tobacco Use  . Smoking status: Former Smoker    Packs/day: 1.00    Years: 20.00    Pack years: 20.00    Last attempt to quit: 07/06/1985    Years since quitting: 32.4  . Tobacco comment: Quit 1988  Substance and Sexual Activity  . Alcohol use: Yes    Comment: 2-3 glasses of wine daily.  .  Drug use: No  . Sexual activity: Not on file  Lifestyle  . Physical activity:    Days per week: Not on file    Minutes per session: Not on file  . Stress: Not on file  Relationships  . Social connections:    Talks on phone: Not on file    Gets together: Not on file    Attends religious service: Not on file    Active member of club or organization: Not on file    Attends meetings of clubs or organizations: Not on file    Relationship status: Not on file  Other Topics Concern  . Not on file  Social History Narrative   Married, no regular exercise.   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    Family History  Problem Relation Age of Onset  . Cancer Mother        H&N, smoker  . Cancer Sister        lung, 2011  . Other Other        grandmother had mastectomy in her 73's unknown reason  . Breast cancer Neg Hx     ASSESSMENT Recent Results: The most recent result is correlated with 18 mg per week: Lab Results  Component Value Date   INR 2.2 12/06/2017   INR 2.10 11/08/2017   INR 2.20 10/11/2017    Anticoagulation Dosing: Description   Take 1 tablet of your 4mg  blue colored warfarin tablets by mouth once-daily on Mondays and Fridays; all other days, take only 1/2 tablet.      INR today: Therapeutic  PLAN Weekly dose was unchanged.  Patient Instructions  Patient instructed to take medications as defined in the Anti-coagulation Track section of this encounter.  Patient instructed to take today's dose.  Patient instructed to take 1 tablet of your 4mg  blue colored warfarin tablets by mouth once-daily on Mondays and Fridays; all other days, take only 1/2 tablet.  Patient verbalized understanding of these instructions.     Patient advised to contact clinic or seek medical attention if signs/symptoms of bleeding or thromboembolism occur.  Patient verbalized understanding by repeating back  information and was advised to contact me if further medication-related questions arise. Patient was also provided an information handout.  Follow-up Return in 1 month (on 01/03/2018) for Follow up INR at 1000h.  Pennie Banter, PharmD, CACP, CPP  15 minutes spent face-to-face with the patient during the encounter. 50% of time spent on education. 50% of time was spent on fingerstick point of care INR sample collection, processing, results determination, and documentation in [email or web address].

## 2018-01-03 ENCOUNTER — Ambulatory Visit (INDEPENDENT_AMBULATORY_CARE_PROVIDER_SITE_OTHER): Payer: Medicare Other | Admitting: Pharmacist

## 2018-01-03 DIAGNOSIS — D689 Coagulation defect, unspecified: Secondary | ICD-10-CM

## 2018-01-03 DIAGNOSIS — Z5181 Encounter for therapeutic drug level monitoring: Secondary | ICD-10-CM

## 2018-01-03 DIAGNOSIS — Z86718 Personal history of other venous thrombosis and embolism: Secondary | ICD-10-CM | POA: Diagnosis not present

## 2018-01-03 DIAGNOSIS — Z7901 Long term (current) use of anticoagulants: Secondary | ICD-10-CM | POA: Diagnosis not present

## 2018-01-03 LAB — POCT INR: INR: 1.7 — AB (ref 2.0–3.0)

## 2018-01-03 NOTE — Patient Instructions (Addendum)
Patient instructed to take medications as defined in the Anti-coagulation Track section of this encounter.  Patient instructed to take today's dose.  Patient instructed to take 1 tablet of your 4mg  blue colored warfarin tablets by mouth once-daily on Mondays, Wednesdays  and Fridays.  All other days, take only 1/2 tablet.  Patient verbalized understanding of these instructions.   Pamela [name]

## 2018-01-03 NOTE — Progress Notes (Signed)
Anticoagulation Management Pamela [name] is a 72 y.o. female who reports to the clinic for monitoring of warfarin treatment.    Indication: DVT, history of; clotting disorder; long term current use of anticoagulant.  Duration: indefinite Supervising physician: Joni Reining  Anticoagulation Clinic Visit History: Patient does not report signs/symptoms of bleeding or thromboembolism  Other recent changes: No diet, medications, lifestyle endorsed by the patient at this visit.  Anticoagulation Episode Summary    Current INR goal:   2.0-3.0  TTR:   71.8 % (7.3 y)  Next INR check:   02/21/2018  INR from last check:   1.7! (01/03/2018)  Weekly max warfarin dose:     Target end date:   Indefinite  INR check location:   Anticoagulation Clinic  Preferred lab:     Send INR reminders to:   [facility]   Indications   Clotting disorder ([place]) [D68.9] DVT HX OF (Resolved) [B01.751]       Comments:         Anticoagulation Care Providers    Provider Role Specialty Phone number   Burman Freestone, MD  Internal Medicine [phone]      Allergies  Allergen Reactions  . Ace Inhibitors Cough   Prior to Admission medications   Medication Sig Start Date End Date Taking? Authorizing Provider  colestipol (COLESTID) 1 g tablet TAKE 1 TABLET BY MOUTH QD 02/11/17  Yes Bartholomew Crews, MD  colestipol (COLESTID) 1 g tablet TAKE ONE (1) TABLET BY MOUTH TWO (2) TIMES DAILY 11/22/17  Yes Bartholomew Crews, MD  fluticasone (FLONASE) 50 MCG/ACT nasal spray Place 2 sprays into both nostrils daily. 07/19/17  Yes Yu, Amy V, PA-C  indomethacin (INDOCIN) 25 MG capsule Prescribed by podiatry for use PRN 11/06/16  Yes Bartholomew Crews, MD  levothyroxine (SYNTHROID, LEVOTHROID) 88 MCG tablet TAKE ONE (1) TABLET BY MOUTH EVERY DAY 12/10/16  Yes Bartholomew Crews, MD  losartan (COZAAR) 25 MG tablet TAKE ONE (1) TABLET BY MOUTH EVERY DAY 12/01/17  Yes Bartholomew Crews, MD  Multiple Vitamins-Calcium  (DAILY COMBO MULTIVITS/CALCIUM) TABS Take 1 tablet by mouth daily.     Yes [provider]  naproxen (NAPROSYN) 500 MG tablet Take 1 tablet (500 mg total) by mouth 2 (two) times daily as needed. 11/02/16  Yes Bartholomew Crews, MD  pantoprazole (PROTONIX) 40 MG tablet TAKE ONE (1) TABLET BY MOUTH EVERY DAY 09/20/17  Yes Bartholomew Crews, MD  warfarin (COUMADIN) 4 MG tablet Take one tablet on Mon / Wed / Fri. Take one-half tablet all other days. Dose will change frequently. 11/02/16  Yes Bartholomew Crews, MD  warfarin (COUMADIN) 4 MG tablet TAKE 1 TABLET BY MOUTH ON MONDAYS AND FRIDAYS.  TAKE 1/2 A TABLET BY MOUTH ALL OTHER DAYS. 08/05/17  Yes Bartholomew Crews, MD   Past Medical History:  Diagnosis Date  . CHOLELITHIASIS, WITH OBSTRUCTION 04/21/2006   s/p ERCP,sprincterotomy, stent ([facility])  . 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 GLAUCOMA 04/24/2008  . HYPERLIPIDEMIA 06/03/2006  . Hypothyroidism lifelong  . OBESITY, MILD 04/24/2008  . Tenosynovitis 01/2004   [place]   Social History   Socioeconomic History  . Marital status: Widowed    Spouse name: Not on file  . Number of children: Not on file  . Years of education: Not on file  . Highest education level: Not on file  Occupational History  . Occupation: Engineer, maintenance  Employer: [employer]    Comment: Retired in 2011  Social Needs  . Financial resource strain: Not on file  . Food insecurity:    Worry: Not on file    Inability: Not on file  . Transportation needs:    Medical: Not on file    Non-medical: Not on file  Tobacco Use  . Smoking status: Former Smoker    Packs/day: 1.00    Years: 20.00    Pack years: 20.00    Last attempt to quit: 07/06/1985    Years since quitting: 32.5  . Tobacco comment: Quit 1988  Substance and Sexual Activity  . Alcohol use: Yes    Comment: 2-3 glasses of wine daily.  . Drug use: No  . Sexual activity: Not on  file  Lifestyle  . Physical activity:    Days per week: Not on file    Minutes per session: Not on file  . Stress: Not on file  Relationships  . Social connections:    Talks on phone: Not on file    Gets together: Not on file    Attends religious service: Not on file    Active member of club or organization: Not on file    Attends meetings of clubs or organizations: Not on file    Relationship status: Not on file  Other Topics Concern  . Not on file  Social History Narrative   Married, no regular exercise.   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    Family History  Problem Relation Age of Onset  . Cancer Mother        H&N, smoker  . Cancer Sister        lung, 2011  . Other Other        grandmother had mastectomy in her 21's unknown reason  . Breast cancer Neg Hx     ASSESSMENT Recent Results: The most recent result is correlated with 18 mg per week: Lab Results  Component Value Date   INR 1.7 (A) 01/03/2018   INR 2.2 12/06/2017   INR 2.10 11/08/2017    Anticoagulation Dosing: Description   Take 1 tablet of your 4mg  blue colored warfarin tablets by mouth once-daily on Mondays, Wednesdays  and Fridays.  All other days, take only 1/2 tablet.      INR today: Subtherapeutic  PLAN Weekly dose was increased by 11% to 20 mg per week  Patient Instructions  Patient instructed to take medications as defined in the Anti-coagulation Track section of this encounter.  Patient instructed to take today's dose.  Patient instructed to take 1 tablet of your 4mg  blue colored warfarin tablets by mouth once-daily on Mondays, Wednesdays  and Fridays.  All other days, take only 1/2 tablet.  Patient verbalized understanding of these instructions.     Patient advised to contact clinic or seek medical attention if signs/symptoms of bleeding or thromboembolism occur.  Patient verbalized understanding  by repeating back information and was advised to contact me if further medication-related questions arise. Patient was also provided an information handout.  Follow-up Return in 7 weeks (on 02/21/2018) for Follow up INR at 1015h.  Pennie Banter, PharmD, CACP, CPP  15 minutes spent face-to-face with the patient during the encounter. 50% of time spent on education. 50% of time was spent on fingerstick point of care INR sample collection, processing, results determination, dose  adjustment and documentation in [email or web address].

## 2018-01-04 NOTE — Progress Notes (Signed)
INTERNAL MEDICINE TEACHING ATTENDING ADDENDUM - Lucious Groves, DO Duration- indefinate, Indication- VTE, INR-  Lab Results  Component Value Date   INR 1.7 (A) 01/03/2018  . Agree with pharmacy recommendations as outlined in their note.

## 2018-02-03 ENCOUNTER — Other Ambulatory Visit: Payer: Self-pay | Admitting: Internal Medicine

## 2018-02-03 NOTE — Telephone Encounter (Signed)
Not sure why this was sent to attending pool, I will defer to Dr. Lynnae January.

## 2018-02-03 NOTE — Telephone Encounter (Signed)
Next appt scheduled 8/22 with PCP. 

## 2018-02-14 ENCOUNTER — Ambulatory Visit (INDEPENDENT_AMBULATORY_CARE_PROVIDER_SITE_OTHER): Payer: Medicare Other | Admitting: Pharmacist

## 2018-02-14 DIAGNOSIS — Z86718 Personal history of other venous thrombosis and embolism: Secondary | ICD-10-CM

## 2018-02-14 DIAGNOSIS — Z7901 Long term (current) use of anticoagulants: Secondary | ICD-10-CM | POA: Diagnosis not present

## 2018-02-14 DIAGNOSIS — D689 Coagulation defect, unspecified: Secondary | ICD-10-CM | POA: Diagnosis not present

## 2018-02-14 DIAGNOSIS — Z5181 Encounter for therapeutic drug level monitoring: Secondary | ICD-10-CM | POA: Diagnosis not present

## 2018-02-14 LAB — POCT INR: INR: 2.2 (ref 2.0–3.0)

## 2018-02-14 NOTE — Patient Instructions (Signed)
Patient instructed to take medications as defined in the Anti-coagulation Track section of this encounter.  Patient instructed to take today's dose. (Already taken for the day). Commence taking on Tuesday 13-AUG-19. Patient instructed to take 1 tablet of your 4mg  blue colored warfarin tablets by mouth once-daily on Mondays, Wednesdays  and Fridays.  All other days, take only 1/2 tablet.  Patient verbalized understanding of these instructions.

## 2018-02-14 NOTE — Progress Notes (Signed)
Anticoagulation Management Pamela [name] is a 72 y.o. female who reports to the clinic for monitoring of warfarin treatment.    Indication: Clotting disorder; DVT, HX of (resolved); Long term current use of anticoagulant.    Duration: indefinite Supervising physician: Lalla Brothers  Anticoagulation Clinic Visit History: Patient does not report signs/symptoms of bleeding or thromboembolism  Other recent changes: No diet, medications, lifestyle changes endorsed.  Anticoagulation Episode Summary    Current INR goal:   2.0-3.0  TTR:   71.3 % (7.5 y)  Next INR check:   02/21/2018  INR from last check:   2.2 (02/14/2018)  Weekly max warfarin dose:     Target end date:   Indefinite  INR check location:   Anticoagulation Clinic  Preferred lab:     Send INR reminders to:   [facility]   Indications   Clotting disorder ([place]) [D68.9] DVT HX OF (Resolved) [W09.811]       Comments:         Anticoagulation Care Providers    Provider Role Specialty Phone number   Burman Freestone, MD  Internal Medicine [phone]      Allergies  Allergen Reactions  . Ace Inhibitors Cough   Prior to Admission medications   Medication Sig Start Date End Date Taking? Authorizing Provider  colestipol (COLESTID) 1 g tablet TAKE 1 TABLET BY MOUTH QD 02/11/17  Yes Bartholomew Crews, MD  colestipol (COLESTID) 1 g tablet TAKE ONE (1) TABLET BY MOUTH TWO (2) TIMES DAILY 11/22/17  Yes Bartholomew Crews, MD  fluticasone (FLONASE) 50 MCG/ACT nasal spray Place 2 sprays into both nostrils daily. 07/19/17  Yes Yu, Amy V, PA-C  indomethacin (INDOCIN) 25 MG capsule Prescribed by podiatry for use PRN 11/06/16  Yes Bartholomew Crews, MD  levothyroxine (SYNTHROID, LEVOTHROID) 88 MCG tablet TAKE ONE (1) TABLET BY MOUTH EVERY DAY 02/03/18  Yes Bartholomew Crews, MD  losartan (COZAAR) 25 MG tablet TAKE ONE (1) TABLET BY MOUTH EVERY DAY 12/01/17  Yes Bartholomew Crews, MD  Multiple Vitamins-Calcium (DAILY COMBO  MULTIVITS/CALCIUM) TABS Take 1 tablet by mouth daily.     Yes [provider]  naproxen (NAPROSYN) 500 MG tablet Take 1 tablet (500 mg total) by mouth 2 (two) times daily as needed. 11/02/16  Yes Bartholomew Crews, MD  pantoprazole (PROTONIX) 40 MG tablet TAKE ONE (1) TABLET BY MOUTH EVERY DAY 09/20/17  Yes Bartholomew Crews, MD  warfarin (COUMADIN) 4 MG tablet Take one tablet on Mon / Wed / Fri. Take one-half tablet all other days. Dose will change frequently. 11/02/16  Yes Bartholomew Crews, MD  warfarin (COUMADIN) 4 MG tablet TAKE 1 TABLET BY MOUTH ON MONDAYS AND FRIDAYS.  TAKE 1/2 A TABLET BY MOUTH ALL OTHER DAYS. 08/05/17  Yes Bartholomew Crews, MD   Past Medical History:  Diagnosis Date  . CHOLELITHIASIS, WITH OBSTRUCTION 04/21/2006   s/p ERCP,sprincterotomy, stent ([place])  . 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 GLAUCOMA 04/24/2008  . HYPERLIPIDEMIA 06/03/2006  . Hypothyroidism lifelong  . OBESITY, MILD 04/24/2008  . Tenosynovitis 01/2004   [place]   Social History   Socioeconomic History  . Marital status: Widowed    Spouse name: Not on file  . Number of children: Not on file  . Years of education: Not on file  . Highest education level: Not on file  Occupational History  . Occupation: Engineer, structural:   [employer]    Comment: Retired in 2011  Social Needs  . Financial resource strain: Not on file  . Food insecurity:    Worry: Not on file    Inability: Not on file  . Transportation needs:    Medical: Not on file    Non-medical: Not on file  Tobacco Use  . Smoking status: Former Smoker    Packs/day: 1.00    Years: 20.00    Pack years: 20.00    Last attempt to quit: 07/06/1985    Years since quitting: 32.6  . Tobacco comment: Quit 1988  Substance and Sexual Activity  . Alcohol use: Yes    Comment: 2-3 glasses of wine daily.  . Drug use: No  . Sexual activity: Not on file  Lifestyle   . Physical activity:    Days per week: Not on file    Minutes per session: Not on file  . Stress: Not on file  Relationships  . Social connections:    Talks on phone: Not on file    Gets together: Not on file    Attends religious service: Not on file    Active member of club or organization: Not on file    Attends meetings of clubs or organizations: Not on file    Relationship status: Not on file  Other Topics Concern  . Not on file  Social History Narrative   Married, no regular exercise.   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    Family History  Problem Relation Age of Onset  . Cancer Mother        H&N, smoker  . Cancer Sister        lung, 2011  . Other Other        grandmother had mastectomy in her 2's unknown reason  . Breast cancer Neg Hx     ASSESSMENT Recent Results: The most recent result is correlated with 20 mg per week: Lab Results  Component Value Date   INR 2.2 02/14/2018   INR 1.7 (A) 01/03/2018   INR 2.2 12/06/2017    Anticoagulation Dosing: Description   Take 1 tablet of your 4mg  blue colored warfarin tablets by mouth once-daily on Mondays, Wednesdays  and Fridays.  All other days, take only 1/2 tablet.      INR today: Therapeutic  PLAN Weekly dose was unchanged.  Patient Instructions  Patient instructed to take medications as defined in the Anti-coagulation Track section of this encounter.  Patient instructed to take today's dose. (Already taken for the day). Commence taking on Tuesday 13-AUG-19. Patient instructed to take 1 tablet of your 4mg  blue colored warfarin tablets by mouth once-daily on Mondays, Wednesdays  and Fridays.  All other days, take only 1/2 tablet.  Patient verbalized understanding of these instructions.     Patient advised to contact clinic or seek medical attention if signs/symptoms of bleeding or thromboembolism occur.  Patient  verbalized understanding by repeating back information and was advised to contact me if further medication-related questions arise. Patient was also provided an information handout.  Follow-up Return in 4 weeks (on 03/14/2018) for Follow up INR at 1000h.  Pennie Banter, PharmD, CPP  15 minutes spent face-to-face with the patient during the encounter. 50% of time spent on education. 50% of time was spent on fingerstick point of care INR sample collection, processing, results determination and  documentation in [email or web address].

## 2018-02-15 NOTE — Progress Notes (Signed)
INTERNAL MEDICINE TEACHING ATTENDING ADDENDUM ° °I agree with pharmacy recommendations as outlined in their note.  ° °-Bacilio Abascal MD ° °

## 2018-02-21 ENCOUNTER — Ambulatory Visit: Payer: Medicare Other

## 2018-02-24 ENCOUNTER — Encounter: Payer: Self-pay | Admitting: Internal Medicine

## 2018-02-24 ENCOUNTER — Ambulatory Visit (INDEPENDENT_AMBULATORY_CARE_PROVIDER_SITE_OTHER): Payer: Medicare Other | Admitting: Internal Medicine

## 2018-02-24 VITALS — BP 157/64 | HR 95 | Wt 197.0 lb

## 2018-02-24 DIAGNOSIS — E039 Hypothyroidism, unspecified: Secondary | ICD-10-CM | POA: Diagnosis not present

## 2018-02-24 DIAGNOSIS — Z23 Encounter for immunization: Secondary | ICD-10-CM | POA: Diagnosis not present

## 2018-02-24 DIAGNOSIS — K909 Intestinal malabsorption, unspecified: Secondary | ICD-10-CM

## 2018-02-24 DIAGNOSIS — I10 Essential (primary) hypertension: Secondary | ICD-10-CM

## 2018-02-24 DIAGNOSIS — R6889 Other general symptoms and signs: Secondary | ICD-10-CM

## 2018-02-24 DIAGNOSIS — Z0000 Encounter for general adult medical examination without abnormal findings: Secondary | ICD-10-CM | POA: Diagnosis not present

## 2018-02-24 DIAGNOSIS — E669 Obesity, unspecified: Secondary | ICD-10-CM

## 2018-02-24 DIAGNOSIS — M8580 Other specified disorders of bone density and structure, unspecified site: Secondary | ICD-10-CM

## 2018-02-24 DIAGNOSIS — K9089 Other intestinal malabsorption: Secondary | ICD-10-CM

## 2018-02-24 DIAGNOSIS — D689 Coagulation defect, unspecified: Secondary | ICD-10-CM

## 2018-02-24 DIAGNOSIS — K219 Gastro-esophageal reflux disease without esophagitis: Secondary | ICD-10-CM

## 2018-02-24 MED ORDER — COLESTIPOL HCL 1 G PO TABS: ORAL_TABLET | ORAL | 11 refills | Status: DC

## 2018-02-24 NOTE — Assessment & Plan Note (Signed)
This problem is chronic and stable.  She denies any signs or symptoms of hypo-or hyperthyroidism.  She is on Synthroid 88 mcg/day without side effects.  I am checking a TSH today as it has been about a year since her last check.  PLAN:  Cont current meds TSH

## 2018-02-24 NOTE — Assessment & Plan Note (Signed)
This problem is chronic and stable.  She continues to see our warfarin clinic.  She has extremely good compliance with her medication and her INR checks.  PLAN:  Cont current meds Follow warfarin clinic notes

## 2018-02-24 NOTE — Assessment & Plan Note (Signed)
This problem is chronic and worsened.  She had her gallbladder out around 2004.  About 4 years later, around 2008, she started having symptoms.  The symptoms are loose stool with urgency and occasional incontinence if she cannot get to the toilet soon enough.  She occasionally, but rarely, has a formed stool.  There is no pain associated with the symptoms.  She also has not noticed any association with certain foods or the absence of food.  She saw Dr. 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  I had documented previously that this provided relief but today, she states that she never really got any significant relief taking this medication.  She takes 1 pill and is prescribed to take it twice daily but since it cannot be taken with other medications, she frequently is unable to take the second dose and only takes 1 dose.  She has had a colonoscopy in 2015 but random biopsies were not taken.  There were no gross abnormalities.  She had a normal TSH about 1 year ago.  My differential would include bile acid malabsorption which is underdosed with the Colestid, celiac disease, microscopic colitis, small bowel bacterial overgrowth, or less likely, of food intolerance like lactose or fructose.  We discussed treatment options and she wanted to start with increasing the dose of Colestid and using Imodium as needed.  She was agreeable to checking for B12 deficiency and ferritin deficiency due to likely malabsorption but wanted to hold off on any further work-up at this time including checking for celiac disease or having a colonoscopy with random biopsies or an empiric course of antibiotics.  We discussed that she is on the lowest dose of Colestid and that she can titrate that up to see if she gets relief.  Since it is difficult for her to take it twice daily, we discussed increasing the dose by 1 g a day over a couple of weeks until about 6 g a  day.  If she finds that this is providing relief at a higher dose but not lasting long enough, we then discussed that it would be necessary that she go to twice daily dosing.  My reading on the dosing of Colestid indicates that most relief will be at a dose of up to 5 mg which is why I gave her a ceiling of 6 g before calling me.  I also recommended 1 mg of Imodium to be taken when she is going to be out of the house without easy access to a toilet and in a situation in which fecal incontinence would be devastating.  She knows not to take the Imodium daily as that would prevent [place] from knowing however the increased dose of Colestid is working.  If this approach fails, we will then discuss further work-up for other diagnoses on the differential.  PLAN : Increase Colestid up to 6 g/day with possible twice daily dosing Imodium 1 mg as needed

## 2018-02-24 NOTE — Patient Instructions (Signed)
Annual Wellness Visit   Medicare Covered Preventative Screenings and Services  Services & Screenings Men and Women Who How Often Need? Date of Last Service Action  Abdominal Aortic Aneurysm Adults with AAA risk factors Once     Alcohol Misuse and Counseling All Adults Screening once a year if no alcohol misuse. Counseling up to 4 face to face sessions.  Today   Bone Density Measurement  Adults at risk for osteoporosis Once every 2 yrs     Lipid Panel Z13.6 All adults without CV disease Once every 5 yrs     Colorectal Cancer   Stool sample or  Colonoscopy All adults 7 and older   Once every year  Every 10 years     Depression All Adults Once a year  Today   Diabetes Screening Blood glucose, post glucose load, or GTT Z13.1  All adults at risk  Pre-diabetics  Once per year  Twice per year     Diabetes  Self-Management Training All adults Diabetics 10 hrs first year; 2 hours subsequent years. Requires Copay     Glaucoma  Diabetics  Family history of glaucoma  African Americans 31 yrs +  Hispanic Americans 63 yrs + Annually - requires coppay     Hepatitis C Z72.89 or F19.20  High Risk for HCV  Born between 1945 and 1965  Annually  Once     HIV Z11.4 All adults based on risk  Annually btw ages 58 & 67 regardless of risk  Annually > 65 yrs if at increased risk     Lung Cancer Screening Asymptomatic adults aged 20-77 with 30 pack yr history and current smoker OR quit within the last 15 yrs Annually Must have counseling and shared decision making documentation before first screen     Medical Nutrition Therapy Adults with   Diabetes  Renal disease  Kidney transplant within past 3 yrs 3 hours first year; 2 hours subsequent years     Obesity and Counseling All adults Screening once a year Counseling if BMI 30 or higher  Today   Tobacco Use Counseling Adults who use tobacco  Up to 8 visits in one year     Vaccines Z23  Hepatitis B  Influenza   Pneumonia   Adults   Once  Once every flu season  Two different vaccines separated by one year  Today Flu vaccine today Pneumonia 23 vaccine in a month or two Shingles sometime next 6 months  Next Annual Wellness Visit People with Medicare Every year  Today     Services & Screenings Women Who How Often Need  Date of Last Service Action  Mammogram  Z12.31 Women over 58 One baseline ages 90-39. Annually ager 40 yrs+     Pap tests All women Annually if high risk. Every 2 yrs for normal risk women     Screening for cervical cancer with   Pap (Z01.419 nl or Z01.411abnl) &  HPV Z11.51 Women aged 5 to 70 Once every 5 yrs     Screening pelvic and breast exams All women Annually if high risk. Every 2 yrs for normal risk women     Sexually Transmitted Diseases  Chlamydia  Gonorrhea  Syphilis All at risk adults Annually for non pregnant females at increased risk         [place] Men Who How Ofter Need  Date of Last Service Action  Prostate Cancer - DRE & PSA Men over 50 Annually.  DRE might require a  copay.     Sexually Transmitted Diseases  Syphilis All at risk adults Annually for men at increased risk         Things That May Be Affecting Your Health:  Alcohol  Hearing loss  Pain    Depression  Home Safety  Sexual Health   Diabetes  Lack of physical activity  Stress   Difficulty with daily activities  Loneliness  Tiredness   Drug use  Medicines  Tobacco use   Falls  Motor Vehicle Safety x Weight   Food choices  Oral Health  Other    YOUR PERSONALIZED HEALTH PLAN : 1. Schedule your next subsequent Medicare Wellness visit in one year 2. Attend all of your regular appointments to address your medical issues 3. Complete the preventative screenings and services 4. Get flu shot today 5. Get pneumonia 23 shot in a month or two 6. Get shingrex (2 shots) in next 6-12 months 7. Increase colestid by one pill to see ig helps 8. Imodium pills with line to break in half and  try 1 mg (one half pill) as needed

## 2018-02-24 NOTE — Assessment & Plan Note (Signed)
We completed her Medicare wellness appointment today.  She drinks 2 to 3 glasses of wine a day and thinks it might be a bigger serving than 5 ounces.  She smoked up to 1 pack/day starting when she was in her 79s and quit in 1988.  This would be about a 23-pack-year history maximum.  She is getting a flu shot today.  She will get a pneumonia 23 at her next appointment with Dr. Elie Confer.  I will print off a Shingrix prescription so she can take this to a pharmacy to get vaccinated.  She previously was vaccinated with Zostavax.

## 2018-02-24 NOTE — Progress Notes (Addendum)
Subjective:   Pamela [name] is a 72 y.o. female who presents for a Medicare Annual Wellness Visit.  The following items have been reviewed and updated today in the appropriate area in the EMR.   Health Risk Assessment  Height, weight, BMI, and BP Visual acuity if needed Depression screen Fall risk / safety level Advance directive discussion Medical and family history were reviewed and updated Updating list of other providers & suppliers Medication reconciliation, including over the counter medicines Cognitive screen Written screening schedule Risk Factor list Personalized health advice, risky behaviors, and treatment advice   Current Social History 02/24/2018    Patient lives with spouse in a/an home / condo / townhome which is unknown story/stories. There unknown steps up to the entrance the patient uses.   Patient's method of transportation is personal car.  The highest level of education was college diploma.  The patient currently retired.  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 No distress.  HENT:  Head: Normocephalic and atraumatic.  Right Ear: External ear normal.  Left Ear: External ear normal.  Nose: Nose normal.  Eyes: Conjunctivae and EOM are normal. Right eye exhibits no discharge. Left eye exhibits no discharge. No scleral icterus.  Cardiovascular: Normal rate, regular rhythm and normal heart sounds.  Pulmonary/Chest: Effort normal and breath sounds normal. She has no wheezes. She has no rales.  Musculoskeletal: She exhibits edema. She  exhibits no deformity.  RLE > LLE  Neurological: She is alert.  Skin: Skin is warm and dry. No rash noted. She is not diaphoretic. No erythema. No pallor.  Psychiatric: She has a normal mood and affect. Her behavior is normal. Judgment and thought content normal.     Activities of Daily Living In your present state of health, do you have any difficulty performing the following activities: 10/05/2017  Hearing? N  Vision? N  Difficulty concentrating or making decisions? N  Walking or climbing stairs? Y  Comment STAIRS / DUE TO KNEE  Dressing or bathing? N  Doing errands, shopping? N  Some recent data might be hidden    Goals Goals    . Blood Pressure < 140/90    . Exercise 3x per week (30 min per time)       Fall Risk Fall Risk  10/05/2017 02/11/2017 12/12/2015 03/04/2015 12/13/2014  Falls in the past year? No No No Yes No  Number falls in past yr: - - - 1 -  Injury with Fall? - - - No -  Risk for fall due to : - - - Medication side effect -    Depression Screen PHQ 2/9 Scores 02/24/2018 10/05/2017 02/11/2017 12/12/2015  PHQ - 2 Score 2 0 0 0  PHQ- 9 Score 7 - - -     Cognitive Testing I assessed the patient for cognitive issues and the patient did not have issues with his / her cognition.  Mini-Cog - 02/24/18 1212    Normal clock drawing test?  yes    How many words correct?  3  Assessment and Plan:    During the course of the visit the patient was educated and counseled about appropriate screening and preventive services as documented in the assessment and plan.  The printed AVS was given to the patient and included an updated screening schedule, a list of risk factors, and personalized health advice.      Larey Dresser, MD  02/24/2018

## 2018-02-24 NOTE — Assessment & Plan Note (Signed)
This problem is chronic and stable.  She uses her PPI daily and only very rarely has a nocturnal cough.  However, the cough resolved when she started her PPI.  She has no side effects to this medication.  PLAN:  Cont current meds

## 2018-02-24 NOTE — Assessment & Plan Note (Addendum)
This problem is chronic and stable.  She is on losartan 25 without side effects.  Her blood pressure has always been at goal but is slightly elevated today.  Her blood pressure goal would vary on which guidelines are used but I would like to get her less than 130/80 as her health is good enough that she likely has a fairly long life expectancy.  I will continue current dose but will reassess at her next appointment.  Check a bmet today.  PLAN:  Cont current meds   BP Readings from Last 3 Encounters:  02/24/18 (!) 157/64  10/05/17 138/66  07/19/17 121/75

## 2018-02-24 NOTE — Assessment & Plan Note (Signed)
This problem is chronic and stable.  We reviewed her DEXA scan's.  She takes a multivitamin but not a calcium or vitamin D pill.  She is weightbearing but without any dedicated weightbearing exercises.  PLAN : Follow

## 2018-02-24 NOTE — Assessment & Plan Note (Signed)
This problem is chronic and stable.  She tries to walk whenever she is able but does not accomplish this 3 times per week.  She is able to walk three quarters of a mile around her house without getting short of breath or having to stop.  PLAN : Follow  Wt Readings from Last 3 Encounters:  02/24/18 197 lb (89.4 kg)  10/05/17 190 lb 8 oz (86.4 kg)  12/12/15 192 lb 11.2 oz (87.4 kg)

## 2018-02-25 ENCOUNTER — Encounter: Payer: Self-pay | Admitting: Internal Medicine

## 2018-02-25 LAB — BMP8+ANION GAP
Anion Gap: 15 mmol/L (ref 10.0–18.0)
BUN / CREAT RATIO: 19 (ref 12–28)
BUN: 17 mg/dL (ref 8–27)
CALCIUM: 9.8 mg/dL (ref 8.7–10.3)
CO2: 25 mmol/L (ref 20–29)
Chloride: 100 mmol/L (ref 96–106)
Creatinine, Ser: 0.9 mg/dL (ref 0.57–1.00)
GFR calc Af Amer: 73 mL/min/{1.73_m2} (ref >59)
GFR calc non Af Amer: 63 mL/min/{1.73_m2} (ref >59)
Glucose: 88 mg/dL (ref 65–99)
Potassium: 4.7 mmol/L (ref 3.5–5.2)
Sodium: 140 mmol/L (ref 134–144)

## 2018-02-25 LAB — FERRITIN: Ferritin: 250 ng/mL — ABNORMAL HIGH (ref 15–150)

## 2018-02-25 LAB — VITAMIN B12

## 2018-02-25 LAB — TSH: TSH: 1.38 u[IU]/mL (ref 0.450–4.500)

## 2018-03-01 ENCOUNTER — Other Ambulatory Visit: Payer: Self-pay | Admitting: Internal Medicine

## 2018-03-01 ENCOUNTER — Encounter: Payer: Self-pay | Admitting: Internal Medicine

## 2018-03-01 DIAGNOSIS — E538 Deficiency of other specified B group vitamins: Secondary | ICD-10-CM

## 2018-03-01 MED ORDER — CYANOCOBALAMIN 1000 MCG/ML IJ SOLN
1000.0000 ug | INTRAMUSCULAR | Status: DC
  Administered 2018-03-14 – 2018-08-01 (×4): 1000 ug via INTRAMUSCULAR

## 2018-03-14 ENCOUNTER — Ambulatory Visit (INDEPENDENT_AMBULATORY_CARE_PROVIDER_SITE_OTHER): Payer: Medicare Other | Admitting: *Deleted

## 2018-03-14 ENCOUNTER — Ambulatory Visit (INDEPENDENT_AMBULATORY_CARE_PROVIDER_SITE_OTHER): Payer: Medicare Other | Admitting: Pharmacist

## 2018-03-14 DIAGNOSIS — Z7901 Long term (current) use of anticoagulants: Secondary | ICD-10-CM

## 2018-03-14 DIAGNOSIS — E538 Deficiency of other specified B group vitamins: Secondary | ICD-10-CM

## 2018-03-14 DIAGNOSIS — Z86718 Personal history of other venous thrombosis and embolism: Secondary | ICD-10-CM | POA: Diagnosis not present

## 2018-03-14 DIAGNOSIS — D689 Coagulation defect, unspecified: Secondary | ICD-10-CM

## 2018-03-14 DIAGNOSIS — Z5181 Encounter for therapeutic drug level monitoring: Secondary | ICD-10-CM | POA: Diagnosis not present

## 2018-03-14 LAB — POCT INR: INR: 3.2 — AB (ref 2.0–3.0)

## 2018-03-14 NOTE — Progress Notes (Signed)
Anticoagulation Management Pamela [name] is a 72 y.o. female who reports to the clinic for monitoring of warfarin treatment.    Indication: DVT, history of, Clotting disorder, long term current use of anticoagulant.   Duration: indefinite Supervising physician: Lenice Pressman, MD  Anticoagulation Clinic Visit History: Patient does not report signs/symptoms of bleeding or thromboembolism  Other recent changes: No diet, medications, lifestyle changes endorsed. Anticoagulation Episode Summary    Current INR goal:   2.0-3.0  TTR:   71.4 % (7.5 y)  Next INR check:   04/11/2018  INR from last check:   3.2! (03/14/2018)  Weekly max warfarin dose:     Target end date:   Indefinite  INR check location:   Anticoagulation Clinic  Preferred lab:     Send INR reminders to:   [facility]   Indications   Clotting disorder ([place]) [D68.9] DVT HX OF (Resolved) [O35.009]       Comments:         Anticoagulation Care Providers    Provider Role Specialty Phone number   Burman Freestone, MD  Internal Medicine [phone]      Allergies  Allergen Reactions  . Ace Inhibitors Cough   Prior to Admission medications   Medication Sig Start Date End Date Taking? Authorizing Provider  colestipol (COLESTID) 1 g tablet TAKE 6 TABLET BY MOUTH QD 02/24/18  Yes Bartholomew Crews, MD  fluticasone [facility]) 50 MCG/ACT nasal spray Place 2 sprays into both nostrils daily. 07/19/17  Yes Yu, Amy V, PA-C  glucosamine-chondroitin 500-400 MG tablet Take 1 tablet by mouth 3 (three) times daily.   Yes [provider]  levothyroxine (SYNTHROID, LEVOTHROID) 88 MCG tablet TAKE ONE (1) TABLET BY MOUTH EVERY DAY 02/03/18  Yes Bartholomew Crews, MD  losartan (COZAAR) 25 MG tablet TAKE ONE (1) TABLET BY MOUTH EVERY DAY 12/01/17  Yes Bartholomew Crews, MD  Multiple Vitamins-Calcium (DAILY COMBO MULTIVITS/CALCIUM) TABS Take 1 tablet by mouth daily.     Yes [provider]  pantoprazole  (PROTONIX) 40 MG tablet TAKE ONE (1) TABLET BY MOUTH EVERY DAY 09/20/17  Yes Bartholomew Crews, MD  warfarin (COUMADIN) 4 MG tablet Take one tablet on Mon / Wed / Fri. Take one-half tablet all other days. Dose will change frequently. 11/02/16  Yes Bartholomew Crews, MD  warfarin (COUMADIN) 4 MG tablet TAKE 1 TABLET BY MOUTH ON MONDAYS AND FRIDAYS.  TAKE 1/2 A TABLET BY MOUTH ALL OTHER DAYS. 08/05/17  Yes Bartholomew Crews, MD   Past Medical History:  Diagnosis Date  . CHOLELITHIASIS, WITH OBSTRUCTION 04/21/2006   s/p ERCP,sprincterotomy, stent ([facility])  . 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 GLAUCOMA 04/24/2008  . HYPERLIPIDEMIA 06/03/2006  . Hypothyroidism lifelong  . OBESITY, MILD 04/24/2008  . Tenosynovitis 01/2004   Sypher   Social History   Socioeconomic History  . Marital status: Widowed    Spouse name: Not on file  . Number of children: Not on file  . Years of education: Not on file  . Highest education level: Not on file  Occupational History  . 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 Financial resource strain: Not on file  . Food insecurity:    Worry: Not on file    Inability: Not on file  . Transportation needs:    Medical: Not on file  Non-medical: Not on file  Tobacco Use  . Smoking status: Former Smoker    Packs/day: 1.00    Years: 20.00    Pack years: 20.00    Last attempt to quit: 07/06/1985    Years since quitting: 32.7  . Tobacco comment: Quit 1988  Substance and Sexual Activity  . Alcohol use: Yes    Comment: 2-3 glasses of wine daily.  . Drug use: No  . Sexual activity: Not on file  Lifestyle  . Physical activity:    Days per week: Not on file    Minutes per session: Not on file  . Stress: Not on file  Relationships  . Social connections:    Talks on phone: Not on file    Gets together: Not on file    Attends religious  service: Not on file    Active member of club or organization: Not on file    Attends meetings of clubs or organizations: Not on file    Relationship status: Not on file  Other Topics Concern  . Not on file  Social History Narrative   Married, no regular exercise.   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    Family History  Problem Relation Age of Onset  . Cancer Mother        H&N, smoker  . Cancer Sister        lung, 2011, smoker  . Other Other        grandmother had mastectomy in her 69's unknown reason  . Breast cancer Neg Hx     ASSESSMENT Recent Results: The most recent result is correlated with 20 mg per week: Lab Results  Component Value Date   INR 3.2 (A) 03/14/2018   INR 2.2 02/14/2018   INR 1.7 (A) 01/03/2018    Anticoagulation Dosing: Description   Take 1 tablet of your 4mg  blue colored warfarin tablets by mouth once-daily on Wednesdays  and Fridays.  All other days, take only 1/2 tablet.      INR today: Supratherapeutic  PLAN Weekly dose was decreased by 10% to 18 mg per week  Patient Instructions  Patient instructed to take medications as defined in the Anti-coagulation Track section of this encounter.  Patient instructed to take today's dose.  Patient instructed to take 1 tablet of your 4mg  blue colored warfarin tablets by mouth once-daily on Wednesdays  and Fridays.  All other days, take only 1/2 tablet.  Patient verbalized understanding of these instructions.     Patient advised to contact clinic or seek medical attention if signs/symptoms of bleeding or thromboembolism occur.  Patient verbalized understanding by repeating back information and was advised to contact me if further medication-related questions arise. Patient was also provided an information handout.  Follow-up Return in 4 weeks (on 04/11/2018) for Follow up INR at 0945h.  Pennie Banter, PharmD,  CACP, CPP  15 minutes spent face-to-face with the patient during the encounter. 50% of time spent on education. 50% of time was spent on fingerstick point of care INR sample collection, processing, results determination, dose adjustment and documentation in [email or web address].kim.net/.

## 2018-03-14 NOTE — Progress Notes (Signed)
     Patient presents for first Vit B12 injection. Tolerated well. Patient aware that these injections will be given monthly. Hubbard Hartshorn, RN, BSN

## 2018-03-14 NOTE — Patient Instructions (Signed)
Patient instructed to take medications as defined in the Anti-coagulation Track section of this encounter.  Patient instructed to take today's dose.  Patient instructed to take 1 tablet of your 4mg  blue colored warfarin tablets by mouth once-daily on Wednesdays  and Fridays.  All other days, take only 1/2 tablet.  Patient verbalized understanding of these instructions.

## 2018-03-15 NOTE — Progress Notes (Signed)
INTERNAL MEDICINE TEACHING ATTENDING ADDENDUM  I agree with pharmacy recommendations as outlined in their note.   Alexander N Raines, MD  

## 2018-03-21 ENCOUNTER — Other Ambulatory Visit: Payer: Self-pay | Admitting: Obstetrics and Gynecology

## 2018-03-21 DIAGNOSIS — Z1231 Encounter for screening mammogram for malignant neoplasm of breast: Secondary | ICD-10-CM

## 2018-04-11 ENCOUNTER — Telehealth: Payer: Self-pay | Admitting: Internal Medicine

## 2018-04-11 ENCOUNTER — Ambulatory Visit (INDEPENDENT_AMBULATORY_CARE_PROVIDER_SITE_OTHER): Payer: Medicare Other | Admitting: *Deleted

## 2018-04-11 ENCOUNTER — Ambulatory Visit (INDEPENDENT_AMBULATORY_CARE_PROVIDER_SITE_OTHER): Payer: Medicare Other | Admitting: Pharmacist

## 2018-04-11 ENCOUNTER — Encounter: Payer: Self-pay | Admitting: Internal Medicine

## 2018-04-11 DIAGNOSIS — E538 Deficiency of other specified B group vitamins: Secondary | ICD-10-CM | POA: Diagnosis not present

## 2018-04-11 DIAGNOSIS — Z7901 Long term (current) use of anticoagulants: Secondary | ICD-10-CM | POA: Diagnosis not present

## 2018-04-11 DIAGNOSIS — Z5181 Encounter for therapeutic drug level monitoring: Secondary | ICD-10-CM

## 2018-04-11 DIAGNOSIS — D689 Coagulation defect, unspecified: Secondary | ICD-10-CM

## 2018-04-11 DIAGNOSIS — Z86718 Personal history of other venous thrombosis and embolism: Secondary | ICD-10-CM | POA: Diagnosis not present

## 2018-04-11 LAB — POCT INR: INR: 2 (ref 2.0–3.0)

## 2018-04-11 MED ORDER — CYANOCOBALAMIN 1000 MCG/ML IJ SOLN
1000.0000 ug | Freq: Once | INTRAMUSCULAR | Status: AC
  Administered 2018-04-11: 1000 ug via INTRAMUSCULAR

## 2018-04-11 NOTE — Progress Notes (Signed)
Anticoagulation Management Pamela [name] is a 72 y.o. female who reports to the clinic for monitoring of warfarin treatment.    Indication: Clotting disorder, History of DVT, (Resolved); Long-term (current) use of anticoagulant.  Duration: indefinite Supervising physician: Aldine Contes  Anticoagulation Clinic Visit History: Patient does not report signs/symptoms of bleeding or thromboembolism Other recent changes: No diet, medications, lifestyle changes endorsed.  Anticoagulation Episode Summary    Current INR goal:   2.0-3.0  TTR:   71.5 % (7.6 y)  Next INR check:   05/09/2018  INR from last check:   2.0 (04/11/2018)  Weekly max warfarin dose:     Target end date:   Indefinite  INR check location:   Anticoagulation Clinic  Preferred lab:     Send INR reminders to:   [facility]   Indications   Clotting disorder ([place]) [D68.9] DVT HX OF (Resolved) [D92.426]       Comments:         Anticoagulation Care Providers    Provider Role Specialty Phone number   Burman Freestone, MD  Internal Medicine [phone]      Allergies  Allergen Reactions  . Ace Inhibitors Cough   Prior to Admission medications   Medication Sig Start Date End Date Taking? Authorizing Provider  colestipol (COLESTID) 1 g tablet TAKE 6 TABLET BY MOUTH QD 02/24/18  Yes Bartholomew Crews, MD  fluticasone [facility]) 50 MCG/ACT nasal spray Place 2 sprays into both nostrils daily. 07/19/17  Yes Yu, Amy V, PA-C  glucosamine-chondroitin 500-400 MG tablet Take 1 tablet by mouth 3 (three) times daily.   Yes [provider]  levothyroxine (SYNTHROID, LEVOTHROID) 88 MCG tablet TAKE ONE (1) TABLET BY MOUTH EVERY DAY 02/03/18  Yes Bartholomew Crews, MD  losartan (COZAAR) 25 MG tablet TAKE ONE (1) TABLET BY MOUTH EVERY DAY 12/01/17  Yes Bartholomew Crews, MD  Multiple Vitamins-Calcium (DAILY COMBO MULTIVITS/CALCIUM) TABS Take 1 tablet by mouth daily.     Yes [provider]  pantoprazole  (PROTONIX) 40 MG tablet TAKE ONE (1) TABLET BY MOUTH EVERY DAY 09/20/17  Yes Bartholomew Crews, MD  warfarin (COUMADIN) 4 MG tablet Take one tablet on Mon / Wed / Fri. Take one-half tablet all other days. Dose will change frequently. 11/02/16  Yes Bartholomew Crews, MD  warfarin (COUMADIN) 4 MG tablet TAKE 1 TABLET BY MOUTH ON MONDAYS AND FRIDAYS.  TAKE 1/2 A TABLET BY MOUTH ALL OTHER DAYS. 08/05/17  Yes Bartholomew Crews, MD   Past Medical History:  Diagnosis Date  . CHOLELITHIASIS, WITH OBSTRUCTION 04/21/2006   s/p ERCP,sprincterotomy, stent ([facility])  . 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 GLAUCOMA 04/24/2008  . HYPERLIPIDEMIA 06/03/2006  . Hypothyroidism lifelong  . OBESITY, MILD 04/24/2008  . Tenosynovitis 01/2004   Sypher   Social History   Socioeconomic History  . Marital status: Widowed    Spouse name: Not on file  . Number of children: Not on file  . Years of education: Not on file  . Highest education level: Not on file  Occupational History  . 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 Financial resource strain: Not on file  . Food insecurity:    Worry: Not on file    Inability: Not on file  . Transportation needs:    Medical: Not on file  Non-medical: Not on file  Tobacco Use  . Smoking status: Former Smoker    Packs/day: 1.00    Years: 20.00    Pack years: 20.00    Last attempt to quit: 07/06/1985    Years since quitting: 32.7  . Tobacco comment: Quit 1988  Substance and Sexual Activity  . Alcohol use: Yes    Comment: 2-3 glasses of wine daily.  . Drug use: No  . Sexual activity: Not on file  Lifestyle  . Physical activity:    Days per week: Not on file    Minutes per session: Not on file  . Stress: Not on file  Relationships  . Social connections:    Talks on phone: Not on file    Gets together: Not on file    Attends religious  service: Not on file    Active member of club or organization: Not on file    Attends meetings of clubs or organizations: Not on file    Relationship status: Not on file  Other Topics Concern  . Not on file  Social History Narrative   Married, no regular exercise.   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    Family History  Problem Relation Age of Onset  . Cancer Mother        H&N, smoker  . Cancer Sister        lung, 2011, smoker  . Other Other        grandmother had mastectomy in her 86's unknown reason  . Breast cancer Neg Hx     ASSESSMENT Recent Results: The most recent result is correlated with 18 mg per week: Lab Results  Component Value Date   INR 2.0 04/11/2018   INR 3.2 (A) 03/14/2018   INR 2.2 02/14/2018    Anticoagulation Dosing: Description   Take 1 tablet of your 4mg  blue colored warfarin tablets by mouth once-daily on Mondays, Wednesdays  and Fridays.  All other days, take only 1/2 tablet.      INR today: Therapeutic  PLAN Weekly dose was increased by 11% to 22 mg per week  Patient Instructions  Patient instructed to take medications as defined in the Anti-coagulation Track section of this encounter.  Patient instructed to take today's dose.  Patient instructed to take 1 tablet of your 4mg  blue colored warfarin tablets by mouth once-daily on Mondays, Wednesdays  and Fridays.  All other days, take only 1/2 tablet.  Patient verbalized understanding of these instructions.     Patient advised to contact clinic or seek medical attention if signs/symptoms of bleeding or thromboembolism occur.  Patient verbalized understanding by repeating back information and was advised to contact me if further medication-related questions arise. Patient was also provided an information handout.  Follow-up Return in 4 weeks (on 05/09/2018) for Follow up INR at 0930h.  Pennie Banter,  PharmD, CPP  15 minutes spent face-to-face with the patient during the encounter. 50% of time spent on education. 50% of time was spent on fingerstick point of care INR sample collection, processing, results determination, dose adjustment, and documentation in http://www.kim.net/.

## 2018-04-11 NOTE — Telephone Encounter (Signed)
Patient requesting prescription to get shingles vaccine.  Would like to pick it up on her next office visit on 05-09-18 when she gets her coumadin checked.

## 2018-04-11 NOTE — Patient Instructions (Signed)
Patient instructed to take medications as defined in the Anti-coagulation Track section of this encounter.  Patient instructed to take today's dose.  Patient instructed to take 1 tablet of your 4mg blue colored warfarin tablets by mouth once-daily on Mondays, Wednesdays  and Fridays.  All other days, take only 1/2 tablet.  Patient verbalized understanding of these instructions.    

## 2018-04-11 NOTE — Progress Notes (Signed)
INTERNAL MEDICINE TEACHING ATTENDING ADDENDUM - Mariapaula Krist M.D  Duration- indefinite, Indication- recurrent DVT, Factor II mutation, INR- theraeputic. Agree with pharmacy recommendations as outlined in their note.

## 2018-04-11 NOTE — Telephone Encounter (Signed)
Will leave with triage nurses.

## 2018-04-22 ENCOUNTER — Ambulatory Visit
Admission: RE | Admit: 2018-04-22 | Discharge: 2018-04-22 | Disposition: A | Discharge status: Home / Self Care | Payer: Medicare Other | Source: Ambulatory Visit | Attending: Obstetrics and Gynecology | Admitting: Obstetrics and Gynecology

## 2018-04-22 DIAGNOSIS — Z1231 Encounter for screening mammogram for malignant neoplasm of breast: Secondary | ICD-10-CM

## 2018-05-09 ENCOUNTER — Ambulatory Visit (INDEPENDENT_AMBULATORY_CARE_PROVIDER_SITE_OTHER): Payer: Medicare Other

## 2018-05-09 DIAGNOSIS — Z5181 Encounter for therapeutic drug level monitoring: Secondary | ICD-10-CM

## 2018-05-09 DIAGNOSIS — E538 Deficiency of other specified B group vitamins: Secondary | ICD-10-CM

## 2018-05-09 DIAGNOSIS — Z86718 Personal history of other venous thrombosis and embolism: Secondary | ICD-10-CM | POA: Diagnosis not present

## 2018-05-09 DIAGNOSIS — Z7901 Long term (current) use of anticoagulants: Secondary | ICD-10-CM | POA: Diagnosis not present

## 2018-05-09 DIAGNOSIS — D689 Coagulation defect, unspecified: Secondary | ICD-10-CM | POA: Diagnosis not present

## 2018-05-09 LAB — POCT INR: INR: 2.7 (ref 2.0–3.0)

## 2018-05-09 NOTE — Progress Notes (Signed)
Pt presents for vit B-12 injection.  Given IM to R deltoid, tolerated well.  [place], RN

## 2018-05-09 NOTE — Progress Notes (Signed)
Anticoagulation Management Pamela [name] is a 72 y.o. female who reports to the clinic for monitoring of warfarin treatment.    Indication: Clotting disorder, history of resolved DVT  Duration: indefinite Supervising physician: [place] [facility] Visit History: Patient does not report signs/symptoms of bleeding or thromboembolism. Other recent changes: No diet, medications, lifestyle endorsed by patient. Anticoagulation Episode Summary    Current INR goal:   2.0-3.0  TTR:   71.8 % (7.7 y)  Next INR check:   06/20/2018  INR from last check:   2.7 (05/09/2018)  Weekly max warfarin dose:     Target end date:   Indefinite  INR check location:   Anticoagulation Clinic  Preferred lab:     Send INR reminders to:   [facility]   Indications   Clotting disorder ([place]) [D68.9] DVT HX OF (Resolved) [Z61.096]       Comments:         Anticoagulation Care Providers    Provider Role Specialty Phone number   Burman Freestone, MD  Internal Medicine [phone]      Allergies  Allergen Reactions  . Ace Inhibitors Cough   Prior to Admission medications   Medication Sig Start Date End Date Taking? Authorizing Provider  colestipol (COLESTID) 1 g tablet TAKE 6 TABLET BY MOUTH QD 02/24/18   Bartholomew Crews, MD  fluticasone [facility]) 50 MCG/ACT nasal spray Place 2 sprays into both nostrils daily. 07/19/17   Ok Edwards, PA-C  glucosamine-chondroitin 500-400 MG tablet Take 1 tablet by mouth 3 (three) times daily.    [provider]  levothyroxine (SYNTHROID, LEVOTHROID) 88 MCG tablet TAKE ONE (1) TABLET BY MOUTH EVERY DAY 02/03/18   Bartholomew Crews, MD  losartan (COZAAR) 25 MG tablet TAKE ONE (1) TABLET BY MOUTH EVERY DAY 12/01/17   Bartholomew Crews, MD  Multiple Vitamins-Calcium (DAILY COMBO MULTIVITS/CALCIUM) TABS Take 1 tablet by mouth daily.      [provider]  pantoprazole (PROTONIX) 40 MG tablet TAKE ONE (1) TABLET BY MOUTH EVERY DAY  09/20/17   Bartholomew Crews, MD  warfarin (COUMADIN) 4 MG tablet Take one tablet on Mon / Wed / Fri. Take one-half tablet all other days. Dose will change frequently. 11/02/16   Bartholomew Crews, MD  warfarin (COUMADIN) 4 MG tablet TAKE 1 TABLET BY MOUTH ON MONDAYS AND FRIDAYS.  TAKE 1/2 A TABLET BY MOUTH ALL OTHER DAYS. 08/05/17   Bartholomew Crews, MD   Past Medical History:  Diagnosis Date  . CHOLELITHIASIS, WITH OBSTRUCTION 04/21/2006   s/p ERCP,sprincterotomy, stent ([facility])  . 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 GLAUCOMA 04/24/2008  . HYPERLIPIDEMIA 06/03/2006  . Hypothyroidism lifelong  . OBESITY, MILD 04/24/2008  . Tenosynovitis 01/2004   [place]her   Social History   Socioeconomic History  . Marital status: Widowed    Spouse name: Not on file  . Number of children: Not on file  . Years of education: Not on file  . Highest education level: Not on file  Occupational History  . 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 Financial resource strain: Not on file  . Food insecurity:    Worry: Not on file    Inability: Not on file  . Transportation needs:    Medical: Not on file    Non-medical: Not on file  Tobacco Use  . Smoking status: Former Smoker    Packs/day: 1.00    Years: 20.00    Pack years: 20.00    Last attempt to quit: 07/06/1985    Years since quitting: 32.8  . Tobacco comment: Quit 1988  Substance and Sexual Activity  . Alcohol use: Yes    Comment: 2-3 glasses of wine daily.  . Drug use: No  . Sexual activity: Not on file  Lifestyle  . Physical activity:    Days per week: Not on file    Minutes per session: Not on file  . Stress: Not on file  Relationships  . Social connections:    Talks on phone: Not on file    Gets together: Not on file    Attends religious service: Not on file    Active member of club or organization: Not on file     Attends meetings of clubs or organizations: Not on file    Relationship status: Not on file  Other Topics Concern  . Not on file  Social History Narrative   Married, no regular exercise.   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    Family History  Problem Relation Age of Onset  . Cancer Mother        H&N, smoker  . Cancer Sister        lung, 2011, smoker  . Other Other        grandmother had mastectomy in her 18's unknown reason  . Breast cancer Neg Hx     ASSESSMENT Recent Results: The most recent result is correlated with 20 mg per week: Lab Results  Component Value Date   INR 2.7 05/09/2018   INR 2.0 04/11/2018   INR 3.2 (A) 03/14/2018    Anticoagulation Dosing: Description   Take 1 tablet of your 4mg  blue colored warfarin tablets by mouth once-daily on Mondays, Wednesdays  and Fridays.  All other days, take only 1/2 tablet.      INR today: Therapeutic  PLAN Weekly dose was unchange.  Patient Instructions  Patient instructed to take medications as defined in the Anti-coagulation Track section of this encounter.  Patient instructed to take today's dose. Patient instructed to take 1 tablet of your 4mg  blue colored warfarin tablets by mouth once-daily on Mondays, Wednesdays  and Fridays.  All other days, take only 1/2 tablet.  Patient verbalized understanding of these instructions.      Patient advised to contact clinic or seek medical attention if signs/symptoms of bleeding or thromboembolism occur.  Patient verbalized understanding by repeating back information and was advised to contact me if further medication-related questions arise. Patient was also provided an information handout.  Follow-up Return in about 6 weeks (around 06/20/2018), or Follow up INR at [facility], PharmD PGY1 Pharmacy Resident 05/09/2018    10:05 AM  15 minutes spent face-to-face  with the patient during the encounter. 50% of time spent on education. 50% of time was spent on fingerstick point-of-care preparation, testing, analyzing, and results determination and documentation in [email or web address]/.

## 2018-05-09 NOTE — Patient Instructions (Signed)
Patient instructed to take medications as defined in the Anti-coagulation Track section of this encounter.  Patient instructed to take today's dose.  Patient instructed to take 1 tablet of your 4mg blue colored warfarin tablets by mouth once-daily on Mondays, Wednesdays  and Fridays.  All other days, take only 1/2 tablet.  Patient verbalized understanding of these instructions.    

## 2018-06-20 ENCOUNTER — Ambulatory Visit (INDEPENDENT_AMBULATORY_CARE_PROVIDER_SITE_OTHER): Payer: Medicare Other | Admitting: Pharmacist

## 2018-06-20 ENCOUNTER — Ambulatory Visit (INDEPENDENT_AMBULATORY_CARE_PROVIDER_SITE_OTHER): Payer: Medicare Other | Admitting: *Deleted

## 2018-06-20 ENCOUNTER — Encounter: Payer: Self-pay | Admitting: Pharmacist

## 2018-06-20 DIAGNOSIS — D689 Coagulation defect, unspecified: Secondary | ICD-10-CM | POA: Diagnosis not present

## 2018-06-20 DIAGNOSIS — Z86718 Personal history of other venous thrombosis and embolism: Secondary | ICD-10-CM

## 2018-06-20 DIAGNOSIS — Z5181 Encounter for therapeutic drug level monitoring: Secondary | ICD-10-CM

## 2018-06-20 DIAGNOSIS — Z7901 Long term (current) use of anticoagulants: Secondary | ICD-10-CM | POA: Diagnosis not present

## 2018-06-20 DIAGNOSIS — E538 Deficiency of other specified B group vitamins: Secondary | ICD-10-CM

## 2018-06-20 LAB — POCT INR: INR: 2.6 (ref 2.0–3.0)

## 2018-06-20 NOTE — Progress Notes (Signed)
Patient presents for Vit B12 injection. Administered without incident. Given Rx for shingrix from PCP. Patient made aware she may also call [facility] outpatient pharmacy and schedule appt to receive shingrix without a Rx. Hubbard Hartshorn, RN, BSN

## 2018-06-20 NOTE — Progress Notes (Signed)
Anticoagulation Management Pamela [name] is a 72 y.o. female who reports to the clinic for monitoring of warfarin treatment.    Indication: Clotting disorder, DVT--History of (Resolved), long term current use of anticoagulant.    Duration: indefinite Supervising physician: Joni Reining  Anticoagulation Clinic Visit History: Patient does not report signs/symptoms of bleeding or thromboembolism  Other recent changes: No diet, medications, lifestyle changes endorsed.  Anticoagulation Episode Summary    Current INR goal:   2.0-3.0  TTR:   72.2 % (7.8 y)  Next INR check:   08/01/2018  INR from last check:     Weekly max warfarin dose:     Target end date:   Indefinite  INR check location:   Anticoagulation Clinic  Preferred lab:     Send INR reminders to:   [facility]   Indications   Clotting disorder ([place]) [D68.9] DVT HX OF (Resolved) [Q68.341]       Comments:         Anticoagulation Care Providers    Provider Role Specialty Phone number   Burman Freestone, MD  Internal Medicine [phone]      Allergies  Allergen Reactions  . Ace Inhibitors Cough   Prior to Admission medications   Medication Sig Start Date End Date Taking? Authorizing Provider  colestipol (COLESTID) 1 g tablet TAKE 6 TABLET BY MOUTH QD 02/24/18  Yes Bartholomew Crews, MD  fluticasone [facility]) 50 MCG/ACT nasal spray Place 2 sprays into both nostrils daily. 07/19/17  Yes Yu, Amy V, PA-C  glucosamine-chondroitin 500-400 MG tablet Take 1 tablet by mouth 3 (three) times daily.   Yes [provider]  levothyroxine (SYNTHROID, LEVOTHROID) 88 MCG tablet TAKE ONE (1) TABLET BY MOUTH EVERY DAY 02/03/18  Yes Bartholomew Crews, MD  losartan (COZAAR) 25 MG tablet TAKE ONE (1) TABLET BY MOUTH EVERY DAY 12/01/17  Yes Bartholomew Crews, MD  Multiple Vitamins-Calcium (DAILY COMBO MULTIVITS/CALCIUM) TABS Take 1 tablet by mouth daily.     Yes [provider]  pantoprazole (PROTONIX) 40 MG  tablet TAKE ONE (1) TABLET BY MOUTH EVERY DAY 09/20/17  Yes Bartholomew Crews, MD  warfarin (COUMADIN) 4 MG tablet Take one tablet on Mon / Wed / Fri. Take one-half tablet all other days. Dose will change frequently. 11/02/16  Yes Bartholomew Crews, MD  warfarin (COUMADIN) 4 MG tablet TAKE 1 TABLET BY MOUTH ON MONDAYS AND FRIDAYS.  TAKE 1/2 A TABLET BY MOUTH ALL OTHER DAYS. 08/05/17  Yes Bartholomew Crews, MD   Past Medical History:  Diagnosis Date  . CHOLELITHIASIS, WITH OBSTRUCTION 04/21/2006   s/p ERCP,sprincterotomy, stent ([facility])  . 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 GLAUCOMA 04/24/2008  . HYPERLIPIDEMIA 06/03/2006  . Hypothyroidism lifelong  . OBESITY, MILD 04/24/2008  . Tenosynovitis 01/2004   Sypher   Social History   Socioeconomic History  . Marital status: Widowed    Spouse name: Not on file  . Number of children: Not on file  . Years of education: Not on file  . Highest education level: Not on file  Occupational History  . 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 Financial resource strain: Not on file  . Food insecurity:    Worry: Not on file    Inability: Not on file  . Transportation needs:    Medical: Not on file  Non-medical: Not on file  Tobacco Use  . Smoking status: Former Smoker    Packs/day: 1.00    Years: 20.00    Pack years: 20.00    Last attempt to quit: 07/06/1985    Years since quitting: 32.9  . Tobacco comment: Quit 1988  Substance and Sexual Activity  . Alcohol use: Yes    Comment: 2-3 glasses of wine daily.  . Drug use: No  . Sexual activity: Not on file  Lifestyle  . Physical activity:    Days per week: Not on file    Minutes per session: Not on file  . Stress: Not on file  Relationships  . Social connections:    Talks on phone: Not on file    Gets together: Not on file    Attends religious service: Not on file     Active member of club or organization: Not on file    Attends meetings of clubs or organizations: Not on file    Relationship status: Not on file  Other Topics Concern  . Not on file  Social History Narrative   Married, no regular exercise.   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    Family History  Problem Relation Age of Onset  . Cancer Mother        H&N, smoker  . Cancer Sister        lung, 2011, smoker  . Other Other        grandmother had mastectomy in her 79's unknown reason  . Breast cancer Neg Hx     ASSESSMENT Recent Results: The most recent result is correlated with 20 mg per week: Lab Results  Component Value Date   INR 2.6 06/20/2018   INR 2.7 05/09/2018   INR 2.0 04/11/2018    Anticoagulation Dosing: Description   Take 1 tablet of your 4mg  blue colored warfarin tablets by mouth once-daily on Mondays, Wednesdays  and Fridays.  All other days, take only 1/2 tablet.      INR today: Therapeutic  PLAN Weekly dose was unchanged.   Patient Instructions  Patient instructed to take medications as defined in the Anti-coagulation Track section of this encounter.  Patient instructed to take today's dose.  Patient instructed to take 1 tablet of your 4mg  blue colored warfarin tablets by mouth once-daily on Mondays, Wednesdays  and Fridays.  All other days, take only 1/2 tablet. Patient verbalized understanding of these instructions.     Patient advised to contact clinic or seek medical attention if signs/symptoms of bleeding or thromboembolism occur.  Patient verbalized understanding by repeating back information and was advised to contact me if further medication-related questions arise. Patient was also provided an information handout.  Follow-up Return in about 6 weeks (around 08/01/2018) for Follow up INR at 0900h.  Pennie Banter, PharmD, CPP  15 minutes spent  face-to-face with the patient during the encounter. 50% of time spent on education. 50% of time was spent on fingerstick point of care INR sample collection, processing, results determination, dose adjustment and documentation in http://www.kim.net/.

## 2018-06-20 NOTE — Patient Instructions (Signed)
Patient instructed to take medications as defined in the Anti-coagulation Track section of this encounter.  Patient instructed to take today's dose.  Patient instructed to take 1 tablet of your 4mg  blue colored warfarin tablets by mouth once-daily on Mondays, Wednesdays  and Fridays.  All other days, take only 1/2 tablet. Patient verbalized understanding of these instructions.

## 2018-07-14 DIAGNOSIS — H2513 Age-related nuclear cataract, bilateral: Secondary | ICD-10-CM | POA: Insufficient documentation

## 2018-07-14 HISTORY — DX: Age-related nuclear cataract, bilateral: H25.13

## 2018-08-01 ENCOUNTER — Other Ambulatory Visit (INDEPENDENT_AMBULATORY_CARE_PROVIDER_SITE_OTHER): Payer: Medicare Other

## 2018-08-01 ENCOUNTER — Ambulatory Visit (INDEPENDENT_AMBULATORY_CARE_PROVIDER_SITE_OTHER): Payer: Medicare Other | Admitting: *Deleted

## 2018-08-01 ENCOUNTER — Encounter: Payer: Self-pay | Admitting: Pharmacist

## 2018-08-01 ENCOUNTER — Ambulatory Visit (INDEPENDENT_AMBULATORY_CARE_PROVIDER_SITE_OTHER): Payer: Medicare Other | Admitting: Pharmacist

## 2018-08-01 DIAGNOSIS — Z7901 Long term (current) use of anticoagulants: Secondary | ICD-10-CM

## 2018-08-01 DIAGNOSIS — Z86718 Personal history of other venous thrombosis and embolism: Secondary | ICD-10-CM | POA: Diagnosis not present

## 2018-08-01 DIAGNOSIS — E538 Deficiency of other specified B group vitamins: Secondary | ICD-10-CM

## 2018-08-01 DIAGNOSIS — D689 Coagulation defect, unspecified: Secondary | ICD-10-CM | POA: Diagnosis not present

## 2018-08-01 DIAGNOSIS — Z5181 Encounter for therapeutic drug level monitoring: Secondary | ICD-10-CM

## 2018-08-01 LAB — POCT INR: INR: 2.5 (ref 2.0–3.0)

## 2018-08-01 NOTE — Patient Instructions (Signed)
Patient instructed to take medications as defined in the Anti-coagulation Track section of this encounter.  Patient instructed to take today's dose.  Patient instructed to take 1 tablet of your 4mg  blue colored warfarin tablets by mouth once-daily on Mondays, Wednesdays  and Fridays.  All other days, take only 1/2 tablet.  Patient verbalized understanding of these instructions.

## 2018-08-01 NOTE — Progress Notes (Signed)
Anticoagulation Management Pamela [name] is a 73 y.o. female who reports to the clinic for monitoring of warfarin treatment.    Indication: Clotting disorder, DVT, HX of (Resolved), long term (current) use of anticoagulant.    Duration: indefinite Supervising physician: Aldine Contes  Anticoagulation Clinic Visit History: Patient does not report signs/symptoms of bleeding or thromboembolism  Other recent changes: No diet, medications, lifestyle changes.  Anticoagulation Episode Summary    Current INR goal:   2.0-3.0  TTR:   72.6 % (7.9 y)  Next INR check:   08/29/2018  INR from last check:   2.5 (08/01/2018)  Weekly max warfarin dose:     Target end date:   Indefinite  INR check location:   Anticoagulation Clinic  Preferred lab:     Send INR reminders to:   ANT[facility]   Indications   Clotting disorder ([place]) [D68.9] DVT HX OF (Resolved) [V67.209]       Comments:         Anticoagulation Care Providers    Provider Role Specialty Phone number   Burman Freestone, MD  Internal Medicine [phone]      Allergies  Allergen Reactions  . Ace Inhibitors Cough   Prior to Admission medications   Medication Sig Start Date End Date Taking? Authorizing Provider  colestipol (COLESTID) 1 g tablet TAKE 6 TABLET BY MOUTH QD 02/24/18  Yes Bartholomew Crews, MD  fluticasone [facility]) 50 MCG/ACT nasal spray Place 2 sprays into both nostrils daily. 07/19/17  Yes Yu, Amy V, PA-C  glucosamine-chondroitin 500-400 MG tablet Take 1 tablet by mouth 3 (three) times daily.   Yes [provider]  levothyroxine (SYNTHROID, LEVOTHROID) 88 MCG tablet TAKE ONE (1) TABLET BY MOUTH EVERY DAY 02/03/18  Yes Bartholomew Crews, MD  losartan (COZAAR) 25 MG tablet TAKE ONE (1) TABLET BY MOUTH EVERY DAY 12/01/17  Yes Bartholomew Crews, MD  Multiple Vitamins-Calcium (DAILY COMBO MULTIVITS/CALCIUM) TABS Take 1 tablet by mouth daily.     Yes [provider]  pantoprazole (PROTONIX) 40  MG tablet TAKE ONE (1) TABLET BY MOUTH EVERY DAY 09/20/17  Yes Bartholomew Crews, MD  warfarin (COUMADIN) 4 MG tablet Take one tablet on Mon / Wed / Fri. Take one-half tablet all other days. Dose will change frequently. 11/02/16  Yes Bartholomew Crews, MD  warfarin (COUMADIN) 4 MG tablet TAKE 1 TABLET BY MOUTH ON MONDAYS AND FRIDAYS.  TAKE 1/2 A TABLET BY MOUTH ALL OTHER DAYS. 08/05/17  Yes Bartholomew Crews, MD   Past Medical History:  Diagnosis Date  . CHOLELITHIASIS, WITH OBSTRUCTION 04/21/2006   s/p ERCP,sprincterotomy, stent ([facility])  . 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 GLAUCOMA 04/24/2008  . HYPERLIPIDEMIA 06/03/2006  . Hypothyroidism lifelong  . OBESITY, MILD 04/24/2008  . Tenosynovitis 01/2004   Sypher   Social History   Socioeconomic History  . Marital status: Widowed    Spouse name: Not on file  . Number of children: Not on file  . Years of education: Not on file  . Highest education level: Not on file  Occupational History  . 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 Financial resource strain: Not on file  . Food insecurity:    Worry: Not on file    Inability: Not on file  . Transportation needs:    Medical: Not on  file    Non-medical: Not on file  Tobacco Use  . Smoking status: Former Smoker    Packs/day: 1.00    Years: 20.00    Pack years: 20.00    Last attempt to quit: 07/06/1985    Years since quitting: 33.0  . Tobacco comment: Quit 1988  Substance and Sexual Activity  . Alcohol use: Yes    Comment: 2-3 glasses of wine daily.  . Drug use: No  . Sexual activity: Not on file  Lifestyle  . Physical activity:    Days per week: Not on file    Minutes per session: Not on file  . Stress: Not on file  Relationships  . Social connections:    Talks on phone: Not on file    Gets together: Not on file    Attends religious service: Not on  file    Active member of club or organization: Not on file    Attends meetings of clubs or organizations: Not on file    Relationship status: Not on file  Other Topics Concern  . Not on file  Social History Narrative   Married, no regular exercise.   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    Family History  Problem Relation Age of Onset  . Cancer Mother        H&N, smoker  . Cancer Sister        lung, 2011, smoker  . Other Other        grandmother had mastectomy in her 30's unknown reason  . Breast cancer Neg Hx     ASSESSMENT Recent Results: The most recent result is correlated with 20mg  per week: Lab Results  Component Value Date   INR 2.5 08/01/2018   INR 2.6 06/20/2018   INR 2.7 05/09/2018    Anticoagulation Dosing: Description   Take 1 tablet of your 4mg  blue colored warfarin tablets by mouth once-daily on Mondays, Wednesdays  and Fridays.  All other days, take only 1/2 tablet.      INR today: Therapeutic  PLAN Weekly dose was unchanged. Dose will remain 20mg /wk   Patient Instructions  Patient instructed to take medications as defined in the Anti-coagulation Track section of this encounter.  Patient instructed to take today's dose.  Patient instructed to take 1 tablet of your 4mg  blue colored warfarin tablets by mouth once-daily on Mondays, Wednesdays  and Fridays.  All other days, take only 1/2 tablet.  Patient verbalized understanding of these instructions.     Patient advised to contact clinic or seek medical attention if signs/symptoms of bleeding or thromboembolism occur.  Patient verbalized understanding by repeating back information and was advised to contact me if further medication-related questions arise. Patient was also provided an information handout.  Follow-up Return in 4 weeks (on 08/29/2018) for Follow up INR at 0915h.  Pennie Banter, PharmD, CPP  15  minutes spent face-to-face with the patient during the encounter. 50% of time spent on education. 50% of time was spent on fingerstick point of care INR sample collection, processing, results determination, and documentation in http://www.kim.net/.

## 2018-08-01 NOTE — Progress Notes (Signed)
INTERNAL MEDICINE TEACHING ATTENDING ADDENDUM - Tashai Catino M.D  Duration- indefinite, Indication- factor 2 mutation, recurrent DVT, INR- therapeutic. Agree with pharmacy recommendations as outlined in their note.

## 2018-08-02 ENCOUNTER — Encounter: Payer: Self-pay | Admitting: Internal Medicine

## 2018-08-02 LAB — VITAMIN B12

## 2018-08-29 ENCOUNTER — Ambulatory Visit (INDEPENDENT_AMBULATORY_CARE_PROVIDER_SITE_OTHER): Payer: Medicare Other | Admitting: Pharmacist

## 2018-08-29 DIAGNOSIS — Z7901 Long term (current) use of anticoagulants: Secondary | ICD-10-CM

## 2018-08-29 DIAGNOSIS — Z5181 Encounter for therapeutic drug level monitoring: Secondary | ICD-10-CM

## 2018-08-29 DIAGNOSIS — Z86718 Personal history of other venous thrombosis and embolism: Secondary | ICD-10-CM | POA: Diagnosis not present

## 2018-08-29 DIAGNOSIS — D689 Coagulation defect, unspecified: Secondary | ICD-10-CM | POA: Diagnosis not present

## 2018-08-29 LAB — POCT INR: INR: 3.4 — AB (ref 2.0–3.0)

## 2018-08-29 NOTE — Progress Notes (Signed)
Indication: G20210A Factor II mutation with recurrent deep venous thrombosis. Duration: Indefinite. INR: Above target. Agree with Dr. Catalina Lunger assessment and plan.

## 2018-08-29 NOTE — Progress Notes (Signed)
Anticoagulation Management Pamela [name] is a 73 y.o. female who reports to the clinic for monitoring of warfarin treatment.    Indication: clotting disorder, DVT, HX of (Resolved), long term (current) use of anticoagulant  Duration: indefinite Supervising physician: [place] [facility] Visit History: Patient does not report signs/symptoms of bleeding or thromboembolism at this time. Other recent changes: No diet, medications, lifestyle changes endorsed. Anticoagulation Episode Summary    Current INR goal:   2.0-3.0  TTR:   72.4 % (8 y)  Next INR check:   09/05/2018  INR from last check:   3.4! (08/29/2018)  Weekly max warfarin dose:     Target end date:   Indefinite  INR check location:   Anticoagulation Clinic  Preferred lab:     Send INR reminders to:   ANTICOAG IMP   Indications   Clotting disorder ([place]) [D68.9] DVT HX OF (Resolved) [V95.638]       Comments:         Anticoagulation Care Providers    Provider Role Specialty Phone number   Burman Freestone, MD  Internal Medicine [phone]      Allergies  Allergen Reactions  . Ace Inhibitors Cough   Prior to Admission medications   Medication Sig Start Date End Date Taking? Authorizing Provider  colestipol (COLESTID) 1 g tablet TAKE 6 TABLET BY MOUTH QD 02/24/18  Yes Bartholomew Crews, MD  fluticasone [facility]) 50 MCG/ACT nasal spray Place 2 sprays into both nostrils daily. 07/19/17  Yes Yu, Amy V, PA-C  glucosamine-chondroitin 500-400 MG tablet Take 1 tablet by mouth 3 (three) times daily.   Yes [provider]  levothyroxine (SYNTHROID, LEVOTHROID) 88 MCG tablet TAKE ONE (1) TABLET BY MOUTH EVERY DAY 02/03/18  Yes Bartholomew Crews, MD  losartan (COZAAR) 25 MG tablet TAKE ONE (1) TABLET BY MOUTH EVERY DAY 12/01/17  Yes Bartholomew Crews, MD  Multiple Vitamins-Calcium (DAILY COMBO MULTIVITS/CALCIUM) TABS Take 1 tablet by mouth daily.     Yes [provider]  pantoprazole  (PROTONIX) 40 MG tablet TAKE ONE (1) TABLET BY MOUTH EVERY DAY 09/20/17  Yes Bartholomew Crews, MD  warfarin (COUMADIN) 4 MG tablet Take one tablet on Mon / Wed / Fri. Take one-half tablet all other days. Dose will change frequently. 11/02/16  Yes Bartholomew Crews, MD  warfarin (COUMADIN) 4 MG tablet TAKE 1 TABLET BY MOUTH ON MONDAYS AND FRIDAYS.  TAKE 1/2 A TABLET BY MOUTH ALL OTHER DAYS. 08/05/17  Yes Bartholomew Crews, MD   Past Medical History:  Diagnosis Date  . CHOLELITHIASIS, WITH OBSTRUCTION 04/21/2006   s/p ERCP,sprincterotomy, stent ([facility])  . 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 GLAUCOMA 04/24/2008  . HYPERLIPIDEMIA 06/03/2006  . Hypothyroidism lifelong  . OBESITY, MILD 04/24/2008  . Tenosynovitis 01/2004   S[place]   Social History   Socioeconomic History  . Marital status: Widowed    Spouse name: Not on file  . Number of children: Not on file  . Years of education: Not on file  . Highest education level: Not on file  Occupational History  . 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 Financial resource strain: Not on file  . Food insecurity:    Worry: Not on file    Inability: Not on file  . Transportation needs:    Medical: Not on  file    Non-medical: Not on file  Tobacco Use  . Smoking status: Former Smoker    Packs/day: 1.00    Years: 20.00    Pack years: 20.00    Last attempt to quit: 07/06/1985    Years since quitting: 33.1  . Tobacco comment: Quit 1988  Substance and Sexual Activity  . Alcohol use: Yes    Comment: 2-3 glasses of wine daily.  . Drug use: No  . Sexual activity: Not on file  Lifestyle  . Physical activity:    Days per week: Not on file    Minutes per session: Not on file  . Stress: Not on file  Relationships  . Social connections:    Talks on phone: Not on file    Gets together: Not on file    Attends religious  service: Not on file    Active member of club or organization: Not on file    Attends meetings of clubs or organizations: Not on file    Relationship status: Not on file  Other Topics Concern  . Not on file  Social History Narrative   Married, no regular exercise.   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    Family History  Problem Relation Age of Onset  . Cancer Mother        H&N, smoker  . Cancer Sister        lung, 2011, smoker  . Other Other        grandmother had mastectomy in her 32's unknown reason  . Breast cancer Neg Hx     ASSESSMENT Recent Results: The most recent result is correlated with 20 mg per week: Lab Results  Component Value Date   INR 3.4 (A) 08/29/2018   INR 2.5 08/01/2018   INR 2.6 06/20/2018    Anticoagulation Dosing: Description   Take 1 tablet of your 4mg  blue colored warfarin tablets by mouth once-daily on Mondays and Thursdays. All other days, take only 1/2 tablet.      INR today: Supratherapeutic  PLAN Weekly dose was decreased by 10% to 18 mg per week  Patient Instructions  Patient instructed to take medications as defined in the Anti-coagulation Track section of this encounter.  Patient reported already taking today's dose. Patient took 4 mg warfarin prior to visit.  Patient verbalized understanding of these instructions.  Patient instructed to take 1 tablet of your 4mg  blue colored warfarin tablets by mouth once-daily on Mondays and Thursdays. All other days, take only 1/2 tablet.    Patient advised to contact clinic or seek medical attention if signs/symptoms of bleeding or thromboembolism occur.  Patient verbalized understanding by repeating back information and was advised to contact me if further medication-related questions arise. Patient was also provided an information handout.  Follow-up Return in 1 week (on 09/05/2018) for INR follow-up at  08:45.  15 minutes spent face-to-face with the patient during the encounter. 50% of time spent on education, including signs/sx bleeding and clotting, as well as food and drug interactions with warfarin. 50% of time was spent on fingerprick POC INR sample collection,processing, results determination, and documentation in [email or web address].kim.net/.  Thank you for allowing pharmacy to be a part of this patient's care.  Leron Croak, PharmD PGY1 Pharmacy Resident

## 2018-08-29 NOTE — Patient Instructions (Signed)
Patient instructed to take medications as defined in the Anti-coagulation Track section of this encounter.  Patient reported already taking today's dose. Patient took 4 mg warfarin prior to visit.  Patient verbalized understanding of these instructions.  Patient instructed to take 1 tablet of your 4mg  blue colored warfarin tablets by mouth once-daily on Mondays and Thursdays. All other days, take only 1/2 tablet.

## 2018-09-05 ENCOUNTER — Ambulatory Visit (INDEPENDENT_AMBULATORY_CARE_PROVIDER_SITE_OTHER): Payer: Medicare Other

## 2018-09-05 DIAGNOSIS — Z86718 Personal history of other venous thrombosis and embolism: Secondary | ICD-10-CM | POA: Diagnosis not present

## 2018-09-05 DIAGNOSIS — Z5181 Encounter for therapeutic drug level monitoring: Secondary | ICD-10-CM | POA: Diagnosis not present

## 2018-09-05 DIAGNOSIS — D689 Coagulation defect, unspecified: Secondary | ICD-10-CM

## 2018-09-05 DIAGNOSIS — Z7901 Long term (current) use of anticoagulants: Secondary | ICD-10-CM

## 2018-09-05 LAB — POCT INR: INR: 3.6 — AB (ref 2.0–3.0)

## 2018-09-05 NOTE — Progress Notes (Signed)
Anticoagulation Management Pamela [name] is a 73 y.o. female who reports to the clinic for monitoring of warfarin treatment.    Indication: Clotting disorder, hx of DVT  Duration: indefinite Supervising physician: Aldine Contes  Anticoagulation Clinic Visit History: Patient does not report signs/symptoms of bleeding or thromboembolism  Other recent changes: No diet, medications, lifestyle changes reported by pt Anticoagulation Episode Summary    Current INR goal:   2.0-3.0  TTR:   72.2 % (8 y)  Next INR check:   09/12/2018  INR from last check:   3.6! (09/05/2018)  Weekly max warfarin dose:     Target end date:   Indefinite  INR check location:   Anticoagulation Clinic  Preferred lab:     Send INR reminders to:   [facility]   Indications   Clotting disorder ([place]) [D68.9] DVT HX OF (Resolved) [B51.025]       Comments:         Anticoagulation Care Providers    Provider Role Specialty Phone number   Burman Freestone, MD  Internal Medicine [phone]      Allergies  Allergen Reactions  . Ace Inhibitors Cough   Prior to Admission medications   Medication Sig Start Date End Date Taking? Authorizing Provider  colestipol (COLESTID) 1 g tablet TAKE 6 TABLET BY MOUTH QD 02/24/18   Bartholomew Crews, MD  fluticasone [facility]) 50 MCG/ACT nasal spray Place 2 sprays into both nostrils daily. 07/19/17   Ok Edwards, PA-C  glucosamine-chondroitin 500-400 MG tablet Take 1 tablet by mouth 3 (three) times daily.    [provider]  levothyroxine (SYNTHROID, LEVOTHROID) 88 MCG tablet TAKE ONE (1) TABLET BY MOUTH EVERY DAY 02/03/18   Bartholomew Crews, MD  losartan (COZAAR) 25 MG tablet TAKE ONE (1) TABLET BY MOUTH EVERY DAY 12/01/17   Bartholomew Crews, MD  Multiple Vitamins-Calcium (DAILY COMBO MULTIVITS/CALCIUM) TABS Take 1 tablet by mouth daily.      [provider]  pantoprazole (PROTONIX) 40 MG tablet TAKE ONE (1) TABLET BY MOUTH EVERY DAY 09/20/17    Bartholomew Crews, MD  warfarin (COUMADIN) 4 MG tablet Take one tablet on Mon / Wed / Fri. Take one-half tablet all other days. Dose will change frequently. 11/02/16   Bartholomew Crews, MD  warfarin (COUMADIN) 4 MG tablet TAKE 1 TABLET BY MOUTH ON MONDAYS AND FRIDAYS.  TAKE 1/2 A TABLET BY MOUTH ALL OTHER DAYS. 08/05/17   Bartholomew Crews, MD   Past Medical History:  Diagnosis Date  . CHOLELITHIASIS, WITH OBSTRUCTION 04/21/2006   s/p ERCP,sprincterotomy, stent ([facility])  . 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 GLAUCOMA 04/24/2008  . HYPERLIPIDEMIA 06/03/2006  . Hypothyroidism lifelong  . OBESITY, MILD 04/24/2008  . Tenosynovitis 01/2004   [place]   Social History   Socioeconomic History  . Marital status: Widowed    Spouse name: Not on file  . Number of children: Not on file  . Years of education: Not on file  . Highest education level: Not on file  Occupational History  . 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 Financial resource strain: Not on file  . Food insecurity:    Worry: Not on file    Inability: Not on file  . Transportation needs:    Medical: Not on file    Non-medical: Not on  file  Tobacco Use  . Smoking status: Former Smoker    Packs/day: 1.00    Years: 20.00    Pack years: 20.00    Last attempt to quit: 07/06/1985    Years since quitting: 33.1  . Tobacco comment: Quit 1988  Substance and Sexual Activity  . Alcohol use: Yes    Comment: 2-3 glasses of wine daily.  . Drug use: No  . Sexual activity: Not on file  Lifestyle  . Physical activity:    Days per week: Not on file    Minutes per session: Not on file  . Stress: Not on file  Relationships  . Social connections:    Talks on phone: Not on file    Gets together: Not on file    Attends religious service: Not on file    Active member of club or organization: Not on file     Attends meetings of clubs or organizations: Not on file    Relationship status: Not on file  Other Topics Concern  . Not on file  Social History Narrative   Married, no regular exercise.   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    Family History  Problem Relation Age of Onset  . Cancer Mother        H&N, smoker  . Cancer Sister        lung, 2011, smoker  . Other Other        grandmother had mastectomy in her 21's unknown reason  . Breast cancer Neg Hx     ASSESSMENT Recent Results: The most recent result is correlated with 16 mg per week: Lab Results  Component Value Date   INR 3.6 (A) 09/05/2018   INR 3.4 (A) 08/29/2018   INR 2.5 08/01/2018    Anticoagulation Dosing: Description   Take 0.5 tablet of your 4mg  blue colored warfarin tablets by mouth once-daily.     INR today: Supratherapeutic  PLAN Weekly dose was decreased by 12.5% to 14 mg per week  Patient Instructions  Patient instructed to take medications as defined in the Anti-coagulation Track section of this encounter.  Patient states already took today's dose of 2mg .  Patient instructed to take 0.5 tablet of your 4mg  blue colored warfarin tablets by mouth once-daily. Patient verbalized understanding of these instructions.    Patient advised to contact clinic or seek medical attention if signs/symptoms of bleeding or thromboembolism occur.  Patient verbalized understanding by repeating back information and was advised to contact me if further medication-related questions arise. Patient was also provided an information handout.  Follow-up Return in about 1 week (around 09/12/2018) for INR f/u @0900 .  Juanell Fairly, PharmD PGY1 Pharmacy Resident Phone [phone] 09/05/2018 8:58 AM   15 minutes spent face-to-face with the patient during the encounter. 50% of time spent on education, including signs/sx bleeding and  clotting, as well as food and drug interactions with warfarin. 50% of time was spent on fingerprick POC INR sample collection,processing, results determination, and documentation in [email or web address]

## 2018-09-05 NOTE — Patient Instructions (Signed)
Patient instructed to take medications as defined in the Anti-coagulation Track section of this encounter.  Patient states already took today's dose of 2mg .  Patient instructed to take 0.5 tablet of your 4mg  blue colored warfarin tablets by mouth once-daily. Patient verbalized understanding of these instructions.

## 2018-09-05 NOTE — Progress Notes (Signed)
INTERNAL MEDICINE TEACHING ATTENDING ADDENDUM - Bianca Vester M.D  Duration- indefinite, Indication- recurrent DVT, Factor II mutations, INR- supratherapeutic. Agree with pharmacy recommendations as outlined in their note.

## 2018-09-12 ENCOUNTER — Ambulatory Visit (INDEPENDENT_AMBULATORY_CARE_PROVIDER_SITE_OTHER): Payer: Medicare Other

## 2018-09-12 ENCOUNTER — Other Ambulatory Visit: Payer: Self-pay | Admitting: Internal Medicine

## 2018-09-12 DIAGNOSIS — Z5181 Encounter for therapeutic drug level monitoring: Secondary | ICD-10-CM | POA: Diagnosis not present

## 2018-09-12 DIAGNOSIS — D689 Coagulation defect, unspecified: Secondary | ICD-10-CM

## 2018-09-12 DIAGNOSIS — Z86718 Personal history of other venous thrombosis and embolism: Secondary | ICD-10-CM | POA: Diagnosis not present

## 2018-09-12 DIAGNOSIS — Z7901 Long term (current) use of anticoagulants: Secondary | ICD-10-CM | POA: Diagnosis not present

## 2018-09-12 LAB — POCT INR: INR: 2.9 (ref 2.0–3.0)

## 2018-09-12 NOTE — Patient Instructions (Signed)
Patient instructed to take medications as defined in the Anti-coagulation Track section of this encounter.  Patient instructed to hold tomorrow's dose (Tuesday, 09/13/18), then resume regimen as follows: Take 0.5 tablet of your 4mg  blue colored warfarin tablets by mouth once-daily. Patient verbalized understanding of these instructions.

## 2018-09-12 NOTE — Progress Notes (Signed)
Anticoagulation Management Pamela [name] is a 73 y.o. female who reports to the clinic for monitoring of warfarin treatment.    Indication: Clotting disorder, Hx of DVTs, Long term anticoagulant use  Duration: indefinite Supervising physician: Lenice Pressman  Anticoagulation Clinic Visit History: Patient does not report signs/symptoms of bleeding or thromboembolism  Other recent changes: No diet, medications, lifestyle changes endorsed by patient Anticoagulation Episode Summary    Current INR goal:   2.0-3.0  TTR:   72.1 % (8 y)  Next INR check:   10/03/2018  INR from last check:   2.9 (09/12/2018)  Weekly max warfarin dose:     Target end date:   Indefinite  INR check location:   Anticoagulation Clinic  Preferred lab:     Send INR reminders to:   [facility]   Indications   Clotting disorder ([employer]) [D68.9] DVT HX OF (Resolved) [B71.696]       Comments:         Anticoagulation Care Providers    Provider Role Specialty Phone number   Burman Freestone, MD  Internal Medicine [phone]      Allergies  Allergen Reactions  . Ace Inhibitors Cough   Prior to Admission medications   Medication Sig Start Date End Date Taking? Authorizing Provider  colestipol (COLESTID) 1 g tablet TAKE 6 TABLET BY MOUTH QD 02/24/18   Bartholomew Crews, MD  fluticasone [facility]) 50 MCG/ACT nasal spray Place 2 sprays into both nostrils daily. 07/19/17   Ok Edwards, PA-C  glucosamine-chondroitin 500-400 MG tablet Take 1 tablet by mouth 3 (three) times daily.    [provider]  levothyroxine (SYNTHROID, LEVOTHROID) 88 MCG tablet TAKE ONE (1) TABLET BY MOUTH EVERY DAY 02/03/18   Bartholomew Crews, MD  losartan (COZAAR) 25 MG tablet TAKE ONE (1) TABLET BY MOUTH EVERY DAY 12/01/17   Bartholomew Crews, MD  Multiple Vitamins-Calcium (DAILY COMBO MULTIVITS/CALCIUM) TABS Take 1 tablet by mouth daily.      [provider]  pantoprazole (PROTONIX) 40 MG tablet TAKE ONE (1) TABLET  BY MOUTH EVERY DAY 09/20/17   Bartholomew Crews, MD  warfarin (COUMADIN) 4 MG tablet Take one tablet on Mon / Wed / Fri. Take one-half tablet all other days. Dose will change frequently. 11/02/16   Bartholomew Crews, MD  warfarin (COUMADIN) 4 MG tablet TAKE 1 TABLET BY MOUTH ON MONDAYS AND FRIDAYS.  TAKE 1/2 A TABLET BY MOUTH ALL OTHER DAYS. 08/05/17   Bartholomew Crews, MD   Past Medical History:  Diagnosis Date  . CHOLELITHIASIS, WITH OBSTRUCTION 04/21/2006   s/p ERCP,sprincterotomy, stent ([facility])  . 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 GLAUCOMA 04/24/2008  . HYPERLIPIDEMIA 06/03/2006  . Hypothyroidism lifelong  . OBESITY, MILD 04/24/2008  . Tenosynovitis 01/2004   [place]   Social History   Socioeconomic History  . Marital status: Widowed    Spouse name: Not on file  . Number of children: Not on file  . Years of education: Not on file  . Highest education level: Not on file  Occupational History  . 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 Financial resource strain: Not on file  . Food insecurity:    Worry: Not on file    Inability: Not on file  . Transportation needs:    Medical: Not on file  Non-medical: Not on file  Tobacco Use  . Smoking status: Former Smoker    Packs/day: 1.00    Years: 20.00    Pack years: 20.00    Last attempt to quit: 07/06/1985    Years since quitting: 33.2  . Tobacco comment: Quit 1988  Substance and Sexual Activity  . Alcohol use: Yes    Comment: 2-3 glasses of wine daily.  . Drug use: No  . Sexual activity: Not on file  Lifestyle  . Physical activity:    Days per week: Not on file    Minutes per session: Not on file  . Stress: Not on file  Relationships  . Social connections:    Talks on phone: Not on file    Gets together: Not on file    Attends religious service: Not on file    Active member of club or  organization: Not on file    Attends meetings of clubs or organizations: Not on file    Relationship status: Not on file  Other Topics Concern  . Not on file  Social History Narrative   Married, no regular exercise.   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    Family History  Problem Relation Age of Onset  . Cancer Mother        H&N, smoker  . Cancer Sister        lung, 2011, smoker  . Other Other        grandmother had mastectomy in her 53's unknown reason  . Breast cancer Neg Hx     ASSESSMENT Recent Results: The most recent result is correlated with 14 mg per week: Lab Results  Component Value Date   INR 2.9 09/12/2018   INR 3.6 (A) 09/05/2018   INR 3.4 (A) 08/29/2018    Anticoagulation Dosing: Description   Take 0.5 tablet of your 4mg  blue colored warfarin tablets by mouth once-daily.     INR today: Therapeutic  PLAN Weekly dose was unchanged.  Patient Instructions  Patient instructed to take medications as defined in the Anti-coagulation Track section of this encounter.  Patient instructed to hold tomorrow's dose (Tuesday, 09/13/18), then resume regimen as follows: Take 0.5 tablet of your 4mg  blue colored warfarin tablets by mouth once-daily. Patient verbalized understanding of these instructions.      Patient advised to contact clinic or seek medical attention if signs/symptoms of bleeding or thromboembolism occur.  Patient verbalized understanding by repeating back information and was advised to contact me if further medication-related questions arise. Patient was also provided an information handout.  Follow-up Return in about 3 weeks (around 10/03/2018), or INR Follow up.  Harrietta Guardian, PharmD PGY1 Pharmacy Resident 09/12/2018    10:31 AM Please check AMION for all [place] numbers  15 minutes spent face-to-face with the patient during the encounter. 50% of time  spent on education, including signs/sx bleeding and clotting, as well as food and drug interactions with warfarin. 50% of time was spent on fingerprick POC INR sample collection,processing, results determination, and documentation in [email or web address]/.

## 2018-09-12 NOTE — Progress Notes (Signed)
INTERNAL MEDICINE TEACHING ATTENDING ADDENDUM  I agree with pharmacy recommendations as outlined in their note.   Alexander N Raines, MD  

## 2018-09-21 ENCOUNTER — Other Ambulatory Visit: Payer: Self-pay | Admitting: Pharmacist

## 2018-09-21 ENCOUNTER — Encounter: Payer: Self-pay | Admitting: Pharmacist

## 2018-09-21 DIAGNOSIS — Z7901 Long term (current) use of anticoagulants: Secondary | ICD-10-CM

## 2018-09-21 DIAGNOSIS — D689 Coagulation defect, unspecified: Secondary | ICD-10-CM

## 2018-09-23 ENCOUNTER — Other Ambulatory Visit: Payer: Self-pay | Admitting: *Deleted

## 2018-09-23 DIAGNOSIS — Z7901 Long term (current) use of anticoagulants: Secondary | ICD-10-CM

## 2018-09-23 DIAGNOSIS — D689 Coagulation defect, unspecified: Secondary | ICD-10-CM

## 2018-10-03 ENCOUNTER — Ambulatory Visit: Payer: Medicare Other

## 2018-10-04 ENCOUNTER — Telehealth: Payer: Self-pay | Admitting: Pharmacist

## 2018-10-04 ENCOUNTER — Other Ambulatory Visit: Payer: Self-pay | Admitting: *Deleted

## 2018-10-04 DIAGNOSIS — D689 Coagulation defect, unspecified: Secondary | ICD-10-CM

## 2018-10-04 DIAGNOSIS — Z7901 Long term (current) use of anticoagulants: Secondary | ICD-10-CM

## 2018-10-04 LAB — PROTIME-INR
INR: 2.8 — ABNORMAL HIGH (ref 0.8–1.2)
Prothrombin Time: 28 s — ABNORMAL HIGH (ref 9.1–12.0)

## 2018-10-04 NOTE — Telephone Encounter (Signed)
Patient has been called and apprised of her INR value 2.80 collected at [facility] on 03-Oct-2018 and instructed to continue same regimen of warfarin, I.e. take only 1/2 x 4mg  (2mg  dose) PO QD until next INR on 31-Oct-2018. She denies any signs or symptoms of bleeding or embolic events. Still has plenty of warfarin she states.

## 2018-10-04 NOTE — Telephone Encounter (Signed)
Patient normally seen in [facility] which was suspended by order of the Infection Control Officer (Physician) of the Hospital due to the COVID-19 pandemic. INR was collected outside of hospital at [facility]. In therapeutic range. Will contact the patient to apprise her of the same and to instruct her to continue the same warfarin regimen.

## 2018-10-31 LAB — PROTIME-INR
INR: 3.3 — ABNORMAL HIGH (ref 0.8–1.2)
Prothrombin Time: 33.4 s — ABNORMAL HIGH (ref 9.1–12.0)

## 2018-11-01 ENCOUNTER — Other Ambulatory Visit: Payer: Self-pay | Admitting: *Deleted

## 2018-11-01 ENCOUNTER — Telehealth: Payer: Self-pay | Admitting: Pharmacist

## 2018-11-01 DIAGNOSIS — Z7901 Long term (current) use of anticoagulants: Secondary | ICD-10-CM

## 2018-11-01 DIAGNOSIS — D689 Coagulation defect, unspecified: Secondary | ICD-10-CM

## 2018-11-01 MED FILL — COLESTIPOL HCL 1 GM TABLET: 1 | 30 days supply | Qty: 180 | Fill #0

## 2018-11-01 MED FILL — LEVOTHYROXINE 88 MCG TABLET: 88 | 90 days supply | Qty: 90 | Fill #0

## 2018-11-01 NOTE — Telephone Encounter (Signed)
Advised patient to OMIT ONE (1) day's dosing, resume taking 1/2 x 4mg  (2mg  dose) daily thereafter. Repeat INR at [facility] week of 28-Nov-2018. She denies any new medications, no extra-doses, no decrease in dark-green leafy vegetables and no febrile illness. Denies bleeding.

## 2018-11-18 ENCOUNTER — Other Ambulatory Visit: Payer: Self-pay | Admitting: Internal Medicine

## 2018-11-18 MED FILL — LOSARTAN POTASSIUM 25 MG TA: 25 | 30 days supply | Qty: 30 | Fill #0

## 2018-11-22 ENCOUNTER — Encounter (HOSPITAL_COMMUNITY): Payer: Self-pay

## 2018-11-22 ENCOUNTER — Emergency Department (HOSPITAL_COMMUNITY)
Admission: EM | Admit: 2018-11-22 | Discharge: 2018-11-22 | Disposition: A | Discharge status: Home / Self Care | Payer: Medicare Other | Attending: Emergency Medicine | Admitting: Emergency Medicine

## 2018-11-22 ENCOUNTER — Other Ambulatory Visit: Payer: Self-pay

## 2018-11-22 DIAGNOSIS — Z79899 Other long term (current) drug therapy: Secondary | ICD-10-CM | POA: Diagnosis not present

## 2018-11-22 DIAGNOSIS — E039 Hypothyroidism, unspecified: Secondary | ICD-10-CM | POA: Insufficient documentation

## 2018-11-22 DIAGNOSIS — I10 Essential (primary) hypertension: Secondary | ICD-10-CM | POA: Insufficient documentation

## 2018-11-22 DIAGNOSIS — Z87891 Personal history of nicotine dependence: Secondary | ICD-10-CM | POA: Insufficient documentation

## 2018-11-22 DIAGNOSIS — R55 Syncope and collapse: Secondary | ICD-10-CM | POA: Diagnosis not present

## 2018-11-22 DIAGNOSIS — R040 Epistaxis: Secondary | ICD-10-CM | POA: Diagnosis not present

## 2018-11-22 DIAGNOSIS — Z7901 Long term (current) use of anticoagulants: Secondary | ICD-10-CM

## 2018-11-22 LAB — CBC
HCT: 39.8 % (ref 36.0–46.0)
Hemoglobin: 12.7 g/dL (ref 12.0–15.0)
MCH: 33.2 pg (ref 26.0–34.0)
MCHC: 31.9 g/dL (ref 30.0–36.0)
MCV: 103.9 fL — ABNORMAL HIGH (ref 80.0–100.0)
Platelets: 379 10*3/uL (ref 150–400)
RBC: 3.83 MIL/uL — ABNORMAL LOW (ref 3.87–5.11)
RDW: 12.5 % (ref 11.5–15.5)
WBC: 15.6 10*3/uL — ABNORMAL HIGH (ref 4.0–10.5)
nRBC: 0 % (ref 0.0–0.2)

## 2018-11-22 LAB — BASIC METABOLIC PANEL
Anion gap: 16 — ABNORMAL HIGH (ref 5–15)
BUN: 21 mg/dL (ref 8–23)
CO2: 20 mmol/L — ABNORMAL LOW (ref 22–32)
Calcium: 9.5 mg/dL (ref 8.9–10.3)
Chloride: 102 mmol/L (ref 98–111)
Creatinine, Ser: 1.02 mg/dL — ABNORMAL HIGH (ref 0.44–1.00)
GFR calc Af Amer: >60 mL/min (ref >60)
GFR calc non Af Amer: 54 mL/min — ABNORMAL LOW (ref >60)
Glucose, Bld: 138 mg/dL — ABNORMAL HIGH (ref 70–99)
Potassium: 3.9 mmol/L (ref 3.5–5.1)
Sodium: 138 mmol/L (ref 135–145)

## 2018-11-22 LAB — TYPE AND SCREEN
ABO/RH(D): A POS
Antibody Screen: NEGATIVE

## 2018-11-22 LAB — PROTIME-INR
INR: 2.3 — ABNORMAL HIGH (ref 0.8–1.2)
Prothrombin Time: 25.1 seconds — ABNORMAL HIGH (ref 11.4–15.2)

## 2018-11-22 LAB — TROPONIN I: Troponin I: <0.03 ng/mL (ref <0.03)

## 2018-11-22 MED ORDER — OXYMETAZOLINE HCL 0.05 % NA SOLN
1.0000 | Freq: Once | NASAL | Status: AC
  Administered 2018-11-22: 1 via NASAL
  Filled 2018-11-22: qty 30

## 2018-11-22 NOTE — ED Notes (Signed)
Pt continues to have bleeding from left nare.  Pt denies any pain or discomfort.  Skin warm and dry, color appropriate

## 2018-11-22 NOTE — ED Triage Notes (Signed)
Pt arrives POV for eval of nosebleed that started around 1600 this afternoon. Pt reports she is anticoagulated on warfarin. Has tried to apply pressure to stop the bleeding but it has been continuous since 1600. Pt is initially A&Ox4 in triage.

## 2018-11-22 NOTE — ED Provider Notes (Addendum)
[facility] EMERGENCY DEPARTMENT Provider Note   CSN: [id number] Arrival date & time: 11/22/18  2045    History   Chief Complaint Chief Complaint  Patient presents with  . Epistaxis    HPI Pamela [name] is a 73 y.o. female.     HPI Patient reports that she had nosebleeding on Friday.  It stopped after about 15 minutes of compression.  She reports that she was okay over the weekend and today it started bleeding again at about 4 PM.  She reports it continued to bleed despite pressure.  No associated pain.  Patient does take Coumadin for hypercoagulable clotting disorder factor II deficiency with history of DVT.  Patient reports she has not had a clot in a very long time.  She has not been having problems with extremity swelling, shortness of breath, chest pain.  She has not had any fevers chills or cough.  Reports she is felt well and been doing normal activities without difficulty.  Reports everything was going well until she started her nose bleeding.  Patient drove to the emergency department without difficulty. Past Medical History:  Diagnosis Date  . CHOLELITHIASIS, WITH OBSTRUCTION 04/21/2006   s/p ERCP,sprincterotomy, stent ([facility])  . 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 GLAUCOMA 04/24/2008  . HYPERLIPIDEMIA 06/03/2006  . Hypothyroidism lifelong  . OBESITY, MILD 04/24/2008  . Tenosynovitis 01/2004   Sypher    Patient Active Problem List   Diagnosis Date Noted  . Vitamin B 12 deficiency 03/01/2018  . History of gout 03/04/2015  . Healthcare maintenance 09/27/2013  . 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 GERD (gastroesophageal reflux disease) 03/03/2011  . Obesity (BMI 30.0-34.9) 04/24/2008  . GLAUCOMA 04/24/2008  . Osteopenia of the elderly 04/24/2008  . Essential hypertension, benign 04/24/2008  . Clotting disorder ([place]) 06/03/2006  . Hypothyroidism 04/21/2006    Past Surgical  History:  Procedure Laterality Date  . CHOLECYSTECTOMY     2004     OB History   No obstetric history on file.      Home Medications    Prior to Admission medications   Medication Sig Start Date End Date Taking? Authorizing Provider  colestipol (COLESTID) 1 g tablet TAKE 6 TABLET BY MOUTH QD 02/24/18   Bartholomew Crews, MD  fluticasone [facility]) 50 MCG/ACT nasal spray Place 2 sprays into both nostrils daily. 07/19/17   Ok Edwards, PA-C  glucosamine-chondroitin 500-400 MG tablet Take 1 tablet by mouth 3 (three) times daily.    [provider]  levothyroxine (SYNTHROID, LEVOTHROID) 88 MCG tablet TAKE ONE (1) TABLET BY MOUTH EVERY DAY 02/03/18   Bartholomew Crews, MD  losartan (COZAAR) 25 MG tablet TAKE ONE (1) TABLET BY MOUTH EVERY DAY 11/18/18   Bartholomew Crews, MD  Multiple Vitamins-Calcium (DAILY COMBO MULTIVITS/CALCIUM) TABS Take 1 tablet by mouth daily.      [provider]  pantoprazole (PROTONIX) 40 MG tablet TAKE ONE (1) TABLET BY MOUTH EVERY DAY 09/13/18   Bartholomew Crews, MD  warfarin (COUMADIN) 4 MG tablet Take one tablet on Mon / Wed / Fri. Take one-half tablet all other days. Dose will change frequently. 11/02/16   Bartholomew Crews, MD  warfarin (COUMADIN) 4 MG tablet TAKE 1 TABLET BY MOUTH ON MONDAYS AND FRIDAYS.  TAKE 1/2 A TABLET BY MOUTH ALL OTHER DAYS. 08/05/17   Bartholomew Crews, MD    Family History Family History  Problem Relation Age of Onset  . Cancer Mother        H&N, smoker  . Cancer Sister        lung, 2011, smoker  . Other Other        grandmother had mastectomy in her 42's unknown reason  . Breast cancer Neg Hx     Social History Social History   Tobacco Use  . Smoking status: Former Smoker    Packs/day: 1.00    Years: 20.00    Pack years: 20.00    Last attempt to quit: 07/06/1985    Years since quitting: 33.4  . Tobacco comment: Quit 1988  Substance Use Topics  . Alcohol use: Yes    Comment: 2-3 glasses of  wine daily.  . Drug use: No     Allergies   Ace inhibitors   Review of Systems Review of Systems 10 Systems reviewed and are negative for acute change except as noted in the HPI.   Physical Exam Updated Vital Signs BP (!) 141/79   Pulse 100   Temp (!) 97.5 F (36.4 C) (Oral) Comment: Pt was breathing through her mouth stating that she can't breathe through her nose.  Resp 17   SpO2 95%   Physical Exam Constitutional:      Appearance: Normal appearance.     Comments: Patient is well in appearance.  No acute distress.  She is holding her nose.  HENT:     Nose:     Comments: There is fresh red blood clot in the left nare.  There is some streaking blood on the posterior oropharynx.  Airway is widely patent.  No difficulty breathing.    Mouth/Throat:     Mouth: Mucous membranes are moist.  Eyes:     Extraocular Movements: Extraocular movements intact.  Neck:     Musculoskeletal: Neck supple.  Cardiovascular:     Rate and Rhythm: Regular rhythm.     Comments: Borderline tachycardia.  Regular no gross rub or gallop. Pulmonary:     Effort: Pulmonary effort is normal.     Breath sounds: Normal breath sounds.  Abdominal:     General: There is no distension.     Palpations: Abdomen is soft.     Tenderness: There is no abdominal tenderness. There is no guarding.  Musculoskeletal: Normal range of motion.        General: No swelling or tenderness.     Right lower leg: No edema.     Left lower leg: No edema.  Skin:    General: Skin is warm and dry.  Neurological:     General: No focal deficit present.     Mental Status: She is alert and oriented to person, place, and time.     Coordination: Coordination normal.  Psychiatric:        Mood and Affect: Mood normal.      ED Treatments / Results  Labs (all labs ordered are listed, but only abnormal results are displayed) Labs Reviewed  BASIC METABOLIC PANEL - Abnormal; Notable for the following components:      Result  Value   CO2 20 (*)    Glucose, Bld 138 (*)    Creatinine, Ser 1.02 (*)    GFR calc non Af Amer 54 (*)    Anion gap 16 (*)    All other components within normal limits  CBC - Abnormal; Notable for the following components:   WBC 15.6 (*)    RBC 3.83 (*)  MCV 103.9 (*)    All other components within normal limits  PROTIME-INR - Abnormal; Notable for the following components:   Prothrombin Time 25.1 (*)    INR 2.3 (*)    All other components within normal limits  TROPONIN I  TYPE AND SCREEN  ABO/RH    EKG EKG Interpretation  Date/Time:  Tuesday Nov 22 2018 21:39:15 EDT Ventricular Rate:  109 PR Interval:    QRS Duration: 130 QT Interval:  350 QTC Calculation: 472 R Axis:   72 Text Interpretation:  Sinus tachycardia Right bundle branch block Abnormal inferior Q waves agree. RBBB new since 2007. Confirmed by Charlesetta Shanks [phone]) on 11/22/2018 11:36:16 PM   Radiology No results found.  Procedures .Epistaxis Management Date/Time: 11/22/2018 11:36 PM Performed by: Charlesetta Shanks, MD Authorized by: Charlesetta Shanks, MD   Consent:    Consent obtained:  Verbal   Consent given by:  Patient   Risks discussed:  Bleeding, infection, nasal injury and pain Procedure details:    Treatment site:  L posterior   Treatment method:  Nasal balloon   Treatment complexity:  Extensive   Treatment episode: initial   Post-procedure details:    Assessment:  Bleeding stopped   Patient tolerance of procedure:  Tolerated well, no immediate complications Comments:     See metaxalone placed on cotton pledget and placed in the nare for about 5 minutes.  This was removed.  Patient's nose was cleared of all clot.  Once clot was cleared, patient was not actively bleeding.  I could visualize nasal passage appropriately.  Rapid Rhino 7.5 cm balloon placed.  This was inflated to 3 cc.  Patient tolerated this well.  She was rechecked after 1 hour with no rebleeding.   (including critical care time)   Medications Ordered in ED Medications  oxymetazoline (AFRIN) 0.05 % nasal spray 1 spray (1 spray Each Nare Given 11/22/18 2137)     Initial Impression / Assessment and Plan / ED Course  I have reviewed the triage vital signs and the nursing notes.  Pertinent labs & imaging results that were available during my care of the patient were reviewed by me and considered in my medical decision making (see chart for details).       Patient presents with nasal bleeding.  She is anticoagulated on Coumadin for her to deficiency with hypercoagulable state.  She has been on this for long-term.  He is therapeutic but she had been decreasing her dose slightly to being somewhat for therapeutic over the past several days at a low greater than 3.  Patient denies any other associated symptoms.  She did have a vasovagal episode in triage.  Briefly became hypotensive and pale but maintained pulses and immediately responded and was back to normal.  Denied any chest pain with the episode and no shortness of breath.  At the time of my evaluation, patient's blood pressures were normal, mental status clear and she was denying any associated symptoms.  Because of this however I did check labs and EKG.  No sign of significant anemia.  Patient is therapeutic on her INR.  Troponin negative.  Patient does have right bundle branch block which is new but this is in comparison to EKG from 2007.  His systems does not suggest any anginal or ischemic symptoms.  Patient denies any problems with exertional shortness of breath or chest pain.  She is counseled to follow-up with her PCP this week to discuss further evaluation and to see if  comparison EKGs are available.  Return precautions are reviewed.  Patient is advised she needs the nasal tampon removed within 2 to 3 days.  She is counseled on returning if she develops any concerning symptoms of lightheadedness, shortness of breath, chest pain etc.  Final Clinical Impressions(s) / ED  Diagnoses   Final diagnoses:  Epistaxis  Anticoagulated  Vasovagal near syncope    ED Discharge Orders    None       Charlesetta Shanks, MD 11/22/18 2340    Charlesetta Shanks, MD 11/22/18 2341

## 2018-11-22 NOTE — ED Notes (Signed)
Dr. Johnney Killian at bedside to pack nostril  Pt tolerating well.

## 2018-11-22 NOTE — ED Triage Notes (Signed)
During triage process, pt became unresponsive and had syncopal episode. Pt w/ pulses the entire time, BP reading 65/45. Pt was briefly unconscious for approx 30-45 seconds and then was acutely responsive to sternal rub. Pt reported "I was just tired". Pt placed in trendelenburg and moved to Tra C for further eval.

## 2018-11-22 NOTE — ED Notes (Signed)
Pt resting quietly at this time.  No active bleeding noted at this time

## 2018-11-22 NOTE — Discharge Instructions (Addendum)
1.  Your packing needs to be removed within 2 to 3 days. 2.  Your INR is 2.3. 3.  Your EKG has changed since your tracing in 2007.  Heart labs are normal today.  You may however need further evaluation.  Return immediately to the emergency department if you develop any problems with chest pain or shortness of breath.

## 2018-11-22 NOTE — ED Notes (Signed)
Patient verbalizes understanding of discharge instructions. Opportunity for questioning and answers were provided. Armband removed by staff, pt discharged from ED.  

## 2018-11-23 ENCOUNTER — Encounter: Payer: Self-pay | Admitting: Internal Medicine

## 2018-11-23 LAB — ABO/RH: ABO/RH(D): A POS

## 2018-11-24 ENCOUNTER — Emergency Department (HOSPITAL_COMMUNITY)
Admission: EM | Admit: 2018-11-24 | Discharge: 2018-11-24 | Disposition: A | Discharge status: Home / Self Care | Payer: Medicare Other | Attending: Emergency Medicine | Admitting: Emergency Medicine

## 2018-11-24 ENCOUNTER — Encounter (HOSPITAL_COMMUNITY): Payer: Self-pay

## 2018-11-24 ENCOUNTER — Other Ambulatory Visit: Payer: Self-pay

## 2018-11-24 DIAGNOSIS — D682 Hereditary deficiency of other clotting factors: Secondary | ICD-10-CM | POA: Diagnosis not present

## 2018-11-24 DIAGNOSIS — I10 Essential (primary) hypertension: Secondary | ICD-10-CM | POA: Diagnosis not present

## 2018-11-24 DIAGNOSIS — Z7901 Long term (current) use of anticoagulants: Secondary | ICD-10-CM | POA: Diagnosis not present

## 2018-11-24 DIAGNOSIS — E039 Hypothyroidism, unspecified: Secondary | ICD-10-CM | POA: Insufficient documentation

## 2018-11-24 DIAGNOSIS — R040 Epistaxis: Secondary | ICD-10-CM | POA: Diagnosis not present

## 2018-11-24 DIAGNOSIS — Z09 Encounter for follow-up examination after completed treatment for conditions other than malignant neoplasm: Secondary | ICD-10-CM | POA: Diagnosis present

## 2018-11-24 MED ORDER — CEPHALEXIN 500 MG PO CAPS: 500.0000 mg | ORAL_CAPSULE | Freq: Four times a day (QID) | ORAL | 0 refills | Status: DC

## 2018-11-24 NOTE — ED Provider Notes (Signed)
[facility] EMERGENCY DEPARTMENT Provider Note   CSN: [id number] Arrival date & time: 11/24/18  1011    History   Chief Complaint Chief Complaint  Patient presents with  . Epistaxis    HPI Pamela [name] is a 73 y.o. female.     The history is provided by the patient and medical records. No language interpreter was used.  Epistaxis  Associated symptoms: no fever      73 year old female with history of factor II deficiency currently on warfarin who had a recent nosebleed several days ago and had a Rhino Rocket placed 2 days ago in her L nare, she is here to have it removed per recommendation.  She denies any significant pain however this morning she did notice some small amount of blood oozing around the site saying that it was blood-tinged.  No complaint of headache or fever.  Her last INR was 2.3.  Past Medical History:  Diagnosis Date  . CHOLELITHIASIS, WITH OBSTRUCTION 04/21/2006   s/p ERCP,sprincterotomy, stent ([facility])  . 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 GLAUCOMA 04/24/2008  . HYPERLIPIDEMIA 06/03/2006  . Hypothyroidism lifelong  . OBESITY, MILD 04/24/2008  . Tenosynovitis 01/2004   Sypher    Patient Active Problem List   Diagnosis Date Noted  . Vitamin B 12 deficiency 03/01/2018  . History of gout 03/04/2015  . Healthcare maintenance 09/27/2013  . 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 GERD (gastroesophageal reflux disease) 03/03/2011  . Obesity (BMI 30.0-34.9) 04/24/2008  . GLAUCOMA 04/24/2008  . Osteopenia of the elderly 04/24/2008  . Essential hypertension, benign 04/24/2008  . Clotting disorder ([place]) 06/03/2006  . Hypothyroidism 04/21/2006    Past Surgical History:  Procedure Laterality Date  . CHOLECYSTECTOMY     2004     OB History   No obstetric history on file.      Home Medications    Prior to Admission medications   Medication Sig Start Date End Date  Taking? Authorizing Provider  colestipol (COLESTID) 1 g tablet TAKE 6 TABLET BY MOUTH QD 02/24/18   Bartholomew Crews, MD  fluticasone [facility]) 50 MCG/ACT nasal spray Place 2 sprays into both nostrils daily. 07/19/17   Ok Edwards, PA-C  glucosamine-chondroitin 500-400 MG tablet Take 1 tablet by mouth 3 (three) times daily.    [provider]  levothyroxine (SYNTHROID, LEVOTHROID) 88 MCG tablet TAKE ONE (1) TABLET BY MOUTH EVERY DAY 02/03/18   Bartholomew Crews, MD  losartan (COZAAR) 25 MG tablet TAKE ONE (1) TABLET BY MOUTH EVERY DAY 11/18/18   Bartholomew Crews, MD  Multiple Vitamins-Calcium (DAILY COMBO MULTIVITS/CALCIUM) TABS Take 1 tablet by mouth daily.      [provider]  pantoprazole (PROTONIX) 40 MG tablet TAKE ONE (1) TABLET BY MOUTH EVERY DAY 09/13/18   Bartholomew Crews, MD  warfarin (COUMADIN) 4 MG tablet Take one tablet on Mon / Wed / Fri. Take one-half tablet all other days. Dose will change frequently. 11/02/16   Bartholomew Crews, MD  warfarin (COUMADIN) 4 MG tablet TAKE 1 TABLET BY MOUTH ON MONDAYS AND FRIDAYS.  TAKE 1/2 A TABLET BY MOUTH ALL OTHER DAYS. 08/05/17   Bartholomew Crews, MD    Family History Family History  Problem Relation Age of Onset  . Cancer Mother        H&N, smoker  . Cancer Sister        lung, 2011,  smoker  . Other Other        grandmother had mastectomy in her 3's unknown reason  . Breast cancer Neg Hx     Social History Social History   Tobacco Use  . Smoking status: Former Smoker    Packs/day: 1.00    Years: 20.00    Pack years: 20.00    Last attempt to quit: 07/06/1985    Years since quitting: 33.4  . Tobacco comment: Quit 1988  Substance Use Topics  . Alcohol use: Yes    Comment: 2-3 glasses of wine daily.  . Drug use: No     Allergies   Ace inhibitors   Review of Systems Review of Systems  Constitutional: Negative for fever.  HENT: Positive for nosebleeds.      Physical Exam Updated Vital  Signs BP 137/84 (BP Location: Right Arm)   Pulse 95   Temp 99.4 F (37.4 C) (Oral)   Resp 16   SpO2 94%   Physical Exam Vitals signs and nursing note reviewed.  Constitutional:      General: She is not in acute distress.    Appearance: She is well-developed.  HENT:     Head: Atraumatic.     Nose:     Comments: Rhino Rocket tampon is in left nares.  Small amount of dry blood noted noted to the surrounding region.  It is nontender.  No bleeding from right nares Eyes:     Conjunctiva/sclera: Conjunctivae normal.  Neck:     Musculoskeletal: Neck supple.  Skin:    Findings: No rash.  Neurological:     Mental Status: She is alert.      ED Treatments / Results  Labs (all labs ordered are listed, but only abnormal results are displayed) Labs Reviewed - No data to display  EKG None  Radiology No results found.  Procedures Procedures (including critical care time)  Medications Ordered in ED Medications - No data to display   Initial Impression / Assessment and Plan / ED Course  I have reviewed the triage vital signs and the nursing notes.  Pertinent labs & imaging results that were available during my care of the patient were reviewed by me and considered in my medical decision making (see chart for details).       BP 137/84 (BP Location: Right Arm)   Pulse 95   Temp 99.4 F (37.4 C) (Oral)   Resp 16   SpO2 94%    Final Clinical Impressions(s) / ED Diagnoses   Final diagnoses:  Left-sided epistaxis    ED Discharge Orders         Ordered    cephALEXin (KEFLEX) 500 MG capsule  4 times daily     11/24/18 1031         10:28 AM Patient with recent nosebleed, history of factor II deficiency currently on warfarin.  She had nosebleed several days ago, was seen in the ED 2 days ago and had a Rhino Rocket placed in her left nares.  She is here to have it removed per recommendation however she did mention noticing some small amount of blood she tends discharge  coming from her left nares and this morning.  We discussed option of removing it versus keeping it for several more days before removal.  Patient agreeable with keeping the Rhino Rocket in place for 2 more days.  She will follow-up with her PCP or ENT returning here for reassessment.  Care discussed with Dr. Francia Greaves.  Domenic Moras, PA-C 11/24/18 1045    Valarie Merino, MD 11/25/18 [phone]

## 2018-11-24 NOTE — ED Notes (Signed)
Provider decided too soon to remove nasal packing.

## 2018-11-24 NOTE — Discharge Instructions (Signed)
Please keep the rhino rocket in for the next few days.  Please call and follow up with ENT specialist on Monday to have it remove.  IN case if it rebleed, ENT specialist may be able to identify the site of the bleed and manage it appropriately.  Take antibiotic to prevent potential infection. Return if you have any concerns.

## 2018-11-24 NOTE — ED Triage Notes (Signed)
Removal of nasal device

## 2018-11-26 ENCOUNTER — Ambulatory Visit (HOSPITAL_COMMUNITY)
Admission: EM | Admit: 2018-11-26 | Discharge: 2018-11-26 | Disposition: A | Discharge status: Home / Self Care | Payer: Medicare Other

## 2018-11-26 ENCOUNTER — Encounter (HOSPITAL_COMMUNITY): Payer: Self-pay

## 2018-11-26 ENCOUNTER — Other Ambulatory Visit: Payer: Self-pay

## 2018-11-26 DIAGNOSIS — Z4800 Encounter for change or removal of nonsurgical wound dressing: Secondary | ICD-10-CM | POA: Diagnosis not present

## 2018-11-26 NOTE — Discharge Instructions (Signed)
Return if any problems.

## 2018-11-26 NOTE — ED Provider Notes (Signed)
[facility]    CSN: [id number] Arrival date & time: 11/26/18  1449     History   Chief Complaint Chief Complaint  Patient presents with  . rhino rocket  removed    HPI Pamela [name] is a 73 y.o. female.   The history is provided by the patient. No language interpreter was used.    Past Medical History:  Diagnosis Date  . CHOLELITHIASIS, WITH OBSTRUCTION 04/21/2006   s/p ERCP,sprincterotomy, stent ([facility])  . 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 GLAUCOMA 04/24/2008  . HYPERLIPIDEMIA 06/03/2006  . Hypothyroidism lifelong  . OBESITY, MILD 04/24/2008  . Tenosynovitis 01/2004   Sypher    Patient Active Problem List   Diagnosis Date Noted  . Vitamin B 12 deficiency 03/01/2018  . History of gout 03/04/2015  . Healthcare maintenance 09/27/2013  . 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 GERD (gastroesophageal reflux disease) 03/03/2011  . Obesity (BMI 30.0-34.9) 04/24/2008  . GLAUCOMA 04/24/2008  . Osteopenia of the elderly 04/24/2008  . Essential hypertension, benign 04/24/2008  . Clotting disorder ([place]) 06/03/2006  . Hypothyroidism 04/21/2006    Past Surgical History:  Procedure Laterality Date  . CHOLECYSTECTOMY     2004    OB History   No obstetric history on file.      Home Medications    Prior to Admission medications   Medication Sig Start Date End Date Taking? Authorizing Provider  cephALEXin (KEFLEX) 500 MG capsule Take 1 capsule (500 mg total) by mouth 4 (four) times daily. 11/24/18   Domenic Moras, PA-C  colestipol (COLESTID) 1 g tablet TAKE 6 TABLET BY MOUTH QD 02/24/18   Bartholomew Crews, MD  fluticasone [facility]) 50 MCG/ACT nasal spray Place 2 sprays into both nostrils daily. 07/19/17   Ok Edwards, PA-C  glucosamine-chondroitin 500-400 MG tablet Take 1 tablet by mouth 3 (three) times daily.    [provider]  levothyroxine (SYNTHROID, LEVOTHROID) 88 MCG tablet TAKE  ONE (1) TABLET BY MOUTH EVERY DAY 02/03/18   Bartholomew Crews, MD  losartan (COZAAR) 25 MG tablet TAKE ONE (1) TABLET BY MOUTH EVERY DAY 11/18/18   Bartholomew Crews, MD  Multiple Vitamins-Calcium (DAILY COMBO MULTIVITS/CALCIUM) TABS Take 1 tablet by mouth daily.      [provider]  pantoprazole (PROTONIX) 40 MG tablet TAKE ONE (1) TABLET BY MOUTH EVERY DAY 09/13/18   Bartholomew Crews, MD  warfarin (COUMADIN) 4 MG tablet Take one tablet on Mon / Wed / Fri. Take one-half tablet all other days. Dose will change frequently. 11/02/16   Bartholomew Crews, MD  warfarin (COUMADIN) 4 MG tablet TAKE 1 TABLET BY MOUTH ON MONDAYS AND FRIDAYS.  TAKE 1/2 A TABLET BY MOUTH ALL OTHER DAYS. 08/05/17   Bartholomew Crews, MD    Family History Family History  Problem Relation Age of Onset  . Cancer Mother        H&N, smoker  . Cancer Sister        lung, 2011, smoker  . Other Other        grandmother had mastectomy in her 48's unknown reason  . Breast cancer Neg Hx     Social History Social History   Tobacco Use  . Smoking status: Former Smoker    Packs/day: 1.00    Years: 20.00    Pack years: 20.00    Last attempt to quit: 07/06/1985    Years  since quitting: 33.4  . Smokeless tobacco: Former Systems developer  . Tobacco comment: Quit 1988  Substance Use Topics  . Alcohol use: Yes    Comment: 2-3 glasses of wine daily.  . Drug use: No     Allergies   Ace inhibitors   Review of Systems Review of Systems  All other systems reviewed and are negative.    Physical Exam Triage Vital Signs ED Triage Vitals [11/26/18 1509]  Enc Vitals Group     BP (!) 150/77     Pulse Rate 98     Resp 16     Temp 99.1 F (37.3 C)     Temp src      SpO2 98 %     Weight 190 lb (86.2 kg)     Height      Head Circumference      Peak Flow      Pain Score 8     Pain Loc      Pain Edu?      Excl. in [place]?    No data found.  Updated Vital Signs BP (!) 150/77   Pulse 98   Temp 99.1 F (37.3  C)   Resp 16   Wt 86.2 kg   SpO2 98%   BMI 32.61 kg/m   Visual Acuity Right Eye Distance:   Left Eye Distance:   Bilateral Distance:    Right Eye Near:   Left Eye Near:    Bilateral Near:     Physical Exam Vitals signs reviewed.  HENT:     Nose: Nose normal.     Comments: Packing left nostil.   Cardiovascular:     Rate and Rhythm: Normal rate.  Pulmonary:     Effort: Pulmonary effort is normal.  Skin:    General: Skin is warm.  Neurological:     General: No focal deficit present.  Psychiatric:        Mood and Affect: Mood normal.      UC Treatments / Results  Labs (all labs ordered are listed, but only abnormal results are displayed) Labs Reviewed - No data to display  EKG None  Radiology No results found.  Procedures Procedures (including critical care time)  Medications Ordered in UC Medications - No data to display  Initial Impression / Assessment and Plan / UC Course  I have reviewed the triage vital signs and the nursing notes.  Pertinent labs & imaging results that were available during my care of the patient were reviewed by me and considered in my medical decision making (see chart for details).     MDM Packing removed.  Pt observed x 10 minutes, pt ambulated without bleeding Final Clinical Impressions(s) / UC Diagnoses   Final diagnoses:  Encounter for removal of nasal packing     Discharge Instructions     Return if any problems.    ED Prescriptions    None     Controlled Substance Prescriptions [place] Controlled Substance Registry consulted? Not Applicable   Fransico Meadow, [place] 11/26/18 1555

## 2018-11-26 NOTE — ED Triage Notes (Signed)
Pt here to have a she rhino rocket removed. It's been there for a week or more.

## 2018-12-06 MED FILL — PANTOPRAZOLE SOD DR 40 MG T: 40 | 90 days supply | Qty: 90 | Fill #0

## 2018-12-19 ENCOUNTER — Other Ambulatory Visit: Payer: Self-pay | Admitting: *Deleted

## 2018-12-19 DIAGNOSIS — Z7901 Long term (current) use of anticoagulants: Secondary | ICD-10-CM

## 2018-12-19 DIAGNOSIS — D689 Coagulation defect, unspecified: Secondary | ICD-10-CM

## 2018-12-19 LAB — PROTIME-INR
INR: 1.7 — ABNORMAL HIGH (ref 0.8–1.2)
Prothrombin Time: 17.9 s — ABNORMAL HIGH (ref 9.1–12.0)

## 2018-12-20 ENCOUNTER — Telehealth: Payer: Self-pay | Admitting: Pharmacist

## 2018-12-20 ENCOUNTER — Other Ambulatory Visit: Payer: Self-pay | Admitting: Internal Medicine

## 2018-12-20 MED FILL — LOSARTAN POTASSIUM 25 MG TA: 25 | 30 days supply | Qty: 30 | Fill #1

## 2018-12-20 MED FILL — WARFARIN SODIUM 4 MG TABLET: 4 | 90 days supply | Qty: 65 | Fill #0

## 2018-12-20 NOTE — Telephone Encounter (Signed)
Left VM: Venous drawn INR at [facility] 19-Dec-2018 reported today to me as 1.7 on 14mg  warfarin/wk (as 1/2 x 4mg  strength tablet daily). Advised her to INCREASE to 4mg  on Tu/Fri; 2mg  all other days, repeat INR in 4 weeks.

## 2018-12-28 MED FILL — COLESTIPOL HCL 1 GM TABLET: 1 | 30 days supply | Qty: 180 | Fill #1

## 2019-01-16 ENCOUNTER — Other Ambulatory Visit: Payer: Self-pay

## 2019-01-16 ENCOUNTER — Ambulatory Visit (INDEPENDENT_AMBULATORY_CARE_PROVIDER_SITE_OTHER): Payer: Medicare Other | Admitting: Pharmacist

## 2019-01-16 DIAGNOSIS — Z5181 Encounter for therapeutic drug level monitoring: Secondary | ICD-10-CM

## 2019-01-16 DIAGNOSIS — D689 Coagulation defect, unspecified: Secondary | ICD-10-CM

## 2019-01-16 DIAGNOSIS — Z7901 Long term (current) use of anticoagulants: Secondary | ICD-10-CM

## 2019-01-16 DIAGNOSIS — Z86718 Personal history of other venous thrombosis and embolism: Secondary | ICD-10-CM | POA: Diagnosis not present

## 2019-01-16 LAB — POCT INR: INR: 2.2 (ref 2.0–3.0)

## 2019-01-16 NOTE — Patient Instructions (Signed)
Patient instructed to take medications as defined in the Anti-coagulation Track section of this encounter.  Patient instructed to take today's dose.  Patient instructed to take 0.5 tablet of your 4mg  blue colored warfarin tablets by mouth once-daily--EXCEPT on TUESDAYS and FRIDAYS--take one (1) of your 4mg  blue colored warfarin tablets on Tuesdays and Fridays.  Patient verbalized understanding of these instructions.

## 2019-01-16 NOTE — Progress Notes (Signed)
Anticoagulation Management Pamela [name] is a 73 y.o. female who reports to the clinic for monitoring of warfarin treatment.    Indication: Clotting disorder; History of DVT; Long term current use of anticoagulant.    Duration: indefinite Supervising physician: Aldine Contes  Anticoagulation Clinic Visit History: Patient does not report signs/symptoms of bleeding or thromboembolism  Other recent changes: No diet, medications, lifestyle changes.  Anticoagulation Episode Summary    Current INR goal:  2.0-3.0  TTR:  71.5 % (8.4 y)  Next INR check:  02/20/2019  INR from last check:  2.2 (01/16/2019)  Weekly max warfarin dose:    Target end date:  Indefinite  INR check location:  Anticoagulation Clinic  Preferred lab:    Send INR reminders to:  [facility]   Indications   Clotting disorder ([place]) [D68.9] DVT HX OF (Resolved) [A35.573]       Comments:        Anticoagulation Care Providers    Provider Role Specialty Phone number   Burman Freestone, MD  Internal Medicine [phone]      Allergies  Allergen Reactions  . Ace Inhibitors Cough    Current Outpatient Medications:  .  cephALEXin (KEFLEX) 500 MG capsule, Take 1 capsule (500 mg total) by mouth 4 (four) times daily., Disp: 40 capsule, Rfl: 0 .  colestipol (COLESTID) 1 g tablet, TAKE 6 TABLET BY MOUTH QD, Disp: 180 tablet, Rfl: 11 .  fluticasone (FLONASE) 50 MCG/ACT nasal spray, Place 2 sprays into both nostrils daily., Disp: 1 g, Rfl: 0 .  glucosamine-chondroitin 500-400 MG tablet, Take 1 tablet by mouth 3 (three) times daily., Disp: , Rfl:  .  levothyroxine (SYNTHROID, LEVOTHROID) 88 MCG tablet, TAKE ONE (1) TABLET BY MOUTH EVERY DAY, Disp: 90 tablet, Rfl: 3 .  losartan (COZAAR) 25 MG tablet, TAKE ONE (1) TABLET BY MOUTH EVERY DAY, Disp: 90 tablet, Rfl: 3 .  Multiple Vitamins-Calcium (DAILY COMBO MULTIVITS/CALCIUM) TABS, Take 1 tablet by mouth daily.  , Disp: , Rfl:  .  pantoprazole (PROTONIX) 40 MG tablet,  TAKE ONE (1) TABLET BY MOUTH EVERY DAY, Disp: 90 tablet, Rfl: 3 .  warfarin (COUMADIN) 4 MG tablet, Take one tablet on Mon / Wed / Fri. Take one-half tablet all other days. Dose will change frequently., Disp: 65 tablet, Rfl: 11 .  warfarin (COUMADIN) 4 MG tablet, TAKE 1 TABLET BY MOUTH ON MONDAYS AND FRIDAYS.  TAKE 1/2 A TABLET BY MOUTH ALL OTHER DAYS., Disp: 65 tablet, Rfl: 11 .  warfarin (COUMADIN) 4 MG tablet, TAKE ONE TABLET BY MOUTH ON MONDAYS AND FRIDAYS AND TAKE ONE-HALF TABLET ALL OTHER DAYS, Disp: 65 tablet, Rfl: 11  Current Facility-Administered Medications:  .  cyanocobalamin ((VITAMIN B-12)) injection 1,000 mcg, 1,000 mcg, Intramuscular, Q30 days, Bartholomew Crews, MD, 1,000 mcg at 08/01/18 2376 Past Medical History:  Diagnosis Date  . CHOLELITHIASIS, WITH OBSTRUCTION 04/21/2006   s/p ERCP,sprincterotomy, stent ([facility])  . 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 GLAUCOMA 04/24/2008  . HYPERLIPIDEMIA 06/03/2006  . Hypothyroidism lifelong  . OBESITY, MILD 04/24/2008  . Tenosynovitis 01/2004   [facility]   Social History   Socioeconomic History  . Marital status: Widowed    Spouse name: Not on file  . Number of children: Not on file  . Years of education: Not on file  . Highest education level: Not on file  Occupational History  . Occupation: Engineer, structural: [employer]    Comment: Retired in [place]  . Financial resource strain: Not on file  . Food insecurity    Worry: Not on file    Inability: Not on file  . Transportation needs    Medical: Not on file    Non-medical: Not on file  Tobacco Use  . Smoking status: Former Smoker    Packs/day: 1.00    Years: 20.00    Pack years: 20.00    Quit date: 07/06/1985    Years since quitting: 33.5  . Smokeless tobacco: Former Systems developer  . Tobacco comment: Quit 1988  Substance and Sexual Activity  . Alcohol use: Yes    Comment: 2-3 glasses of wine  daily.  . Drug use: No  . Sexual activity: Not on file  Lifestyle  . Physical activity    Days per week: Not on file    Minutes per session: Not on file  . Stress: Not on file  Relationships  . Social Herbalist on phone: Not on file    Gets together: Not on file    Attends religious service: Not on file    Active member of club or organization: Not on file    Attends meetings of clubs or organizations: Not on file    Relationship status: Not on file  Other Topics Concern  . Not on file  Social History Narrative   Married, no regular exercise.   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 Then worked in [employer] with [employer].    Family History  Problem Relation Age of Onset  . Cancer Mother        H&N, smoker  . Cancer Sister        lung, 2011, smoker  . Other Other        grandmother had mastectomy in her 45's unknown reason  . Breast cancer Neg Hx     ASSESSMENT Recent Results: The most recent result is correlated with 18 mg per week: Lab Results  Component Value Date   INR 2.2 01/16/2019   INR 1.7 (H) 12/19/2018   INR 2.3 (H) 11/22/2018    Anticoagulation Dosing: Description   Take 0.5 tablet of your 4mg  blue colored warfarin tablets by mouth once-daily--EXCEPT on TUESDAYS and FRIDAYS--take one (1) of your 4mg  blue colored warfarin tablets on Tuesdays and Fridays.      INR today: Therapeutic  PLAN Weekly dose was unchanged. Remains on 18mg  warfarin/wk as 1/2 x 4mg  (2mg  dose) all days of week--except on Tuesdays and Fridays, 1x4mg .   Patient Instructions  Patient instructed to take medications as defined in the Anti-coagulation Track section of this encounter.  Patient instructed to take today's dose.  Patient instructed to take 0.5 tablet of your 4mg  blue colored warfarin tablets by mouth once-daily--EXCEPT on TUESDAYS and FRIDAYS--take one (1) of your 4mg  blue colored warfarin tablets on Tuesdays and  Fridays.  Patient verbalized understanding of these instructions.     Patient advised to contact clinic or seek medical attention if signs/symptoms of bleeding or thromboembolism occur.  Patient verbalized understanding by repeating back information and was advised to contact me if further medication-related questions arise. Patient was also provided an information handout.  Follow-up Return in 5 weeks (on 02/20/2019) for Follow up INR.  Pennie Banter , PharmD, CPP 15 minutes spent face-to-face with the patient during the encounter. 50% of time spent on  education, including signs/sx bleeding and clotting, as well as food and drug interactions with warfarin. 50% of time was spent on fingerprick POC INR sample collection,processing, results determination, and documentation in http://www.kim.net/.

## 2019-01-18 NOTE — Progress Notes (Signed)
INTERNAL MEDICINE TEACHING ATTENDING ADDENDUM - Gabryel Talamo M.D  Duration- indefinite, Indication- recurrent DVT, Factor II mutation, INR- therapeutic. Agree with pharmacy recommendations as outlined in their note.

## 2019-01-27 ENCOUNTER — Other Ambulatory Visit: Payer: Self-pay | Admitting: Internal Medicine

## 2019-01-31 ENCOUNTER — Other Ambulatory Visit: Payer: Self-pay | Admitting: Internal Medicine

## 2019-01-31 ENCOUNTER — Encounter: Payer: Self-pay | Admitting: Internal Medicine

## 2019-01-31 ENCOUNTER — Other Ambulatory Visit: Payer: Self-pay

## 2019-01-31 DIAGNOSIS — R509 Fever, unspecified: Secondary | ICD-10-CM

## 2019-01-31 DIAGNOSIS — Z20822 Contact with and (suspected) exposure to covid-19: Secondary | ICD-10-CM

## 2019-02-01 ENCOUNTER — Encounter: Payer: Self-pay | Admitting: Internal Medicine

## 2019-02-02 ENCOUNTER — Telehealth: Payer: Self-pay | Admitting: Internal Medicine

## 2019-02-02 LAB — NOVEL CORONAVIRUS, NAA: SARS-CoV-2, NAA: NOT DETECTED

## 2019-02-02 NOTE — Telephone Encounter (Signed)
Called pt, she was given the results, thanked me and stated she got an auto message thru [place] and is going to visit friends next week since she got the news, wished her to have a nice trip, call ended

## 2019-02-02 NOTE — Telephone Encounter (Signed)
tried to give her Covid test results

## 2019-02-02 NOTE — Telephone Encounter (Signed)
-----   Message from Bartholomew Crews, MD sent at 02/02/2019  1:12 PM EDT ----- I tried both numbers. Didn't leave message. Would you pls try to call later?

## 2019-02-20 ENCOUNTER — Ambulatory Visit (INDEPENDENT_AMBULATORY_CARE_PROVIDER_SITE_OTHER): Payer: Medicare Other | Admitting: Pharmacist

## 2019-02-20 ENCOUNTER — Other Ambulatory Visit: Payer: Self-pay

## 2019-02-20 DIAGNOSIS — Z7901 Long term (current) use of anticoagulants: Secondary | ICD-10-CM | POA: Diagnosis not present

## 2019-02-20 DIAGNOSIS — Z86718 Personal history of other venous thrombosis and embolism: Secondary | ICD-10-CM

## 2019-02-20 DIAGNOSIS — Z5181 Encounter for therapeutic drug level monitoring: Secondary | ICD-10-CM

## 2019-02-20 DIAGNOSIS — D689 Coagulation defect, unspecified: Secondary | ICD-10-CM

## 2019-02-20 LAB — POCT INR: INR: 3.8 — AB (ref 2.0–3.0)

## 2019-02-20 NOTE — Patient Instructions (Signed)
Patient instructed to take medications as defined in the Anti-coagulation Track section of this encounter.  Patient instructed to take today's dose.  Patient instructed to take 1/2 tablet of your 4mg  blue colored warfarin tablets by mouth once-daily.  Patient verbalized understanding of these instructions.

## 2019-02-20 NOTE — Progress Notes (Signed)
INTERNAL MEDICINE TEACHING ATTENDING ADDENDUM - Aldine Contes M.D  Duration- indefinite, Indication- recurrent DVT, INR- supratherapeutic. Agree with pharmacy recommendations as outlined in their note.

## 2019-02-20 NOTE — Progress Notes (Signed)
Pamela [name] is a 73 y.o. female who reports to the clinic for monitoring of warfarin treatment.    Indication: DVT, History of (Resolved); Diagnosed clotting disorder; Long term current use of anticoagulant.   Duration: indefinite Supervising physician: Aldine Contes  [facility] Clinic Visit History: Patient does not report signs/symptoms of bleeding or thromboembolism  Other recent changes: No diet, medications, lifestyle changes.  Pamela Episode Summary    Current INR goal:  2.0-3.0  TTR:  71.3 % (8.5 y)  Next INR check:  03/20/2019  INR from last check:    Weekly max warfarin dose:    Target end date:  Indefinite  INR check location[facility]  [facility]  Preferred lab:    Send INR reminders to:  [facility]   Indications   Clotting disorder ([place]) [D68.9] DVT HX OF (Resolved) [U23.536]       Comments:        Pamela Care Providers    Provider Role Specialty Phone number   Burman Freestone, MD  Internal Medicine [phone]      Allergies  Allergen Reactions  . Ace Inhibitors Cough    Current Outpatient Medications:  .  cephALEXin (KEFLEX) 500 MG capsule, Take 1 capsule (500 mg total) by mouth 4 (four) times daily., Disp: 40 capsule, Rfl: 0 .  colestipol (COLESTID) 1 g tablet, TAKE 6 TABLET BY MOUTH QD, Disp: 180 tablet, Rfl: 11 .  fluticasone (FLONASE) 50 MCG/ACT nasal spray, Place 2 sprays into both nostrils daily., Disp: 1 g, Rfl: 0 .  glucosamine-chondroitin 500-400 MG tablet, Take 1 tablet by mouth 3 (three) times daily., Disp: , Rfl:  .  levothyroxine (SYNTHROID) 88 MCG tablet, TAKE ONE (1) TABLET BY MOUTH EVERY DAY, Disp: 90 tablet, Rfl: 3 .  losartan (COZAAR) 25 MG tablet, TAKE ONE (1) TABLET BY MOUTH EVERY DAY, Disp: 90 tablet, Rfl: 3 .  Multiple Vitamins-Calcium (DAILY COMBO MULTIVITS/CALCIUM) TABS, Take 1 tablet by mouth daily.  , Disp: , Rfl:  .  pantoprazole (PROTONIX) 40 MG tablet, TAKE  ONE (1) TABLET BY MOUTH EVERY DAY, Disp: 90 tablet, Rfl: 3 .  warfarin (COUMADIN) 4 MG tablet, Take one tablet on Mon / Wed / Fri. Take one-half tablet all other days. Dose will change frequently., Disp: 65 tablet, Rfl: 11 .  warfarin (COUMADIN) 4 MG tablet, TAKE 1 TABLET BY MOUTH ON MONDAYS AND FRIDAYS.  TAKE 1/2 A TABLET BY MOUTH ALL OTHER DAYS., Disp: 65 tablet, Rfl: 11 .  warfarin (COUMADIN) 4 MG tablet, TAKE ONE TABLET BY MOUTH ON MONDAYS AND FRIDAYS AND TAKE ONE-HALF TABLET ALL OTHER DAYS, Disp: 65 tablet, Rfl: 11  Current Facility-Administered Medications:  .  cyanocobalamin ((VITAMIN B-12)) injection 1,000 mcg, 1,000 mcg, Intramuscular, Q30 days, Bartholomew Crews, MD, 1,000 mcg at 08/01/18 6761 Past Medical History:  Diagnosis Date  . CHOLELITHIASIS, WITH OBSTRUCTION 04/21/2006   s/p ERCP,sprincterotomy, stent ([facility])  . 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 GLAUCOMA 04/24/2008  . HYPERLIPIDEMIA 06/03/2006  . Hypothyroidism lifelong  . OBESITY, MILD 04/24/2008  . Tenosynovitis 01/2004   [facility]   Social History   Socioeconomic History  . Marital status: Widowed    Spouse name: Not on file  . Number of children: Not on file  . Years of education: Not on file  . Highest education level: Not on file  Occupational History  . Occupation: Engineer, structural: [employer]  Comment: Retired in [place]  . Financial resource strain: Not on file  . Food insecurity    Worry: Not on file    Inability: Not on file  . Transportation needs    Medical: Not on file    Non-medical: Not on file  Tobacco Use  . Smoking status: Former Smoker    Packs/day: 1.00    Years: 20.00    Pack years: 20.00    Quit date: 07/06/1985    Years since quitting: 33.6  . Smokeless tobacco: Former Systems developer  . Tobacco comment: Quit 1988  Substance and Sexual Activity  . Alcohol use: Yes    Comment: 2-3 glasses of wine daily.  .  Drug use: No  . Sexual activity: Not on file  Lifestyle  . Physical activity    Days per week: Not on file    Minutes per session: Not on file  . Stress: Not on file  Relationships  . Social Herbalist on phone: Not on file    Gets together: Not on file    Attends religious service: Not on file    Active member of club or organization: Not on file    Attends meetings of clubs or organizations: Not on file    Relationship status: Not on file  Other Topics Concern  . Not on file  Social History Narrative   Married, no regular exercise.   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    Family History  Problem Relation Age of Onset  . Cancer Mother        H&N, smoker  . Cancer Sister        lung, 2011, smoker  . Other Other        grandmother had mastectomy in her 69's unknown reason  . Breast cancer Neg Hx     ASSESSMENT Recent Results: The most recent result is correlated with 18 mg per week: Lab Results  Component Value Date   INR 3.8 (A) 02/20/2019   INR 2.2 01/16/2019   INR 1.7 (H) 12/19/2018    [facility]am[facility] Dosing: Description   Take 1/2 tablet of your 4mg  blue colored warfarin tablets by mouth once-daily.       INR today: Supratherapeutic  PLAN Weekly dose was decreased by 20% to 14 mg per week  Patient Instructions  Patient instructed to take medications as defined in the Anti-coagulation Track section of this encounter.  Patient instructed to take today's dose.  Patient instructed to take 1/2 tablet of your 4mg  blue colored warfarin tablets by mouth once-daily.  Patient verbalized understanding of these instructions.     Patient advised to contact clinic or seek medical attention if signs/symptoms of bleeding or thromboembolism occur.  Patient verbalized understanding by repeating back information and was advised to contact me if further medication-related  questions arise. Patient was also provided an information handout.  Follow-up Return in about 4 weeks (around 03/20/2019) for Follow up INR.  Pennie Banter, PharmD, CPP  15 minutes spent face-to-face with the patient during the encounter. 50% of time spent on education, including signs/sx bleeding and clotting, as well as food and drug interactions with warfarin. 50% of time was spent on fingerprick POC INR sample collection,processing, results determination, and documentation in http://www.kim.net/.

## 2019-03-02 ENCOUNTER — Encounter: Payer: Self-pay | Admitting: Internal Medicine

## 2019-03-02 ENCOUNTER — Other Ambulatory Visit: Payer: Self-pay

## 2019-03-02 ENCOUNTER — Ambulatory Visit (INDEPENDENT_AMBULATORY_CARE_PROVIDER_SITE_OTHER): Payer: Medicare Other | Admitting: Internal Medicine

## 2019-03-02 VITALS — BP 128/85 | HR 78 | Temp 98.7°F | Wt 193.9 lb

## 2019-03-02 DIAGNOSIS — Z7289 Other problems related to lifestyle: Secondary | ICD-10-CM

## 2019-03-02 DIAGNOSIS — M1990 Unspecified osteoarthritis, unspecified site: Secondary | ICD-10-CM | POA: Insufficient documentation

## 2019-03-02 DIAGNOSIS — E039 Hypothyroidism, unspecified: Secondary | ICD-10-CM | POA: Diagnosis not present

## 2019-03-02 DIAGNOSIS — I4891 Unspecified atrial fibrillation: Secondary | ICD-10-CM | POA: Diagnosis not present

## 2019-03-02 DIAGNOSIS — I10 Essential (primary) hypertension: Secondary | ICD-10-CM | POA: Diagnosis not present

## 2019-03-02 DIAGNOSIS — Z0000 Encounter for general adult medical examination without abnormal findings: Secondary | ICD-10-CM

## 2019-03-02 DIAGNOSIS — M8580 Other specified disorders of bone density and structure, unspecified site: Secondary | ICD-10-CM

## 2019-03-02 DIAGNOSIS — K909 Intestinal malabsorption, unspecified: Secondary | ICD-10-CM

## 2019-03-02 DIAGNOSIS — M1711 Unilateral primary osteoarthritis, right knee: Secondary | ICD-10-CM

## 2019-03-02 DIAGNOSIS — K9089 Other intestinal malabsorption: Secondary | ICD-10-CM

## 2019-03-02 DIAGNOSIS — K219 Gastro-esophageal reflux disease without esophagitis: Secondary | ICD-10-CM

## 2019-03-02 DIAGNOSIS — D6869 Other thrombophilia: Secondary | ICD-10-CM | POA: Diagnosis not present

## 2019-03-02 DIAGNOSIS — Z7901 Long term (current) use of anticoagulants: Secondary | ICD-10-CM

## 2019-03-02 DIAGNOSIS — Z79890 Hormone replacement therapy (postmenopausal): Secondary | ICD-10-CM

## 2019-03-02 DIAGNOSIS — Z9049 Acquired absence of other specified parts of digestive tract: Secondary | ICD-10-CM

## 2019-03-02 DIAGNOSIS — R197 Diarrhea, unspecified: Secondary | ICD-10-CM

## 2019-03-02 DIAGNOSIS — Z79899 Other long term (current) drug therapy: Secondary | ICD-10-CM

## 2019-03-02 HISTORY — DX: Unilateral primary osteoarthritis, right knee: M17.11

## 2019-03-02 NOTE — Assessment & Plan Note (Signed)
This problem is new.  She states that she has increased her wine intake from 2-3 drinks a day to 4 to 5 glasses of wine which are about 6 ounces.  She states she is simply doing this for something to do and something to look forward to.  This seems to be in response to the coronavirus and stay at home recommendations.  She states that she has had no untoward consequences of her increased alcohol intake.  She seems to meet the definition for unhealthy alcohol usage simply based on her quantity but I have not been able to discern any adverse consequences as her blood pressure is controlled.  She is not driving after drinking.  I will continue to follow her alcohol usage and recommended that she keep an eye on it.  PLAN:  Follow

## 2019-03-02 NOTE — Progress Notes (Signed)
   Subjective:    Patient ID: Pamela [name], female    DOB: July 28, 1945, 73 y.o.   MRN: [id number]  HPI  Pamela [name] is here for HTN F/U. Please see the A&P for the status of the pt's chronic medical problems.  ROS : per ROS section and in problem oriented charting. All other systems are negative.  PMHx, Soc hx, and / or Fam hx : Lives alone in a house.  Retired in 2011.  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   Review of Systems  HENT: Negative for nosebleeds.   Gastrointestinal: Negative for abdominal pain and constipation.  Musculoskeletal: Positive for arthralgias.  Skin: Positive for color change.  Psychiatric/Behavioral: Positive for sleep disturbance.       Objective:   Physical Exam Vitals signs and nursing note reviewed.  Constitutional:      General: She is not in acute distress.    Appearance: Normal appearance. She is not ill-appearing, toxic-appearing or diaphoretic.  HENT:     Head: Normocephalic and atraumatic.     Right Ear: External ear normal.     Left Ear: External ear normal.  Eyes:     General: No scleral icterus.       Right eye: No discharge.        Left eye: No discharge.     Extraocular Movements: Extraocular movements intact.     Conjunctiva/sclera: Conjunctivae normal.  Cardiovascular:     Rate and Rhythm: Normal rate and regular rhythm.     Heart sounds: Normal heart sounds.     Comments: Trace lower extremity edema bilaterally Pulmonary:     Effort: Pulmonary effort is normal. No respiratory distress.     Breath sounds: Normal breath sounds. No rhonchi.  Musculoskeletal:        General: No swelling, tenderness, deformity or signs of injury.  Skin:    General: Skin is warm and dry.  Neurological:     General: No focal deficit present.     Mental Status: She is alert. Mental status is at baseline.  Psychiatric:        Mood and Affect: Mood normal.        Behavior: Behavior normal.        Thought  Content: Thought content normal.        Judgment: Judgment normal.       Assessment & Plan:

## 2019-03-02 NOTE — Patient Instructions (Signed)
1. I will investigate colestid 2. Keep an eye on your wine intake 3. I will message your thyroid results

## 2019-03-02 NOTE — Assessment & Plan Note (Signed)
This problem is chronic and stable.  She is on her PPI daily without any side effects to the medication or significant heartburn symptoms.  PLAN:  Cont current meds

## 2019-03-02 NOTE — Assessment & Plan Note (Signed)
This problem is chronic and improved.  She has post cholecystectomy diarrhea due to bile acids.  At her last appointment, we discussed increasing her Colestid from 1 g a day up to a maximum of 6 g a day because I had read most of the benefit occurs in the 1st 5 gram dosing.  She has increased her dose and is noticing improvement.  Her bowel movements are firmer although in the mornings, she has 2 and sometimes up to 5 loose bowel movements in the morning.  But overall, with the higher dose of Colestid, she has noticed improvement.  We discussed dosing and timing.  There appears to be a ceiling of 16 g although this is based on cholesterol treatment and not postcholecystectomy. Some sources, including Dr. Watt Climes who diagnosed her in 2013, had recommended evening dosing. I encouraged her to experiment with timing and dosing to get the best results.  I will push up her dosing limited to 10 g based on my reading.  PLAN : To increase dosing up to 10 mg as needed and may take once a day or twice a day, morning or evening.  Adjustment will be based on response.

## 2019-03-02 NOTE — Assessment & Plan Note (Signed)
This problem is new.  She had plan to get a total knee replacement of the right this summer but everything got delayed with the coronavirus.  Now, she does not know if she will be able to take the time off in September.  I will follow-up with her at her next appointment.

## 2019-03-02 NOTE — Assessment & Plan Note (Signed)
Chronic and stable. Due to A Fib. O warfarin. Not interested in [place]. Dr Elie Confer manages warfarin.  PLAN:  Cont current meds

## 2019-03-02 NOTE — Assessment & Plan Note (Signed)
This problem is chronic and uncontrolled.  Her DEXA scan in 2015 showed osteopenia.  She does not take a lot of dietary calcium and has difficulty remembering to take her supplement.  We discussed that there are no great recommendations for repeat DEXA scanning but she has noticed that her height has decreased by about an inch so we decided to go ahead and get a repeat DEXA.  If her T-scores are not consistent with osteoporosis, I would recommend a bisphosphonate.  PLAN : DEXA scan

## 2019-03-02 NOTE — Assessment & Plan Note (Signed)
This problem is chronic and well controlled.  She is on losartan 25 mg once a day without any side effects.  Her blood pressure is at goal on repeat today.  PLAN:  Cont current meds  BP Readings from Last 3 Encounters:  03/02/19 128/85  11/26/18 (!) 150/77  11/24/18 137/84

## 2019-03-02 NOTE — Assessment & Plan Note (Signed)
Pamela [name] is chronic and stable.  She is on Synthroid 88 mcg/day and is due for a TSH test.  She has no signs or symptoms of hypo-or hyperthyroidism.  PLAN : cont meds TSH

## 2019-03-03 LAB — TSH: TSH: 0.772 u[IU]/mL (ref 0.450–4.500)

## 2019-03-16 ENCOUNTER — Other Ambulatory Visit: Payer: Self-pay | Admitting: Internal Medicine

## 2019-03-20 ENCOUNTER — Other Ambulatory Visit: Payer: Self-pay

## 2019-03-20 ENCOUNTER — Ambulatory Visit (INDEPENDENT_AMBULATORY_CARE_PROVIDER_SITE_OTHER): Payer: Medicare Other

## 2019-03-20 DIAGNOSIS — Z5181 Encounter for therapeutic drug level monitoring: Secondary | ICD-10-CM | POA: Diagnosis not present

## 2019-03-20 DIAGNOSIS — Z86718 Personal history of other venous thrombosis and embolism: Secondary | ICD-10-CM

## 2019-03-20 DIAGNOSIS — D689 Coagulation defect, unspecified: Secondary | ICD-10-CM

## 2019-03-20 DIAGNOSIS — Z7901 Long term (current) use of anticoagulants: Secondary | ICD-10-CM

## 2019-03-20 DIAGNOSIS — D6869 Other thrombophilia: Secondary | ICD-10-CM | POA: Diagnosis not present

## 2019-03-20 LAB — POCT INR: INR: 2.8 (ref 2.0–3.0)

## 2019-03-20 NOTE — Progress Notes (Signed)
Anticoagulation Management Pamela [name] is a 73 y.o. female who reports to the clinic for monitoring of warfarin treatment.    Indication: DVT, recurrence; history of long-term anticoagulation.  Duration: indefinite Supervising physician: Joni Reining  Anticoagulation Clinic Visit History: Patient does not report signs/symptoms of bleeding or thromboembolism.  Other recent changes: no diet, medications, lifestyle changes endorsed.  Anticoagulation Episode Summary    Current INR goal:  2.0-3.0  TTR:  70.8 % (8.6 y)  Next INR check:  03/20/2019  INR from last check:  2.8 (03/20/2019)  Weekly max warfarin dose:    Target end date:  Indefinite  INR check location:  Anticoagulation Clinic  Preferred lab:    Send INR reminders to:  ANTICOAG IMP   Indications   Secondary hypercoagulable state ([place]) [D68.69] DVT HX OF (Resolved) [id number]       Comments:        Anticoagulation Care Providers    Provider Role Specialty Phone number   Burman Freestone, MD  Internal Medicine [phone]      Allergies  Allergen Reactions  . Ace Inhibitors Cough    Current Outpatient Medications:  .  cephALEXin (KEFLEX) 500 MG capsule, Take 1 capsule (500 mg total) by mouth 4 (four) times daily., Disp: 40 capsule, Rfl: 0 .  colestipol (COLESTID) 1 g tablet, TAKE 10 TABLETS BY MOUTH EVERY DAY, Disp: 900 tablet, Rfl: 3 .  fluticasone (FLONASE) 50 MCG/ACT nasal spray, Place 2 sprays into both nostrils daily., Disp: 1 g, Rfl: 0 .  glucosamine-chondroitin 500-400 MG tablet, Take 1 tablet by mouth 3 (three) times daily., Disp: , Rfl:  .  levothyroxine (SYNTHROID) 88 MCG tablet, TAKE ONE (1) TABLET BY MOUTH EVERY DAY, Disp: 90 tablet, Rfl: 3 .  losartan (COZAAR) 25 MG tablet, TAKE ONE (1) TABLET BY MOUTH EVERY DAY, Disp: 90 tablet, Rfl: 3 .  Multiple Vitamins-Calcium (DAILY COMBO MULTIVITS/CALCIUM) TABS, Take 1 tablet by mouth daily.  , Disp: , Rfl:  .  pantoprazole (PROTONIX) 40 MG tablet, TAKE  ONE (1) TABLET BY MOUTH EVERY DAY, Disp: 90 tablet, Rfl: 3 .  warfarin (COUMADIN) 4 MG tablet, Take one tablet on Mon / Wed / Fri. Take one-half tablet all other days. Dose will change frequently., Disp: 65 tablet, Rfl: 11 .  warfarin (COUMADIN) 4 MG tablet, TAKE 1 TABLET BY MOUTH ON MONDAYS AND FRIDAYS.  TAKE 1/2 A TABLET BY MOUTH ALL OTHER DAYS., Disp: 65 tablet, Rfl: 11 .  warfarin (COUMADIN) 4 MG tablet, TAKE ONE TABLET BY MOUTH ON MONDAYS AND FRIDAYS AND TAKE ONE-HALF TABLET ALL OTHER DAYS, Disp: 65 tablet, Rfl: 11  Current Facility-Administered Medications:  .  cyanocobalamin ((VITAMIN B-12)) injection 1,000 mcg, 1,000 mcg, Intramuscular, Q30 days, Bartholomew Crews, MD, 1,000 mcg at 08/01/18 [id number] Past Medical History:  Diagnosis Date  . CHOLELITHIASIS, WITH OBSTRUCTION 04/21/2006   s/p ERCP,sprincterotomy, stent ([facility])  . 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 GLAUCOMA 04/24/2008  . HYPERLIPIDEMIA 06/03/2006  . Hypothyroidism lifelong  . OBESITY, MILD 04/24/2008  . Tenosynovitis 01/2004   Sypher   Social History   Socioeconomic History  . Marital status: Widowed    Spouse name: Not on file  . Number of children: Not on file  . Years of education: Not on file  . Highest education level: Not on file  Occupational History  . 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 Financial resource strain: Not on file  . Food insecurity    Worry: Not on file    Inability: Not on file  . Transportation needs    Medical: Not on file    Non-medical: Not on file  Tobacco Use  . Smoking status: Former Smoker    Packs/day: 1.00    Years: 20.00    Pack years: 20.00    Quit date: 07/06/1985    Years since quitting: 33.7  . Smokeless tobacco: Former Systems developer  . Tobacco comment: Quit 1988  Substance and Sexual Activity  . Alcohol use: Yes    Comment: 2-3 glasses of wine daily.  .  Drug use: No  . Sexual activity: Not on file  Lifestyle  . Physical activity    Days per week: Not on file    Minutes per session: Not on file  . Stress: Not on file  Relationships  . Social Herbalist on phone: Not on file    Gets together: Not on file    Attends religious service: Not on file    Active member of club or organization: Not on file    Attends meetings of clubs or organizations: Not on file    Relationship status: Not on file  Other Topics Concern  . Not on file  Social History Narrative   Married, no regular exercise.   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    Family History  Problem Relation Age of Onset  . Cancer Mother        H&N, smoker  . Cancer Sister        lung, 2011, smoker  . Other Other        grandmother had mastectomy in her 38's unknown reason  . Breast cancer Neg Hx     ASSESSMENT Recent Results: The most recent result is correlated with 14 mg per week: Lab Results  Component Value Date   INR 2.8 03/20/2019   INR 3.8 (A) 02/20/2019   INR 2.2 01/16/2019    Anticoagulation Dosing: Description   Take 1/2 tablet of your 4mg  blue colored warfarin tablets by mouth once-daily.       INR today: Therapeutic  PLAN Weekly dose was unchanged   Patient Instructions  Patient instructed to take medications as defined in the Anti-coagulation Track section of this encounter.  Patient instructed to take today's dose.  Patient instructed to take 1/2 tablet of your 4mg  blue colored warfarin tablets by mouth once-daily.   Patient verbalized understanding of these instructions.     Patient advised to contact clinic or seek medical attention if signs/symptoms of bleeding or thromboembolism occur.  Patient verbalized understanding by repeating back information and was advised to contact me if further medication-related questions arise. Patient was also  provided an information handout.  Follow-up Return in 6 weeks (on 05/01/2019) for Follow-up INR at 9:30.  Acey Lav, PharmD  PGY1 Acute Care Pharmacy Resident [phone]  15 minutes spent face-to-face with the patient during the encounter. 50% of time spent on education, including signs/sx bleeding and clotting, as well as food and drug interactions with warfarin. 50% of time was spent on fingerprick POC INR sample collection,processing, results determination, and documentation in [email or web address]/.

## 2019-03-20 NOTE — Patient Instructions (Signed)
Patient instructed to take medications as defined in the Anti-coagulation Track section of this encounter.  Patient instructed to take today's dose.  Patient instructed to take 1/2 tablet of your 4mg blue colored warfarin tablets by mouth once-daily.  Patient verbalized understanding of these instructions.    

## 2019-03-21 ENCOUNTER — Other Ambulatory Visit: Payer: Self-pay | Admitting: Obstetrics and Gynecology

## 2019-03-21 DIAGNOSIS — Z1231 Encounter for screening mammogram for malignant neoplasm of breast: Secondary | ICD-10-CM

## 2019-04-23 ENCOUNTER — Encounter: Payer: Self-pay | Admitting: Internal Medicine

## 2019-04-24 ENCOUNTER — Other Ambulatory Visit: Payer: Self-pay | Admitting: Internal Medicine

## 2019-04-24 DIAGNOSIS — I10 Essential (primary) hypertension: Secondary | ICD-10-CM

## 2019-04-24 DIAGNOSIS — E538 Deficiency of other specified B group vitamins: Secondary | ICD-10-CM

## 2019-04-27 ENCOUNTER — Ambulatory Visit
Admission: RE | Admit: 2019-04-27 | Discharge: 2019-04-27 | Disposition: A | Discharge status: Home / Self Care | Payer: Medicare Other | Source: Ambulatory Visit | Attending: Internal Medicine | Admitting: Internal Medicine

## 2019-04-27 ENCOUNTER — Other Ambulatory Visit: Payer: Self-pay

## 2019-04-27 DIAGNOSIS — M8580 Other specified disorders of bone density and structure, unspecified site: Secondary | ICD-10-CM

## 2019-05-01 ENCOUNTER — Ambulatory Visit (INDEPENDENT_AMBULATORY_CARE_PROVIDER_SITE_OTHER): Payer: Medicare Other | Admitting: Pharmacist

## 2019-05-01 ENCOUNTER — Other Ambulatory Visit (INDEPENDENT_AMBULATORY_CARE_PROVIDER_SITE_OTHER): Payer: Medicare Other

## 2019-05-01 ENCOUNTER — Other Ambulatory Visit: Payer: Self-pay

## 2019-05-01 DIAGNOSIS — Z5181 Encounter for therapeutic drug level monitoring: Secondary | ICD-10-CM | POA: Diagnosis not present

## 2019-05-01 DIAGNOSIS — D6869 Other thrombophilia: Secondary | ICD-10-CM

## 2019-05-01 DIAGNOSIS — Z7901 Long term (current) use of anticoagulants: Secondary | ICD-10-CM | POA: Diagnosis not present

## 2019-05-01 DIAGNOSIS — E538 Deficiency of other specified B group vitamins: Secondary | ICD-10-CM | POA: Diagnosis not present

## 2019-05-01 DIAGNOSIS — I10 Essential (primary) hypertension: Secondary | ICD-10-CM | POA: Diagnosis not present

## 2019-05-01 DIAGNOSIS — Z86718 Personal history of other venous thrombosis and embolism: Secondary | ICD-10-CM

## 2019-05-01 DIAGNOSIS — Z23 Encounter for immunization: Secondary | ICD-10-CM | POA: Diagnosis not present

## 2019-05-01 LAB — POCT INR: INR: 5.2 — AB (ref 2.0–3.0)

## 2019-05-01 NOTE — Patient Instructions (Signed)
Patient instructed to take medications as defined in the Anti-coagulation Track section of this encounter.  Patient instructed to OMIT TOMORROW'S dose (Has already taken today's dose she states).   Patient instructed to take 1/2 tablet of your 4mg  blue colored warfarin tablets by mouth once-daily--EXCEPT on TUESDAYS, OMIT doses on ALL TUESDAYS.  Recommence taking warfarin on Thursday 04-May-2019.  Patient verbalized understanding of these instructions.

## 2019-05-01 NOTE — Progress Notes (Signed)
Anti-Coagulation Progress Note  Pamela [name] is a 73 y.o. female who is currently on an anti-coagulation regimen.    RECENT RESULTS: Recent results are below, the most recent result is correlated with a dose of 14 mg. per week: Lab Results  Component Value Date   INR 5.2 (A) 05/01/2019   INR 2.8 03/20/2019   INR 3.8 (A) 02/20/2019    ANTI-COAG DOSE: Description   Take 1/2 tablet of your 4mg  blue colored warfarin tablets by mouth once-daily--EXCEPT on TUESDAYS, OMIT doses on ALL TUESDAYS.         ANTICOAG SUMMARY: Anticoagulation Episode Summary    Current INR goal:  2.0-3.0  TTR:  70.0 % (8.7 y)  Next INR check:  05/22/2019  INR from last check:  5.2 (05/01/2019)  Weekly max warfarin dose:    Target end date:  Indefinite  INR check location:  Anticoagulation Clinic  Preferred lab:    Send INR reminders to:  ANTICOAG IMP   Indications   Secondary hypercoagulable state ([place]) [D68.69] DVT HX OF (Resolved) [Z86.718]       Comments:        Anticoagulation Care Providers    Provider Role Specialty Phone number   Burman Freestone, MD  Internal Medicine [phone]      ANTICOAG TODAY: Anticoagulation Summary  As of 05/01/2019   INR goal:  2.0-3.0  TTR:  70.0 % (8.7 y)  INR used for dosing:  5.2 (05/01/2019)  Warfarin maintenance plan:  0 mg every Tue; 2 mg (4 mg x 0.5) all other days  Weekly warfarin total:  12 mg  Plan last modified:  [name] B, RPH-CPP (05/01/2019)  Next INR check:  05/22/2019  Priority:  Routine  Target end date:  Indefinite   Indications   Secondary hypercoagulable state ([place]) [D68.69] DVT HX OF (Resolved) [Z86.718]        Anticoagulation Episode Summary    INR check location:  Anticoagulation Clinic   Preferred lab:     Send INR reminders to:  ANTICOAG IMP   Comments:      Anticoagulation Care Providers    Provider Role Specialty Phone number   Burman Freestone, MD  Internal Medicine [phone]      PATIENT  INSTRUCTIONS: Patient Instructions  Patient instructed to take medications as defined in the Anti-coagulation Track section of this encounter.  Patient instructed to OMIT TOMORROW'S dose (Has already taken today's dose she states).   Patient instructed to take 1/2 tablet of your 4mg  blue colored warfarin tablets by mouth once-daily--EXCEPT on TUESDAYS, OMIT doses on ALL TUESDAYS.  Recommence taking warfarin on Thursday 04-May-2019.  Patient verbalized understanding of these instructions.      FOLLOW-UP Return in 3 weeks (on 05/22/2019) for Follow up INR.  [name], III Pharm.D., CACP

## 2019-05-02 ENCOUNTER — Encounter: Payer: Self-pay | Admitting: Internal Medicine

## 2019-05-02 LAB — CBC
Hematocrit: 43.4 % (ref 34.0–46.6)
Hemoglobin: 13.8 g/dL (ref 11.1–15.9)
MCH: 32.2 pg (ref 26.6–33.0)
MCHC: 31.8 g/dL (ref 31.5–35.7)
MCV: 101 fL — ABNORMAL HIGH (ref 79–97)
Platelets: 368 10*3/uL (ref 150–450)
RBC: 4.29 x10E6/uL (ref 3.77–5.28)
RDW: 12.7 % (ref 11.7–15.4)
WBC: 9 10*3/uL (ref 3.4–10.8)

## 2019-05-02 LAB — BMP8+ANION GAP
Anion Gap: 20 mmol/L — ABNORMAL HIGH (ref 10.0–18.0)
BUN/Creatinine Ratio: 17 (ref 12–28)
BUN: 16 mg/dL (ref 8–27)
CO2: 21 mmol/L (ref 20–29)
Calcium: 9.9 mg/dL (ref 8.7–10.3)
Chloride: 100 mmol/L (ref 96–106)
Creatinine, Ser: 0.94 mg/dL (ref 0.57–1.00)
GFR calc Af Amer: 69 mL/min/{1.73_m2} (ref >59)
GFR calc non Af Amer: 60 mL/min/{1.73_m2} (ref >59)
Glucose: 97 mg/dL (ref 65–99)
Potassium: 4.6 mmol/L (ref 3.5–5.2)
Sodium: 141 mmol/L (ref 134–144)

## 2019-05-02 LAB — LIPID PANEL
Chol/HDL Ratio: 2.2 ratio (ref 0.0–4.4)
Cholesterol, Total: 197 mg/dL (ref 100–199)
HDL: 91 mg/dL (ref >39)
LDL Chol Calc (NIH): 89 mg/dL (ref 0–99)
Triglycerides: 97 mg/dL (ref 0–149)
VLDL Cholesterol Cal: 17 mg/dL (ref 5–40)

## 2019-05-08 ENCOUNTER — Ambulatory Visit
Admission: RE | Admit: 2019-05-08 | Discharge: 2019-05-08 | Disposition: A | Discharge status: Home / Self Care | Payer: Medicare Other | Source: Ambulatory Visit | Attending: Obstetrics and Gynecology | Admitting: Obstetrics and Gynecology

## 2019-05-08 ENCOUNTER — Other Ambulatory Visit: Payer: Self-pay

## 2019-05-08 DIAGNOSIS — Z1231 Encounter for screening mammogram for malignant neoplasm of breast: Secondary | ICD-10-CM

## 2019-05-22 ENCOUNTER — Other Ambulatory Visit: Payer: Self-pay

## 2019-05-22 ENCOUNTER — Ambulatory Visit (INDEPENDENT_AMBULATORY_CARE_PROVIDER_SITE_OTHER): Payer: Medicare Other | Admitting: Internal Medicine

## 2019-05-22 DIAGNOSIS — D6869 Other thrombophilia: Secondary | ICD-10-CM | POA: Diagnosis not present

## 2019-05-22 DIAGNOSIS — Z5181 Encounter for therapeutic drug level monitoring: Secondary | ICD-10-CM

## 2019-05-22 DIAGNOSIS — Z86718 Personal history of other venous thrombosis and embolism: Secondary | ICD-10-CM

## 2019-05-22 DIAGNOSIS — Z7901 Long term (current) use of anticoagulants: Secondary | ICD-10-CM | POA: Diagnosis not present

## 2019-05-22 LAB — POCT INR: INR: 2.9 (ref 2.0–3.0)

## 2019-05-22 NOTE — Patient Instructions (Signed)
Patient instructed to take medications as defined in the Anti-coagulation Track section of this encounter.  Patient instructed to take today's dose.  Patient instructed to take 1/2 tablet of your 4mg  blue colored warfarin tablets by mouth once-daily--EXCEPT on TUESDAYS, OMIT doses on ALL TUESDAYS.   Patient verbalized understanding of these instructions.

## 2019-05-22 NOTE — Progress Notes (Signed)
Anticoagulation Management Pamela [name] is a 73 y.o. female who reports to the clinic for monitoring of warfarin treatment.    Indication: DVT, history of; secondary hypercoagulable state; current long-term use of anticoagulant  Duration: indefinite Supervising physician: [facility] Visit History: Patient does not report signs/symptoms of bleeding or thromboembolism. Other recent changes: No diet, medications, lifestyle endorsed by the patient. Anticoagulation Episode Summary    Current INR goal:  2.0-3.0  TTR:  69.5 % (8.7 y)  Next INR check:  06/19/2019  INR from last check:  2.9 (05/22/2019)  Weekly max warfarin dose:    Target end date:  Indefinite  INR check location:  Anticoagulation Clinic  Preferred lab:    Send INR reminders to:  ANTICOAG IMP   Indications   Secondary hypercoagulable state ([place]) [D68.69] DVT HX OF (Resolved) [id number]       Comments:        Anticoagulation Care Providers    Provider Role Specialty Phone number   Burman Freestone, MD  Internal Medicine [phone]      Allergies  Allergen Reactions  . Ace Inhibitors Cough    Current Outpatient Medications:  .  cephALEXin (KEFLEX) 500 MG capsule, Take 1 capsule (500 mg total) by mouth 4 (four) times daily. (Patient not taking: Reported on 05/01/2019), Disp: 40 capsule, Rfl: 0 .  colestipol (COLESTID) 1 g tablet, TAKE 10 TABLETS BY MOUTH EVERY DAY, Disp: 900 tablet, Rfl: 3 .  fluticasone (FLONASE) 50 MCG/ACT nasal spray, Place 2 sprays into both nostrils daily. (Patient not taking: Reported on 05/01/2019), Disp: 1 g, Rfl: 0 .  glucosamine-chondroitin 500-400 MG tablet, Take 1 tablet by mouth 3 (three) times daily., Disp: , Rfl:  .  levothyroxine (SYNTHROID) 88 MCG tablet, TAKE ONE (1) TABLET BY MOUTH EVERY DAY, Disp: 90 tablet, Rfl: 3 .  losartan (COZAAR) 25 MG tablet, TAKE ONE (1) TABLET BY MOUTH EVERY DAY, Disp: 90 tablet, Rfl: 3 .  Multiple  Vitamins-Calcium (DAILY COMBO MULTIVITS/CALCIUM) TABS, Take 1 tablet by mouth daily.  , Disp: , Rfl:  .  pantoprazole (PROTONIX) 40 MG tablet, TAKE ONE (1) TABLET BY MOUTH EVERY DAY, Disp: 90 tablet, Rfl: 3 .  warfarin (COUMADIN) 4 MG tablet, Take one tablet on Mon / Wed / Fri. Take one-half tablet all other days. Dose will change frequently., Disp: 65 tablet, Rfl: 11 .  warfarin (COUMADIN) 4 MG tablet, TAKE 1 TABLET BY MOUTH ON MONDAYS AND FRIDAYS.  TAKE 1/2 A TABLET BY MOUTH ALL OTHER DAYS. (Patient not taking: Reported on 05/01/2019), Disp: 65 tablet, Rfl: 11 .  warfarin (COUMADIN) 4 MG tablet, TAKE ONE TABLET BY MOUTH ON MONDAYS AND FRIDAYS AND TAKE ONE-HALF TABLET ALL OTHER DAYS (Patient not taking: Reported on 05/01/2019), Disp: 65 tablet, Rfl: 11  Current Facility-Administered Medications:  .  cyanocobalamin ((VITAMIN B-12)) injection 1,000 mcg, 1,000 mcg, Intramuscular, Q30 days, Bartholomew Crews, MD, 1,000 mcg at 08/01/18 [id number] Past Medical History:  Diagnosis Date  . CHOLELITHIASIS, WITH OBSTRUCTION 04/21/2006   s/p ERCP,sprincterotomy, stent ([facility])  . 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 GLAUCOMA 04/24/2008  . HYPERLIPIDEMIA 06/03/2006  . Hypothyroidism lifelong  . OBESITY, MILD 04/24/2008  . Tenosynovitis 01/2004   Sypher   Social History   Socioeconomic History  . Marital status: Widowed    Spouse name: Not on file  . Number of children: Not on file  . Years of education: Not on file  .  Highest education level: Not on file  Occupational History  . 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 Financial resource strain: Not on file  . Food insecurity    Worry: Not on file    Inability: Not on file  . Transportation needs    Medical: Not on file    Non-medical: Not on file  Tobacco Use  . Smoking status: Former Smoker    Packs/day: 1.00    Years: 20.00     Pack years: 20.00    Quit date: 07/06/1985    Years since quitting: 33.8  . Smokeless tobacco: Former Systems developer  . Tobacco comment: Quit 1988  Substance and Sexual Activity  . Alcohol use: Yes    Comment: 2-3 glasses of wine daily.  . Drug use: No  . Sexual activity: Not on file  Lifestyle  . Physical activity    Days per week: Not on file    Minutes per session: Not on file  . Stress: Not on file  Relationships  . Social Herbalist on phone: Not on file    Gets together: Not on file    Attends religious service: Not on file    Active member of club or organization: Not on file    Attends meetings of clubs or organizations: Not on file    Relationship status: Not on file  Other Topics Concern  . Not on file  Social History Narrative   Married, no regular exercise.   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    Family History  Problem Relation Age of Onset  . Cancer Mother        H&N, smoker  . Cancer Sister        lung, 2011, smoker  . Other Other        grandmother had mastectomy in her 47's unknown reason  . Breast cancer Neg Hx     ASSESSMENT Recent Results: The most recent result is correlated with 12 mg per week: Lab Results  Component Value Date   INR 2.9 05/22/2019   INR 5.2 (A) 05/01/2019   INR 2.8 03/20/2019    Anticoagulation Dosing: Description   Take 1/2 tablet of your 4mg  blue colored warfarin tablets by mouth once-daily--EXCEPT on TUESDAYS, OMIT doses on ALL TUESDAYS.        INR today: Therapeutic  PLAN Weekly dose was unchanged.  Patient Instructions  Patient instructed to take medications as defined in the Anti-coagulation Track section of this encounter.  Patient instructed to take today's dose.  Patient instructed to take 1/2 tablet of your 4mg  blue colored warfarin tablets by mouth once-daily--EXCEPT on TUESDAYS, OMIT doses on ALL TUESDAYS.    Patient verbalized understanding of these instructions.    Patient advised to contact clinic or seek medical attention if signs/symptoms of bleeding or thromboembolism occur.  Patient verbalized understanding by repeating back information and was advised to contact me if further medication-related questions arise. Patient was also provided an information handout.  Follow-up Return in 4 weeks (on 06/19/2019) for Follow up INR at 10:00.  Cristela Felt, PharmD PGY1 Pharmacy Resident [employer]: [phone]  15 minutes spent face-to-face with the patient during the encounter. 50% of time spent on education, including signs/sx bleeding and clotting, as well as food and drug  interactions with warfarin. 50% of time was spent on fingerprick POC INR sample collection,processing, results determination, and documentation in [email or web address].[email or web address]/.

## 2019-06-05 ENCOUNTER — Encounter: Payer: Self-pay | Admitting: Internal Medicine

## 2019-06-05 ENCOUNTER — Other Ambulatory Visit: Payer: Self-pay | Admitting: Internal Medicine

## 2019-06-05 DIAGNOSIS — R310 Gross hematuria: Secondary | ICD-10-CM

## 2019-06-05 NOTE — Progress Notes (Signed)
I called patient.  No dysuria, no urinary frequency, no flank pain.  Certain it is from urine and not vaginal.  Father had a painless bladder stone that was the size of a golf ball and had to be removed.  We will start with UA which I have ordered for the 14th when she comes for warfarin clinic.

## 2019-06-05 NOTE — Progress Notes (Signed)
Appt has been sch on 06/19/2019 with the LAB

## 2019-06-13 ENCOUNTER — Other Ambulatory Visit: Payer: Self-pay

## 2019-06-13 ENCOUNTER — Telehealth: Payer: Medicare Other | Admitting: Physician Assistant

## 2019-06-13 ENCOUNTER — Encounter: Payer: Self-pay | Admitting: Internal Medicine

## 2019-06-13 ENCOUNTER — Other Ambulatory Visit: Payer: Medicare Other

## 2019-06-13 ENCOUNTER — Ambulatory Visit (INDEPENDENT_AMBULATORY_CARE_PROVIDER_SITE_OTHER): Payer: Medicare Other | Admitting: Internal Medicine

## 2019-06-13 VITALS — BP 155/86 | HR 105 | Temp 98.0°F | Ht 64.0 in | Wt 192.9 lb

## 2019-06-13 DIAGNOSIS — R319 Hematuria, unspecified: Secondary | ICD-10-CM

## 2019-06-13 DIAGNOSIS — Z5181 Encounter for therapeutic drug level monitoring: Secondary | ICD-10-CM

## 2019-06-13 DIAGNOSIS — I10 Essential (primary) hypertension: Secondary | ICD-10-CM | POA: Diagnosis not present

## 2019-06-13 DIAGNOSIS — R310 Gross hematuria: Secondary | ICD-10-CM | POA: Diagnosis not present

## 2019-06-13 DIAGNOSIS — Z79899 Other long term (current) drug therapy: Secondary | ICD-10-CM

## 2019-06-13 DIAGNOSIS — R300 Dysuria: Secondary | ICD-10-CM

## 2019-06-13 DIAGNOSIS — D6869 Other thrombophilia: Secondary | ICD-10-CM

## 2019-06-13 DIAGNOSIS — Z7901 Long term (current) use of anticoagulants: Secondary | ICD-10-CM

## 2019-06-13 LAB — POCT URINALYSIS DIPSTICK
Glucose, UA: POSITIVE — AB
Nitrite, UA: NEGATIVE
Protein, UA: POSITIVE — AB
Spec Grav, UA: <=1.005 — AB (ref 1.010–1.025)
Urobilinogen, UA: >=8 E.U./dL — AB
pH, UA: 8.5 — AB (ref 5.0–8.0)

## 2019-06-13 LAB — POCT INR: INR: 3.1 — AB (ref 2.0–3.0)

## 2019-06-13 MED ORDER — NITROFURANTOIN MONOHYD MACRO 100 MG PO CAPS: 100.0000 mg | ORAL_CAPSULE | Freq: Two times a day (BID) | ORAL | 0 refills | Status: AC

## 2019-06-13 NOTE — Assessment & Plan Note (Signed)
Secondary hypercoagulable state: Patient is on low-dose Warfarin with her most recent POC INR 2.9.1 up from 5.2 the month prior.  Plan:  Will forward this note to Dr. Elie Confer her anticoagulation pharmacist for discussion regarding appropriateness of Warfarin dosing changes given the antibiotic prescription.

## 2019-06-13 NOTE — Addendum Note (Signed)
Addended by: Orson Gear on: 06/13/2019 03:23 PM   Modules accepted: Orders

## 2019-06-13 NOTE — Patient Instructions (Addendum)
FOLLOW-UP INSTRUCTIONS When: As needed  I have provided a prescription for an antibiotic to the pharmacy. Please pick this up and start taking it. I will contact Dr. Elie Confer and see if he can call you to discuss any potentially needed changes to your warfarin dosing.  I have placed an urgent referral to Urology. Please make certain to follow-up with them. If you are unable to see them in the next 1 to 2 weeks or symptoms worsen please notify us. I will notify you with results of the labs from today and the MA available to me  As always if your symptoms worsen, fail to improve, or you develop other concerning symptoms, please notify our office or visit the local ER if we are unavailable. Symptoms including fever, chills, flank pain, should not be ignored and should encourage you to visit the ED if we are unavailable by phone or the symptoms are severe.  Thank you for your visit to the [facility] today. If you have any questions or concerns please call us at [phone].

## 2019-06-13 NOTE — Progress Notes (Signed)
Based on what you shared with me, I feel your condition warrants further evaluation and I recommend that you be seen for a face to face visit.  Please contact your primary care physician practice to be seen.   Hi Pamela [name],  Thank you for the details you included in the comment boxes. Those details are very helpful in determining the best course of treatment for you and help [place] to provide the best care.  I see your PCP's office has ordered a urine analysis for you on Dec 14.  I recommend calling their office and asking if you can have this done earlier.  You also may need a physical exam or imaging.   A simple UTI typically does not involve a heavy amount of blood in the urine.     Please call their office today.     If you do not have a PCP, [facility] offers a free physician referral service available at [phone]. Our trained staff has the experience, knowledge and resources to put you in touch with a physician who is right for you.   You also have the option of a video visit through [email or web address]  If you are having a true medical emergency please call 911.  NOTE: If you entered your credit card information for this eVisit, you will not be charged. You may see a "hold" on your card for the $35 but that hold will drop off and you will not have a charge processed.  Your e-visit answers were reviewed by a board certified advanced clinical practitioner to complete your personal care plan.  Thank you for using e-Visits.

## 2019-06-13 NOTE — Addendum Note (Signed)
Addended by: Orson Gear on: 06/13/2019 02:12 PM   Modules accepted: Orders

## 2019-06-13 NOTE — Progress Notes (Signed)
   CC: Red urine  HPI:[name] is a 73 y.o. female who presents for evaluation of red urin. Please see individual problem based A/P for details.  Past Medical History:  Diagnosis Date  . CHOLELITHIASIS, WITH OBSTRUCTION 04/21/2006   s/p ERCP,sprincterotomy, stent ([facility])  . 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 GLAUCOMA 04/24/2008  . HYPERLIPIDEMIA 06/03/2006  . Hypothyroidism lifelong  . OBESITY, MILD 04/24/2008  . Tenosynovitis 01/2004   Sypher   Review of Systems:  ROS negative except as per HPI.  Physical Exam: Vitals:   06/13/19 1427  BP: (!) 177/96  Pulse: (!) 111  Temp: 98 F (36.7 C)  TempSrc: Oral  SpO2: 97%  Weight: 192 lb 14.4 oz (87.5 kg)  Height: 5\' 4"  (1.626 m)   General: A/O x4, in no acute distress, afebrile, nondiaphoretic HEENT: PEERL, EMO intact Cardio: RRR, no mrg's  Pulmonary: CTA bilaterally, no wheezing or crackles  Abdomen: Bowel sounds normal, soft, nontender  MSK: BLE nontender, nonedematous Psych: Appropriate affect, not depressed in appearance, engages well  Assessment & Plan:   See Encounters Tab for problem based charting.  Patient discussed with Dr. Daryll Drown

## 2019-06-13 NOTE — Assessment & Plan Note (Signed)
Patient markedly hypertensive today to 177/96.  This is likely secondary to anxiety and stress due to her severe hematuria.  We did not discuss that at length given the other more pressing issue.  We will recheck her BP at her next visit to discuss any potential medication changes as indicated.

## 2019-06-13 NOTE — Assessment & Plan Note (Signed)
Frank Hematuria: Since the day after Thanksgiving.  Intially very lightly red, before.  To clear up. This progressed to frankly red urine a week and a half later. There is associated dysuria today as well. Her initial attempt to produce a urine specimen resulted in splattered blood on the specimen cup. There is no bleeding absent urination. She denied fever, chills, flank pain, abdominal pain, chest pain, or myalgias.  Does not happen in the past.Smoked but stopped 1988. 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 Last full void was this morning after awakening.  We will repeat attempt present sample patient produced frankly hemorrhagic sample for a 50 cc of clotted blood present.  Plan: POC UA positive for Hgb, Bili, protein, glucose which may be due to the large number of RBC's POC INR 3.1 CBC, CMP, Urine culture and UA complete ordered Will plan to treat with 100mg  Nitrofurantoin BID for 5 days given the possible low probability of this being an infectious etiology Placed urgent referral to Urology and will defer imaging consideration to them

## 2019-06-14 LAB — BMP8+ANION GAP
Anion Gap: 18 mmol/L (ref 10.0–18.0)
BUN/Creatinine Ratio: 19 (ref 12–28)
BUN: 15 mg/dL (ref 8–27)
CO2: 22 mmol/L (ref 20–29)
Calcium: 9.8 mg/dL (ref 8.7–10.3)
Chloride: 103 mmol/L (ref 96–106)
Creatinine, Ser: 0.81 mg/dL (ref 0.57–1.00)
GFR calc Af Amer: 82 mL/min/{1.73_m2} (ref >59)
GFR calc non Af Amer: 71 mL/min/{1.73_m2} (ref >59)
Glucose: 93 mg/dL (ref 65–99)
Potassium: 4.7 mmol/L (ref 3.5–5.2)
Sodium: 143 mmol/L (ref 134–144)

## 2019-06-14 LAB — URINALYSIS, COMPLETE
Bilirubin, UA: NEGATIVE
Glucose, UA: NEGATIVE
Ketones, UA: NEGATIVE
Nitrite, UA: POSITIVE — AB
Specific Gravity, UA: 1.023 (ref 1.005–1.030)
Urobilinogen, Ur: 0.2 mg/dL (ref 0.2–1.0)
pH, UA: 5 (ref 5.0–7.5)

## 2019-06-14 LAB — CBC WITH DIFFERENTIAL/PLATELET
Basophils Absolute: 0.1 10*3/uL (ref 0.0–0.2)
Basos: 1 %
EOS (ABSOLUTE): 0.1 10*3/uL (ref 0.0–0.4)
Eos: 1 %
Hematocrit: 41.6 % (ref 34.0–46.6)
Hemoglobin: 13.7 g/dL (ref 11.1–15.9)
Immature Grans (Abs): 0 10*3/uL (ref 0.0–0.1)
Immature Granulocytes: 0 %
Lymphocytes Absolute: 1.7 10*3/uL (ref 0.7–3.1)
Lymphs: 17 %
MCH: 32.9 pg (ref 26.6–33.0)
MCHC: 32.9 g/dL (ref 31.5–35.7)
MCV: 100 fL — ABNORMAL HIGH (ref 79–97)
Monocytes Absolute: 0.9 10*3/uL (ref 0.1–0.9)
Monocytes: 9 %
Neutrophils Absolute: 7 10*3/uL (ref 1.4–7.0)
Neutrophils: 72 %
Platelets: 299 10*3/uL (ref 150–450)
RBC: 4.17 x10E6/uL (ref 3.77–5.28)
RDW: 11.9 % (ref 11.7–15.4)
WBC: 9.7 10*3/uL (ref 3.4–10.8)

## 2019-06-14 LAB — MICROSCOPIC EXAMINATION
Casts: NONE SEEN /lpf
Epithelial Cells (non renal): NONE SEEN /hpf (ref 0–10)
RBC, Urine: >30 /hpf — AB (ref 0–2)
WBC, UA: >30 /hpf — AB (ref 0–5)

## 2019-06-14 NOTE — Progress Notes (Signed)
Internal Medicine Clinic Attending  Case discussed with Dr. Harbrecht at the time of the visit.  We reviewed the resident's history and exam and pertinent patient test results.  I agree with the assessment, diagnosis, and plan of care documented in the resident's note.   

## 2019-06-15 ENCOUNTER — Telehealth: Payer: Self-pay | Admitting: Internal Medicine

## 2019-06-15 LAB — URINE CULTURE

## 2019-06-15 NOTE — Telephone Encounter (Signed)
Called to notify the patient of her lab results. She was glad to hear the normal results of the BMP and CBC. She stated that she continued to have blood in her urine but it has cleared up greatly. She is no longer having burning with urination or other concerning symptoms.  We will need to follow-up on the urine culture results once completed.

## 2019-06-19 ENCOUNTER — Other Ambulatory Visit: Payer: Medicare Other

## 2019-06-19 ENCOUNTER — Other Ambulatory Visit: Payer: Self-pay

## 2019-06-19 ENCOUNTER — Ambulatory Visit (INDEPENDENT_AMBULATORY_CARE_PROVIDER_SITE_OTHER): Payer: Medicare Other | Admitting: Pharmacist

## 2019-06-19 DIAGNOSIS — D6869 Other thrombophilia: Secondary | ICD-10-CM | POA: Diagnosis not present

## 2019-06-19 DIAGNOSIS — Z5181 Encounter for therapeutic drug level monitoring: Secondary | ICD-10-CM

## 2019-06-19 DIAGNOSIS — Z86718 Personal history of other venous thrombosis and embolism: Secondary | ICD-10-CM | POA: Diagnosis not present

## 2019-06-19 DIAGNOSIS — Z7901 Long term (current) use of anticoagulants: Secondary | ICD-10-CM

## 2019-06-19 LAB — POCT INR: INR: 3.3 — AB (ref 2.0–3.0)

## 2019-06-19 NOTE — Progress Notes (Signed)
Anticoagulation Management Pamela [name] is a 73 y.o. female who reports to the clinic for monitoring of warfarin treatment.    Indication: Primary hypercoagulable state; History of VTE. Long term current use  of anticoagulant.    Duration: indefinite Supervising physician: Joni Reining  Anticoagulation Clinic Visit History: Patient does not report signs/symptoms of bleeding or thromboembolism  Other recent changes: No diet, medications, lifestyle except as documented in patient findings.  Anticoagulation Episode Summary    Current INR goal:  2.0-3.0  TTR:  69.3 % (8.8 y)  Next INR check:  07/03/2019  INR from last check:  3.3 (06/19/2019)  Weekly max warfarin dose:    Target end date:  Indefinite  INR check location:  Anticoagulation Clinic  Preferred lab:    Send INR reminders to:  [facility]   Indications   Secondary hypercoagulable state ([place]) [D68.69] DVT HX OF (Resolved) [id number]       Comments:        Anticoagulation Care Providers    Provider Role Specialty Phone number   Burman Freestone, MD  Internal Medicine [phone]      Allergies  Allergen Reactions  . Ace Inhibitors Cough    Current Outpatient Medications:  .  cephALEXin (KEFLEX) 500 MG capsule, Take 1 capsule (500 mg total) by mouth 4 (four) times daily., Disp: 40 capsule, Rfl: 0 .  colestipol (COLESTID) 1 g tablet, TAKE 10 TABLETS BY MOUTH EVERY DAY, Disp: 900 tablet, Rfl: 3 .  fluticasone (FLONASE) 50 MCG/ACT nasal spray, Place 2 sprays into both nostrils daily., Disp: 1 g, Rfl: 0 .  glucosamine-chondroitin 500-400 MG tablet, Take 1 tablet by mouth 3 (three) times daily., Disp: , Rfl:  .  levothyroxine (SYNTHROID) 88 MCG tablet, TAKE ONE (1) TABLET BY MOUTH EVERY DAY, Disp: 90 tablet, Rfl: 3 .  losartan (COZAAR) 25 MG tablet, TAKE ONE (1) TABLET BY MOUTH EVERY DAY, Disp: 90 tablet, Rfl: 3 .  Multiple Vitamins-Calcium (DAILY COMBO MULTIVITS/CALCIUM) TABS, Take 1 tablet by mouth daily.   , Disp: , Rfl:  .  pantoprazole (PROTONIX) 40 MG tablet, TAKE ONE (1) TABLET BY MOUTH EVERY DAY, Disp: 90 tablet, Rfl: 3 .  warfarin (COUMADIN) 4 MG tablet, Take one tablet on Mon / Wed / Fri. Take one-half tablet all other days. Dose will change frequently., Disp: 65 tablet, Rfl: 11 .  warfarin (COUMADIN) 4 MG tablet, TAKE 1 TABLET BY MOUTH ON MONDAYS AND FRIDAYS.  TAKE 1/2 A TABLET BY MOUTH ALL OTHER DAYS., Disp: 65 tablet, Rfl: 11 .  warfarin (COUMADIN) 4 MG tablet, TAKE ONE TABLET BY MOUTH ON MONDAYS AND FRIDAYS AND TAKE ONE-HALF TABLET ALL OTHER DAYS, Disp: 65 tablet, Rfl: 11  Current Facility-Administered Medications:  .  cyanocobalamin ((VITAMIN B-12)) injection 1,000 mcg, 1,000 mcg, Intramuscular, Q30 days, Bartholomew Crews, MD, 1,000 mcg at 08/01/18 [id number] Past Medical History:  Diagnosis Date  . CHOLELITHIASIS, WITH OBSTRUCTION 04/21/2006   s/p ERCP,sprincterotomy, stent ([facility])  . 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 GLAUCOMA 04/24/2008  . HYPERLIPIDEMIA 06/03/2006  . Hypothyroidism lifelong  . OBESITY, MILD 04/24/2008  . Tenosynovitis 01/2004   Sypher   Social History   Socioeconomic History  . Marital status: Widowed    Spouse name: Not on file  . Number of children: Not on file  . Years of education: Not on file  . Highest education level: Not on file  Occupational History  . 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 Smoking status: Former Smoker    Packs/day: 1.00    Years: 20.00    Pack years: 20.00    Quit date: 07/06/1985    Years since quitting: 33.9  . Smokeless tobacco: Former Systems developer  . Tobacco comment: Quit 1988  Substance and Sexual Activity  . Alcohol use: Yes    Comment: 2-3 glasses of wine daily.  . Drug use: No  . Sexual activity: Not on file  Other Topics Concern  . Not on file  Social History Narrative   Married, no regular  exercise.   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    Social Determinants of Health   Financial Resource Strain:   . Difficulty of Paying Living Expenses: Not on file  Food Insecurity:   . Worried About Charity fundraiser in the Last Year: Not on file  . Ran Out of Food in the Last Year: Not on file  Transportation Needs:   . Lack of Transportation (Medical): Not on file  . Lack of Transportation (Non-Medical): Not on file  Physical Activity:   . Days of Exercise per Week: Not on file  . Minutes of Exercise per Session: Not on file  Stress:   . Feeling of Stress : Not on file  Social Connections:   . Frequency of Communication with Friends and Family: Not on file  . Frequency of Social Gatherings with Friends and Family: Not on file  . Attends Religious Services: Not on file  . Active Member of Clubs or Organizations: Not on file  . Attends Archivist Meetings: Not on file  . Marital Status: Not on file   Family History  Problem Relation Age of Onset  . Cancer Mother        H&N, smoker  . Cancer Sister        lung, 2011, smoker  . Other Other        grandmother had mastectomy in her 64's unknown reason  . Breast cancer Neg Hx     ASSESSMENT Recent Results: The most recent result is correlated with 12 mg per week: Lab Results  Component Value Date   INR 3.3 (A) 06/19/2019   INR 3.1 (A) 06/13/2019   INR 2.9 05/22/2019    Anticoagulation Dosing: Description   Take 1/2 tablet of your 4mg  blue colored warfarin tablets by mouth once-daily--EXCEPT on TUESDAYS and FRIDAYS,  OMIT doses on ALL TUESDAYS and ALL FRIDAYS.         INR today: Supratherapeutic  PLAN Weekly dose was decreased by 15%  to 10 mg per week  Patient Instructions  Patient instructed to take medications as defined in the Anti-coagulation Track section of this encounter.  Patient instructed to take  today's dose.  Patient instructed to take 1/2 tablet of your 4mg  strength-blue warfarin tablet on all days of the week--EXCEPT on Tuesdays and Fridays--DO NOT TAKE ANY WARFARIN on TUESDAYS and FRIDAYS.  Patient verbalized understanding of these instructions.    Patient advised to contact clinic or seek medical attention if signs/symptoms of bleeding or thromboembolism occur.  Patient verbalized understanding by repeating back information and was advised to contact me if further medication-related questions arise. Patient was also provided an information handout.  Follow-up Return in 2 weeks (on  07/03/2019) for Follow up INR.  Pennie Banter, PharmD, CPP  15 minutes spent face-to-face with the patient during the encounter. 50% of time spent on education, including signs/sx bleeding and clotting, as well as food and drug interactions with warfarin. 50% of time was spent on fingerprick POC INR sample collection,processing, results determination, and documentation in [email or web address].[email or web address]/.

## 2019-06-19 NOTE — Patient Instructions (Signed)
Patient instructed to take medications as defined in the Anti-coagulation Track section of this encounter.  Patient instructed to take today's dose.  Patient instructed to take 1/2 tablet of your 4mg  strength-blue warfarin tablet on all days of the week--EXCEPT on Tuesdays and Fridays--DO NOT TAKE ANY WARFARIN on TUESDAYS and FRIDAYS.  Patient verbalized understanding of these instructions.

## 2019-07-03 ENCOUNTER — Other Ambulatory Visit: Payer: Self-pay | Admitting: Internal Medicine

## 2019-07-03 ENCOUNTER — Ambulatory Visit (INDEPENDENT_AMBULATORY_CARE_PROVIDER_SITE_OTHER): Payer: Medicare Other | Admitting: Pharmacist

## 2019-07-03 DIAGNOSIS — Z86718 Personal history of other venous thrombosis and embolism: Secondary | ICD-10-CM | POA: Diagnosis not present

## 2019-07-03 DIAGNOSIS — Z5181 Encounter for therapeutic drug level monitoring: Secondary | ICD-10-CM

## 2019-07-03 DIAGNOSIS — Z7901 Long term (current) use of anticoagulants: Secondary | ICD-10-CM | POA: Diagnosis not present

## 2019-07-03 DIAGNOSIS — R310 Gross hematuria: Secondary | ICD-10-CM

## 2019-07-03 DIAGNOSIS — D6869 Other thrombophilia: Secondary | ICD-10-CM | POA: Diagnosis not present

## 2019-07-03 LAB — POCT INR: INR: 2.2 (ref 2.0–3.0)

## 2019-07-03 NOTE — Patient Instructions (Signed)
Patient instructed to take medications as defined in the Anti-coagulation Track section of this encounter.  Patient instructed to take today's dose.  Patient instructed to take 1/2 tablet of your 4mg  blue colored warfarin tablets by mouth once-daily--EXCEPT on TUESDAYS and FRIDAYS,  OMIT doses on ALL TUESDAYS and ALL FRIDAYS.   Patient verbalized understanding of these instructions.

## 2019-07-03 NOTE — Addendum Note (Signed)
Addended by: Truddie Crumble on: 07/03/2019 12:13 PM   Modules accepted: Orders

## 2019-07-03 NOTE — Progress Notes (Signed)
INTERNAL MEDICINE TEACHING ATTENDING ADDENDUM ° °I agree with pharmacy recommendations as outlined in their note.  ° °-Lene Mckay MD ° °

## 2019-07-03 NOTE — Progress Notes (Signed)
UA obtained today to confirm resolution of her hemoglobinuria.

## 2019-07-03 NOTE — Addendum Note (Signed)
Addended by: Truddie Crumble on: 07/03/2019 12:14 PM   Modules accepted: Orders

## 2019-07-03 NOTE — Progress Notes (Signed)
Anticoagulation Management Pamela [name] is a 73 y.o. female who reports to the clinic for monitoring of warfarin treatment.    Indication: Secondary hypercoagulable state; History of VTE (resolved); long term current use of anticoagulant.    Duration: indefinite Supervising physician: Lalla Brothers  Anticoagulation Clinic Visit History: Patient does report signs/symptoms of bleeding or thromboembolism as described in the patient findings.  Other recent changes: No diet, medications, lifestyle changes endorsed except as noted in patient findings.  Anticoagulation Episode Summary    Current INR goal:  2.0-3.0  TTR:  69.3 % (8.8 y)  Next INR check:  07/31/2019  INR from last check:    Weekly max warfarin dose:    Target end date:  Indefinite  INR check location:  Anticoagulation Clinic  Preferred lab:    Send INR reminders to:  [facility]   Indications   Secondary hypercoagulable state ([place]) [D68.69] DVT HX OF (Resolved) [id number]       Comments:        Anticoagulation Care Providers    Provider Role Specialty Phone number   Burman Freestone, MD  Internal Medicine [phone]      Allergies  Allergen Reactions  . Ace Inhibitors Cough    Current Outpatient Medications:  .  cephALEXin (KEFLEX) 500 MG capsule, Take 1 capsule (500 mg total) by mouth 4 (four) times daily., Disp: 40 capsule, Rfl: 0 .  colestipol (COLESTID) 1 g tablet, TAKE 10 TABLETS BY MOUTH EVERY DAY, Disp: 900 tablet, Rfl: 3 .  fluticasone (FLONASE) 50 MCG/ACT nasal spray, Place 2 sprays into both nostrils daily., Disp: 1 g, Rfl: 0 .  glucosamine-chondroitin 500-400 MG tablet, Take 1 tablet by mouth 3 (three) times daily., Disp: , Rfl:  .  levothyroxine (SYNTHROID) 88 MCG tablet, TAKE ONE (1) TABLET BY MOUTH EVERY DAY, Disp: 90 tablet, Rfl: 3 .  losartan (COZAAR) 25 MG tablet, TAKE ONE (1) TABLET BY MOUTH EVERY DAY, Disp: 90 tablet, Rfl: 3 .  Multiple Vitamins-Calcium (DAILY COMBO  MULTIVITS/CALCIUM) TABS, Take 1 tablet by mouth daily.  , Disp: , Rfl:  .  pantoprazole (PROTONIX) 40 MG tablet, TAKE ONE (1) TABLET BY MOUTH EVERY DAY, Disp: 90 tablet, Rfl: 3 .  warfarin (COUMADIN) 4 MG tablet, Take one tablet on Mon / Wed / Fri. Take one-half tablet all other days. Dose will change frequently., Disp: 65 tablet, Rfl: 11 .  warfarin (COUMADIN) 4 MG tablet, TAKE 1 TABLET BY MOUTH ON MONDAYS AND FRIDAYS.  TAKE 1/2 A TABLET BY MOUTH ALL OTHER DAYS., Disp: 65 tablet, Rfl: 11 .  warfarin (COUMADIN) 4 MG tablet, TAKE ONE TABLET BY MOUTH ON MONDAYS AND FRIDAYS AND TAKE ONE-HALF TABLET ALL OTHER DAYS, Disp: 65 tablet, Rfl: 11  Current Facility-Administered Medications:  .  cyanocobalamin ((VITAMIN B-12)) injection 1,000 mcg, 1,000 mcg, Intramuscular, Q30 days, Bartholomew Crews, MD, 1,000 mcg at 08/01/18 [id number] Past Medical History:  Diagnosis Date  . CHOLELITHIASIS, WITH OBSTRUCTION 04/21/2006   s/p ERCP,sprincterotomy, stent ([facility])  . 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 GLAUCOMA 04/24/2008  . HYPERLIPIDEMIA 06/03/2006  . Hypothyroidism lifelong  . OBESITY, MILD 04/24/2008  . Tenosynovitis 01/2004   Sypher   Social History   Socioeconomic History  . Marital status: Widowed    Spouse name: Not on file  . Number of children: Not on file  . Years of education: Not on file  . Highest education level: Not on file  Occupational  History  . 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 Smoking status: Former Smoker    Packs/day: 1.00    Years: 20.00    Pack years: 20.00    Quit date: 07/06/1985    Years since quitting: 34.0  . Smokeless tobacco: Former Systems developer  . Tobacco comment: Quit 1988  Substance and Sexual Activity  . Alcohol use: Yes    Comment: 2-3 glasses of wine daily.  . Drug use: No  . Sexual activity: Not on file  Other Topics Concern  . Not on file   Social History Narrative   Married, no regular exercise.   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    Social Determinants of Health   Financial Resource Strain:   . Difficulty of Paying Living Expenses: Not on file  Food Insecurity:   . Worried About Charity fundraiser in the Last Year: Not on file  . Ran Out of Food in the Last Year: Not on file  Transportation Needs:   . Lack of Transportation (Medical): Not on file  . Lack of Transportation (Non-Medical): Not on file  Physical Activity:   . Days of Exercise per Week: Not on file  . Minutes of Exercise per Session: Not on file  Stress:   . Feeling of Stress : Not on file  Social Connections:   . Frequency of Communication with Friends and Family: Not on file  . Frequency of Social Gatherings with Friends and Family: Not on file  . Attends Religious Services: Not on file  . Active Member of Clubs or Organizations: Not on file  . Attends Archivist Meetings: Not on file  . Marital Status: Not on file   Family History  Problem Relation Age of Onset  . Cancer Mother        H&N, smoker  . Cancer Sister        lung, 2011, smoker  . Other Other        grandmother had mastectomy in her 48's unknown reason  . Breast cancer Neg Hx     ASSESSMENT Recent Results: The most recent result is correlated with 10 mg per week: Lab Results  Component Value Date   INR 2.2 07/03/2019   INR 3.3 (A) 06/19/2019   INR 3.1 (A) 06/13/2019    Anticoagulation Dosing: Description   Take 1/2 tablet of your 4mg  blue colored warfarin tablets by mouth once-daily--EXCEPT on TUESDAYS and FRIDAYS,  OMIT doses on ALL TUESDAYS and ALL FRIDAYS.         INR today: Therapeutic  PLAN Weekly dose was unchanged. Discussed with Dr. Evette Doffing. Will perform UA to determine if hematuria still present. Patient has an appointment with [facility], [facility] on  21-JAN-21.   Patient Instructions  Patient instructed to take medications as defined in the Anti-coagulation Track section of this encounter.  Patient instructed to take today's dose.  Patient instructed to take 1/2 tablet of your 4mg  blue colored warfarin tablets by mouth once-daily--EXCEPT on TUESDAYS and FRIDAYS,  OMIT doses on ALL TUESDAYS and ALL FRIDAYS.   Patient verbalized understanding of these instructions.    Patient advised to contact clinic or seek medical attention if signs/symptoms of bleeding or thromboembolism occur.  Patient verbalized understanding by repeating back information and was advised  to contact me if further medication-related questions arise. Patient was also provided an information handout.  Follow-up Return in 4 weeks (on 07/31/2019) for Follow up INR.  Pennie Banter, PharmD, CPP  15 minutes spent face-to-face with the patient during the encounter. 50% of time spent on education, including signs/sx bleeding and clotting, as well as food and drug interactions with warfarin. 50% of time was spent on fingerprick POC INR sample collection,processing, results determination, and documentation in [email or web address].[email or web address]/.

## 2019-07-04 LAB — URINALYSIS, ROUTINE W REFLEX MICROSCOPIC
Bilirubin, UA: NEGATIVE
Glucose, UA: NEGATIVE
Ketones, UA: NEGATIVE
Nitrite, UA: NEGATIVE
Specific Gravity, UA: 1.022 (ref 1.005–1.030)
Urobilinogen, Ur: 0.2 mg/dL (ref 0.2–1.0)
pH, UA: 5 (ref 5.0–7.5)

## 2019-07-04 LAB — MICROSCOPIC EXAMINATION
RBC, Urine: >30 /hpf — AB (ref 0–2)
WBC, UA: >30 /hpf — AB (ref 0–5)

## 2019-07-05 ENCOUNTER — Telehealth: Payer: Self-pay | Admitting: Internal Medicine

## 2019-07-05 NOTE — Telephone Encounter (Signed)
I called to notify the patient the results of her urinalysis.  I informed her that there remained RBCs present indicating significant hemoglobinuria and assess that further evaluation with CT renal study versus urology is recommended.  She stated that she had called and scheduled an appointment with urology for later in January.  I informed her that we could consider CT renal study imaging as an alternative to urology.  She wishes to pursue urology evaluation initially.

## 2019-07-18 ENCOUNTER — Other Ambulatory Visit: Payer: Self-pay | Admitting: Internal Medicine

## 2019-07-29 ENCOUNTER — Encounter: Payer: Self-pay | Admitting: Internal Medicine

## 2019-07-31 ENCOUNTER — Ambulatory Visit (INDEPENDENT_AMBULATORY_CARE_PROVIDER_SITE_OTHER): Payer: Medicare Other | Admitting: Pharmacist

## 2019-07-31 ENCOUNTER — Other Ambulatory Visit: Payer: Self-pay

## 2019-07-31 DIAGNOSIS — Z5181 Encounter for therapeutic drug level monitoring: Secondary | ICD-10-CM

## 2019-07-31 DIAGNOSIS — D6869 Other thrombophilia: Secondary | ICD-10-CM

## 2019-07-31 DIAGNOSIS — Z7901 Long term (current) use of anticoagulants: Secondary | ICD-10-CM

## 2019-07-31 DIAGNOSIS — Z86718 Personal history of other venous thrombosis and embolism: Secondary | ICD-10-CM | POA: Diagnosis not present

## 2019-07-31 LAB — POCT INR: INR: 2.1 (ref 2.0–3.0)

## 2019-07-31 NOTE — Progress Notes (Signed)
Anticoagulation Management Pamela [name] is a 74 y.o. female who reports to the clinic for monitoring of warfarin treatment.    Indication: DVT, History of (resolved); Secondary hypercoagulable state; Long term current use of anticoagulant.   Duration: indefinite Supervising physician: Gilles Chiquito  Anticoagulation Clinic Visit History: Patient does not report signs/symptoms of bleeding or thromboembolism at present time. Other recent changes: No diet, medications, lifestyle changes.  Anticoagulation Episode Summary    Current INR goal:  2.0-3.0  TTR:  69.6 % (8.9 y)  Next INR check:  08/21/2019  INR from last check:  2.1 (07/31/2019)  Weekly max warfarin dose:    Target end date:  Indefinite  INR check location:  Anticoagulation Clinic  Preferred lab:    Send INR reminders to:  [facility]   Indications   Secondary hypercoagulable state ([place]) [D68.69] DVT HX OF (Resolved) [id number]       Comments:        Anticoagulation Care Providers    Provider Role Specialty Phone number   Burman Freestone, MD  Internal Medicine [phone]      Allergies  Allergen Reactions  . Ace Inhibitors Cough    Current Outpatient Medications:  .  colestipol (COLESTID) 1 g tablet, TAKE 10 TABLETS BY MOUTH EVERY DAY, Disp: 900 tablet, Rfl: 3 .  glucosamine-chondroitin 500-400 MG tablet, Take 1 tablet by mouth 3 (three) times daily., Disp: , Rfl:  .  levothyroxine (SYNTHROID) 88 MCG tablet, TAKE ONE (1) TABLET BY MOUTH EVERY DAY, Disp: 90 tablet, Rfl: 3 .  losartan (COZAAR) 25 MG tablet, TAKE ONE (1) TABLET BY MOUTH EVERY DAY, Disp: 90 tablet, Rfl: 3 .  Multiple Vitamins-Calcium (DAILY COMBO MULTIVITS/CALCIUM) TABS, Take 1 tablet by mouth daily.  , Disp: , Rfl:  .  pantoprazole (PROTONIX) 40 MG tablet, TAKE ONE (1) TABLET BY MOUTH EVERY DAY, Disp: 90 tablet, Rfl: 3 .  warfarin (COUMADIN) 4 MG tablet, Take one tablet on Mon / Wed / Fri. Take one-half tablet all other days. Dose will  change frequently., Disp: 65 tablet, Rfl: 11 .  warfarin (COUMADIN) 4 MG tablet, TAKE 1 TABLET BY MOUTH ON MONDAYS AND FRIDAYS.  TAKE 1/2 A TABLET BY MOUTH ALL OTHER DAYS., Disp: 65 tablet, Rfl: 11 .  warfarin (COUMADIN) 4 MG tablet, TAKE ONE TABLET BY MOUTH ON MONDAYS AND FRIDAYS AND TAKE ONE-HALF TABLET ALL OTHER DAYS, Disp: 65 tablet, Rfl: 11 .  cephALEXin (KEFLEX) 500 MG capsule, Take 1 capsule (500 mg total) by mouth 4 (four) times daily., Disp: 40 capsule, Rfl: 0 .  fluticasone (FLONASE) 50 MCG/ACT nasal spray, Place 2 sprays into both nostrils daily., Disp: 1 g, Rfl: 0  Current Facility-Administered Medications:  .  cyanocobalamin ((VITAMIN B-12)) injection 1,000 mcg, 1,000 mcg, Intramuscular, Q30 days, Bartholomew Crews, MD, 1,000 mcg at 08/01/18 [id number] Past Medical History:  Diagnosis Date  . CHOLELITHIASIS, WITH OBSTRUCTION 04/21/2006   s/p ERCP,sprincterotomy, stent ([facility])  . 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 GLAUCOMA 04/24/2008  . HYPERLIPIDEMIA 06/03/2006  . Hypothyroidism lifelong  . OBESITY, MILD 04/24/2008  . Tenosynovitis 01/2004   Sypher   Social History   Socioeconomic History  . Marital status: Widowed    Spouse name: Not on file  . Number of children: Not on file  . Years of education: Not on file  . Highest education level: Not on file  Occupational History  . Occupation: Engineer, maintenance  Employer: [employer]    Comment: Retired in 2011  Tobacco Use  . Smoking status: Former Smoker    Packs/day: 1.00    Years: 20.00    Pack years: 20.00    Quit date: 07/06/1985    Years since quitting: 34.0  . Smokeless tobacco: Former Systems developer  . Tobacco comment: Quit 1988  Substance and Sexual Activity  . Alcohol use: Yes    Comment: 2-3 glasses of wine daily.  . Drug use: No  . Sexual activity: Not on file  Other Topics Concern  . Not on file  Social History Narrative   Married, no regular exercise.    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    Social Determinants of Health   Financial Resource Strain:   . Difficulty of Paying Living Expenses: Not on file  Food Insecurity:   . Worried About Charity fundraiser in the Last Year: Not on file  . Ran Out of Food in the Last Year: Not on file  Transportation Needs:   . Lack of Transportation (Medical): Not on file  . Lack of Transportation (Non-Medical): Not on file  Physical Activity:   . Days of Exercise per Week: Not on file  . Minutes of Exercise per Session: Not on file  Stress:   . Feeling of Stress : Not on file  Social Connections:   . Frequency of Communication with Friends and Family: Not on file  . Frequency of Social Gatherings with Friends and Family: Not on file  . Attends Religious Services: Not on file  . Active Member of Clubs or Organizations: Not on file  . Attends Archivist Meetings: Not on file  . Marital Status: Not on file   Family History  Problem Relation Age of Onset  . Cancer Mother        H&N, smoker  . Cancer Sister        lung, 2011, smoker  . Other Other        grandmother had mastectomy in her 44's unknown reason  . Breast cancer Neg Hx     ASSESSMENT Recent Results: The most recent result is correlated with 10 mg per week: Lab Results  Component Value Date   INR 2.1 07/31/2019   INR 2.2 07/03/2019   INR 3.3 (A) 06/19/2019    Anticoagulation Dosing: Description   Take 1/2 tablet of your 4mg  blue colored warfarin tablets by mouth once-daily--EXCEPT on  FRIDAYS,  OMIT doses on ALL FRIDAYS.         INR today: Therapeutic  PLAN Weekly dose was increased by 20% to 12 mg per week  Patient Instructions  Patient instructed to take medications as defined in the Anti-coagulation Track section of this encounter.  Patient instructed to take today's dose.  Patient instructed to take 1/2 tablet of  your 4mg  blue colored warfarin tablets by mouth once-daily--EXCEPT on  FRIDAYS,  OMIT doses on ALL FRIDAYS.  Patient verbalized understanding of these instructions.    Patient advised to contact clinic or seek medical attention if signs/symptoms of bleeding or thromboembolism occur.  Patient verbalized understanding by repeating back information and was advised to contact me if further medication-related questions arise. Patient was also provided an information handout.  Follow-up Return in about 3 weeks (around 08/21/2019) for Follow up INR.  Pennie Banter, PharmD, CPP  15 minutes  spent face-to-face with the patient during the encounter. 50% of time spent on education, including signs/sx bleeding and clotting, as well as food and drug interactions with warfarin. 50% of time was spent on fingerprick POC INR sample collection,processing, results determination, and documentation in http://www.[email or web address]/.

## 2019-07-31 NOTE — Patient Instructions (Signed)
Patient instructed to take medications as defined in the Anti-coagulation Track section of this encounter.  Patient instructed to take today's dose.  Patient instructed to take 1/2 tablet of your 4mg  blue colored warfarin tablets by mouth once-daily--EXCEPT on  FRIDAYS,  OMIT doses on ALL FRIDAYS.  Patient verbalized understanding of these instructions.

## 2019-08-02 ENCOUNTER — Encounter: Payer: Self-pay | Admitting: Internal Medicine

## 2019-08-02 NOTE — Progress Notes (Signed)
I reviewed Dr. Gladstone Pih note.  INR on the low side of goal, coumadin increased.

## 2019-08-11 ENCOUNTER — Encounter: Payer: Self-pay | Admitting: Internal Medicine

## 2019-08-11 DIAGNOSIS — C689 Malignant neoplasm of urinary organ, unspecified: Secondary | ICD-10-CM | POA: Insufficient documentation

## 2019-08-11 DIAGNOSIS — C679 Malignant neoplasm of bladder, unspecified: Secondary | ICD-10-CM | POA: Insufficient documentation

## 2019-08-15 ENCOUNTER — Other Ambulatory Visit: Payer: Self-pay | Admitting: Urology

## 2019-08-16 ENCOUNTER — Encounter: Payer: Self-pay | Admitting: Internal Medicine

## 2019-08-17 ENCOUNTER — Encounter: Payer: Self-pay | Admitting: Internal Medicine

## 2019-08-21 ENCOUNTER — Other Ambulatory Visit: Payer: Self-pay

## 2019-08-21 ENCOUNTER — Ambulatory Visit (INDEPENDENT_AMBULATORY_CARE_PROVIDER_SITE_OTHER): Payer: Medicare Other

## 2019-08-21 DIAGNOSIS — Z5181 Encounter for therapeutic drug level monitoring: Secondary | ICD-10-CM

## 2019-08-21 DIAGNOSIS — Z7901 Long term (current) use of anticoagulants: Secondary | ICD-10-CM | POA: Diagnosis not present

## 2019-08-21 DIAGNOSIS — Z86718 Personal history of other venous thrombosis and embolism: Secondary | ICD-10-CM

## 2019-08-21 DIAGNOSIS — D6869 Other thrombophilia: Secondary | ICD-10-CM

## 2019-08-21 LAB — POCT INR: INR: 3.4 — AB (ref 2.0–3.0)

## 2019-08-21 NOTE — Patient Instructions (Signed)
Patient instructed to take medications as defined in the Anti-coagulation Track section of this encounter.  Patient instructed to not to take tomorrow's dose (patient had already taken today's dose) Patient instructed to begin holding warfarin on 08/22/2019 (patient had already taken today's dose and likely INR could increase tomorrow) in anticipation of procedure on 08/28/2019. Resume previous regimen of warfarin 24-48hrs after procedure on 08/28/2019 at discretion of [facility] (Dr. Gloriann Loan).  Patient verbalized understanding of these instructions.

## 2019-08-21 NOTE — Progress Notes (Signed)
Anticoagulation Management Pamela [name] is a 74 y.o. female who reports to the clinic for monitoring of warfarin treatment.    Indication: hypercoagulable state, history of DVT Duration: indefinite Supervising physician: [facility] Visit History: Patient does report signs/symptoms of bleeding or thromboembolism. Patient endorses blood in urine consistently every day. Color of urine ranges from bright red to pink and is not present on every urination but present daily. Patient is having a transurethral resection followed by intra-bladder chemotherapy dwell for 1 hour on 08/28/2019. No other bleeding reported.  Other recent changes: No diet, medications, lifestyle Anticoagulation Episode Summary    Current INR goal:  2.0-3.0  TTR:  69.6 % (9 y)  Next INR check:  09/04/2019  INR from last check:  3.4 (08/21/2019)  Weekly max warfarin dose:    Target end date:  Indefinite  INR check location:  Anticoagulation Clinic  Preferred lab:    Send INR reminders to:  [facility]   Indications   Secondary hypercoagulable state ([place]) [D68.69] DVT HX OF (Resolved) [Z86.718]       Comments:        Anticoagulation Care Providers    Provider Role Specialty Phone number   Burman Freestone, MD  Internal Medicine [phone]      Allergies  Allergen Reactions   Ace Inhibitors Cough    Current Outpatient Medications:    acetaminophen (TYLENOL) 325 MG tablet, Take 650 mg by mouth every 6 (six) hours as needed for moderate pain or headache., Disp: , Rfl:    Ascorbic Acid (VITAMIN C PO), Take 1 tablet by mouth every evening., Disp: , Rfl:    CALCIUM PO, Take 1 tablet by mouth every evening., Disp: , Rfl:    colestipol (COLESTID) 1 g tablet, TAKE 10 TABLETS BY MOUTH EVERY DAY (Patient taking differently: Take 6 g by mouth daily at 12 noon. ), Disp: 900 tablet, Rfl: 3   levothyroxine (SYNTHROID) 88 MCG tablet, TAKE ONE (1) TABLET BY MOUTH EVERY DAY  (Patient taking differently: Take 88 mcg by mouth daily before breakfast. ), Disp: 90 tablet, Rfl: 3   losartan (COZAAR) 25 MG tablet, TAKE ONE (1) TABLET BY MOUTH EVERY DAY (Patient taking differently: Take 25 mg by mouth daily. ), Disp: 90 tablet, Rfl: 3   Multiple Vitamin (MULTIVITAMIN WITH MINERALS) TABS tablet, Take 1 tablet by mouth daily in the afternoon., Disp: , Rfl:    pantoprazole (PROTONIX) 40 MG tablet, TAKE ONE (1) TABLET BY MOUTH EVERY DAY (Patient taking differently: Take 40 mg by mouth daily. ), Disp: 90 tablet, Rfl: 3   VITAMIN D PO, Take 1 capsule by mouth every evening., Disp: , Rfl:    warfarin (COUMADIN) 4 MG tablet, TAKE ONE TABLET BY MOUTH ON MONDAYS AND FRIDAYS AND TAKE ONE-HALF TABLET ALL OTHER DAYS (Patient taking differently: Take 2 mg by mouth See admin instructions. 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   GLAUCOMA 04/24/2008   HYPERLIPIDEMIA 06/03/2006   Hypothyroidism lifelong   OBESITY, MILD 04/24/2008   Tenosynovitis 01/2004   [place]   Social History  Socioeconomic History   Marital status: Widowed    Spouse name: Not on file   Number of children: Not on file   Years of education: Not on file   Highest education level: Not on file  Occupational History   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   Drug use: No   Sexual activity: Not on file  Other Topics Concern   Not on file  Social History Narrative   Married, no regular exercise.   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 Then worked in [employer] with [employer].    Social Determinants of Health   Financial Resource Strain:    Difficulty of Paying Living Expenses: Not on file  Food Insecurity:    Worried About Charity fundraiser in the Last Year: Not on file   [employer] of Food in the Last Year: Not on file  Transportation Needs:    Lack of Transportation (Medical): Not on file   Lack of Transportation (Non-Medical): Not on file  Physical Activity:    Days of Exercise per Week: Not on file   Minutes of Exercise per Session: Not on file  Stress:    Feeling of Stress : Not on file  Social Connections:    Frequency of Communication with Friends and Family: Not on file   Frequency of Social Gatherings with Friends and Family: Not on file   Attends Religious Services: Not on file   Active Member of Clubs or Organizations: Not on file   Attends Archivist Meetings: Not on file   Marital Status: Not on file   Family History  Problem Relation Age of Onset   Cancer Mother        H&N, smoker   Cancer Sister        lung, 2011, smoker   Other Other        grandmother had mastectomy in her 22's unknown reason   Breast cancer Neg Hx     ASSESSMENT Recent Results: The most recent result is correlated with 12 mg per week: Lab Results  Component Value Date   INR 3.4 (A) 08/21/2019   INR 2.1 07/31/2019   INR 2.2 07/03/2019    Anticoagulation Dosing: Description   Begin holding warfarin on 08/22/2019 (patient had already taken today's dose and likely INR could increase tomorrow) in  anticipation of procedure on 08/28/2019. Resume previous regimen of warfarin 24-48hrs after procedure on 08/28/2019 at discretion of [facility] (Dr. Gloriann Loan).      INR today: Supratherapeutic  PLAN Begin holding warfarin on 08/22/2019 (patient had already taken today's dose and likely INR could increase further tomorrow) in anticipation of procedure on 08/28/2019. Resume previous regimen of warfarin 24-48hrs after procedure on 08/28/2019 at discretion of [facility] (Dr. Gloriann Loan).     Patient Instructions  Patient instructed to take medications as defined in the Anti-coagulation Track section of this encounter.  Patient instructed to not to take  tomorrow's dose (patient had already taken today's dose) Patient instructed to begin holding warfarin on 08/22/2019 (patient had already taken today's dose and likely INR could increase tomorrow) in anticipation of procedure on 08/28/2019. Resume previous regimen of warfarin 24-48hrs after procedure on 08/28/2019 at discretion of [facility] (Dr. Gloriann Loan).  Patient verbalized understanding of these instructions.     Patient advised to contact clinic or seek medical attention if signs/symptoms of bleeding or thromboembolism occur.  Patient verbalized understanding by repeating back information and was advised to contact me if further medication-related questions arise. Patient was also provided an information handout.  Follow-up Return in 2 weeks (on 09/04/2019) for Follow up INR at 1015.  Cristela Felt, PharmD PGY1 Pharmacy Resident [employer]: [phone]  15 minutes spent face-to-face with the patient during the encounter. 50% of time spent on education, including signs/sx bleeding and clotting, as well as food and drug interactions with warfarin. 50% of time was spent on fingerprick POC INR sample collection,processing, results determination, and documentation in [email or web address]/.

## 2019-08-22 NOTE — Patient Instructions (Addendum)
DUE TO COVID-19 ONLY ONE VISITOR IS ALLOWED TO COME WITH YOU AND STAY IN THE WAITING ROOM ONLY DURING PRE OP AND PROCEDURE DAY OF SURGERY. THE 1 VISITOR MAY VISIT WITH YOU AFTER SURGERY IN YOUR PRIVATE ROOM DURING VISITING HOURS ONLY!  YOU NEED TO HAVE A COVID 19 TEST ON: 08/25/19 @ 1:30 am        , THIS TEST MUST BE DONE BEFORE SURGERY, COME  [place], [place] , [address].  ([place]) ONCE YOUR COVID TEST IS COMPLETED, PLEASE BEGIN THE QUARANTINE INSTRUCTIONS AS OUTLINED IN YOUR HANDOUT.                [place]     Your procedure is scheduled on: 08/28/19   Report to [facility] Main  Entrance   Report to admitting at: 9:30 AM     Call this number if you have problems the morning of surgery [phone]    Remember: Do not eat food or drink liquids :After Midnight.   BRUSH YOUR TEETH MORNING OF SURGERY AND RINSE YOUR MOUTH OUT, NO CHEWING GUM CANDY OR MINTS.     Take these medicines the morning of surgery with A SIP OF WATER: LEVOTHYROXINE,PANTOPRAZOLE,COLESTIPOL                                 You may not have any metal on your body including hair pins and              piercings  Do not wear jewelry, make-up, lotions, powders or perfumes, deodorant             Do not wear nail polish on your fingernails.  Do not shave  48 hours prior to surgery.                 Do not bring valuables to the hospital. [place].  Contacts, dentures or bridgework may not be worn into surgery.  Leave suitcase in the car. After surgery it may be brought to your room.     Patients discharged the day of surgery will not be allowed to drive home. IF YOU ARE HAVING SURGERY AND GOING HOME THE SAME DAY, YOU MUST HAVE AN ADULT TO DRIVE YOU HOME AND BE WITH YOU FOR 24 HOURS. YOU MAY GO HOME BY TAXI OR UBER OR ORTHERWISE, BUT AN ADULT MUST ACCOMPANY YOU HOME AND STAY WITH YOU FOR 24 HOURS.  Name and phone  number of your driver:  Special Instructions: N/A              Please read over the following fact sheets you were given: _____________________________________________________________________             [facility] - Preparing for Surgery Before surgery, you can play an important role.  Because skin is not sterile, your skin needs to be as free of germs as possible.  You can reduce the number of germs on your skin by washing with CHG (chlorahexidine gluconate) soap before surgery.  CHG is an antiseptic cleaner which kills germs and bonds with the skin to continue killing germs even after washing. Please DO NOT use if you have an allergy to CHG or antibacterial soaps.  If your skin becomes reddened/irritated stop using the CHG and inform your  nurse when you arrive at Short Stay. Do not shave (including legs and underarms) for at least 48 hours prior to the first CHG shower.  You may shave your face/neck. Please follow these instructions carefully:  1.  Shower with CHG Soap the night before surgery and the  morning of Surgery.  2.  If you choose to wash your hair, wash your hair first as usual with your  normal  shampoo.  3.  After you shampoo, rinse your hair and body thoroughly to remove the  shampoo.                           4.  Use CHG as you would any other liquid soap.  You can apply chg directly  to the skin and wash                       Gently with a scrungie or clean washcloth.  5.  Apply the CHG Soap to your body ONLY FROM THE NECK DOWN.   Do not use on face/ open                           Wound or open sores. Avoid contact with eyes, ears mouth and genitals (private parts).                       Wash face,  Genitals (private parts) with your normal soap.             6.  Wash thoroughly, paying special attention to the area where your surgery  will be performed.  7.  Thoroughly rinse your body with warm water from the neck down.  8.  DO NOT shower/wash with your normal soap after  using and rinsing off  the CHG Soap.                9.  Pat yourself dry with a clean towel.            10.  Wear clean pajamas.            11.  Place clean sheets on your bed the night of your first shower and do not  sleep with pets. Day of Surgery : Do not apply any lotions/deodorants the morning of surgery.  Please wear clean clothes to the hospital/surgery center.  FAILURE TO FOLLOW THESE INSTRUCTIONS MAY RESULT IN THE CANCELLATION OF YOUR SURGERY PATIENT SIGNATURE_________________________________  NURSE SIGNATURE__________________________________  ________________________________________________________________________

## 2019-08-23 ENCOUNTER — Encounter (HOSPITAL_COMMUNITY): Payer: Self-pay

## 2019-08-23 ENCOUNTER — Other Ambulatory Visit: Payer: Self-pay

## 2019-08-23 ENCOUNTER — Encounter (HOSPITAL_COMMUNITY)
Admission: RE | Admit: 2019-08-23 | Discharge: 2019-08-23 | Disposition: A | Discharge status: Home / Self Care | Payer: Medicare Other | Source: Ambulatory Visit | Attending: Urology | Admitting: Urology

## 2019-08-23 DIAGNOSIS — E039 Hypothyroidism, unspecified: Secondary | ICD-10-CM | POA: Diagnosis not present

## 2019-08-23 DIAGNOSIS — C679 Malignant neoplasm of bladder, unspecified: Secondary | ICD-10-CM | POA: Insufficient documentation

## 2019-08-23 DIAGNOSIS — E785 Hyperlipidemia, unspecified: Secondary | ICD-10-CM | POA: Insufficient documentation

## 2019-08-23 DIAGNOSIS — Z01812 Encounter for preprocedural laboratory examination: Secondary | ICD-10-CM | POA: Insufficient documentation

## 2019-08-23 DIAGNOSIS — Z7901 Long term (current) use of anticoagulants: Secondary | ICD-10-CM | POA: Insufficient documentation

## 2019-08-23 DIAGNOSIS — Z87891 Personal history of nicotine dependence: Secondary | ICD-10-CM | POA: Insufficient documentation

## 2019-08-23 DIAGNOSIS — Z79890 Hormone replacement therapy (postmenopausal): Secondary | ICD-10-CM | POA: Insufficient documentation

## 2019-08-23 DIAGNOSIS — Z79899 Other long term (current) drug therapy: Secondary | ICD-10-CM | POA: Insufficient documentation

## 2019-08-23 HISTORY — DX: Other complications of anesthesia, initial encounter: T88.59XA

## 2019-08-23 HISTORY — DX: Essential (primary) hypertension: I10

## 2019-08-23 HISTORY — DX: Other specified postprocedural states: Z98.890

## 2019-08-23 HISTORY — DX: Nausea with vomiting, unspecified: R11.2

## 2019-08-23 HISTORY — DX: Nausea with vomiting, unspecified: Z98.890

## 2019-08-23 LAB — BASIC METABOLIC PANEL
Anion gap: 10 (ref 5–15)
BUN: 13 mg/dL (ref 8–23)
CO2: 25 mmol/L (ref 22–32)
Calcium: 9.3 mg/dL (ref 8.9–10.3)
Chloride: 104 mmol/L (ref 98–111)
Creatinine, Ser: 0.77 mg/dL (ref 0.44–1.00)
GFR calc Af Amer: >60 mL/min (ref >60)
GFR calc non Af Amer: >60 mL/min (ref >60)
Glucose, Bld: 94 mg/dL (ref 70–99)
Potassium: 4.6 mmol/L (ref 3.5–5.1)
Sodium: 139 mmol/L (ref 135–145)

## 2019-08-23 LAB — CBC
HCT: 37.9 % (ref 36.0–46.0)
Hemoglobin: 12.1 g/dL (ref 12.0–15.0)
MCH: 33.2 pg (ref 26.0–34.0)
MCHC: 31.9 g/dL (ref 30.0–36.0)
MCV: 103.8 fL — ABNORMAL HIGH (ref 80.0–100.0)
Platelets: 341 10*3/uL (ref 150–400)
RBC: 3.65 MIL/uL — ABNORMAL LOW (ref 3.87–5.11)
RDW: 13 % (ref 11.5–15.5)
WBC: 10.9 10*3/uL — ABNORMAL HIGH (ref 4.0–10.5)
nRBC: 0 % (ref 0.0–0.2)

## 2019-08-23 NOTE — Progress Notes (Signed)
INTERNAL MEDICINE TEACHING ATTENDING ADDENDUM   I agree with pharmacy recommendations as outlined in their note.   Anali Cabanilla, MD  

## 2019-08-23 NOTE — Progress Notes (Signed)
PCP - Dr. Lynnae January E.LOV: 12/28 Cardiologist -   Chest x-ray -  EKG - 11/22/18. EPIC Stress Test -  ECHO -  Cardiac Cath -   Sleep Study -  CPAP -   Fasting Blood Sugar -  Checks Blood Sugar _____ times a day  Blood Thinner Instructions:Coumadin.Last dose 08/22/19.Hold from 2/17-2/22,as per Dr Zenovia Jarred recomendations. Aspirin Instructions: Last Dose:  Anesthesia review:   Patient denies shortness of breath, fever, cough and chest pain at PAT appointment   Patient verbalized understanding of instructions that were given to them at the PAT appointment. Patient was also instructed that they will need to review over the PAT instructions again at home before surgery.

## 2019-08-25 ENCOUNTER — Other Ambulatory Visit (HOSPITAL_COMMUNITY)
Admission: RE | Admit: 2019-08-25 | Discharge: 2019-08-25 | Disposition: A | Discharge status: Home / Self Care | Payer: Medicare Other | Source: Ambulatory Visit | Attending: Urology | Admitting: Urology

## 2019-08-25 DIAGNOSIS — Z20822 Contact with and (suspected) exposure to covid-19: Secondary | ICD-10-CM | POA: Diagnosis not present

## 2019-08-25 DIAGNOSIS — Z01812 Encounter for preprocedural laboratory examination: Secondary | ICD-10-CM | POA: Insufficient documentation

## 2019-08-25 LAB — SARS CORONAVIRUS 2 (TAT 6-24 HRS): SARS Coronavirus 2: NEGATIVE

## 2019-08-27 NOTE — Anesthesia Preprocedure Evaluation (Addendum)
Anesthesia Evaluation  Patient identified by MRN, date of birth, ID band Patient awake    Reviewed: Allergy & Precautions, NPO status , Patient's Chart, lab work & pertinent test results  History of Anesthesia Complications (+) PONV and history of anesthetic complications  Airway Mallampati: II  TM Distance: >3 FB Neck ROM: Full    Dental no notable dental hx.    Pulmonary former smoker,  20 pack year history, quit 1987   breath sounds clear to auscultation       Cardiovascular hypertension, Pt. on medications negative cardio ROS   Rhythm:Regular Rate:Normal     Neuro/Psych negative neurological ROS  negative psych ROS   GI/Hepatic Neg liver ROS, GERD  Medicated and Controlled,  Endo/Other  Hypothyroidism   Renal/GU negative Renal ROS Bladder dysfunction  Bladder tumor    Musculoskeletal  (+) Arthritis , Osteoarthritis,    Abdominal   Peds negative pediatric ROS (+)  Hematology  (+) Blood dyscrasia, , Factor II deficiency- on coumadin   Anesthesia Other Findings   Reproductive/Obstetrics negative OB ROS                            Anesthesia Physical Anesthesia Plan  ASA: III  Anesthesia Plan: General   Post-op Pain Management:    Induction: Intravenous  PONV Risk Score and Plan: 4 or greater and Ondansetron, Dexamethasone, Midazolam and Scopolamine patch - Pre-op  Airway Management Planned: LMA  Additional Equipment: None  Intra-op Plan:   Post-operative Plan: Extubation in OR  Informed Consent: I have reviewed the patients History and Physical, chart, labs and discussed the procedure including the risks, benefits and alternatives for the proposed anesthesia with the patient or authorized representative who has indicated his/her understanding and acceptance.     Dental advisory given  Plan Discussed with: CRNA and Surgeon  Anesthesia Plan Comments:         Anesthesia Quick Evaluation

## 2019-08-28 ENCOUNTER — Encounter (HOSPITAL_COMMUNITY): Payer: Self-pay | Admitting: Urology

## 2019-08-28 ENCOUNTER — Encounter (HOSPITAL_COMMUNITY)
Admission: RE | Disposition: A | Discharge status: Home / Self Care | Payer: Self-pay | Source: Home / Self Care | Attending: Urology

## 2019-08-28 ENCOUNTER — Other Ambulatory Visit: Payer: Self-pay

## 2019-08-28 ENCOUNTER — Ambulatory Visit (HOSPITAL_COMMUNITY): Payer: Medicare Other | Admitting: Anesthesiology

## 2019-08-28 ENCOUNTER — Ambulatory Visit (HOSPITAL_COMMUNITY): Payer: Medicare Other | Admitting: Physician Assistant

## 2019-08-28 ENCOUNTER — Ambulatory Visit (HOSPITAL_COMMUNITY)
Admission: RE | Admit: 2019-08-28 | Discharge: 2019-08-28 | Disposition: A | Discharge status: Home / Self Care | Payer: Medicare Other | Attending: Urology | Admitting: Urology

## 2019-08-28 DIAGNOSIS — R310 Gross hematuria: Secondary | ICD-10-CM | POA: Insufficient documentation

## 2019-08-28 DIAGNOSIS — M1990 Unspecified osteoarthritis, unspecified site: Secondary | ICD-10-CM | POA: Diagnosis not present

## 2019-08-28 DIAGNOSIS — D682 Hereditary deficiency of other clotting factors: Secondary | ICD-10-CM | POA: Diagnosis not present

## 2019-08-28 DIAGNOSIS — I10 Essential (primary) hypertension: Secondary | ICD-10-CM | POA: Diagnosis not present

## 2019-08-28 DIAGNOSIS — Z87891 Personal history of nicotine dependence: Secondary | ICD-10-CM | POA: Diagnosis not present

## 2019-08-28 DIAGNOSIS — K219 Gastro-esophageal reflux disease without esophagitis: Secondary | ICD-10-CM | POA: Insufficient documentation

## 2019-08-28 DIAGNOSIS — D494 Neoplasm of unspecified behavior of bladder: Secondary | ICD-10-CM | POA: Diagnosis present

## 2019-08-28 DIAGNOSIS — E039 Hypothyroidism, unspecified: Secondary | ICD-10-CM | POA: Insufficient documentation

## 2019-08-28 DIAGNOSIS — C674 Malignant neoplasm of posterior wall of bladder: Secondary | ICD-10-CM | POA: Insufficient documentation

## 2019-08-28 HISTORY — PX: TRANSURETHRAL RESECTION OF BLADDER TUMOR WITH MITOMYCIN-C: SHX6459

## 2019-08-28 SURGERY — TRANSURETHRAL RESECTION OF BLADDER TUMOR WITH MITOMYCIN-C
Anesthesia: General

## 2019-08-28 MED ORDER — LACTATED RINGERS IV SOLN: INTRAVENOUS | Status: DC

## 2019-08-28 MED ORDER — EPHEDRINE 5 MG/ML INJ
INTRAVENOUS | Status: AC
  Filled 2019-08-28: qty 20

## 2019-08-28 MED ORDER — FENTANYL CITRATE (PF) 100 MCG/2ML IJ SOLN
INTRAMUSCULAR | Status: AC
  Filled 2019-08-28: qty 2

## 2019-08-28 MED ORDER — FENTANYL CITRATE (PF) 100 MCG/2ML IJ SOLN
INTRAMUSCULAR | Status: DC | PRN
  Administered 2019-08-28 (×2): 50 ug via INTRAVENOUS

## 2019-08-28 MED ORDER — LIDOCAINE 2% (20 MG/ML) 5 ML SYRINGE
INTRAMUSCULAR | Status: DC | PRN
  Administered 2019-08-28: 100 mg via INTRAVENOUS

## 2019-08-28 MED ORDER — HYDROCODONE-ACETAMINOPHEN 5-325 MG PO TABS: 1.0000 | ORAL_TABLET | ORAL | 0 refills | Status: DC | PRN

## 2019-08-28 MED ORDER — OXYCODONE HCL 5 MG PO TABS: 5.0000 mg | ORAL_TABLET | Freq: Once | ORAL | Status: DC

## 2019-08-28 MED ORDER — ONDANSETRON HCL 4 MG/2ML IJ SOLN: 4.0000 mg | Freq: Once | INTRAMUSCULAR | Status: DC | PRN

## 2019-08-28 MED ORDER — PROPOFOL 10 MG/ML IV BOLUS
INTRAVENOUS | Status: AC
  Filled 2019-08-28: qty 40

## 2019-08-28 MED ORDER — PROPOFOL 10 MG/ML IV BOLUS
INTRAVENOUS | Status: DC | PRN
  Administered 2019-08-28: 160 mg via INTRAVENOUS

## 2019-08-28 MED ORDER — ONDANSETRON HCL 4 MG/2ML IJ SOLN
INTRAMUSCULAR | Status: DC | PRN
  Administered 2019-08-28: 4 mg via INTRAVENOUS

## 2019-08-28 MED ORDER — FENTANYL CITRATE (PF) 100 MCG/2ML IJ SOLN: 25.0000 ug | INTRAMUSCULAR | Status: DC | PRN

## 2019-08-28 MED ORDER — ACETAMINOPHEN 500 MG PO TABS
1000.0000 mg | ORAL_TABLET | Freq: Once | ORAL | Status: AC
  Administered 2019-08-28: 1000 mg via ORAL
  Filled 2019-08-28: qty 2

## 2019-08-28 MED ORDER — EPHEDRINE SULFATE-NACL 50-0.9 MG/10ML-% IV SOSY
PREFILLED_SYRINGE | INTRAVENOUS | Status: DC | PRN
  Administered 2019-08-28 (×2): 5 mg via INTRAVENOUS

## 2019-08-28 MED ORDER — CEFAZOLIN SODIUM-DEXTROSE 2-4 GM/100ML-% IV SOLN
2.0000 g | INTRAVENOUS | Status: AC
  Administered 2019-08-28: 12:00:00 2 g via INTRAVENOUS
  Filled 2019-08-28: qty 100

## 2019-08-28 MED ORDER — SODIUM CHLORIDE 0.9 % IR SOLN
Status: DC | PRN
  Administered 2019-08-28: 9000 mL via INTRAVESICAL

## 2019-08-28 MED ORDER — DEXAMETHASONE SODIUM PHOSPHATE 10 MG/ML IJ SOLN
INTRAMUSCULAR | Status: DC | PRN
  Administered 2019-08-28: 8 mg via INTRAVENOUS

## 2019-08-28 MED ORDER — SCOPOLAMINE 1 MG/3DAYS TD PT72
1.0000 | MEDICATED_PATCH | TRANSDERMAL | Status: DC
  Administered 2019-08-28: 10:00:00 1.5 mg via TRANSDERMAL
  Filled 2019-08-28: qty 1

## 2019-08-28 MED ORDER — LIDOCAINE 2% (20 MG/ML) 5 ML SYRINGE
INTRAMUSCULAR | Status: AC
  Filled 2019-08-28: qty 15

## 2019-08-28 SURGICAL SUPPLY — 18 items
BAG DRN RND TRDRP ANRFLXCHMBR (UROLOGICAL SUPPLIES)
BAG URINE DRAIN 2000ML AR STRL (UROLOGICAL SUPPLIES) IMPLANT
BAG URO CATCHER STRL LF (MISCELLANEOUS) ×2 IMPLANT
CATH FOLEY 2WAY SLVR  5CC 18FR (CATHETERS)
CATH FOLEY 2WAY SLVR 5CC 18FR (CATHETERS) IMPLANT
ELECT REM PT RETURN 15FT ADLT (MISCELLANEOUS) ×2 IMPLANT
GLOVE BIO SURGEON STRL SZ7.5 (GLOVE) ×2 IMPLANT
GOWN STRL REUS W/TWL XL LVL3 (GOWN DISPOSABLE) ×2 IMPLANT
KIT TURNOVER KIT A (KITS) ×1 IMPLANT
LOOP CUT BIPOLAR 24F LRG (ELECTROSURGICAL) ×1 IMPLANT
MANIFOLD NEPTUNE II (INSTRUMENTS) ×2 IMPLANT
PACK CYSTO (CUSTOM PROCEDURE TRAY) ×2 IMPLANT
PENCIL SMOKE EVACUATOR (MISCELLANEOUS) IMPLANT
PLUG CATH AND CAP STER (CATHETERS) IMPLANT
SYR TOOMEY IRRIG 70ML (MISCELLANEOUS) ×2
SYRINGE TOOMEY IRRIG 70ML (MISCELLANEOUS) IMPLANT
TUBING CONNECTING 10 (TUBING) ×2 IMPLANT
TUBING UROLOGY SET (TUBING) ×2 IMPLANT

## 2019-08-28 NOTE — Discharge Instructions (Addendum)
Transurethral Resection of Bladder Tumor (TURBT) or Bladder Biopsy ° ° °Definition: ° Transurethral Resection of the Bladder Tumor is a surgical procedure used to diagnose and remove tumors within the bladder. TURBT is the most common treatment for early stage bladder cancer. ° °General instructions: °   ° Your recent bladder surgery requires very little post hospital care but some definite precautions. ° °Despite the fact that no skin incisions were used, the area around the bladder incisions are raw and covered with scabs to promote healing and prevent bleeding. Certain precautions are needed to insure that the scabs are not disturbed over the next 2-4 weeks while the healing proceeds. ° °Because the raw surface inside your bladder and the irritating effects of urine you may expect frequency of urination and/or urgency (a stronger desire to urinate) and perhaps even getting up at night more often. This will usually resolve or improve slowly over the healing period. You may see some blood in your urine over the first 6 weeks. Do not be alarmed, even if the urine was clear for a while. Get off your feet and drink lots of fluids until clearing occurs. If you start to pass clots or don't improve call us. ° °Diet: ° °You may return to your normal diet immediately. Because of the raw surface of your bladder, alcohol, spicy foods, foods high in acid and drinks with caffeine may cause irritation or frequency and should be used in moderation. To keep your urine flowing freely and avoid constipation, drink plenty of fluids during the day (8-10 glasses). Tip: Avoid cranberry juice because it is very acidic. ° °Activity: ° °Your physical activity doesn't need to be restricted. However, if you are very active, you may see some blood in the urine. We suggest that you reduce your activity under the circumstances until the bleeding has stopped. ° °Bowels: ° °It is important to keep your bowels regular during the postoperative  period. Straining with bowel movements can cause bleeding. A bowel movement every other day is reasonable. Use a mild laxative if needed, such as milk of magnesia 2-3 tablespoons, or 2 Dulcolax tablets. Call if you continue to have problems. If you had been taking narcotics for pain, before, during or after your surgery, you may be constipated. Take a laxative if necessary. ° ° ° °Medication: ° °You should resume your pre-surgery medications unless told not to. In addition you may be given an antibiotic to prevent or treat infection. Antibiotics are not always necessary. All medication should be taken as prescribed until the bottles are finished unless you are having an unusual reaction to one of the drugs. ° ° ° °General Anesthesia, Adult, Care After °This sheet gives you information about how to care for yourself after your procedure. Your health care provider may also give you more specific instructions. If you have problems or questions, contact your health care provider. °What can I expect after the procedure? °After the procedure, the following side effects are common: °· Pain or discomfort at the IV site. °· Nausea. °· Vomiting. °· Sore throat. °· Trouble concentrating. °· Feeling cold or chills. °· Weak or tired. °· Sleepiness and fatigue. °· Soreness and body aches. These side effects can affect parts of the body that were not involved in surgery. °Follow these instructions at home: ° °For at least 24 hours after the procedure: °· Have a responsible adult stay with you. It is important to have someone help care for you until you are awake and   alert. °· Rest as needed. °· Do not: °? Participate in activities in which you could fall or become injured. °? Drive. °? Use heavy machinery. °? Drink alcohol. °? Take sleeping pills or medicines that cause drowsiness. °? Make important decisions or sign legal documents. °? Take care of children on your own. °Eating and drinking °· Follow any instructions from your  health care provider about eating or drinking restrictions. °· When you feel hungry, start by eating small amounts of foods that are soft and easy to digest (bland), such as toast. Gradually return to your regular diet. °· Drink enough fluid to keep your urine pale yellow. °· If you vomit, rehydrate by drinking water, juice, or clear broth. °General instructions °· If you have sleep apnea, surgery and certain medicines can increase your risk for breathing problems. Follow instructions from your health care provider about wearing your sleep device: °? Anytime you are sleeping, including during daytime naps. °? While taking prescription pain medicines, sleeping medicines, or medicines that make you drowsy. °· Return to your normal activities as told by your health care provider. Ask your health care provider what activities are safe for you. °· Take over-the-counter and prescription medicines only as told by your health care provider. °· If you smoke, do not smoke without supervision. °· Keep all follow-up visits as told by your health care provider. This is important. °Contact a health care provider if: °· You have nausea or vomiting that does not get better with medicine. °· You cannot eat or drink without vomiting. °· You have pain that does not get better with medicine. °· You are unable to pass urine. °· You develop a skin rash. °· You have a fever. °· You have redness around your IV site that gets worse. °Get help right away if: °· You have difficulty breathing. °· You have chest pain. °· You have blood in your urine or stool, or you vomit blood. °Summary °· After the procedure, it is common to have a sore throat or nausea. It is also common to feel tired. °· Have a responsible adult stay with you for the first 24 hours after general anesthesia. It is important to have someone help care for you until you are awake and alert. °· When you feel hungry, start by eating small amounts of foods that are soft and easy  to digest (bland), such as toast. Gradually return to your regular diet. °· Drink enough fluid to keep your urine pale yellow. °· Return to your normal activities as told by your health care provider. Ask your health care provider what activities are safe for you. °This information is not intended to replace advice given to you by your health care provider. Make sure you discuss any questions you have with your health care provider. °Document Revised: 06/25/2017 Document Reviewed: 02/05/2017 °Elsevier Patient Education © 2020 [employer] ° ° ° ° ° ° °

## 2019-08-28 NOTE — Transfer of Care (Signed)
Immediate Anesthesia Transfer of Care Note  Patient: Pamela [name]  Procedure(s) Performed: Procedure(s): TRANSURETHRAL RESECTION OF BLADDER TUMOR (N/A)  Patient Location: PACU  Anesthesia Type:General  Level of Consciousness:  sedated, patient cooperative and responds to stimulation  Airway & Oxygen Therapy:Patient Spontanous Breathing and Patient connected to face mask oxgen  Post-op Assessment:  Report given to PACU RN and Post -op Vital signs reviewed and stable  Post vital signs:  Reviewed and stable  Last Vitals:  Vitals:   08/28/19 0924 08/28/19 1208  BP: (!) 152/78 124/60  Pulse: 90 85  Resp: 16 14  Temp: 36.8 C 36.7 C  SpO2: [id number]    Complications: No apparent anesthesia complications

## 2019-08-28 NOTE — Anesthesia Procedure Notes (Signed)
Procedure Name: LMA Insertion Date/Time: 08/28/2019 11:51 AM Performed by: Lavina Hamman, CRNA Pre-anesthesia Checklist: Patient identified, Emergency Drugs available, Suction available and Patient being monitored Patient Re-evaluated:Patient Re-evaluated prior to induction Oxygen Delivery Method: Circle System Utilized Preoxygenation: Pre-oxygenation with 100% oxygen Induction Type: IV induction Ventilation: Mask ventilation without difficulty LMA: LMA inserted LMA Size: 4.0 Number of attempts: 1 Airway Equipment and Method: Bite block Placement Confirmation: positive ETCO2 Tube secured with: Tape Dental Injury: Teeth and Oropharynx as per pre-operative assessment

## 2019-08-28 NOTE — H&P (Signed)
CC/HPI: CC: Bladder tumor  HPI:  08/15/2019  Referral from Dr. Matilde Sprang. She has a history of gross hematuria. She underwent cystoscopy that revealed an approximately 1.5 cm papillary bladder tumor on the right posterior wall. She underwent a CT scan that showed some bladder wall thickening corresponding to the area seen on cystoscopy. No upper tract lesions.     ALLERGIES: No Allergies    MEDICATIONS: Levothyroxine Sodium  Warfarin Sodium 4 mg tablet  Colestipol Hcl  Losartan Potassium  Pantoprazole Sodium 40 mg tablet, delayed release     GU PSH: Cystoscopy - 07/27/2019 Locm 300-399Mg /Ml Iodine,1Ml - 08/08/2019     NON-GU PSH: No Non-GU PSH    GU PMH: Gross hematuria - 07/27/2019 Urinary Frequency - 07/27/2019 Urinary Tract Inf, Unspec site - 07/27/2019    NON-GU PMH: No Non-GU PMH    FAMILY HISTORY: No Family History    SOCIAL HISTORY: No Social History    REVIEW OF SYSTEMS:    GU Review Female:   Patient denies frequent urination, hard to postpone urination, burning /pain with urination, get up at night to urinate, leakage of urine, stream starts and stops, trouble starting your stream, have to strain to urinate, and being pregnant.  Gastrointestinal (Upper):   Patient denies nausea, vomiting, and indigestion/ heartburn.  Gastrointestinal (Lower):   Patient denies diarrhea and constipation.  Constitutional:   Patient denies fever, night sweats, weight loss, and fatigue.  Skin:   Patient denies skin rash/ lesion and itching.  Eyes:   Patient denies blurred vision and double vision.  Ears/ Nose/ Throat:   Patient denies sore throat and sinus problems.  Hematologic/Lymphatic:   Patient denies swollen glands and easy bruising.  Cardiovascular:   Patient denies leg swelling and chest pains.  Respiratory:   Patient denies cough and shortness of breath.  Endocrine:   Patient denies excessive thirst.  Musculoskeletal:   Patient denies back pain and joint pain.  Neurological:    Patient denies headaches and dizziness.  Psychologic:   Patient denies depression and anxiety.   VITAL SIGNS:      08/15/2019 09:12 AM  BP 144/70 mmHg  Heart Rate 70 /min  Temperature 97.1 F / 36.1 C   PAST DATA REVIEWED:  Source Of History:  Patient  Records Review:   Previous Doctor Records, Previous Patient Records  X-Ray Review: C.T. Abdomen/Pelvis: Reviewed Films. Reviewed Report. Discussed With Patient.     PROCEDURES: None   ASSESSMENT:      ICD-10 Details  1 GU:   Gross hematuria - R31.0 Chronic, Stable  2   Bladder tumor/neoplasm - D41.4 Acute, Uncomplicated     PLAN:           Schedule Procedure: Unspecified Date - Cystoscopy TURBT 2-5 cm - 52235          Document Letter(s):  Created for Patient: Clinical Summary         Notes:   Plan for transurethral resection of bladder tumor with possible instillation of gemcitabine. Risks and benefits discussed. She will need to have clearance to come off warfarin.   Cc: Dr. Dareen Piano    Signed by Link Snuffer, III, M.D. on 08/15/19 at 9:28 AM (EST

## 2019-08-28 NOTE — Op Note (Signed)
Operative Note  Preoperative diagnosis:  1.  Bladder tumor  Postoperative diagnosis: 1.  Bladder tumor- medium  Procedure(s): 1.  Transurethral resection of bladder tumor--medium  Surgeon: Link Snuffer, MD  Assistants: None  Anesthesia: General  Complications: None immediate  EBL: Minimal  Specimens: 1.  Bladder tumor  Drains/Catheters: 1.  None  Intraoperative findings: 1.  Normal urethra 2.  Approximately 3 cm area of abnormal mucosa on the posterior bladder wall towards the dome.  Did not have the typical papillary superficial appearance of a low-grade bladder tumor.  Therefore, I opted against intravesical chemotherapy.  Entire lesion appeared to be resected.  Indication: 74 year old female with a bladder tumor presents for the previously mentioned operation.  Description of procedure:  The patient was identified and consent was obtained.  The patient was taken to the operating room and placed in the supine position.  The patient was placed under general anesthesia.  Perioperative antibiotics were administered.  The patient was placed in dorsal lithotomy.  Patient was prepped and draped in a standard sterile fashion and a timeout was performed.  A 26 French resectoscope with a visual obturator in place was advanced into the urethra and into the bladder.  Complete cystoscopy was performed with findings noted above.  I exchanged this for the bipolar working element and on bipolar settings I proceeded to resect the tumor of interest.  Bladder chips were collected for specimen.  There was no significant bleeding after fulguration of the bladder resection bed and surrounding area.  There were no other tumors seen.  I therefore drained the bladder and withdrew the scope.  This concluded the operation.  I opted against intravesical chemotherapy given the atypical appearance of the mass.  This concluded the operation.  Patient tolerated the procedure well and was stable  postoperatively.  Plan: Follow-up in 1 week for pathology review

## 2019-08-30 ENCOUNTER — Encounter: Payer: Self-pay | Admitting: *Deleted

## 2019-08-30 LAB — SURGICAL PATHOLOGY

## 2019-08-30 NOTE — Anesthesia Postprocedure Evaluation (Signed)
Anesthesia Post Note  Patient: Jillana Dibert  Procedure(s) Performed: TRANSURETHRAL RESECTION OF BLADDER TUMOR (N/A )     Patient location during evaluation: PACU Anesthesia Type: General Level of consciousness: awake and alert Pain management: pain level controlled Vital Signs Assessment: post-procedure vital signs reviewed and stable Respiratory status: spontaneous breathing, nonlabored ventilation, respiratory function stable and patient connected to nasal cannula oxygen Cardiovascular status: blood pressure returned to baseline and stable Postop Assessment: no apparent nausea or vomiting Anesthetic complications: no    Last Vitals:  Vitals:   08/28/19 1312 08/28/19 1345  BP: (!) 149/81 (!) 144/81  Pulse: 83 85  Resp: 16 16  Temp: 37.2 C 37.2 C  SpO2: 98% 96%    Last Pain:  Vitals:   08/28/19 1345  TempSrc:   PainSc: 2                  Audery Wassenaar

## 2019-09-04 ENCOUNTER — Ambulatory Visit (INDEPENDENT_AMBULATORY_CARE_PROVIDER_SITE_OTHER): Payer: Medicare Other | Admitting: Pharmacist

## 2019-09-04 DIAGNOSIS — D6869 Other thrombophilia: Secondary | ICD-10-CM | POA: Diagnosis not present

## 2019-09-04 DIAGNOSIS — Z86718 Personal history of other venous thrombosis and embolism: Secondary | ICD-10-CM | POA: Diagnosis not present

## 2019-09-04 DIAGNOSIS — Z7901 Long term (current) use of anticoagulants: Secondary | ICD-10-CM | POA: Diagnosis not present

## 2019-09-04 DIAGNOSIS — Z5181 Encounter for therapeutic drug level monitoring: Secondary | ICD-10-CM

## 2019-09-04 LAB — POCT INR: INR: 1.3 — AB (ref 2.0–3.0)

## 2019-09-04 NOTE — Patient Instructions (Signed)
Patient instructed to take medications as defined in the Anti-coagulation Track section of this encounter.  Patient instructed to take today's dose.  Patient instructed to take one tablet of your 4mg  blue warfarin on Monday 1-MAR-21. All other days, take only 1/2 tablet  of your 4mg  blue warfarin.Patient verbalized understanding of these instructions.

## 2019-09-04 NOTE — Progress Notes (Signed)
Anticoagulation Management Pamela [name] is a 74 y.o. female who reports to the clinic for monitoring of warfarin treatment.    Indication: DVT, History of (resolved); Secondary hypercoagulable state; Chronic long-term current use of anticoagulant.   Duration: indefinite Supervising physician: [place] [facility] Visit History: Patient does not report signs/symptoms of bleeding or thromboembolism  Other recent changes: No diet, medications, lifestyle changes except as noted in patient findings.  Anticoagulation Episode Summary    Current INR goal:  2.0-3.0  TTR:  69.5 % (9 y)  Next INR check:  09/11/2019  INR from last check:  1.3 (09/04/2019)  Weekly max warfarin dose:    Target end date:  Indefinite  INR check location:  Anticoagulation Clinic  Preferred lab:    Send INR reminders to:  [facility]   Indications   Secondary hypercoagulable state ([place]) [D68.69] DVT HX OF (Resolved) [id number]       Comments:        Anticoagulation Care Providers    Provider Role Specialty Phone number   Burman Freestone, MD  Internal Medicine [phone]      Allergies  Allergen Reactions  . Ace Inhibitors Cough    Current Outpatient Medications:  .  acetaminophen (TYLENOL) 325 MG tablet, Take 650 mg by mouth every 6 (six) hours as needed for moderate pain or headache., Disp: , Rfl:  .  Ascorbic Acid (VITAMIN C PO), Take 1 tablet by mouth every evening., Disp: , Rfl:  .  CALCIUM PO, Take 1 tablet by mouth every evening., Disp: , Rfl:  .  colestipol (COLESTID) 1 g tablet, TAKE 10 TABLETS BY MOUTH EVERY DAY (Patient taking differently: Take 6 g by mouth daily at 12 noon. ), Disp: 900 tablet, Rfl: 3 .  HYDROcodone-acetaminophen (NORCO) 5-325 MG tablet, Take 1 tablet by mouth every 4 (four) hours as needed for moderate pain., Disp: 6 tablet, Rfl: 0 .  levothyroxine (SYNTHROID) 88 MCG tablet, TAKE ONE (1) TABLET BY MOUTH EVERY DAY (Patient taking differently:  Take 88 mcg by mouth daily before breakfast. ), Disp: 90 tablet, Rfl: 3 .  losartan (COZAAR) 25 MG tablet, TAKE ONE (1) TABLET BY MOUTH EVERY DAY (Patient taking differently: Take 25 mg by mouth daily. ), Disp: 90 tablet, Rfl: 3 .  Multiple Vitamin (MULTIVITAMIN WITH MINERALS) TABS tablet, Take 1 tablet by mouth daily in the afternoon., Disp: , Rfl:  .  pantoprazole (PROTONIX) 40 MG tablet, TAKE ONE (1) TABLET BY MOUTH EVERY DAY (Patient taking differently: Take 40 mg by mouth daily. ), Disp: 90 tablet, Rfl: 3 .  VITAMIN D PO, Take 1 capsule by mouth every evening., Disp: , Rfl:  .  warfarin (COUMADIN) 4 MG tablet, TAKE ONE TABLET BY MOUTH ON MONDAYS AND FRIDAYS AND TAKE ONE-HALF TABLET ALL OTHER DAYS (Patient taking differently: Take 2 mg by mouth See admin instructions. Take 2 mg daily except on skip taking any on Friday), Disp: 65 tablet, Rfl: 11  Current Facility-Administered Medications:  .  cyanocobalamin ((VITAMIN B-12)) injection 1,000 mcg, 1,000 mcg, Intramuscular, Q30 days, Bartholomew Crews, MD, 1,000 mcg at 08/01/18 [id number] Past Medical History:  Diagnosis Date  . Cancer ([place])   . CHOLELITHIASIS, WITH OBSTRUCTION 04/21/2006   s/p ERCP,sprincterotomy, stent ([facility])  . Complication of anesthesia   . 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 GLAUCOMA 04/24/2008  . HYPERLIPIDEMIA 06/03/2006  . Hypertension   . Hypothyroidism lifelong  . OBESITY, MILD  04/24/2008  . PONV (postoperative nausea and vomiting)   . Tenosynovitis 01/2004   Sypher   Social History   Socioeconomic History  . Marital status: Widowed    Spouse name: Not on file  . Number of children: Not on file  . Years of education: Not on file  . Highest education level: Not on file  Occupational History  . 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 Smoking status: Former Smoker    Packs/day: 1.00    Years:  20.00    Pack years: 20.00    Quit date: 07/06/1985    Years since quitting: 34.1  . Smokeless tobacco: Former Systems developer  . Tobacco comment: Quit 1988  Substance and Sexual Activity  . Alcohol use: Yes    Comment: 2-3 glasses of wine daily.  . Drug use: No  . Sexual activity: Not on file  Other Topics Concern  . Not on file  Social History Narrative   Married, no regular exercise.   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 Then worked in [employer] with [employer] [place].    Social Determinants of Health   Financial Resource Strain:   . Difficulty of Paying Living Expenses: Not on file  Food Insecurity:   . Worried About Charity fundraiser in the Last Year: Not on file  . Ran Out of Food in the Last Year: Not on file  Transportation Needs:   . Lack of Transportation (Medical): Not on file  . Lack of Transportation (Non-Medical): Not on file  Physical Activity:   . Days of Exercise per Week: Not on file  . Minutes of Exercise per Session: Not on file  Stress:   . Feeling of Stress : Not on file  Social Connections:   . Frequency of Communication with Friends and Family: Not on file  . Frequency of Social Gatherings with Friends and Family: Not on file  . Attends Religious Services: Not on file  . Active Member of Clubs or Organizations: Not on file  . Attends Archivist Meetings: Not on file  . Marital Status: Not on file   Family History  Problem Relation Age of Onset  . Cancer Mother        H&N, smoker  . Cancer Sister        lung, 2011, smoker  . Other Other        grandmother had mastectomy in her 45's unknown reason  . Breast cancer Neg Hx     ASSESSMENT Recent Results: The most recent result is correlated with 4mg  warfarin for past 4 days.  Lab Results  Component Value Date   INR 1.3 (A) 09/04/2019   INR 3.4 (A) 08/21/2019   INR 2.1 07/31/2019    Anticoagulation Dosing: Description   Take one tablet of your 4mg   blue warfarin on Monday 1-MAR-21. All other days, take only 1/2 tablet  of your 4mg  blue warfarin     INR today: Subtherapeutic  PLAN Weekly dose was increased to 1 tablet today, then 1/2 tablet each remaining day until seen next Monday, March 8th, 2021.  Patient Instructions  Patient instructed to take medications as defined in the Anti-coagulation Track section of this encounter.  Patient instructed to take today's dose.  Patient instructed to take one tablet of your 4mg  blue warfarin on Monday 1-MAR-21.  All other days, take only 1/2 tablet  of your 4mg  blue warfarin.Patient verbalized understanding of these instructions.    Patient advised to contact clinic or seek medical attention if signs/symptoms of bleeding or thromboembolism occur.  Patient verbalized understanding by repeating back information and was advised to contact me if further medication-related questions arise. Patient was also provided an information handout.  Follow-up Return in 1 week (on 09/11/2019) for Follow up INR.  Pennie Banter, PharmD, CPP  15 minutes spent face-to-face with the patient during the encounter. 50% of time spent on education, including signs/sx bleeding and clotting, as well as food and drug interactions with warfarin. 50% of time was spent on fingerprick POC INR sample collection,processing, results determination, and documentation in http://www.kim.net/.

## 2019-09-05 ENCOUNTER — Other Ambulatory Visit: Payer: Self-pay | Admitting: Internal Medicine

## 2019-09-06 ENCOUNTER — Telehealth: Payer: Self-pay | Admitting: Oncology

## 2019-09-06 NOTE — Telephone Encounter (Signed)
Received a new pt referral from Dr. Gloriann Loan at [facility] for bladder cancer. Pt has been cld and scheduled to see Dr. Alen Blew on 3/9 at 11am. She was unable to come to an appt I offered on 3/5. Pt aware to arrive 15 minutes early.

## 2019-09-11 ENCOUNTER — Ambulatory Visit (INDEPENDENT_AMBULATORY_CARE_PROVIDER_SITE_OTHER): Payer: Medicare Other

## 2019-09-11 DIAGNOSIS — Z7901 Long term (current) use of anticoagulants: Secondary | ICD-10-CM

## 2019-09-11 DIAGNOSIS — Z86718 Personal history of other venous thrombosis and embolism: Secondary | ICD-10-CM

## 2019-09-11 DIAGNOSIS — Z5181 Encounter for therapeutic drug level monitoring: Secondary | ICD-10-CM

## 2019-09-11 DIAGNOSIS — D6869 Other thrombophilia: Secondary | ICD-10-CM | POA: Diagnosis not present

## 2019-09-11 LAB — POCT INR: INR: 2.4 (ref 2.0–3.0)

## 2019-09-11 NOTE — Progress Notes (Signed)
INTERNAL MEDICINE TEACHING ATTENDING ADDENDUM - Bryston Colocho M.D  Duration- indefinite, Indication- recurrent DVT, INR- therapeutic. Agree with pharmacy recommendations as outlined in their note.     

## 2019-09-11 NOTE — Patient Instructions (Signed)
Patient instructed to take medications as defined in the Anti-coagulation Track section of this encounter.  Patient instructed to take today's dose.  Patient instructed to take 4mg  of warfarin on Tuesdays and 2mg  all other days  Patient verbalized understanding of these instructions.

## 2019-09-11 NOTE — Progress Notes (Signed)
Anticoagulation Management Pamela [name] is a 74 y.o. female who reports to the clinic for monitoring of warfarin treatment.    Indication: secondary hypercoagulable state, history of DVT  Duration: indefinite Supervising physician: Aldine Contes  Anticoagulation Clinic Visit History: Patient does not report signs/symptoms of bleeding or thromboembolism. Other recent changes: No diet, medications, lifestyle reported by patient.  Anticoagulation Episode Summary    Current INR goal:  2.0-3.0  TTR:  69.4 % (9 y)  Next INR check:  09/11/2019  INR from last check:  2.4 (09/11/2019)  Weekly max warfarin dose:    Target end date:  Indefinite  INR check location:  Anticoagulation Clinic  Preferred lab:    Send INR reminders to:  [facility]   Indications   Secondary hypercoagulable state ([place]) [D68.69] DVT HX OF (Resolved) [id number]       Comments:        Anticoagulation Care Providers    Provider Role Specialty Phone number   Burman Freestone, MD  Internal Medicine [phone]      Allergies  Allergen Reactions  . Ace Inhibitors Cough    Current Outpatient Medications:  .  Ascorbic Acid (VITAMIN C PO), Take 1 tablet by mouth every evening., Disp: , Rfl:  .  CALCIUM PO, Take 1 tablet by mouth every evening., Disp: , Rfl:  .  colestipol (COLESTID) 1 g tablet, TAKE 10 TABLETS BY MOUTH EVERY DAY (Patient taking differently: Take 6 g by mouth daily at 12 noon. ), Disp: 900 tablet, Rfl: 3 .  cyanocobalamin 1000 MCG tablet, Take 1,000 mcg by mouth daily., Disp: , Rfl:  .  levothyroxine (SYNTHROID) 88 MCG tablet, TAKE ONE (1) TABLET BY MOUTH EVERY DAY (Patient taking differently: Take 88 mcg by mouth daily before breakfast. ), Disp: 90 tablet, Rfl: 3 .  losartan (COZAAR) 25 MG tablet, TAKE ONE (1) TABLET BY MOUTH EVERY DAY (Patient taking differently: Take 25 mg by mouth daily. ), Disp: 90 tablet, Rfl: 3 .  Multiple Vitamin (MULTIVITAMIN WITH MINERALS) TABS tablet, Take 1  tablet by mouth daily in the afternoon., Disp: , Rfl:  .  pantoprazole (PROTONIX) 40 MG tablet, TAKE 1 TABLET BY MOUTH ONCE DAILY, Disp: 90 tablet, Rfl: 3 .  VITAMIN D PO, Take 1 capsule by mouth every evening., Disp: , Rfl:  .  warfarin (COUMADIN) 4 MG tablet, TAKE ONE TABLET BY MOUTH ON MONDAYS AND FRIDAYS AND TAKE ONE-HALF TABLET ALL OTHER DAYS (Patient taking differently: Take 2 mg by mouth See admin instructions. Take 2 mg daily and 4mg  on Monday), Disp: 65 tablet, Rfl: 11 .  HYDROcodone-acetaminophen (NORCO) 5-325 MG tablet, Take 1 tablet by mouth every 4 (four) hours as needed for moderate pain. (Patient not taking: Reported on 09/11/2019), Disp: 6 tablet, Rfl: 0 Past Medical History:  Diagnosis Date  . Cancer ([place])   . CHOLELITHIASIS, WITH OBSTRUCTION 04/21/2006   s/p ERCP,sprincterotomy, stent ([facility])  . Complication of anesthesia   . 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 GLAUCOMA 04/24/2008  . HYPERLIPIDEMIA 06/03/2006  . Hypertension   . Hypothyroidism lifelong  . OBESITY, MILD 04/24/2008  . PONV (postoperative nausea and vomiting)   . Tenosynovitis 01/2004   [place]her   Social History   Socioeconomic History  . Marital status: Widowed    Spouse name: Not on file  . Number of children: Not on file  . Years of education: Not on file  . Highest education level: Not  on file  Occupational History  . 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 Smoking status: Former Smoker    Packs/day: 1.00    Years: 20.00    Pack years: 20.00    Quit date: 07/06/1985    Years since quitting: 34.2  . Smokeless tobacco: Former Systems developer  . Tobacco comment: Quit 1988  Substance and Sexual Activity  . Alcohol use: Yes    Comment: 2-3 glasses of wine daily.  . Drug use: No  . Sexual activity: Not on file  Other Topics Concern  . Not on file  Social History Narrative   Married, no  regular exercise.   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    Social Determinants of Health   Financial Resource Strain:   . Difficulty of Paying Living Expenses: Not on file  Food Insecurity:   . Worried About Charity fundraiser in the Last Year: Not on file  . Ran Out of Food in the Last Year: Not on file  Transportation Needs:   . Lack of Transportation (Medical): Not on file  . Lack of Transportation (Non-Medical): Not on file  Physical Activity:   . Days of Exercise per Week: Not on file  . Minutes of Exercise per Session: Not on file  Stress:   . Feeling of Stress : Not on file  Social Connections:   . Frequency of Communication with Friends and Family: Not on file  . Frequency of Social Gatherings with Friends and Family: Not on file  . Attends Religious Services: Not on file  . Active Member of Clubs or Organizations: Not on file  . Attends Archivist Meetings: Not on file  . Marital Status: Not on file   Family History  Problem Relation Age of Onset  . Cancer Mother        H&N, smoker  . Cancer Sister        lung, 2011, smoker  . Other Other        grandmother had mastectomy in her 14's unknown reason  . Breast cancer Neg Hx     ASSESSMENT Recent Results: The most recent result is correlated with 16 mg per week: Lab Results  Component Value Date   INR 2.4 09/11/2019   INR 1.3 (A) 09/04/2019   INR 3.4 (A) 08/21/2019    Anticoagulation Dosing: Description   Take 4mg  of warfarin on Tuesdays and 2mg  on all other days.      INR today: Therapeutic  PLAN Weekly dose was unchanged and continued on 16mg /week. Adjusted from taking 4mg  on Mondays to 4mg  on Tuesdays per patient request but total weekly dose of warfarin remains the same.   Patient Instructions  Patient instructed to take medications as defined in the Anti-coagulation Track section of this encounter.   Patient instructed to take today's dose.  Patient instructed to take 4mg  of warfarin on Tuesdays and 2mg  all other days  Patient verbalized understanding of these instructions.     Patient advised to contact clinic or seek medical attention if signs/symptoms of bleeding or thromboembolism occur.  Patient verbalized understanding by repeating back information and was advised to contact me if further medication-related questions arise. Patient was also provided an information handout.  Follow-up Return in 3 weeks (on 10/02/2019) for Follow  u INR at 1015.  Cristela Felt, PharmD PGY1 Pharmacy Resident  15 minutes spent face-to-face with the patient during the encounter. 50% of time spent on education, including signs/sx bleeding and clotting, as well as food and drug interactions with warfarin. 50% of time was spent on fingerprick POC INR sample collection,processing, results determination, and documentation in [email or web address].kim.net/.

## 2019-09-12 ENCOUNTER — Other Ambulatory Visit: Payer: Self-pay

## 2019-09-12 ENCOUNTER — Telehealth: Payer: Self-pay | Admitting: Oncology

## 2019-09-12 ENCOUNTER — Inpatient Hospital Stay: Payer: Medicare Other | Attending: Oncology | Admitting: Oncology

## 2019-09-12 VITALS — BP 150/69 | HR 87 | Temp 99.1°F | Resp 20 | Ht 64.0 in | Wt 185.1 lb

## 2019-09-12 DIAGNOSIS — C679 Malignant neoplasm of bladder, unspecified: Secondary | ICD-10-CM

## 2019-09-12 DIAGNOSIS — E785 Hyperlipidemia, unspecified: Secondary | ICD-10-CM | POA: Diagnosis not present

## 2019-09-12 DIAGNOSIS — I10 Essential (primary) hypertension: Secondary | ICD-10-CM

## 2019-09-12 DIAGNOSIS — Z801 Family history of malignant neoplasm of trachea, bronchus and lung: Secondary | ICD-10-CM

## 2019-09-12 DIAGNOSIS — Z808 Family history of malignant neoplasm of other organs or systems: Secondary | ICD-10-CM

## 2019-09-12 DIAGNOSIS — Z87891 Personal history of nicotine dependence: Secondary | ICD-10-CM

## 2019-09-12 DIAGNOSIS — Z86718 Personal history of other venous thrombosis and embolism: Secondary | ICD-10-CM

## 2019-09-12 DIAGNOSIS — Z5111 Encounter for antineoplastic chemotherapy: Secondary | ICD-10-CM | POA: Insufficient documentation

## 2019-09-12 DIAGNOSIS — Z7901 Long term (current) use of anticoagulants: Secondary | ICD-10-CM

## 2019-09-12 MED ORDER — PROCHLORPERAZINE MALEATE 10 MG PO TABS: 10.0000 mg | ORAL_TABLET | Freq: Four times a day (QID) | ORAL | 0 refills | Status: DC | PRN

## 2019-09-12 MED ORDER — LIDOCAINE-PRILOCAINE 2.5-2.5 % EX CREA: 1.0000 "application " | TOPICAL_CREAM | CUTANEOUS | 0 refills | Status: DC | PRN

## 2019-09-12 NOTE — Progress Notes (Signed)
Reason for the request:    Bladder cancer  HPI: I was asked by Dr. Gloriann Loan to evaluate Pamela [name] with a new diagnosis of bladder cancer.  She is a 74 year old woman with a history of hyperlipidemia hypertension as well as chronic anticoagulation on warfarin.  She has history of prothrombin gene mutation with recurrent thrombosis and has been on warfarin for many years.  She started developing symptoms of hematuria in January 2021.  She subsequently evaluated by Dr. Gloriann Loan and underwent CT scan of the abdomen and pelvis without contrast on August 08, 2019.  The scan did not show any evidence of nephrolithiasis but did show subtle area of enhancement along the right anterior lateral bladder wall corresponding with mild wall thickening.  Based on these findings he underwent cystoscopy and subsequently TURBT completed on August 28, 2019.  The final pathology showed high-grade urothelial carcinoma with small cell component of 60%, papillary carcinoma with 20% and glandular component of 10%.  The tumor invades into the detrusor muscle.  Based on these findings patient referred to me for evaluation of possible systemic therapy.  Clinically, she reports no recent complaints at this time.  She denies any abdominal pain or hematuria.  She denies any decline in her performance status or activity level.  She continues to live independently and enjoy reasonable performance status.  She does not report any headaches, blurry vision, syncope or seizures. Does not report any fevers, chills or sweats.  Does not report any cough, wheezing or hemoptysis.  Does not report any chest pain, palpitation, orthopnea or leg edema.  Does not report any nausea, vomiting or abdominal pain.  Does not report any constipation or diarrhea.  Does not report any skeletal complaints.    Does not report frequency, urgency or hematuria.  Does not report any skin rashes or lesions. Does not report any heat or cold intolerance.  Does not report any  lymphadenopathy or petechiae.  Does not report any anxiety or depression.  Remaining review of systems is negative.    Past Medical History:  Diagnosis Date  . Cancer ([place])   . CHOLELITHIASIS, WITH OBSTRUCTION 04/21/2006   s/p ERCP,sprincterotomy, stent ([place])  . Complication of anesthesia   . 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 GLAUCOMA 04/24/2008  . HYPERLIPIDEMIA 06/03/2006  . Hypertension   . Hypothyroidism lifelong  . OBESITY, MILD 04/24/2008  . PONV (postoperative nausea and vomiting)   . Tenosynovitis 01/2004   [place]  :  Past Surgical History:  Procedure Laterality Date  . ANKLE ARTHROPLASTY Right 2008  . CHOLECYSTECTOMY     2004  . HAND CONTRACTURE RELEASE Left 1993  . KNEE ARTHROSCOPY Right 2004  . OVARY SURGERY Right 1998  . TRANSURETHRAL RESECTION OF BLADDER TUMOR WITH MITOMYCIN-C N/A 08/28/2019   Procedure: TRANSURETHRAL RESECTION OF BLADDER TUMOR;  Surgeon: Lucas Mallow, MD;  Location: [facility];  Service: Urology;  Laterality: N/A;  :   Current Outpatient Medications:  .  Ascorbic Acid (VITAMIN C PO), Take 1 tablet by mouth every evening., Disp: , Rfl:  .  CALCIUM PO, Take 1 tablet by mouth every evening., Disp: , Rfl:  .  colestipol (COLESTID) 1 g tablet, TAKE 10 TABLETS BY MOUTH EVERY DAY (Patient taking differently: Take 6 g by mouth daily at 12 noon. ), Disp: 900 tablet, Rfl: 3 .  cyanocobalamin 1000 MCG tablet, Take 1,000 mcg by mouth daily., Disp: , Rfl:  .  HYDROcodone-acetaminophen (  NORCO) 5-325 MG tablet, Take 1 tablet by mouth every 4 (four) hours as needed for moderate pain. (Patient not taking: Reported on 09/11/2019), Disp: 6 tablet, Rfl: 0 .  levothyroxine (SYNTHROID) 88 MCG tablet, TAKE ONE (1) TABLET BY MOUTH EVERY DAY (Patient taking differently: Take 88 mcg by mouth daily before breakfast. ), Disp: 90 tablet, Rfl: 3 .  losartan (COZAAR) 25 MG tablet, TAKE ONE (1) TABLET BY MOUTH EVERY DAY (Patient taking  differently: Take 25 mg by mouth daily. ), Disp: 90 tablet, Rfl: 3 .  Multiple Vitamin (MULTIVITAMIN WITH MINERALS) TABS tablet, Take 1 tablet by mouth daily in the afternoon., Disp: , Rfl:  .  pantoprazole (PROTONIX) 40 MG tablet, TAKE 1 TABLET BY MOUTH ONCE DAILY, Disp: 90 tablet, Rfl: 3 .  VITAMIN D PO, Take 1 capsule by mouth every evening., Disp: , Rfl:  .  warfarin (COUMADIN) 4 MG tablet, TAKE ONE TABLET BY MOUTH ON MONDAYS AND FRIDAYS AND TAKE ONE-HALF TABLET ALL OTHER DAYS (Patient taking differently: Take 2 mg by mouth See admin instructions. Take 2 mg daily and 4mg  on Monday), Disp: 65 tablet, Rfl: 11:  Allergies  Allergen Reactions  . Ace Inhibitors Cough  :  Family History  Problem Relation Age of Onset  . Cancer Mother        H&N, smoker  . Cancer Sister        lung, 2011, smoker  . Other Other        grandmother had mastectomy in her 62's unknown reason  . Breast cancer Neg Hx   :  Social History   Socioeconomic History  . Marital status: Widowed    Spouse name: Not on file  . Number of children: Not on file  . Years of education: Not on file  . Highest education level: Not on file  Occupational History  . 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 Smoking status: Former Smoker    Packs/day: 1.00    Years: 20.00    Pack years: 20.00    Quit date: 07/06/1985    Years since quitting: 34.2  . Smokeless tobacco: Former Systems developer  . Tobacco comment: Quit 1988  Substance and Sexual Activity  . Alcohol use: Yes    Comment: 2-3 glasses of wine daily.  . Drug use: No  . Sexual activity: Not on file  Other Topics Concern  . Not on file  Social History Narrative   Married, no regular exercise.   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 Then worked in [employer] with [employer].    Social Determinants of Health   Financial Resource Strain:   . Difficulty  of Paying Living Expenses: Not on file  Food Insecurity:   . Worried About Charity fundraiser in the Last Year: Not on file  . Ran Out of Food in the Last Year: Not on file  Transportation Needs:   . Lack of Transportation (Medical): Not on file  . Lack of Transportation (Non-Medical): Not on file  Physical Activity:   . Days of Exercise per Week: Not on file  . Minutes of Exercise per Session: Not on file  Stress:   . Feeling of Stress : Not on file  Social Connections:   . Frequency of Communication with Friends and Family: Not on file  . Frequency of  Social Gatherings with Friends and Family: Not on file  . Attends Religious Services: Not on file  . Active Member of Clubs or Organizations: Not on file  . Attends Archivist Meetings: Not on file  . Marital Status: Not on file  Intimate Partner Violence:   . Fear of Current or Ex-Partner: Not on file  . Emotionally Abused: Not on file  . Physically Abused: Not on file  . Sexually Abused: Not on file  :  Pertinent items are noted in HPI.  Exam:  Blood pressure (!) 150/69, pulse 87, temperature 99.1 F (37.3 C), temperature source Temporal, resp. rate 20, height 5\' 4"  (1.626 m), weight 185 lb 1.6 oz (84 kg), SpO2 99 %.   ECOG 1 General appearance: alert and cooperative appeared without distress. Head: atraumatic without any abnormalities. Eyes: conjunctivae/corneas clear. PERRL.  Sclera anicteric. Throat: lips, mucosa, and tongue normal; without oral thrush or ulcers. Resp: clear to auscultation bilaterally without rhonchi, wheezes or dullness to percussion. Cardio: regular rate and rhythm, S1, S2 normal, no murmur, click, rub or gallop GI: soft, non-tender; bowel sounds normal; no masses,  no organomegaly Skin: Skin color, texture, turgor normal. No rashes or lesions Lymph nodes: Cervical, supraclavicular, and axillary nodes normal. Neurologic: Grossly normal without any motor, sensory or deep tendon  reflexes. Musculoskeletal: No joint deformity or effusion.  CBC    Component Value Date/Time   WBC 10.9 (H) 08/23/2019 1101   RBC 3.65 (L) 08/23/2019 1101   HGB 12.1 08/23/2019 1101   HGB 13.7 06/13/2019 1520   HCT 37.9 08/23/2019 1101   HCT 41.6 06/13/2019 1520   PLT 341 08/23/2019 1101   PLT 299 06/13/2019 1520   MCV 103.8 (H) 08/23/2019 1101   MCV 100 (H) 06/13/2019 1520   MCH 33.2 08/23/2019 1101   MCHC 31.9 08/23/2019 1101   RDW 13.0 08/23/2019 1101   RDW 11.9 06/13/2019 1520   LYMPHSABS 1.7 06/13/2019 1520   MONOABS 0.8 03/02/2012 1636   EOSABS 0.1 06/13/2019 1520   BASOSABS 0.1 06/13/2019 1520     Chemistry      Component Value Date/Time   NA 139 08/23/2019 1101   NA 143 06/13/2019 1520   K 4.6 08/23/2019 1101   CL 104 08/23/2019 1101   CO2 25 08/23/2019 1101   BUN 13 08/23/2019 1101   BUN 15 06/13/2019 1520   CREATININE 0.77 08/23/2019 1101   CREATININE 0.78 12/13/2014 0954      Component Value Date/Time   CALCIUM 9.3 08/23/2019 1101   ALKPHOS 77 03/02/2012 1636   AST 19 03/02/2012 1636   ALT 15 03/02/2012 1636   BILITOT 0.8 03/02/2012 1636      Assessment and Plan:   74 year old woman with:  1.  Bladder cancer diagnosed in February 2021.  She presented with hematuria and underwent TURBT on February 22.  The pathology showed a muscle invasive fall mixed urothelial carcinoma with small cell component as well as high-grade papillary and glandular component.  The natural course of this disease was reviewed at this time.  She has no clear-cut evidence of metastatic disease based on CT scan imaging of the abdomen and pelvis.  Treatment options would include radical cystectomy, radical cystectomy after neoadjuvant chemotherapy or concomitant therapy was radiation and chemotherapy   The logistics and rationale for using chemotherapy was reviewed today in detail.  Complication associated with cisplatin and gemcitabine chemotherapy was discussed.  These  complications include nausea, vomiting, myelosuppression, fatigue, infusion related complications,  renal insufficiency, neutropenia, neutropenic sepsis and rarely serious thrombosis, hospitalization and death.  The benefit would also if he has an excellent response to chemotherapy, curative surgical resection may be attempted.  The plan is to treat with gemcitabine and cisplatin on day 1, gemcitabine day 8 out of a 21-day cycle.  Anticipate needing 3 cycles of therapy    After discussion today, she is agreeable to proceed after chemo education class.     2.  IV access: Risks and benefits of using Port-A-Cath versus peripheral veins was discussed today.  Complication associated with Port-A-Cath insertion include bleeding, infection and thrombosis.  After discussing the risks and benefits, monitor   3.  Antiemetics: Prescription for Compazine was sent to her pharmacy with instructions how to use it.   4.  Renal function surveillance: We will continue to monitor on cisplatin therapy.  Baseline creatinine remains within normal range.   5.  Goals of care:  Therapy remains curative at this time.  6.  History of recurrent thrombosis: Last thrombosis has not been for many years and currently on warfarin.  Therapy can be interrupted briefly for Port-A-Cath insertion and bladder surgery with low risk of recurrent thrombosis.   7.  Follow-up: will be in the immediate future to start chemotherapy.  60  minutes were dedicated to this visit. The time was spent on reviewing laboratory data, imaging studies, discussing treatment options, and answering questions regarding future plan.     A copy of this consult has been forwarded to the requesting physician.

## 2019-09-12 NOTE — Telephone Encounter (Signed)
Scheduled appt per 3/9 los.  Spoke with pt and she is aware of her appt date and time.

## 2019-09-12 NOTE — Progress Notes (Signed)

## 2019-09-14 ENCOUNTER — Encounter: Payer: Self-pay | Admitting: Internal Medicine

## 2019-09-18 ENCOUNTER — Other Ambulatory Visit: Payer: Self-pay | Admitting: Radiology

## 2019-09-19 ENCOUNTER — Telehealth: Payer: Self-pay | Admitting: Oncology

## 2019-09-19 ENCOUNTER — Inpatient Hospital Stay: Payer: Medicare Other

## 2019-09-19 NOTE — Telephone Encounter (Signed)
Scheduled appt per 3/16 sch message - unable to reach pt- left message with apt date and time

## 2019-09-20 ENCOUNTER — Encounter (HOSPITAL_COMMUNITY): Payer: Self-pay

## 2019-09-20 ENCOUNTER — Ambulatory Visit (HOSPITAL_COMMUNITY)
Admission: RE | Admit: 2019-09-20 | Discharge: 2019-09-20 | Disposition: A | Discharge status: Home / Self Care | Payer: Medicare Other | Source: Ambulatory Visit | Attending: Oncology | Admitting: Oncology

## 2019-09-20 ENCOUNTER — Other Ambulatory Visit: Payer: Self-pay | Admitting: Oncology

## 2019-09-20 ENCOUNTER — Other Ambulatory Visit: Payer: Self-pay

## 2019-09-20 DIAGNOSIS — Z87891 Personal history of nicotine dependence: Secondary | ICD-10-CM | POA: Insufficient documentation

## 2019-09-20 DIAGNOSIS — I10 Essential (primary) hypertension: Secondary | ICD-10-CM | POA: Insufficient documentation

## 2019-09-20 DIAGNOSIS — E785 Hyperlipidemia, unspecified: Secondary | ICD-10-CM | POA: Diagnosis not present

## 2019-09-20 DIAGNOSIS — C679 Malignant neoplasm of bladder, unspecified: Secondary | ICD-10-CM | POA: Diagnosis not present

## 2019-09-20 DIAGNOSIS — Z7901 Long term (current) use of anticoagulants: Secondary | ICD-10-CM | POA: Insufficient documentation

## 2019-09-20 DIAGNOSIS — D682 Hereditary deficiency of other clotting factors: Secondary | ICD-10-CM | POA: Insufficient documentation

## 2019-09-20 DIAGNOSIS — Z79890 Hormone replacement therapy (postmenopausal): Secondary | ICD-10-CM | POA: Diagnosis not present

## 2019-09-20 DIAGNOSIS — E039 Hypothyroidism, unspecified: Secondary | ICD-10-CM | POA: Diagnosis not present

## 2019-09-20 DIAGNOSIS — E669 Obesity, unspecified: Secondary | ICD-10-CM | POA: Insufficient documentation

## 2019-09-20 HISTORY — PX: IR IMAGING GUIDED PORT INSERTION: IMG5740

## 2019-09-20 LAB — CBC WITH DIFFERENTIAL/PLATELET
Abs Immature Granulocytes: 0.03 10*3/uL (ref 0.00–0.07)
Basophils Absolute: 0.1 10*3/uL (ref 0.0–0.1)
Basophils Relative: 1 %
Eosinophils Absolute: 0.2 10*3/uL (ref 0.0–0.5)
Eosinophils Relative: 2 %
HCT: 41 % (ref 36.0–46.0)
Hemoglobin: 13 g/dL (ref 12.0–15.0)
Immature Granulocytes: 0 %
Lymphocytes Relative: 21 %
Lymphs Abs: 1.6 10*3/uL (ref 0.7–4.0)
MCH: 32.9 pg (ref 26.0–34.0)
MCHC: 31.7 g/dL (ref 30.0–36.0)
MCV: 103.8 fL — ABNORMAL HIGH (ref 80.0–100.0)
Monocytes Absolute: 0.6 10*3/uL (ref 0.1–1.0)
Monocytes Relative: 8 %
Neutro Abs: 5.3 10*3/uL (ref 1.7–7.7)
Neutrophils Relative %: 68 %
Platelets: 321 10*3/uL (ref 150–400)
RBC: 3.95 MIL/uL (ref 3.87–5.11)
RDW: 12.5 % (ref 11.5–15.5)
WBC: 7.8 10*3/uL (ref 4.0–10.5)
nRBC: 0 % (ref 0.0–0.2)

## 2019-09-20 LAB — PROTIME-INR
INR: 1.1 (ref 0.8–1.2)
Prothrombin Time: 14.2 seconds (ref 11.4–15.2)

## 2019-09-20 LAB — COMPREHENSIVE METABOLIC PANEL
ALT: 18 U/L (ref 0–44)
AST: 29 U/L (ref 15–41)
Albumin: 4.3 g/dL (ref 3.5–5.0)
Alkaline Phosphatase: 56 U/L (ref 38–126)
Anion gap: 11 (ref 5–15)
BUN: 16 mg/dL (ref 8–23)
CO2: 25 mmol/L (ref 22–32)
Calcium: 9.4 mg/dL (ref 8.9–10.3)
Chloride: 104 mmol/L (ref 98–111)
Creatinine, Ser: 0.75 mg/dL (ref 0.44–1.00)
GFR calc Af Amer: >60 mL/min (ref >60)
GFR calc non Af Amer: >60 mL/min (ref >60)
Glucose, Bld: 102 mg/dL — ABNORMAL HIGH (ref 70–99)
Potassium: 4.1 mmol/L (ref 3.5–5.1)
Sodium: 140 mmol/L (ref 135–145)
Total Bilirubin: 1.3 mg/dL — ABNORMAL HIGH (ref 0.3–1.2)
Total Protein: 7.4 g/dL (ref 6.5–8.1)

## 2019-09-20 MED ORDER — CEFAZOLIN SODIUM-DEXTROSE 2-4 GM/100ML-% IV SOLN
INTRAVENOUS | Status: AC
  Administered 2019-09-20: 2 g via INTRAVENOUS
  Filled 2019-09-20: qty 100

## 2019-09-20 MED ORDER — SODIUM CHLORIDE 0.9 % IV SOLN: INTRAVENOUS | Status: DC

## 2019-09-20 MED ORDER — HEPARIN SOD (PORK) LOCK FLUSH 100 UNIT/ML IV SOLN
INTRAVENOUS | Status: AC
  Filled 2019-09-20: qty 5

## 2019-09-20 MED ORDER — FENTANYL CITRATE (PF) 100 MCG/2ML IJ SOLN
INTRAMUSCULAR | Status: AC
  Filled 2019-09-20: qty 2

## 2019-09-20 MED ORDER — FENTANYL CITRATE (PF) 100 MCG/2ML IJ SOLN
INTRAMUSCULAR | Status: AC | PRN
  Administered 2019-09-20 (×2): 50 ug via INTRAVENOUS

## 2019-09-20 MED ORDER — MIDAZOLAM HCL 2 MG/2ML IJ SOLN
INTRAMUSCULAR | Status: AC
  Filled 2019-09-20: qty 4

## 2019-09-20 MED ORDER — MIDAZOLAM HCL 2 MG/2ML IJ SOLN
INTRAMUSCULAR | Status: AC | PRN
  Administered 2019-09-20: 1 mg via INTRAVENOUS
  Administered 2019-09-20 (×2): 0.5 mg via INTRAVENOUS

## 2019-09-20 MED ORDER — HEPARIN SOD (PORK) LOCK FLUSH 100 UNIT/ML IV SOLN
INTRAVENOUS | Status: AC | PRN
  Administered 2019-09-20: 500 [IU] via INTRAVENOUS

## 2019-09-20 MED ORDER — CEFAZOLIN SODIUM-DEXTROSE 2-4 GM/100ML-% IV SOLN: 2.0000 g | INTRAVENOUS | Status: AC

## 2019-09-20 MED ORDER — LIDOCAINE-EPINEPHRINE 1 %-1:100000 IJ SOLN
INTRAMUSCULAR | Status: AC
  Filled 2019-09-20: qty 1

## 2019-09-20 MED ORDER — LIDOCAINE-EPINEPHRINE (PF) 1 %-1:200000 IJ SOLN
INTRAMUSCULAR | Status: AC | PRN
  Administered 2019-09-20: 10 mL

## 2019-09-20 NOTE — Discharge Instructions (Signed)
Please call Interventional Radiology clinic [phone] with any questions or concerns about your port.  You may remove your dressing and shower tomorrow.  DO NOT use EMLA cream for 2 weeks after port placement as this cream will remove surgical glue on your incision.  Moderate Conscious Sedation, Adult, Care After These instructions provide you with information about caring for yourself after your procedure. Your health care provider may also give you more specific instructions. Your treatment has been planned according to current medical practices, but problems sometimes occur. Call your health care provider if you have any problems or questions after your procedure. What can I expect after the procedure? After your procedure, it is common:  To feel sleepy for several hours.  To feel clumsy and have poor balance for several hours.  To have poor judgment for several hours.  To vomit if you eat too soon. Follow these instructions at home: For at least 24 hours after the procedure:   Do not: ? Participate in activities where you could fall or become injured. ? Drive. ? Use heavy machinery. ? Drink alcohol. ? Take sleeping pills or medicines that cause drowsiness. ? Make important decisions or sign legal documents. ? Take care of children on your own.  Rest. Eating and drinking  Follow the diet recommended by your health care provider.  If you vomit: ? Drink water, juice, or soup when you can drink without vomiting. ? Make sure you have little or no nausea before eating solid foods. General instructions  Have a responsible adult stay with you until you are awake and alert.  Take over-the-counter and prescription medicines only as told by your health care provider.  If you smoke, do not smoke without supervision.  Keep all follow-up visits as told by your health care provider. This is important. Contact a health care provider if:  You keep feeling nauseous or you keep  vomiting.  You feel light-headed.  You develop a rash.  You have a fever. Get help right away if:  You have trouble breathing. This information is not intended to replace advice given to you by your health care provider. Make sure you discuss any questions you have with your health care provider. Document Revised: 06/04/2017 Document Reviewed: 10/12/2015 Elsevier Patient Education  [place] Insertion, Care After This sheet gives you information about how to care for yourself after your procedure. Your health care provider may also give you more specific instructions. If you have problems or questions, contact your health care provider. What can I expect after the procedure? After the procedure, it is common to have:  Discomfort at the port insertion site.  Bruising on the skin over the port. This should improve over 3-4 days. Follow these instructions at home: [facility] care  After your port is placed, you will get a manufacturer's information card. The card has information about your port. Keep this card with you at all times.  Take care of the port as told by your health care provider. Ask your health care provider if you or a family member can get training for taking care of the port at home. A home health care nurse may also take care of the port.  Make sure to remember what type of port you have. Incision care      Follow instructions from your health care provider about how to take care of your port insertion site. Make sure you: ? Wash your hands with soap  and water before and after you change your bandage (dressing). If soap and water are not available, use hand sanitizer. ? Change your dressing as told by your health care provider. ? Leave stitches (sutures), skin glue, or adhesive strips in place. These skin closures may need to stay in place for 2 weeks or longer. If adhesive strip edges start to loosen and curl up, you may trim the loose  edges. Do not remove adhesive strips completely unless your health care provider tells you to do that.  Check your port insertion site every day for signs of infection. Check for: ? Redness, swelling, or pain. ? Fluid or blood. ? Warmth. ? Pus or a bad smell. Activity  Return to your normal activities as told by your health care provider. Ask your health care provider what activities are safe for you.  Do not lift anything that is heavier than 10 lb (4.5 kg), or the limit that you are told, until your health care provider says that it is safe. General instructions  Take over-the-counter and prescription medicines only as told by your health care provider.  Do not take baths, swim, or use a hot tub until your health care provider approves. Ask your health care provider if you may take showers. You may only be allowed to take sponge baths.  Do not drive for 24 hours if you were given a sedative during your procedure.  Wear a medical alert bracelet in case of an emergency. This will tell any health care providers that you have a port.  Keep all follow-up visits as told by your health care provider. This is important. Contact a health care provider if:  You cannot flush your port with saline as directed, or you cannot draw blood from the port.  You have a fever or chills.  You have redness, swelling, or pain around your port insertion site.  You have fluid or blood coming from your port insertion site.  Your port insertion site feels warm to the touch.  You have pus or a bad smell coming from the port insertion site. Get help right away if:  You have chest pain or shortness of breath.  You have bleeding from your port that you cannot control. Summary  Take care of the port as told by your health care provider. Keep the manufacturer's information card with you at all times.  Change your dressing as told by your health care provider.  Contact a health care provider if you  have a fever or chills or if you have redness, swelling, or pain around your port insertion site.  Keep all follow-up visits as told by your health care provider. This information is not intended to replace advice given to you by your health care provider. Make sure you discuss any questions you have with your health care provider. Document Revised: 01/18/2018 Document Reviewed: 01/18/2018 Elsevier Patient Education  [place].

## 2019-09-20 NOTE — Consult Note (Addendum)
Chief Complaint: Patient was seen in consultation today for Port-A-Cath placement  Referring Physician(s): Wyatt Portela  Supervising Physician: Daryll Brod  Patient Status: [facility] - Out-pt  History of Present Illness: Pamela [name] is a 74 y.o. female, ex smoker,  with history of newly diagnosed bladder cancer, status post TURBT on 08/28/2019, who presents today for Port-A-Cath placement for chemotherapy.  Past Medical History:  Diagnosis Date  . Cancer ([place])   . CHOLELITHIASIS, WITH OBSTRUCTION 04/21/2006   s/p ERCP,sprincterotomy, stent ([place])  . Complication of anesthesia   . 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 GLAUCOMA 04/24/2008  . HYPERLIPIDEMIA 06/03/2006  . Hypertension   . Hypothyroidism lifelong  . OBESITY, MILD 04/24/2008  . PONV (postoperative nausea and vomiting)   . Tenosynovitis 01/2004   Sypher    Past Surgical History:  Procedure Laterality Date  . ANKLE ARTHROPLASTY Right 2008  . CHOLECYSTECTOMY     2004  . HAND CONTRACTURE RELEASE Left 1993  . KNEE ARTHROSCOPY Right 2004  . OVARY SURGERY Right 1998  . TRANSURETHRAL RESECTION OF BLADDER TUMOR WITH MITOMYCIN-C N/A 08/28/2019   Procedure: TRANSURETHRAL RESECTION OF BLADDER TUMOR;  Surgeon: Lucas Mallow, MD;  Location: [facility] ORS;  Service: Urology;  Laterality: N/A;    Allergies: Ace inhibitors  Medications: Prior to Admission medications   Medication Sig Start Date End Date Taking? Authorizing Provider  Ascorbic Acid (VITAMIN C PO) Take 1 tablet by mouth every evening.    [provider]  CALCIUM PO Take 1 tablet by mouth every evening.    [provider]  colestipol (COLESTID) 1 g tablet TAKE 10 TABLETS BY MOUTH EVERY DAY Patient taking differently: Take 6 g by mouth daily at 12 noon.  03/16/19   Bartholomew Crews, MD  cyanocobalamin 1000 MCG tablet Take 1,000 mcg by mouth daily.    [provider]    HYDROcodone-acetaminophen (NORCO) 5-325 MG tablet Take 1 tablet by mouth every 4 (four) hours as needed for moderate pain. Patient not taking: Reported on 09/11/2019 08/28/19   Lucas Mallow, MD  levothyroxine (SYNTHROID) 88 MCG tablet TAKE ONE (1) TABLET BY MOUTH EVERY DAY Patient taking differently: Take 88 mcg by mouth daily before breakfast.  01/27/19   Bartholomew Crews, MD  lidocaine-prilocaine (EMLA) cream Apply 1 application topically as needed. 09/12/19   Wyatt Portela, MD  losartan (COZAAR) 25 MG tablet TAKE ONE (1) TABLET BY MOUTH EVERY DAY Patient taking differently: Take 25 mg by mouth daily.  11/18/18   Bartholomew Crews, MD  Multiple Vitamin (MULTIVITAMIN WITH MINERALS) TABS tablet Take 1 tablet by mouth daily in the afternoon.    [provider]  pantoprazole (PROTONIX) 40 MG tablet TAKE 1 TABLET BY MOUTH ONCE DAILY 09/05/19   Bartholomew Crews, MD  prochlorperazine (COMPAZINE) 10 MG tablet Take 1 tablet (10 mg total) by mouth every 6 (six) hours as needed for nausea or vomiting. 09/12/19   Wyatt Portela, MD  VITAMIN D PO Take 1 capsule by mouth every evening.    [provider]  warfarin (COUMADIN) 4 MG tablet TAKE ONE TABLET BY MOUTH ON MONDAYS AND FRIDAYS AND TAKE ONE-HALF TABLET ALL OTHER DAYS Patient taking differently: Take 2 mg by mouth See admin instructions. Take 2 mg daily and 4mg  on Monday 12/20/18   Pennie Banter, RPH-CPP     Family History  Problem Relation Age of Onset  .  Cancer Mother        H&N, smoker  . Cancer Sister        lung, 2011, smoker  . Other Other        grandmother had mastectomy in her 49's unknown reason  . Breast cancer Neg Hx     Social History   Socioeconomic History  . Marital status: Widowed    Spouse name: Not on file  . Number of children: Not on file  . Years of education: Not on file  . Highest education level: Not on file  Occupational History  . 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 Smoking status: Former Smoker    Packs/day: 1.00    Years: 20.00    Pack years: 20.00    Quit date: 07/06/1985    Years since quitting: 34.2  . Smokeless tobacco: Former Systems developer  . Tobacco comment: Quit 1988  Substance and Sexual Activity  . Alcohol use: Yes    Comment: 2-3 glasses of wine daily.  . Drug use: No  . Sexual activity: Not on file  Other Topics Concern  . Not on file  Social History Narrative   Married, no regular exercise.   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 Then worked in [employer] with [employer].    Social Determinants of Health   Financial Resource Strain:   . Difficulty of Paying Living Expenses:   Food Insecurity:   . Worried About Charity fundraiser in the Last Year:   . Arboriculturist in the Last Year:   Transportation Needs:   . Film/video editor (Medical):   [name] Lack of Transportation (Non-Medical):   Physical Activity:   . Days of Exercise per Week:   . Minutes of Exercise per Session:   Stress:   . Feeling of Stress :   Social Connections:   . Frequency of Communication with Friends and Family:   . Frequency of Social Gatherings with Friends and Family:   . Attends Religious Services:   . Active Member of Clubs or Organizations:   . Attends Archivist Meetings:   [name] Marital Status:       Review of Systems she currently denies fever, headache, chest pain, dyspnea, cough, abdominal/back pain, nausea, vomiting or bleeding  Vital Signs: Blood pressure 158/93, temperature 98.3, heart rate 102, respirations 18, O2 sat 98% room air   Physical Exam awake, alert. Chest clear to auscultation bilaterally. Heart with regular rate and rhythm. Abdomen soft, positive bowel sounds, nontender. Pretibial edema noted bilaterally, more so on the right.  Imaging: No results found.  Labs:  CBC: Recent Labs    11/22/18 2136  05/01/19 0917 06/13/19 1520 08/23/19 1101  WBC 15.6* 9.0 9.7 10.9*  HGB 12.7 13.8 13.7 12.1  HCT 39.8 43.4 41.6 37.9  PLT 379 368 299 341    COAGS: Recent Labs    07/31/19 1130 08/21/19 1044 09/04/19 1629 09/11/19 1051  INR 2.1 3.4* 1.3* 2.4    BMP: Recent Labs    11/22/18 2136 05/01/19 0917 06/13/19 1520 08/23/19 1101  NA 138 141 143 139  K 3.9 4.6 4.7 4.6  CL 102 100 103 104  CO2 20* 21 22 25   GLUCOSE 138* 97 93 94  BUN 21 16 15 13   CALCIUM 9.5 9.9 9.8 9.3  CREATININE 1.02* 0.94 0.81 0.77  GFRNONAA 54* 60 71 >60  GFRAA >60 69 82 >60    LIVER FUNCTION TESTS: No results for input(s): BILITOT, AST, ALT, ALKPHOS, PROT, ALBUMIN in the last 8760 hours.  TUMOR MARKERS: No results for input(s): AFPTM, CEA, CA199, CHROMGRNA in the last 8760 hours.  Assessment and Plan: 74 y.o. female, ex smoker,  with history of newly diagnosed bladder cancer, status post TURBT on 08/28/2019, who presents today for Port-A-Cath placement for chemotherapy.Risks and benefits of image guided port-a-catheter placement was discussed with the patient including, but not limited to bleeding, infection, pneumothorax, or fibrin sheath development and need for additional procedures. She also has a history of prothrombin gene mutation with recurrent thrombosis, currently on warfarin but last dose was on 3/12.   All of the patient's questions were answered, patient is agreeable to proceed. Consent signed and in chart.     Thank you for this interesting consult.  I greatly enjoyed meeting Pamela [name] and look forward to participating in their care.  A copy of this report was sent to the requesting provider on this date.  Electronically Signed: D. Rowe Robert, PA-C 09/20/2019, 12:00 PM   I spent a total of  25 minutes   in face to face in clinical consultation, greater than 50% of which was counseling/coordinating care for Port-A-Cath placement

## 2019-09-20 NOTE — Procedures (Signed)
Interventional Radiology Procedure Note  Procedure: RT IJ POWER PORT  Complications: None  Estimated Blood Loss: MIN  Findings: TIP SVCRA       

## 2019-09-21 ENCOUNTER — Inpatient Hospital Stay: Payer: Medicare Other

## 2019-09-21 NOTE — Progress Notes (Signed)
Pharmacist Chemotherapy Monitoring - Initial Assessment    Anticipated start date: 09/27/19    Regimen:  . Are orders appropriate based on the patient's diagnosis, regimen, and cycle? Yes . Does the plan date match the patient's scheduled date? Yes . Is the sequencing of drugs appropriate? Yes . Are the premedications appropriate for the patient's regimen? Yes . Prior Authorization for treatment is: Pending o If applicable, is the correct biosimilar selected based on the patient's insurance? not applicable  Organ Function and Labs: [name] Are dose adjustments needed based on the patient's renal function, hepatic function, or hematologic function? No . Are appropriate labs ordered prior to the start of patient's treatment? Yes . Other organ system assessment, if indicated: cisplatin: baseline audiogram . The following baseline labs, if indicated, have been ordered: cisplatin: K, Mg  Dose Assessment: . Are the drug doses appropriate? Yes . Are the following correct: o Drug concentrations Yes o IV fluid compatible with drug Yes o Administration routes Yes o Timing of therapy Yes . If applicable, does the patient have documented access for treatment and/or plans for port-a-cath placement? yes . If applicable, have lifetime cumulative doses been properly documented and assessed? not applicable Lifetime Dose Tracking  No doses have been documented on this patient for the following tracked chemicals: Doxorubicin, Epirubicin, Idarubicin, Daunorubicin, Mitoxantrone, Bleomycin, Oxaliplatin, Carboplatin, Liposomal Doxorubicin  o   Toxicity Monitoring/Prevention: . The patient has the following take home antiemetics prescribed: Prochlorperazine . The patient has the following take home medications prescribed: N/A . Medication allergies and previous infusion related reactions, if applicable, have been reviewed and addressed. Yes . The patient's current medication list has been assessed for drug-drug  interactions with their chemotherapy regimen.  significant drug-drug interactions were identified on review.  Order Review: . Are the treatment plan orders signed? Yes . Is the patient scheduled to see a provider prior to their treatment? No  I verify that I have reviewed each item in the above checklist and answered each question accordingly.  Romualdo Bolk [facility] 09/21/2019 10:54 AM

## 2019-09-25 ENCOUNTER — Other Ambulatory Visit: Payer: Self-pay

## 2019-09-25 ENCOUNTER — Ambulatory Visit (HOSPITAL_COMMUNITY)
Admission: RE | Admit: 2019-09-25 | Discharge: 2019-09-25 | Disposition: A | Discharge status: Home / Self Care | Payer: Medicare Other | Source: Ambulatory Visit | Attending: Oncology | Admitting: Oncology

## 2019-09-25 ENCOUNTER — Encounter (HOSPITAL_COMMUNITY): Payer: Self-pay

## 2019-09-25 DIAGNOSIS — C679 Malignant neoplasm of bladder, unspecified: Secondary | ICD-10-CM | POA: Diagnosis present

## 2019-09-25 MED ORDER — SODIUM CHLORIDE (PF) 0.9 % IJ SOLN
INTRAMUSCULAR | Status: AC
  Filled 2019-09-25: qty 50

## 2019-09-25 MED ORDER — IOHEXOL 300 MG/ML  SOLN
75.0000 mL | Freq: Once | INTRAMUSCULAR | Status: AC | PRN
  Administered 2019-09-25: 75 mL via INTRAVENOUS

## 2019-09-27 ENCOUNTER — Other Ambulatory Visit: Payer: Self-pay

## 2019-09-27 ENCOUNTER — Inpatient Hospital Stay: Payer: Medicare Other

## 2019-09-27 ENCOUNTER — Encounter: Payer: Self-pay | Admitting: Oncology

## 2019-09-27 VITALS — BP 155/84 | HR 69 | Temp 97.8°F | Resp 17 | Wt 188.8 lb

## 2019-09-27 DIAGNOSIS — C679 Malignant neoplasm of bladder, unspecified: Secondary | ICD-10-CM

## 2019-09-27 DIAGNOSIS — Z5111 Encounter for antineoplastic chemotherapy: Secondary | ICD-10-CM | POA: Diagnosis present

## 2019-09-27 DIAGNOSIS — Z95828 Presence of other vascular implants and grafts: Secondary | ICD-10-CM

## 2019-09-27 LAB — CBC WITH DIFFERENTIAL (CANCER CENTER ONLY)
Abs Immature Granulocytes: 0.04 10*3/uL (ref 0.00–0.07)
Basophils Absolute: 0.1 10*3/uL (ref 0.0–0.1)
Basophils Relative: 1 %
Eosinophils Absolute: 0.2 10*3/uL (ref 0.0–0.5)
Eosinophils Relative: 3 %
HCT: 38.2 % (ref 36.0–46.0)
Hemoglobin: 12.2 g/dL (ref 12.0–15.0)
Immature Granulocytes: 1 %
Lymphocytes Relative: 22 %
Lymphs Abs: 1.7 10*3/uL (ref 0.7–4.0)
MCH: 32.5 pg (ref 26.0–34.0)
MCHC: 31.9 g/dL (ref 30.0–36.0)
MCV: 101.9 fL — ABNORMAL HIGH (ref 80.0–100.0)
Monocytes Absolute: 0.8 10*3/uL (ref 0.1–1.0)
Monocytes Relative: 10 %
Neutro Abs: 4.8 10*3/uL (ref 1.7–7.7)
Neutrophils Relative %: 63 %
Platelet Count: 284 10*3/uL (ref 150–400)
RBC: 3.75 MIL/uL — ABNORMAL LOW (ref 3.87–5.11)
RDW: 12.4 % (ref 11.5–15.5)
WBC Count: 7.6 10*3/uL (ref 4.0–10.5)
nRBC: 0 % (ref 0.0–0.2)

## 2019-09-27 LAB — CMP (CANCER CENTER ONLY)
ALT: 15 U/L (ref 0–44)
AST: 23 U/L (ref 15–41)
Albumin: 3.8 g/dL (ref 3.5–5.0)
Alkaline Phosphatase: 68 U/L (ref 38–126)
Anion gap: 9 (ref 5–15)
BUN: 10 mg/dL (ref 8–23)
CO2: 26 mmol/L (ref 22–32)
Calcium: 9.2 mg/dL (ref 8.9–10.3)
Chloride: 103 mmol/L (ref 98–111)
Creatinine: 0.81 mg/dL (ref 0.44–1.00)
GFR, Est AFR Am: >60 mL/min (ref >60)
GFR, Estimated: >60 mL/min (ref >60)
Glucose, Bld: 119 mg/dL — ABNORMAL HIGH (ref 70–99)
Potassium: 4.3 mmol/L (ref 3.5–5.1)
Sodium: 138 mmol/L (ref 135–145)
Total Bilirubin: 0.6 mg/dL (ref 0.3–1.2)
Total Protein: 6.9 g/dL (ref 6.5–8.1)

## 2019-09-27 MED ORDER — SODIUM CHLORIDE 0.9 % IV SOLN
70.0000 mg/m2 | Freq: Once | INTRAVENOUS | Status: AC
  Administered 2019-09-27: 137 mg via INTRAVENOUS
  Filled 2019-09-27: qty 100

## 2019-09-27 MED ORDER — SODIUM CHLORIDE 0.9% FLUSH
10.0000 mL | INTRAVENOUS | Status: DC | PRN
  Administered 2019-09-27: 10 mL
  Filled 2019-09-27: qty 10

## 2019-09-27 MED ORDER — HEPARIN SOD (PORK) LOCK FLUSH 100 UNIT/ML IV SOLN
500.0000 [IU] | Freq: Once | INTRAVENOUS | Status: AC | PRN
  Administered 2019-09-27: 500 [IU]
  Filled 2019-09-27: qty 5

## 2019-09-27 MED ORDER — DEXAMETHASONE SODIUM PHOSPHATE 10 MG/ML IJ SOLN
INTRAMUSCULAR | Status: AC
  Filled 2019-09-27: qty 1

## 2019-09-27 MED ORDER — SODIUM CHLORIDE 0.9% FLUSH
10.0000 mL | INTRAVENOUS | Status: DC | PRN
  Filled 2019-09-27: qty 10

## 2019-09-27 MED ORDER — DEXAMETHASONE SODIUM PHOSPHATE 10 MG/ML IJ SOLN
10.0000 mg | Freq: Once | INTRAMUSCULAR | Status: AC
  Administered 2019-09-27: 10 mg via INTRAVENOUS

## 2019-09-27 MED ORDER — PALONOSETRON HCL INJECTION 0.25 MG/5ML
INTRAVENOUS | Status: AC
  Filled 2019-09-27: qty 5

## 2019-09-27 MED ORDER — PALONOSETRON HCL INJECTION 0.25 MG/5ML
0.2500 mg | Freq: Once | INTRAVENOUS | Status: AC
  Administered 2019-09-27: 0.25 mg via INTRAVENOUS

## 2019-09-27 MED ORDER — HEPARIN SOD (PORK) LOCK FLUSH 100 UNIT/ML IV SOLN
500.0000 [IU] | Freq: Once | INTRAVENOUS | Status: DC
  Filled 2019-09-27: qty 5

## 2019-09-27 MED ORDER — SODIUM CHLORIDE 0.9 % IV SOLN
150.0000 mg | Freq: Once | INTRAVENOUS | Status: AC
  Administered 2019-09-27: 150 mg via INTRAVENOUS
  Filled 2019-09-27: qty 150

## 2019-09-27 MED ORDER — SODIUM CHLORIDE 0.9 % IV SOLN
2000.0000 mg | Freq: Once | INTRAVENOUS | Status: AC
  Administered 2019-09-27: 2000 mg via INTRAVENOUS
  Filled 2019-09-27: qty 52.6

## 2019-09-27 MED ORDER — POTASSIUM CHLORIDE 2 MEQ/ML IV SOLN
Freq: Once | INTRAVENOUS | Status: AC
  Filled 2019-09-27: qty 10

## 2019-09-27 MED ORDER — SODIUM CHLORIDE 0.9 % IV SOLN
Freq: Once | INTRAVENOUS | Status: AC
  Filled 2019-09-27: qty 250

## 2019-09-27 NOTE — Progress Notes (Signed)
Met with patient in treatment area to introduce myself as Arboriculturist and to offer available resources.  Discussed one-time $1000 Radio broadcast assistant to assist with personal expenses while going through treatment. There are currently no foundations with copay assistance for her diagnosis/insurance.  Gave her my card if interested in applying for grant and for any additional financial questions or concerns.

## 2019-09-27 NOTE — Patient Instructions (Signed)
[facility] Discharge Instructions for Patients Receiving Chemotherapy  Today you received the following chemotherapy agents: Gemzar, Cisplatin  To help prevent nausea and vomiting after your treatment, we encourage you to take your nausea medication as directed.   If you develop nausea and vomiting that is not controlled by your nausea medication, call the clinic.   BELOW ARE SYMPTOMS THAT SHOULD BE REPORTED IMMEDIATELY:  *FEVER GREATER THAN 100.5 F  *CHILLS WITH OR WITHOUT FEVER  NAUSEA AND VOMITING THAT IS NOT CONTROLLED WITH YOUR NAUSEA MEDICATION  *UNUSUAL SHORTNESS OF BREATH  *UNUSUAL BRUISING OR BLEEDING  TENDERNESS IN MOUTH AND THROAT WITH OR WITHOUT PRESENCE OF ULCERS  *URINARY PROBLEMS  *BOWEL PROBLEMS  UNUSUAL RASH Items with * indicate a potential emergency and should be followed up as soon as possible.  Feel free to call the clinic should you have any questions or concerns. The clinic phone number is [phone].  Please show the [facility] at check-in to the Emergency Department and triage nurse.  Gemcitabine injection What is this medicine? GEMCITABINE (jem SYE ta been) is a chemotherapy drug. This medicine is used to treat many types of cancer like breast cancer, lung cancer, pancreatic cancer, and ovarian cancer. This medicine may be used for other purposes; ask your health care provider or pharmacist if you have questions. COMMON BRAND NAME(S): Gemzar, Infugem What should I tell my health care provider before I take this medicine? They need to know if you have any of these conditions:  blood disorders  infection  kidney disease  liver disease  lung or breathing disease, like asthma  recent or ongoing radiation therapy  an unusual or allergic reaction to gemcitabine, other chemotherapy, other medicines, foods, dyes, or preservatives  pregnant or trying to get pregnant  breast-feeding How should I use this  medicine? This drug is given as an infusion into a vein. It is administered in a hospital or clinic by a specially trained health care professional. Talk to your pediatrician regarding the use of this medicine in children. Special care may be needed. Overdosage: If you think you have taken too much of this medicine contact a poison control center or emergency room at once. NOTE: This medicine is only for you. Do not share this medicine with others. What if I miss a dose? It is important not to miss your dose. Call your doctor or health care professional if you are unable to keep an appointment. What may interact with this medicine?  medicines to increase blood counts like filgrastim, pegfilgrastim, sargramostim  some other chemotherapy drugs like cisplatin  vaccines Talk to your doctor or health care professional before taking any of these medicines:  acetaminophen  aspirin  ibuprofen  ketoprofen  naproxen This list may not describe all possible interactions. Give your health care provider a list of all the medicines, herbs, non-prescription drugs, or dietary supplements you use. Also tell them if you smoke, drink alcohol, or use illegal drugs. Some items may interact with your medicine. What should I watch for while using this medicine? Visit your doctor for checks on your progress. This drug may make you feel generally unwell. This is not uncommon, as chemotherapy can affect healthy cells as well as cancer cells. Report any side effects. Continue your course of treatment even though you feel ill unless your doctor tells you to stop. In some cases, you may be given additional medicines to help with side effects. Follow all directions for their use.  use. °Call your doctor or health care professional for advice if you get a fever, chills or sore throat, or other symptoms of a cold or flu. Do not treat yourself. This drug decreases your body's ability to fight infections. Try to avoid being  around people who are sick. °This medicine may increase your risk to bruise or bleed. Call your doctor or health care professional if you notice any unusual bleeding. °Be careful brushing and flossing your teeth or using a toothpick because you may get an infection or bleed more easily. If you have any dental work done, tell your dentist you are receiving this medicine. °Avoid taking products that contain aspirin, acetaminophen, ibuprofen, naproxen, or ketoprofen unless instructed by your doctor. These medicines may hide a fever. °Do not become pregnant while taking this medicine or for 6 months after stopping it. Women should inform their doctor if they wish to become pregnant or think they might be pregnant. Men should not father a child while taking this medicine and for 3 months after stopping it. There is a potential for serious side effects to an unborn child. Talk to your health care professional or pharmacist for more information. Do not breast-feed an infant while taking this medicine or for at least 1 week after stopping it. °Men should inform their doctors if they wish to father a child. This medicine may lower sperm counts. Talk with your doctor or health care professional if you are concerned about your fertility. °What side effects may I notice from receiving this medicine? °Side effects that you should report to your doctor or health care professional as soon as possible: °· allergic reactions like skin rash, itching or hives, swelling of the face, lips, or tongue °· breathing problems °· pain, redness, or irritation at site where injected °· signs and symptoms of a dangerous change in heartbeat or heart rhythm like chest pain; dizziness; fast or irregular heartbeat; palpitations; feeling faint or lightheaded, falls; breathing problems °· signs of decreased platelets or bleeding - bruising, pinpoint red spots on the skin, black, tarry stools, blood in the urine °· signs of decreased red blood cells -  unusually weak or tired, feeling faint or lightheaded, falls °· signs of infection - fever or chills, cough, sore throat, pain or difficulty passing urine °· signs and symptoms of kidney injury like trouble passing urine or change in the amount of urine °· signs and symptoms of liver injury like dark yellow or brown urine; general ill feeling or flu-like symptoms; light-colored stools; loss of appetite; nausea; right upper belly pain; unusually weak or tired; yellowing of the eyes or skin °· swelling of ankles, feet, hands °Side effects that usually do not require medical attention (report to your doctor or health care professional if they continue or are bothersome): °· constipation °· diarrhea °· hair loss °· loss of appetite °· nausea °· rash °· vomiting °This list may not describe all possible side effects. Call your doctor for medical advice about side effects. You may report side effects to FDA at 1-800-FDA-1088. °Where should I keep my medicine? °This drug is given in a hospital or clinic and will not be stored at home. °NOTE: This sheet is a summary. It may not cover all possible information. If you have questions about this medicine, talk to your doctor, pharmacist, or health care provider. °© 2020 Elsevier/Gold Standard (2017-09-15 18:06:11) ° °Cisplatin injection °What is this medicine? °CISPLATIN (SIS pla tin) is a chemotherapy drug. It targets fast dividing   cells, like cancer cells, and causes these cells to die. This medicine is used to treat many types of cancer like bladder, ovarian, and testicular cancers. °This medicine may be used for other purposes; ask your health care provider or pharmacist if you have questions. °COMMON BRAND NAME(S): Platinol, Platinol -AQ °What should I tell my health care provider before I take this medicine? °They need to know if you have any of these conditions: °· eye disease, vision problems °· hearing problems °· kidney disease °· low blood counts, like white cells,  platelets, or red blood cells °· tingling of the fingers or toes, or other nerve disorder °· an unusual or allergic reaction to cisplatin, carboplatin, oxaliplatin, other medicines, foods, dyes, or preservatives °· pregnant or trying to get pregnant °· breast-feeding °How should I use this medicine? °This drug is given as an infusion into a vein. It is administered in a hospital or clinic by a specially trained health care professional. °Talk to your pediatrician regarding the use of this medicine in children. Special care may be needed. °Overdosage: If you think you have taken too much of this medicine contact a poison control center or emergency room at once. °NOTE: This medicine is only for you. Do not share this medicine with others. °What if I miss a dose? °It is important not to miss a dose. Call your doctor or health care professional if you are unable to keep an appointment. °What may interact with this medicine? °This medicine may interact with the following medications: °· foscarnet °· certain antibiotics like amikacin, gentamicin, neomycin, polymyxin B, streptomycin, tobramycin, vancomycin °This list may not describe all possible interactions. Give your health care provider a list of all the medicines, herbs, non-prescription drugs, or dietary supplements you use. Also tell them if you smoke, drink alcohol, or use illegal drugs. Some items may interact with your medicine. °What should I watch for while using this medicine? °Your condition will be monitored carefully while you are receiving this medicine. You will need important blood work done while you are taking this medicine. °This drug may make you feel generally unwell. This is not uncommon, as chemotherapy can affect healthy cells as well as cancer cells. Report any side effects. Continue your course of treatment even though you feel ill unless your doctor tells you to stop. °This medicine may increase your risk of getting an infection. Call your  healthcare professional for advice if you get a fever, chills, or sore throat, or other symptoms of a cold or flu. Do not treat yourself. Try to avoid being around people who are sick. °Avoid taking medicines that contain aspirin, acetaminophen, ibuprofen, naproxen, or ketoprofen unless instructed by your healthcare professional. These medicines may hide a fever. °This medicine may increase your risk to bruise or bleed. Call your doctor or health care professional if you notice any unusual bleeding. °Be careful brushing and flossing your teeth or using a toothpick because you may get an infection or bleed more easily. If you have any dental work done, tell your dentist you are receiving this medicine. °Do not become pregnant while taking this medicine or for 14 months after stopping it. Women should inform their healthcare professional if they wish to become pregnant or think they might be pregnant. Men should not father a child while taking this medicine and for 11 months after stopping it. There is potential for serious side effects to an unborn child. Talk to your healthcare professional for more information. °Do not   an infant while taking this medicine. This medicine has caused ovarian failure in some women. This medicine may make it more difficult to get pregnant. Talk to your healthcare professional if you are concerned about your fertility. This medicine has caused decreased sperm counts in some men. This may make it more difficult to father a child. Talk to your healthcare professional if you are concerned about your fertility. Drink fluids as directed while you are taking this medicine. This will help protect your kidneys. Call your doctor or health care professional if you get diarrhea. Do not treat yourself. What side effects may I notice from receiving this medicine? Side effects that you should report to your doctor or health care professional as soon as possible:  allergic reactions  like skin rash, itching or hives, swelling of the face, lips, or tongue  blurred vision  changes in vision  decreased hearing or ringing of the ears  nausea, vomiting  pain, redness, or irritation at site where injected  pain, tingling, numbness in the hands or feet  signs and symptoms of bleeding such as bloody or black, tarry stools; red or dark brown urine; spitting up blood or brown material that looks like coffee grounds; red spots on the skin; unusual bruising or bleeding from the eyes, gums, or nose  signs and symptoms of infection like fever; chills; cough; sore throat; pain or trouble passing urine  signs and symptoms of kidney injury like trouble passing urine or change in the amount of urine  signs and symptoms of low red blood cells or anemia such as unusually weak or tired; feeling faint or lightheaded; falls; breathing problems Side effects that usually do not require medical attention (report to your doctor or health care professional if they continue or are bothersome):  loss of appetite  mouth sores  muscle cramps This list may not describe all possible side effects. Call your doctor for medical advice about side effects. You may report side effects to FDA at 1-800-FDA-1088. Where should I keep my medicine? This drug is given in a hospital or clinic and will not be stored at home. NOTE: This sheet is a summary. It may not cover all possible information. If you have questions about this medicine, talk to your doctor, pharmacist, or health care provider.  2020 Elsevier/Gold Standard (2018-06-17 15:59:17)   Central Line, Adult A central line is a thin, flexible tube (catheter) that is put in your vein. It can be used to:  Give you medicine.  Give you food and nutrients. Follow these instructions at home: Caring for the tube   Follow instructions from your doctor about: ? Flushing the tube with saline solution. ? Cleaning the tube and the area around  it.  Only flush with clean (sterile) supplies. The supplies should be from your doctor, a pharmacy, or another place that your doctor recommends.  Before you flush the tube or clean the area around the tube: ? Wash your hands with soap and water. If you cannot use soap and water, use hand sanitizer. ? Clean the central line hub with rubbing alcohol. Caring for your skin  Keep the area where the tube was put in clean and dry.  Every day, and when changing the bandage, check the skin around the central line for: ? Redness, swelling, or pain. ? Fluid or blood. ? Warmth. ? Pus. ? A bad smell. General instructions  Keep the tube clamped, unless it is being used.  Keep your supplies in a  clean, dry location.  If you or someone else accidentally pulls on the tube, make sure: ? The bandage (dressing) is okay. ? There is no bleeding. ? The tube has not been pulled out.  Do not use scissors or sharp objects near the tube.  Do not swim or let the tube soak in a tub.  Ask your doctor what activities are safe for you. Your doctor may tell you not to lift anything or move your arm too much.  Take over-the-counter and prescription medicines only as told by your doctor.  Change bandages as told by your doctor.  Keep your bandage dry. If a bandage gets wet, have it changed right away.  Keep all follow-up visits as told by your doctor. This is important. Throwing away supplies  Throw away any syringes in a trash (disposal) container that is only for sharp items (sharps container). You can buy a sharps container from a pharmacy, or you can make one by using an empty hard plastic bottle with a cover.  Place any used bandages or infusion bags into a plastic bag. Throw that bag in the trash. Contact a doctor if:  You have any of these where the tube was put in: ? Redness, swelling, or pain. ? Fluid or blood. ? A warm feeling. ? Pus or a bad smell. Get help right away if:  You  have: ? A fever. ? Chills. ? Trouble getting enough air (shortness of breath). ? Trouble breathing. ? Pain in your chest. ? Swelling in your neck, face, chest, or arm.  You are coughing.  You feel your heart beating fast or skipping beats.  You feel dizzy or you pass out (faint).  There are red lines coming from where the tube was put in.  The area where the tube was put in is bleeding and the bleeding will not stop.  Your tube is hard to flush.  You do not get a blood return from the tube.  The tube gets loose or comes out.  The tube has a hole or a tear.  The tube leaks. Summary  A central line is a thin, flexible tube (catheter) that is put in your vein. It can be used to take blood for lab tests or to give you medicine.  Follow instructions from your doctor about flushing and cleaning the tube.  Keep the area where the tube was put in clean and dry.  Ask your doctor what activities are safe for you. This information is not intended to replace advice given to you by your health care provider. Make sure you discuss any questions you have with your health care provider. Document Revised: 10/12/2018 Document Reviewed: 07/09/2016 Elsevier Patient Education  Spr[place].

## 2019-09-28 ENCOUNTER — Telehealth: Payer: Self-pay | Admitting: *Deleted

## 2019-10-02 ENCOUNTER — Ambulatory Visit (INDEPENDENT_AMBULATORY_CARE_PROVIDER_SITE_OTHER): Payer: Medicare Other | Admitting: Pharmacist

## 2019-10-02 DIAGNOSIS — Z86718 Personal history of other venous thrombosis and embolism: Secondary | ICD-10-CM

## 2019-10-02 DIAGNOSIS — D6869 Other thrombophilia: Secondary | ICD-10-CM | POA: Diagnosis not present

## 2019-10-02 DIAGNOSIS — Z7901 Long term (current) use of anticoagulants: Secondary | ICD-10-CM | POA: Diagnosis not present

## 2019-10-02 DIAGNOSIS — Z5181 Encounter for therapeutic drug level monitoring: Secondary | ICD-10-CM

## 2019-10-02 LAB — POCT INR: INR: 1.7 — AB (ref 2.0–3.0)

## 2019-10-02 NOTE — Patient Instructions (Signed)
Patient instructed to take medications as defined in the Anti-coagulation Track section of this encounter.  Patient instructed to take today's dose.  Patient instructed to take 4mg  of warfarin on Tuesdays and Thursdays. Take 2mg  on all other days. Patient verbalized understanding of these instructions.

## 2019-10-02 NOTE — Progress Notes (Signed)
Anticoagulation Management Pamela [name] is a 74 y.o. female who reports to the clinic for monitoring of warfarin treatment.    Indication: Secondary hypecoagulable state with history of VTE; Long term current use of warfarin anticoagulant.    Duration: indefinite Supervising physician: Addison Naegeli, MD  Anticoagulation Clinic Visit History: Patient does not report signs/symptoms of bleeding or thromboembolism  Other recent changes: No diet, medications, lifestyle changes except as noted in patient findings.  Anticoagulation Episode Summary    Current INR goal:  2.0-3.0  TTR:  69.0 % (9.1 y)  Next INR check:  10/16/2019  INR from last check:  1.7 (10/02/2019)  Weekly max warfarin dose:    Target end date:  Indefinite  INR check location:  Anticoagulation Clinic  Preferred lab:    Send INR reminders to:  [facility]   Indications   Secondary hypercoagulable state ([facility]) [D68.69] DVT HX OF (Resolved) [id number]       Comments:        Anticoagulation Care Providers    Provider Role Specialty Phone number   Burman Freestone, MD  Internal Medicine [phone]      Allergies  Allergen Reactions  . Ace Inhibitors Cough    Current Outpatient Medications:  .  Ascorbic Acid (VITAMIN C PO), Take 1 tablet by mouth every evening., Disp: , Rfl:  .  CALCIUM PO, Take 1 tablet by mouth every evening., Disp: , Rfl:  .  colestipol (COLESTID) 1 g tablet, TAKE 10 TABLETS BY MOUTH EVERY DAY (Patient taking differently: Take 6 g by mouth daily at 12 noon. ), Disp: 900 tablet, Rfl: 3 .  cyanocobalamin 1000 MCG tablet, Take 1,000 mcg by mouth daily., Disp: , Rfl:  .  levothyroxine (SYNTHROID) 88 MCG tablet, TAKE ONE (1) TABLET BY MOUTH EVERY DAY (Patient taking differently: Take 88 mcg by mouth daily before breakfast. ), Disp: 90 tablet, Rfl: 3 .  lidocaine-prilocaine (EMLA) cream, Apply 1 application topically as needed., Disp: 30 g, Rfl: 0 .  losartan (COZAAR) 25 MG tablet, TAKE  ONE (1) TABLET BY MOUTH EVERY DAY (Patient taking differently: Take 25 mg by mouth daily. ), Disp: 90 tablet, Rfl: 3 .  Multiple Vitamin (MULTIVITAMIN WITH MINERALS) TABS tablet, Take 1 tablet by mouth daily in the afternoon., Disp: , Rfl:  .  pantoprazole (PROTONIX) 40 MG tablet, TAKE 1 TABLET BY MOUTH ONCE DAILY, Disp: 90 tablet, Rfl: 3 .  VITAMIN D PO, Take 1 capsule by mouth every evening., Disp: , Rfl:  .  warfarin (COUMADIN) 4 MG tablet, TAKE ONE TABLET BY MOUTH ON MONDAYS AND FRIDAYS AND TAKE ONE-HALF TABLET ALL OTHER DAYS (Patient taking differently: Take 2 mg by mouth See admin instructions. Take 2 mg daily and 4mg  on Monday), Disp: 65 tablet, Rfl: 11 .  HYDROcodone-acetaminophen (NORCO) 5-325 MG tablet, Take 1 tablet by mouth every 4 (four) hours as needed for moderate pain. (Patient not taking: Reported on 09/11/2019), Disp: 6 tablet, Rfl: 0 .  prochlorperazine (COMPAZINE) 10 MG tablet, Take 1 tablet (10 mg total) by mouth every 6 (six) hours as needed for nausea or vomiting. (Patient not taking: Reported on 10/02/2019), Disp: 30 tablet, Rfl: 0 Past Medical History:  Diagnosis Date  . bladder ca dx'd 09/05/2019  . CHOLELITHIASIS, WITH OBSTRUCTION 04/21/2006   s/p ERCP,sprincterotomy, stent ([place])  . Complication of anesthesia   . 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 GLAUCOMA 04/24/2008  . HYPERLIPIDEMIA 06/03/2006  .  Hypertension   . Hypothyroidism lifelong  . OBESITY, MILD 04/24/2008  . PONV (postoperative nausea and vomiting)   . Tenosynovitis 01/2004   [place]   Social History   Socioeconomic History  . Marital status: Widowed    Spouse name: Not on file  . Number of children: Not on file  . Years of education: Not on file  . Highest education level: Not on file  Occupational History  . 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 Smoking status: Former Smoker     Packs/day: 1.00    Years: 20.00    Pack years: 20.00    Quit date: 07/06/1985    Years since quitting: 34.2  . Smokeless tobacco: Former Systems developer  . Tobacco comment: Quit 1988  Substance and Sexual Activity  . Alcohol use: Yes    Comment: 2-3 glasses of wine daily.  . Drug use: No  . Sexual activity: Not on file  Other Topics Concern  . Not on file  Social History Narrative   Married, no regular exercise.   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 Then worked in [employer] with [employer].    Social Determinants of Health   Financial Resource Strain:   . Difficulty of Paying Living Expenses:   Food Insecurity:   . Worried About Charity fundraiser in the Last Year:   . Arboriculturist in the Last Year:   Transportation Needs:   . Film/video editor (Medical):   [name] Lack of Transportation (Non-Medical):   Physical Activity:   . Days of Exercise per Week:   . Minutes of Exercise per Session:   Stress:   . Feeling of Stress :   Social Connections:   . Frequency of Communication with Friends and Family:   . Frequency of Social Gatherings with Friends and Family:   . Attends Religious Services:   . Active Member of Clubs or Organizations:   . Attends Archivist Meetings:   [name] Marital Status:    Family History  Problem Relation Age of Onset  . Cancer Mother        H&N, smoker  . Cancer Sister        lung, 2011, smoker  . Other Other        grandmother had mastectomy in her 61's unknown reason  . Breast cancer Neg Hx     ASSESSMENT Recent Results: The most recent result is correlated with 16 mg per week: Lab Results  Component Value Date   INR 1.7 (A) 10/02/2019   INR 1.1 09/20/2019   INR 2.4 09/11/2019    Anticoagulation Dosing: Description   Take 4mg  of warfarin on Tuesdays and Thursdays. Take 2mg  on all other days.      INR today: Subtherapeutic  PLAN Weekly dose was increased by 12% to 18 mg per  week  Patient Instructions  Patient instructed to take medications as defined in the Anti-coagulation Track section of this encounter.  Patient instructed to take today's dose.  Patient instructed to take 4mg  of warfarin on Tuesdays and Thursdays. Take 2mg  on all other days. Patient verbalized understanding of these instructions.    Patient advised to contact clinic or seek medical attention if signs/symptoms of bleeding or thromboembolism occur.  Patient verbalized understanding by repeating back information and was advised to contact  me if further medication-related questions arise. Patient was also provided an information handout.  Follow-up Return in 2 weeks (on 10/16/2019) for Follow up INR.  Pennie Banter, PharmD, CPP  15 minutes spent face-to-face with the patient during the encounter. 50% of time spent on education, including signs/sx bleeding and clotting, as well as food and drug interactions with warfarin. 50% of time was spent on fingerprick POC INR sample collection,processing, results determination, and documentation in [email or web address]

## 2019-10-02 NOTE — Progress Notes (Signed)
INTERNAL MEDICINE TEACHING ATTENDING ADDENDUM   I agree with pharmacy recommendations as outlined in their note.   Gwendolyne Welford, MD  

## 2019-10-03 ENCOUNTER — Inpatient Hospital Stay: Payer: Medicare Other

## 2019-10-03 ENCOUNTER — Other Ambulatory Visit: Payer: Self-pay

## 2019-10-03 VITALS — BP 138/76 | HR 66 | Temp 98.7°F | Resp 18

## 2019-10-03 DIAGNOSIS — Z5111 Encounter for antineoplastic chemotherapy: Secondary | ICD-10-CM | POA: Diagnosis not present

## 2019-10-03 DIAGNOSIS — C679 Malignant neoplasm of bladder, unspecified: Secondary | ICD-10-CM

## 2019-10-03 DIAGNOSIS — Z95828 Presence of other vascular implants and grafts: Secondary | ICD-10-CM

## 2019-10-03 LAB — CBC WITH DIFFERENTIAL (CANCER CENTER ONLY)
Abs Immature Granulocytes: 0.01 10*3/uL (ref 0.00–0.07)
Basophils Absolute: 0 10*3/uL (ref 0.0–0.1)
Basophils Relative: 1 %
Eosinophils Absolute: 0.2 10*3/uL (ref 0.0–0.5)
Eosinophils Relative: 4 %
HCT: 37.6 % (ref 36.0–46.0)
Hemoglobin: 12.4 g/dL (ref 12.0–15.0)
Immature Granulocytes: 0 %
Lymphocytes Relative: 29 %
Lymphs Abs: 1.1 10*3/uL (ref 0.7–4.0)
MCH: 32.8 pg (ref 26.0–34.0)
MCHC: 33 g/dL (ref 30.0–36.0)
MCV: 99.5 fL (ref 80.0–100.0)
Monocytes Absolute: 0.1 10*3/uL (ref 0.1–1.0)
Monocytes Relative: 3 %
Neutro Abs: 2.3 10*3/uL (ref 1.7–7.7)
Neutrophils Relative %: 63 %
Platelet Count: 179 10*3/uL (ref 150–400)
RBC: 3.78 MIL/uL — ABNORMAL LOW (ref 3.87–5.11)
RDW: 11.9 % (ref 11.5–15.5)
WBC Count: 3.7 10*3/uL — ABNORMAL LOW (ref 4.0–10.5)
nRBC: 0 % (ref 0.0–0.2)

## 2019-10-03 LAB — CMP (CANCER CENTER ONLY)
ALT: 84 U/L — ABNORMAL HIGH (ref 0–44)
AST: 69 U/L — ABNORMAL HIGH (ref 15–41)
Albumin: 3.8 g/dL (ref 3.5–5.0)
Alkaline Phosphatase: 64 U/L (ref 38–126)
Anion gap: 10 (ref 5–15)
BUN: 14 mg/dL (ref 8–23)
CO2: 29 mmol/L (ref 22–32)
Calcium: 9 mg/dL (ref 8.9–10.3)
Chloride: 99 mmol/L (ref 98–111)
Creatinine: 1.09 mg/dL — ABNORMAL HIGH (ref 0.44–1.00)
GFR, Est AFR Am: 58 mL/min — ABNORMAL LOW (ref >60)
GFR, Estimated: 50 mL/min — ABNORMAL LOW (ref >60)
Glucose, Bld: 111 mg/dL — ABNORMAL HIGH (ref 70–99)
Potassium: 4.6 mmol/L (ref 3.5–5.1)
Sodium: 138 mmol/L (ref 135–145)
Total Bilirubin: 0.4 mg/dL (ref 0.3–1.2)
Total Protein: 7 g/dL (ref 6.5–8.1)

## 2019-10-03 MED ORDER — PROCHLORPERAZINE MALEATE 10 MG PO TABS
10.0000 mg | ORAL_TABLET | Freq: Once | ORAL | Status: AC
  Administered 2019-10-03: 10:00:00 10 mg via ORAL

## 2019-10-03 MED ORDER — SODIUM CHLORIDE 0.9 % IV SOLN
2000.0000 mg | Freq: Once | INTRAVENOUS | Status: AC
  Administered 2019-10-03: 10:00:00 2000 mg via INTRAVENOUS
  Filled 2019-10-03: qty 52.6

## 2019-10-03 MED ORDER — SODIUM CHLORIDE 0.9% FLUSH
10.0000 mL | INTRAVENOUS | Status: DC | PRN
  Administered 2019-10-03: 10 mL via INTRAVENOUS
  Filled 2019-10-03: qty 10

## 2019-10-03 MED ORDER — SODIUM CHLORIDE 0.9 % IV SOLN
Freq: Once | INTRAVENOUS | Status: AC
  Filled 2019-10-03: qty 250

## 2019-10-03 MED ORDER — SODIUM CHLORIDE 0.9% FLUSH
10.0000 mL | INTRAVENOUS | Status: DC | PRN
  Administered 2019-10-03: 11:00:00 10 mL
  Filled 2019-10-03: qty 10

## 2019-10-03 MED ORDER — HEPARIN SOD (PORK) LOCK FLUSH 100 UNIT/ML IV SOLN
500.0000 [IU] | Freq: Once | INTRAVENOUS | Status: AC | PRN
  Administered 2019-10-03: 500 [IU]
  Filled 2019-10-03: qty 5

## 2019-10-03 MED ORDER — PROCHLORPERAZINE MALEATE 10 MG PO TABS
ORAL_TABLET | ORAL | Status: AC
  Filled 2019-10-03: qty 1

## 2019-10-03 NOTE — Patient Instructions (Signed)
[facility] °Discharge Instructions for Patients Receiving Chemotherapy ° °Today you received the following chemotherapy agents Gemzar ° °To help prevent nausea and vomiting after your treatment, we encourage you to take your nausea medication as directed. °  °If you develop nausea and vomiting that is not controlled by your nausea medication, call the clinic.  ° °BELOW ARE SYMPTOMS THAT SHOULD BE REPORTED IMMEDIATELY: °· *FEVER GREATER THAN 100.5 F °· *CHILLS WITH OR WITHOUT FEVER °· NAUSEA AND VOMITING THAT IS NOT CONTROLLED WITH YOUR NAUSEA MEDICATION °· *UNUSUAL SHORTNESS OF BREATH °· *UNUSUAL BRUISING OR BLEEDING °· TENDERNESS IN MOUTH AND THROAT WITH OR WITHOUT PRESENCE OF ULCERS °· *URINARY PROBLEMS °· *BOWEL PROBLEMS °· UNUSUAL RASH °Items with * indicate a potential emergency and should be followed up as soon as possible. ° °Feel free to call the clinic should you have any questions or concerns. The clinic phone number is [phone]. ° °Please show the CHEMO ALERT CARD at check-in to the Emergency Department and triage nurse. ° ° °

## 2019-10-03 NOTE — Progress Notes (Signed)
Per Dr. Alen Blew, [place] to proceed with treatment with today's labs.

## 2019-10-05 ENCOUNTER — Encounter: Payer: Self-pay | Admitting: *Deleted

## 2019-10-05 NOTE — Progress Notes (Signed)
Didn't she have an-AWV scheduled December 14?  I remember doing her prework before that appointment.

## 2019-10-05 NOTE — Progress Notes (Unsigned)

## 2019-10-05 NOTE — Progress Notes (Unsigned)

## 2019-10-09 ENCOUNTER — Encounter: Payer: Self-pay | Admitting: Internal Medicine

## 2019-10-09 NOTE — Progress Notes (Signed)
Things That May Be Affecting Your Health: x Alcohol  Hearing loss  Pain    Depression  Home Safety  Sexual Health   Diabetes  Lack of physical activity x Stress (recent cancer dx)   Difficulty with daily activities  Loneliness  Tiredness   Drug use  Medicines  Tobacco use   Falls  Motor Vehicle Safety  Weight   Food choices  Oral Health  Other    YOUR PERSONALIZED HEALTH PLAN : 1. Schedule your next subsequent Medicare Wellness visit in one year 2. Attend all of your regular appointments to address your medical issues 3. Complete the preventative screenings and services   Annual Wellness Visit   Medicare Covered Preventative Screenings and [place] Men and Women Who How Often Need? Date of Last Service Action  Abdominal Aortic Aneurysm Adults with AAA risk factors Once     Alcohol Misuse and Counseling All Adults Screening once a year if no alcohol misuse. Counseling up to 4 face to face sessions. Y    Bone Density Measurement  Adults at risk for osteoporosis Once every 2 yrs     Lipid Panel Z13.6 All adults without CV disease Once every 5 yrs     Colorectal Cancer   Stool sample or  Colonoscopy All adults 38 and older   Once every year  Every 10 years     Depression All Adults Once a year  Today   Diabetes Screening Blood glucose, post glucose load, or GTT Z13.1  All adults at risk  Pre-diabetics  Once per year  Twice per year     Diabetes  Self-Management Training All adults Diabetics 10 hrs first year; 2 hours subsequent years. Requires Copay     Glaucoma  Diabetics  Family history of glaucoma  African Americans 11 yrs +  Hispanic Americans 6 yrs + Annually - requires coppay     Hepatitis C Z72.89 or F19.20  High Risk for HCV  Born between 1945 and 1965  Annually  Once     HIV Z11.4 All adults based on risk  Annually btw ages 75 & 109 regardless of risk  Annually > 65 yrs if at increased risk     Lung Cancer Screening  Asymptomatic adults aged 66-77 with 30 pack yr history and current smoker OR quit within the last 15 yrs Annually Must have counseling and shared decision making documentation before first screen     Medical Nutrition Therapy Adults with   Diabetes  Renal disease  Kidney transplant within past 3 yrs 3 hours first year; 2 hours subsequent years     Obesity and Counseling All adults Screening once a year Counseling if BMI 30 or higher  Today   Tobacco Use Counseling Adults who use tobacco  Up to 8 visits in one year     Vaccines Z23  Hepatitis B  Influenza   Pneumonia  Adults   Once  Once every flu season  Two different vaccines separated by one year     Next Annual Wellness Visit People with Medicare Every year  Today     Services & Screenings Women Who How Often Need  Date of Last Service Action  Mammogram  Z12.31 Women over 47 One baseline ages 65-39. Annually ager 40 yrs+     Pap tests All women Annually if high risk. Every 2 yrs for normal risk women     Screening for cervical cancer with   Pap (Z01.419 nl or  Z01.411abnl) &  HPV Z11.51 Women aged 68 to 58 Once every 5 yrs     Screening pelvic and breast exams All women Annually if high risk. Every 2 yrs for normal risk women     Sexually Transmitted Diseases  Chlamydia  Gonorrhea  Syphilis All at risk adults Annually for non pregnant females at increased risk         [place] Men Who How Ofter Need  Date of Last Service Action  Prostate Cancer - DRE & PSA Men over 50 Annually.  DRE might require a copay.     Sexually Transmitted Diseases  Syphilis All at risk adults Annually for men at increased risk

## 2019-10-16 ENCOUNTER — Telehealth: Payer: Self-pay | Admitting: Oncology

## 2019-10-16 ENCOUNTER — Inpatient Hospital Stay: Payer: Medicare Other | Attending: Oncology | Admitting: Oncology

## 2019-10-16 ENCOUNTER — Inpatient Hospital Stay: Payer: Medicare Other

## 2019-10-16 ENCOUNTER — Ambulatory Visit (INDEPENDENT_AMBULATORY_CARE_PROVIDER_SITE_OTHER): Payer: Medicare Other | Admitting: Pharmacist

## 2019-10-16 ENCOUNTER — Other Ambulatory Visit: Payer: Self-pay

## 2019-10-16 VITALS — BP 169/83 | HR 90 | Temp 98.5°F | Resp 18 | Ht 64.0 in | Wt 185.2 lb

## 2019-10-16 DIAGNOSIS — T451X5A Adverse effect of antineoplastic and immunosuppressive drugs, initial encounter: Secondary | ICD-10-CM | POA: Insufficient documentation

## 2019-10-16 DIAGNOSIS — Z86718 Personal history of other venous thrombosis and embolism: Secondary | ICD-10-CM | POA: Diagnosis not present

## 2019-10-16 DIAGNOSIS — C679 Malignant neoplasm of bladder, unspecified: Secondary | ICD-10-CM

## 2019-10-16 DIAGNOSIS — Z5181 Encounter for therapeutic drug level monitoring: Secondary | ICD-10-CM | POA: Diagnosis not present

## 2019-10-16 DIAGNOSIS — D701 Agranulocytosis secondary to cancer chemotherapy: Secondary | ICD-10-CM | POA: Diagnosis not present

## 2019-10-16 DIAGNOSIS — Z7901 Long term (current) use of anticoagulants: Secondary | ICD-10-CM

## 2019-10-16 DIAGNOSIS — Z79899 Other long term (current) drug therapy: Secondary | ICD-10-CM | POA: Diagnosis not present

## 2019-10-16 DIAGNOSIS — D6869 Other thrombophilia: Secondary | ICD-10-CM | POA: Diagnosis not present

## 2019-10-16 DIAGNOSIS — Z5111 Encounter for antineoplastic chemotherapy: Secondary | ICD-10-CM | POA: Insufficient documentation

## 2019-10-16 LAB — CMP (CANCER CENTER ONLY)
ALT: 16 U/L (ref 0–44)
AST: 22 U/L (ref 15–41)
Albumin: 3.8 g/dL (ref 3.5–5.0)
Alkaline Phosphatase: 58 U/L (ref 38–126)
Anion gap: 9 (ref 5–15)
BUN: 14 mg/dL (ref 8–23)
CO2: 26 mmol/L (ref 22–32)
Calcium: 8.4 mg/dL — ABNORMAL LOW (ref 8.9–10.3)
Chloride: 106 mmol/L (ref 98–111)
Creatinine: 1.02 mg/dL — ABNORMAL HIGH (ref 0.44–1.00)
GFR, Est AFR Am: >60 mL/min (ref >60)
GFR, Estimated: 54 mL/min — ABNORMAL LOW (ref >60)
Glucose, Bld: 114 mg/dL — ABNORMAL HIGH (ref 70–99)
Potassium: 4.6 mmol/L (ref 3.5–5.1)
Sodium: 141 mmol/L (ref 135–145)
Total Bilirubin: 0.4 mg/dL (ref 0.3–1.2)
Total Protein: 7 g/dL (ref 6.5–8.1)

## 2019-10-16 LAB — CBC WITH DIFFERENTIAL (CANCER CENTER ONLY)
Abs Immature Granulocytes: 0.04 10*3/uL (ref 0.00–0.07)
Basophils Absolute: 0.1 10*3/uL (ref 0.0–0.1)
Basophils Relative: 2 %
Eosinophils Absolute: 0.2 10*3/uL (ref 0.0–0.5)
Eosinophils Relative: 5 %
HCT: 34.6 % — ABNORMAL LOW (ref 36.0–46.0)
Hemoglobin: 11.2 g/dL — ABNORMAL LOW (ref 12.0–15.0)
Immature Granulocytes: 1 %
Lymphocytes Relative: 41 %
Lymphs Abs: 1.4 10*3/uL (ref 0.7–4.0)
MCH: 32.2 pg (ref 26.0–34.0)
MCHC: 32.4 g/dL (ref 30.0–36.0)
MCV: 99.4 fL (ref 80.0–100.0)
Monocytes Absolute: 0.7 10*3/uL (ref 0.1–1.0)
Monocytes Relative: 21 %
Neutro Abs: 1 10*3/uL — ABNORMAL LOW (ref 1.7–7.7)
Neutrophils Relative %: 30 %
Platelet Count: 409 10*3/uL — ABNORMAL HIGH (ref 150–400)
RBC: 3.48 MIL/uL — ABNORMAL LOW (ref 3.87–5.11)
RDW: 12.1 % (ref 11.5–15.5)
WBC Count: 3.3 10*3/uL — ABNORMAL LOW (ref 4.0–10.5)
nRBC: 0 % (ref 0.0–0.2)

## 2019-10-16 LAB — POCT INR: INR: 3.1 — AB (ref 2.0–3.0)

## 2019-10-16 NOTE — Progress Notes (Signed)
Anticoagulation Management Pamela [name] is a 74 y.o. female who reports to the clinic for monitoring of warfarin treatment.    Indication: Secondary hypercoagulable state; History of VTE; Long term current use of anticoagulant.    Duration: indefinite Supervising physician: Lenice Pressman, MD, PhD  Anticoagulation Clinic Visit History: Patient does not report signs/symptoms of bleeding or thromboembolism  Other recent changes: No diet, medications, lifestyle changes except as observed from the patient's problem list.  Anticoagulation Episode Summary    Current INR goal:  2.0-3.0  TTR:  69.0 % (9.1 y)  Next INR check:  11/06/2019  INR from last check:  3.1 (10/16/2019)  Weekly max warfarin dose:    Target end date:  Indefinite  INR check location:  Anticoagulation Clinic  Preferred lab:    Send INR reminders to:  ANTICOAG [facility]   Indications   Secondary hypercoagulable state ([place]) [D68.69] DVT HX OF (Resolved) [id number]       Comments:        Anticoagulation Care Providers    Provider Role Specialty Phone number   Burman Freestone, MD  Internal Medicine [phone]      Allergies  Allergen Reactions  . Ace Inhibitors Cough    Current Outpatient Medications:  .  Ascorbic Acid (VITAMIN C PO), Take 1 tablet by mouth every evening., Disp: , Rfl:  .  CALCIUM PO, Take 1 tablet by mouth every evening., Disp: , Rfl:  .  colestipol (COLESTID) 1 g tablet, TAKE 10 TABLETS BY MOUTH EVERY DAY (Patient taking differently: Take 6 g by mouth daily at 12 noon. ), Disp: 900 tablet, Rfl: 3 .  cyanocobalamin 1000 MCG tablet, Take 1,000 mcg by mouth daily., Disp: , Rfl:  .  HYDROcodone-acetaminophen (NORCO) 5-325 MG tablet, Take 1 tablet by mouth every 4 (four) hours as needed for moderate pain., Disp: 6 tablet, Rfl: 0 .  levothyroxine (SYNTHROID) 88 MCG tablet, TAKE ONE (1) TABLET BY MOUTH EVERY DAY (Patient taking differently: Take 88 mcg by mouth daily before breakfast. ), Disp:  90 tablet, Rfl: 3 .  lidocaine-prilocaine (EMLA) cream, Apply 1 application topically as needed., Disp: 30 g, Rfl: 0 .  losartan (COZAAR) 25 MG tablet, TAKE ONE (1) TABLET BY MOUTH EVERY DAY (Patient taking differently: Take 25 mg by mouth daily. ), Disp: 90 tablet, Rfl: 3 .  Multiple Vitamin (MULTIVITAMIN WITH MINERALS) TABS tablet, Take 1 tablet by mouth daily in the afternoon., Disp: , Rfl:  .  pantoprazole (PROTONIX) 40 MG tablet, TAKE 1 TABLET BY MOUTH ONCE DAILY, Disp: 90 tablet, Rfl: 3 .  prochlorperazine (COMPAZINE) 10 MG tablet, Take 1 tablet (10 mg total) by mouth every 6 (six) hours as needed for nausea or vomiting., Disp: 30 tablet, Rfl: 0 .  VITAMIN D PO, Take 1 capsule by mouth every evening., Disp: , Rfl:  .  warfarin (COUMADIN) 4 MG tablet, TAKE ONE TABLET BY MOUTH ON MONDAYS AND FRIDAYS AND TAKE ONE-HALF TABLET ALL OTHER DAYS (Patient taking differently: Take 2 mg by mouth See admin instructions. Take 2 mg daily and 4mg  on Monday), Disp: 65 tablet, Rfl: 11 Past Medical History:  Diagnosis Date  . bladder ca dx'd 09/05/2019  . CHOLELITHIASIS, WITH OBSTRUCTION 04/21/2006   s/p ERCP,sprincterotomy, stent ([place])  . Complication of anesthesia   . 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 GLAUCOMA 04/24/2008  . HYPERLIPIDEMIA 06/03/2006  . Hypertension   . Hypothyroidism lifelong  . OBESITY, MILD 04/24/2008  .  PONV (postoperative nausea and vomiting)   . Tenosynovitis 01/2004   [place]   Social History   Socioeconomic History  . Marital status: Widowed    Spouse name: Not on file  . Number of children: Not on file  . Years of education: Not on file  . Highest education level: Not on file  Occupational History  . 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 Smoking status: Former Smoker    Packs/day: 1.00    Years: 20.00    Pack years: 20.00    Quit date: 07/06/1985     Years since quitting: 34.3  . Smokeless tobacco: Former Systems developer  . Tobacco comment: Quit 1988  Substance and Sexual Activity  . Alcohol use: Yes    Comment: 2-3 glasses of wine daily.  . Drug use: No  . Sexual activity: Not on file  Other Topics Concern  . Not on file  Social History Narrative   Married, no regular exercise.   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 Then worked in [employer] with [employer].    Social Determinants of Health   Financial Resource Strain:   . Difficulty of Paying Living Expenses:   Food Insecurity:   . Worried About Charity fundraiser in the Last Year:   . Arboriculturist in the Last Year:   Transportation Needs:   . Film/video editor (Medical):   [name] Lack of Transportation (Non-Medical):   Physical Activity:   . Days of Exercise per Week:   . Minutes of Exercise per Session:   Stress:   . Feeling of Stress :   Social Connections:   . Frequency of Communication with Friends and Family:   . Frequency of Social Gatherings with Friends and Family:   . Attends Religious Services:   . Active Member of Clubs or Organizations:   . Attends Archivist Meetings:   [name] Marital Status:    Family History  Problem Relation Age of Onset  . Cancer Mother        H&N, smoker  . Cancer Sister        lung, 2011, smoker  . Other Other        grandmother had mastectomy in her 62's unknown reason  . Breast cancer Neg Hx     ASSESSMENT Recent Results: The most recent result is correlated with 18 mg per week: Lab Results  Component Value Date   INR 3.1 (A) 10/16/2019   INR 1.7 (A) 10/02/2019   INR 1.1 09/20/2019    Anticoagulation Dosing: Description   Take one of your 4mg  strength blue warfarin tablets on Wednesdays. All other days, take only one-half (1/2) of your 4mg  strength blue warfarin tablets.     INR today: Supratherapeutic  PLAN Weekly dose was decreased by 11% to 16 mg per  week  Patient Instructions  Patient instructed to take medications as defined in the Anti-coagulation Track section of this encounter.  Patient instructed to take today's dose.  Patient instructed to take  one of your 4mg  strength blue warfarin tablets on Wednesdays. All other days, take only one-half (1/2) of your 4mg  strength blue warfarin tablets. Patient verbalized understanding of these instructions.    Patient advised to contact clinic or seek medical attention if signs/symptoms of bleeding or thromboembolism occur.  Patient verbalized  understanding by repeating back information and was advised to contact me if further medication-related questions arise. Patient was also provided an information handout.  Follow-up Return in 3 weeks (on 11/06/2019) for Follow up INR.  Pennie Banter, PharmD, CPP  15 minutes spent face-to-face with the patient during the encounter. 50% of time spent on education, including signs/sx bleeding and clotting, as well as food and drug interactions with warfarin. 50% of time was spent on fingerprick POC INR sample collection,processing, results determination, and documentation in http://[email or web address].net/.

## 2019-10-16 NOTE — Patient Instructions (Signed)
Patient instructed to take medications as defined in the Anti-coagulation Track section of this encounter.  Patient instructed to take today's dose.  Patient instructed to take  one of your 4mg  strength blue warfarin tablets on Wednesdays. All other days, take only one-half (1/2) of your 4mg  strength blue warfarin tablets. Patient verbalized understanding of these instructions.

## 2019-10-16 NOTE — Progress Notes (Signed)
Pharmacist Chemotherapy Monitoring - Follow Up Assessment    I verify that I have reviewed each item in the below checklist:  . Regimen for the patient is scheduled for the appropriate day and plan matches scheduled date. [name] Appropriate non-routine labs are ordered dependent on drug ordered. . If applicable, additional medications reviewed and ordered per protocol based on lifetime cumulative doses and/or treatment regimen.   Plan for follow-up and/or issues identified: No . I-vent associated with next due treatment: No . MD and/or nursing notified: No  Pamela [name] [facility] 10/16/2019 3:25 PM

## 2019-10-16 NOTE — Progress Notes (Signed)
INTERNAL MEDICINE TEACHING ATTENDING ADDENDUM  I agree with pharmacy recommendations as outlined in their note.   Elois Averitt N Donnetta Gillin, MD  

## 2019-10-16 NOTE — Progress Notes (Signed)
Hematology and Oncology Follow Up Visit  Jalesia Sites [id number] 08-05-1945 74 y.o. 10/16/2019 8:36 AM Bartholomew Crews, MDButcher, Real Cons, MD   Principle Diagnosis: 74 year old with high-grade urothelial carcinoma of the bladder with glandular component diagnosed in February 2021.  She was found to have T2N0 disease.   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  Current therapy: Neoadjuvant chemotherapy with gemcitabine and cisplatin started on March 24th 2021.  She is here for the start of cycle 2.  Interim History: Ms. Neuhaus returns today for a follow-up visit.  Since last visit, she completed the first cycle of chemotherapy without any complications.  She denies any nausea, vomiting or abdominal pain.  She denies any recent hospitalization or illnesses.  She denies any worsening neuropathy or hematuria.  She does report some mild fatigue and tiredness but manageable at this time.     Medications: I have reviewed the patient's current medications.  Current Outpatient Medications  Medication Sig Dispense Refill  . Ascorbic Acid (VITAMIN C PO) Take 1 tablet by mouth every evening.    [name] CALCIUM PO Take 1 tablet by mouth every evening.    . colestipol (COLESTID) 1 g tablet TAKE 10 TABLETS BY MOUTH EVERY DAY (Patient taking differently: Take 6 g by mouth daily at 12 noon. ) 900 tablet 3  . cyanocobalamin 1000 MCG tablet Take 1,000 mcg by mouth daily.    [name] HYDROcodone-acetaminophen (NORCO) 5-325 MG tablet Take 1 tablet by mouth every 4 (four) hours as needed for moderate pain. (Patient not taking: Reported on 09/11/2019) 6 tablet 0  . levothyroxine (SYNTHROID) 88 MCG tablet TAKE ONE (1) TABLET BY MOUTH EVERY DAY (Patient taking differently: Take 88 mcg by mouth daily before breakfast. ) 90 tablet 3  . lidocaine-prilocaine (EMLA) cream Apply 1 application topically as needed. 30 g 0  . losartan  (COZAAR) 25 MG tablet TAKE ONE (1) TABLET BY MOUTH EVERY DAY (Patient taking differently: Take 25 mg by mouth daily. ) 90 tablet 3  . Multiple Vitamin (MULTIVITAMIN WITH MINERALS) TABS tablet Take 1 tablet by mouth daily in the afternoon.    . pantoprazole (PROTONIX) 40 MG tablet TAKE 1 TABLET BY MOUTH ONCE DAILY 90 tablet 3  . prochlorperazine (COMPAZINE) 10 MG tablet Take 1 tablet (10 mg total) by mouth every 6 (six) hours as needed for nausea or vomiting. (Patient not taking: Reported on 10/02/2019) 30 tablet 0  . VITAMIN D PO Take 1 capsule by mouth every evening.    . warfarin (COUMADIN) 4 MG tablet TAKE ONE TABLET BY MOUTH ON MONDAYS AND FRIDAYS AND TAKE ONE-HALF TABLET ALL OTHER DAYS (Patient taking differently: Take 2 mg by mouth See admin instructions. Take 2 mg daily and 4mg  on Monday) 65 tablet 11   No current facility-administered medications for this visit.     Allergies:  Allergies  Allergen Reactions  . Ace Inhibitors Cough      Physical Exam: Blood pressure (!) 169/83, pulse 90, temperature 98.5 F (36.9 C), temperature source Temporal, resp. rate 18, height 5\' 4"  (1.626 m), weight 185 lb 3.2 oz (84 kg), SpO2 98 %.   ECOG: 1   General appearance: Comfortable appearing without any discomfort Head: Normocephalic without any trauma Oropharynx: Mucous membranes are moist and pink without any thrush or ulcers. Eyes: Pupils are equal and round reactive to light. Lymph nodes: No cervical, supraclavicular, inguinal or axillary lymphadenopathy.  Heart:regular rate and rhythm.  S1 and S2 without leg edema. Lung: Clear without any rhonchi or wheezes.  No dullness to percussion. Abdomin: Soft, nontender, nondistended with good bowel sounds.  No hepatosplenomegaly. Musculoskeletal: No joint deformity or effusion.  Full range of motion noted. Neurological: No deficits noted on motor, sensory and deep tendon reflex exam. Skin: No petechial rash or dryness.  Appeared moist.       Lab Results: Lab Results  Component Value Date   WBC 3.7 (L) 10/03/2019   HGB 12.4 10/03/2019   HCT 37.6 10/03/2019   MCV 99.5 10/03/2019   PLT 179 10/03/2019     Chemistry      Component Value Date/Time   NA 138 10/03/2019 0823   NA 143 06/13/2019 1520   K 4.6 10/03/2019 0823   CL 99 10/03/2019 0823   CO2 29 10/03/2019 0823   BUN 14 10/03/2019 0823   BUN 15 06/13/2019 1520   CREATININE 1.09 (H) 10/03/2019 0823   CREATININE 0.78 12/13/2014 0954      Component Value Date/Time   CALCIUM 9.0 10/03/2019 0823   ALKPHOS 64 10/03/2019 0823   AST 69 (H) 10/03/2019 0823   ALT 84 (H) 10/03/2019 0823   BILITOT 0.4 10/03/2019 0823     IMPRESSION: No evidence for metastatic disease in the chest.  Aortic Atherosclerois (ICD10-170.0)    Impression and Plan:   74 year old woman with:  1.    T2N0 high-grade urothelial carcinoma of the bladder  diagnosed in February 2021.    She was found to have mixed histology with glandular as well as small cell component.  She has completed the first cycle of neoadjuvant chemotherapy without complications.  Risks and benefits of continuing chemotherapy was reviewed at this time.  Complications include cytopenias occluding neutropenia, thrombocytopenia and bleeding complications were reviewed.  She is agreeable to continuing the plan is to proceed with 3-4 cycles of therapy depending on her tolerance.    2. IV access: Port-A-Cath remains in place without any issues.  Chemotherapy will continue to be administered via her port.  3. Antiemetics: Compazine is available to her without any recent nausea or vomiting.  4. Renal function surveillance:Creatinine clearance is overall stable and will be monitored on platinum therapy.  5. Goals of care:Her disease remains curable and aggressive measures are warranted at this time.  6.  History of recurrent thrombosis: She is currently on warfarin with INR close to therapeutic  range.  7.  Neutropenia: Related to chemotherapy although not at risk of sepsis.  Will adjust chemotherapy doses for subsequent cycles.  8. Follow-up: She will return on April 13 for the start of cycle 2.  Cycle 3 will start in 3 weeks.  30  minutes were spent on this encounter.  The time was dedicated to reviewing laboratory data, disease status update and addressing complication related therapy.      Zola Button, MD 4/12/20218:36 AM

## 2019-10-16 NOTE — Telephone Encounter (Signed)
Scheduled appt per 4/12 los.  

## 2019-10-17 ENCOUNTER — Other Ambulatory Visit: Payer: Self-pay

## 2019-10-17 ENCOUNTER — Inpatient Hospital Stay: Payer: Medicare Other

## 2019-10-17 VITALS — BP 128/72 | HR 83 | Temp 98.0°F | Resp 18

## 2019-10-17 DIAGNOSIS — C679 Malignant neoplasm of bladder, unspecified: Secondary | ICD-10-CM

## 2019-10-17 DIAGNOSIS — Z5111 Encounter for antineoplastic chemotherapy: Secondary | ICD-10-CM | POA: Diagnosis not present

## 2019-10-17 MED ORDER — SODIUM CHLORIDE 0.9 % IV SOLN
150.0000 mg | Freq: Once | INTRAVENOUS | Status: AC
  Administered 2019-10-17: 150 mg via INTRAVENOUS
  Filled 2019-10-17: qty 150

## 2019-10-17 MED ORDER — SODIUM CHLORIDE 0.9 % IV SOLN
10.0000 mg | Freq: Once | INTRAVENOUS | Status: AC
  Administered 2019-10-17: 10 mg via INTRAVENOUS
  Filled 2019-10-17: qty 10

## 2019-10-17 MED ORDER — PALONOSETRON HCL INJECTION 0.25 MG/5ML
0.2500 mg | Freq: Once | INTRAVENOUS | Status: AC
  Administered 2019-10-17: 0.25 mg via INTRAVENOUS

## 2019-10-17 MED ORDER — SODIUM CHLORIDE 0.9% FLUSH
10.0000 mL | INTRAVENOUS | Status: DC | PRN
  Administered 2019-10-17: 10 mL
  Filled 2019-10-17: qty 10

## 2019-10-17 MED ORDER — SODIUM CHLORIDE 0.9 % IV SOLN
800.0000 mg/m2 | Freq: Once | INTRAVENOUS | Status: AC
  Administered 2019-10-17: 1558 mg via INTRAVENOUS
  Filled 2019-10-17: qty 40.98

## 2019-10-17 MED ORDER — SODIUM CHLORIDE 0.9 % IV SOLN
Freq: Once | INTRAVENOUS | Status: AC
  Filled 2019-10-17: qty 250

## 2019-10-17 MED ORDER — HEPARIN SOD (PORK) LOCK FLUSH 100 UNIT/ML IV SOLN
500.0000 [IU] | Freq: Once | INTRAVENOUS | Status: AC | PRN
  Administered 2019-10-17: 500 [IU]
  Filled 2019-10-17: qty 5

## 2019-10-17 MED ORDER — SODIUM CHLORIDE 0.9 % IV SOLN
51.5000 mg/m2 | Freq: Once | INTRAVENOUS | Status: AC
  Administered 2019-10-17: 100 mg via INTRAVENOUS
  Filled 2019-10-17: qty 100

## 2019-10-17 MED ORDER — PALONOSETRON HCL INJECTION 0.25 MG/5ML
INTRAVENOUS | Status: AC
  Filled 2019-10-17: qty 5

## 2019-10-17 MED ORDER — POTASSIUM CHLORIDE 2 MEQ/ML IV SOLN
Freq: Once | INTRAVENOUS | Status: AC
  Filled 2019-10-17: qty 10

## 2019-10-17 NOTE — Patient Instructions (Signed)
[place] Discharge Instructions for Patients Receiving Chemotherapy  Today you received the following chemotherapy agents: cisplatin and gemcitabine.  To help prevent nausea and vomiting after your treatment, we encourage you to take your nausea medication as directed.   If you develop nausea and vomiting that is not controlled by your nausea medication, call the clinic.   BELOW ARE SYMPTOMS THAT SHOULD BE REPORTED IMMEDIATELY:  *FEVER GREATER THAN 100.5 F  *CHILLS WITH OR WITHOUT FEVER  NAUSEA AND VOMITING THAT IS NOT CONTROLLED WITH YOUR NAUSEA MEDICATION  *UNUSUAL SHORTNESS OF BREATH  *UNUSUAL BRUISING OR BLEEDING  TENDERNESS IN MOUTH AND THROAT WITH OR WITHOUT PRESENCE OF ULCERS  *URINARY PROBLEMS  *BOWEL PROBLEMS  UNUSUAL RASH Items with * indicate a potential emergency and should be followed up as soon as possible.  Feel free to call the clinic should you have any questions or concerns. The clinic phone number is [phone].  Please show the [place] at check-in to the Emergency Department and triage nurse.

## 2019-10-18 ENCOUNTER — Encounter: Payer: Self-pay | Admitting: Internal Medicine

## 2019-10-18 ENCOUNTER — Ambulatory Visit (INDEPENDENT_AMBULATORY_CARE_PROVIDER_SITE_OTHER): Payer: Medicare Other | Admitting: Internal Medicine

## 2019-10-18 ENCOUNTER — Other Ambulatory Visit: Payer: Self-pay

## 2019-10-18 VITALS — Ht 64.0 in | Wt 182.0 lb

## 2019-10-18 DIAGNOSIS — Z0000 Encounter for general adult medical examination without abnormal findings: Secondary | ICD-10-CM

## 2019-10-18 NOTE — Progress Notes (Signed)
Pharmacist Chemotherapy Monitoring - Follow Up Assessment    I verify that I have reviewed each item in the below checklist:  . Regimen for the patient is scheduled for the appropriate day and plan matches scheduled date. [name] Appropriate non-routine labs are ordered dependent on drug ordered. . If applicable, additional medications reviewed and ordered per protocol based on lifetime cumulative doses and/or treatment regimen.   Plan for follow-up and/or issues identified: No . I-vent associated with next due treatment: No . MD and/or nursing notified: No  Pamela [name] D 10/18/2019 9:38 AM

## 2019-10-18 NOTE — Patient Instructions (Addendum)
Annual Wellness Visit   Medicare Covered Preventative Screenings and Services  Services & Screenings Men and Women Who How Often Need? Date of Last Service Action  Abdominal Aortic Aneurysm Adults with AAA risk factors Once     Alcohol Misuse and Counseling All Adults Screening once a year if no alcohol misuse. Counseling up to 4 face to face sessions. Yes    Bone Density Measurement  Adults at risk for osteoporosis Once every 2 yrs     Lipid Panel Z13.6 All adults without CV disease Once every 5 yrs     Colorectal Cancer   Stool sample or  Colonoscopy All adults 64 and older   Once every year  Every 10 years     Depression All Adults Once a year  Today   Diabetes Screening Blood glucose, post glucose load, or GTT Z13.1  All adults at risk  Pre-diabetics  Once per year  Twice per year     Diabetes  Self-Management Training All adults Diabetics 10 hrs first year; 2 hours subsequent years. Requires Copay     Glaucoma  Diabetics  Family history of glaucoma  African Americans 41 yrs +  Hispanic Americans 57 yrs + Annually - requires coppay     Hepatitis C Z72.89 or F19.20  High Risk for HCV  Born between 1945 and 1965  Annually  Once     HIV Z11.4 All adults based on risk  Annually btw ages 86 & 94 regardless of risk  Annually > 65 yrs if at increased risk     Lung Cancer Screening Asymptomatic adults aged 37-77 with 30 pack yr history and current smoker OR quit within the last 15 yrs Annually Must have counseling and shared decision making documentation before first screen     Medical Nutrition Therapy Adults with   Diabetes  Renal disease  Kidney transplant within past 3 yrs 3 hours first year; 2 hours subsequent years     Obesity and Counseling All adults Screening once a year Counseling if BMI 30 or higher  Today   Tobacco Use Counseling Adults who use tobacco  Up to 8 visits in one year     Vaccines Z23  Hepatitis B  Influenza   Pneumonia  Adults     Once  Once every flu season  Two different vaccines separated by one year     Next Annual Wellness Visit People with Medicare Every year  Today     Services & Screenings Women Who How Often Need  Date of Last Service Action  Mammogram  Z12.31 Women over 45 One baseline ages 82-39. Annually ager 40 yrs+     Pap tests All women Annually if high risk. Every 2 yrs for normal risk women     Screening for cervical cancer with   Pap (Z01.419 nl or Z01.411abnl) &  HPV Z11.51 Women aged 21 to 17 Once every 5 yrs     Screening pelvic and breast exams All women Annually if high risk. Every 2 yrs for normal risk women     Sexually Transmitted Diseases  Chlamydia  Gonorrhea  Syphilis All at risk adults Annually for non pregnant females at increased risk         [place] Men Who How Ofter Need  Date of Last Service Action  Prostate Cancer - DRE & PSA Men over 50 Annually.  DRE might require a copay.     Sexually Transmitted Diseases  Syphilis All at risk adults  Annually for men at increased risk         Things That May Be Affecting Your Health: X Alcohol  Hearing loss  Pain    Depression  Home Safety  Sexual Health   Diabetes  Lack of physical activity X Stress (Recent cancer diagnosis)   Difficulty with daily activities  Loneliness  Tiredness   Drug use  Medicines  Tobacco use   Falls  Motor Vehicle Safety  Weight   Food choices  Oral Health  Other    YOUR PERSONALIZED HEALTH PLAN : 1. Schedule your next subsequent Medicare Wellness visit in one year 2. Attend all of your regular appointments to address your medical issues 3. Complete the preventative screenings and services 4. Continue walking 20 minutes daily in good weather and when knee is not too painful 5. Begin seated and standing exercises with exercise band.     Fall Prevention in the Home, Adult Falls can cause injuries. They can happen to people of all ages. There are many things you can  do to make your home safe and to help prevent falls. Ask for help when making these changes, if needed. What actions can I take to prevent falls? General Instructions  Use good lighting in all rooms. Replace any light bulbs that burn out.  Turn on the lights when you go into a dark area. Use night-lights.  Keep items that you use often in easy-to-reach places. Lower the shelves around your home if necessary.  Set up your furniture so you have a clear path. Avoid moving your furniture around.  Do not have throw rugs and other things on the floor that can make you trip.  Avoid walking on wet floors.  If any of your floors are uneven, fix them.  Add color or contrast paint or tape to clearly mark and help you see: ? Any grab bars or handrails. ? First and last steps of stairways. ? Where the edge of each step is.  If you use a stepladder: ? Make sure that it is fully opened. Do not climb a closed stepladder. ? Make sure that both sides of the stepladder are locked into place. ? Ask someone to hold the stepladder for you while you use it.  If there are any pets around you, be aware of where they are. What can I do in the bathroom?      Keep the floor dry. Clean up any water that spills onto the floor as soon as it happens.  Remove soap buildup in the tub or shower regularly.  Use non-skid mats or decals on the floor of the tub or shower.  Attach bath mats securely with double-sided, non-slip rug tape.  If you need to sit down in the shower, use a plastic, non-slip stool.  Install grab bars by the toilet and in the tub and shower. Do not use towel bars as grab bars. What can I do in the bedroom?  Make sure that you have a light by your bed that is easy to reach.  Do not use any sheets or blankets that are too big for your bed. They should not hang down onto the floor.  Have a firm chair that has side arms. You can use this for support while you get dressed. What can I  do in the kitchen?  Clean up any spills right away.  If you need to reach something above you, use a strong step stool that has a grab bar.  Keep electrical cords out of the way.  Do not use floor polish or wax that makes floors slippery. If you must use wax, use non-skid floor wax. What can I do with my stairs?  Do not leave any items on the stairs.  Make sure that you have a light switch at the top of the stairs and the bottom of the stairs. If you do not have them, ask someone to add them for you.  Make sure that there are handrails on both sides of the stairs, and use them. Fix handrails that are broken or loose. Make sure that handrails are as long as the stairways.  Install non-slip stair treads on all stairs in your home.  Avoid having throw rugs at the top or bottom of the stairs. If you do have throw rugs, attach them to the floor with carpet tape.  Choose a carpet that does not hide the edge of the steps on the stairway.  Check any carpeting to make sure that it is firmly attached to the stairs. Fix any carpet that is loose or worn. What can I do on the outside of my home?  Use bright outdoor lighting.  Regularly fix the edges of walkways and driveways and fix any cracks.  Remove anything that might make you trip as you walk through a door, such as a raised step or threshold.  Trim any bushes or trees on the path to your home.  Regularly check to see if handrails are loose or broken. Make sure that both sides of any steps have handrails.  Install guardrails along the edges of any raised decks and porches.  Clear walking paths of anything that might make someone trip, such as tools or rocks.  Have any leaves, snow, or ice cleared regularly.  Use sand or salt on walking paths during winter.  Clean up any spills in your garage right away. This includes grease or oil spills. What other actions can I take?  Wear shoes that: ? Have a low heel. Do not wear high  heels. ? Have rubber bottoms. ? Are comfortable and fit you well. ? Are closed at the toe. Do not wear open-toe sandals.  Use tools that help you move around (mobility aids) if they are needed. These include: ? Canes. ? Walkers. ? Scooters. ? Crutches.  Review your medicines with your doctor. Some medicines can make you feel dizzy. This can increase your chance of falling. Ask your doctor what other things you can do to help prevent falls. Where to find more information  Centers for Disease Control and Prevention, STEADI: https://garcia.biz/  [employer] on Aging: BrainJudge.co.uk Contact a doctor if:  You are afraid of falling at home.  You feel weak, drowsy, or dizzy at home.  You fall at home. Summary  There are many simple things that you can do to make your home safe and to help prevent falls.  Ways to make your home safe include removing tripping hazards and installing grab bars in the bathroom.  Ask for help when making these changes in your home. This information is not intended to replace advice given to you by your health care provider. Make sure you discuss any questions you have with your health care provider. Document Revised: 10/13/2018 Document Reviewed: 02/04/2017 Elsevier Patient Education  2020 [place] Maintenance, Female Adopting a healthy lifestyle and getting preventive care are important in promoting health and wellness. Ask your health care provider about:  The  right schedule for you to have regular tests and exams.  Things you can do on your own to prevent diseases and keep yourself healthy. What should I know about diet, weight, and exercise? Eat a healthy diet   Eat a diet that includes plenty of vegetables, fruits, low-fat dairy products, and lean protein.  Do not eat a lot of foods that are high in solid fats, added sugars, or sodium. Maintain a healthy weight Body mass index (BMI) is used to identify weight  problems. It estimates body fat based on height and weight. Your health care provider can help determine your BMI and help you achieve or maintain a healthy weight. Get regular exercise Get regular exercise. This is one of the most important things you can do for your health. Most adults should:  Exercise for at least 150 minutes each week. The exercise should increase your heart rate and make you sweat (moderate-intensity exercise).  Do strengthening exercises at least twice a week. This is in addition to the moderate-intensity exercise.  Spend less time sitting. Even light physical activity can be beneficial. Watch cholesterol and blood lipids Have your blood tested for lipids and cholesterol at 76 years of age, then have this test every 5 years. Have your cholesterol levels checked more often if:  Your lipid or cholesterol levels are high.  You are older than 76 years of age.  You are at high risk for heart disease. What should I know about cancer screening? Depending on your health history and family history, you may need to have cancer screening at various ages. This may include screening for:  Breast cancer.  Cervical cancer.  Colorectal cancer.  Skin cancer.  Lung cancer. What should I know about heart disease, diabetes, and high blood pressure? Blood pressure and heart disease  High blood pressure causes heart disease and increases the risk of stroke. This is more likely to develop in people who have high blood pressure readings, are of African descent, or are overweight.  Have your blood pressure checked: ? Every 3-5 years if you are 94-69 years of age. ? Every year if you are 70 years old or older. Diabetes Have regular diabetes screenings. This checks your fasting blood sugar level. Have the screening done:  Once every three years after age 60 if you are at a normal weight and have a low risk for diabetes.  More often and at a younger age if you are overweight or  have a high risk for diabetes. What should I know about preventing infection? Hepatitis B If you have a higher risk for hepatitis B, you should be screened for this virus. Talk with your health care provider to find out if you are at risk for hepatitis B infection. Hepatitis C Testing is recommended for:  Everyone born from [birth year] through 1965.  Anyone with known risk factors for hepatitis C. Sexually transmitted infections (STIs)  Get screened for STIs, including gonorrhea and chlamydia, if: ? You are sexually active and are younger than 76 years of age. ? You are older than 76 years of age and your health care provider tells you that you are at risk for this type of infection. ? Your sexual activity has changed since you were last screened, and you are at increased risk for chlamydia or gonorrhea. Ask your health care provider if you are at risk.  Ask your health care provider about whether you are at high risk for HIV. Your health care provider may recommend  a prescription medicine to help prevent HIV infection. If you choose to take medicine to prevent HIV, you should first get tested for HIV. You should then be tested every 3 months for as long as you are taking the medicine. Pregnancy  If you are about to stop having your period (premenopausal) and you may become pregnant, seek counseling before you get pregnant.  Take 400 to 800 micrograms (mcg) of folic acid every day if you become pregnant.  Ask for birth control (contraception) if you want to prevent pregnancy. Osteoporosis and menopause Osteoporosis is a disease in which the bones lose minerals and strength with aging. This can result in bone fractures. If you are 21 years old or older, or if you are at risk for osteoporosis and fractures, ask your health care provider if you should:  Be screened for bone loss.  Take a calcium or vitamin D supplement to lower your risk of fractures.  Be given hormone replacement therapy (HRT)  to treat symptoms of menopause. Follow these instructions at home: Lifestyle  Do not use any products that contain nicotine or tobacco, such as cigarettes, e-cigarettes, and chewing tobacco. If you need help quitting, ask your health care provider.  Do not use street drugs.  Do not share needles.  Ask your health care provider for help if you need support or information about quitting drugs. Alcohol use  Do not drink alcohol if: ? Your health care provider tells you not to drink. ? You are pregnant, may be pregnant, or are planning to become pregnant.  If you drink alcohol: ? Limit how much you use to 0-1 drink a day. ? Limit intake if you are breastfeeding.  Be aware of how much alcohol is in your drink. In the [place] one drink equals one 12 oz bottle of beer (355 mL), one 5 oz glass of wine (148 mL), or one 1 oz glass of hard liquor (44 mL). General instructions  Schedule regular health, dental, and eye exams.  Stay current with your vaccines.  Tell your health care provider if: ? You often feel depressed. ? You have ever been abused or do not feel safe at home. Summary  Adopting a healthy lifestyle and getting preventive care are important in promoting health and wellness.  Follow your health care provider's instructions about healthy diet, exercising, and getting tested or screened for diseases.  Follow your health care provider's instructions on monitoring your cholesterol and blood pressure. This information is not intended to replace advice given to you by your health care provider. Make sure you discuss any questions you have with your health care provider. Document Revised: 06/15/2018 Document Reviewed: 06/15/2018 Elsevier Patient Education  2020 [employer].

## 2019-10-18 NOTE — Progress Notes (Signed)
This AWV is being conducted by [facility] only. The patient was located at home and I was located in [facility]. The patient's identity was confirmed using their DOB and current address. The patient or his/her legal guardian has consented to being evaluated through a telephone encounter and understands the associated risks (an examination cannot be done and the patient may need to come in for an appointment) / benefits (allows the patient to remain at home, decreasing exposure to coronavirus). I personally spent 40 minutes conducting the AWV.  Subjective:   Pamela [name] is a 74 y.o. female who presents for a Medicare Annual Wellness Visit.  The following items have been reviewed and updated today in the appropriate area in the EMR.   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 Then worked in [employer] with [employer] [place].       Current Social History 10/18/2019        Patient lives alone in a 2 level home where laundry is in the basement. There is one step without handrail up to the entrance patient uses.      Patient's method of transportation is personal car.      The highest level of education was advanced degree; Master's in Education.      The patient currently retired.      Identified important Relationships are My daughter, good friend, Manuela Schwartz who is starting with dementia. Several women from church and [place].      Pets : None       Interests / Fun: Read a lot, take walks, [employer] with church  groups and [place] groups.       Current Stressors: "Cancer. And unable to get knee replacement last year 2/2 Covid pandemic and this year 2/2 Cancer. Hair is starting to fall out 2/2 chemo"      Religious / Personal Beliefs: "Episcopalian, Christian. I believe in God and [place]."       L. Adrien Shankar, BSN, RN-BC            Objective:    Vitals: Ht 5\' 4"  (1.626 m)   Wt 182 lb (82.6 kg)   BMI 31.24 kg/m  Vitals are unable to obtained due to [id number] public health emergency  Activities of Daily Living In your present state of health, do you have any difficulty performing the following activities: 10/18/2019 08/23/2019  Hearing? Y N  Comment right ear -  Vision? N N  Difficulty concentrating or making decisions? N N  Walking or climbing stairs? Y Y  Comment "Bad knee" -  Dressing or bathing? N N  Doing errands, shopping? N N  Some recent data might be hidden    Goals Goals    . Blood Pressure < 140/90    . Walk daily 20 minutes per time (pt-stated)     Patient would like to increase this to 40 minutes daily once she has right knee replaced       Fall Risk Fall Risk  10/18/2019 06/13/2019 10/05/2017 02/11/2017 12/12/2015  Falls in the past year?  1 - No No No  Number falls in past yr: 1 1 - - -  Comment 2 - - - -  Injury with Fall? 0 0 - - -  Risk for fall due to : History of fall(s) Impaired balance/gait;History of fall(s) - - -  Risk for fall due to: Comment Fell taking laundry down steps both times - - - -  Follow up Education provided;Falls prevention discussed Falls prevention discussed - - -   CDC Handout on Fall Prevention and Handout on Home Exercise Program, Access codes [id number] and [id number] given/mailed to patient with exercise band.   Depression Screen PHQ 2/9 Scores 10/18/2019 06/13/2019 03/02/2019 02/24/2018  PHQ - 2 Score 1 0 1 2  PHQ- 9 Score 5 - 2 7   Made aware of [facility] especially in light of recent cancer diagnosis   Cognitive Testing Six-Item  Cognitive Screener   "I would like to ask you some questions that ask you to use your memory. I am going to name three objects. Please wait until I say all three words, then repeat them. Remember what they are  because I am going to ask you to name them again in a few minutes. Please repeat these words for me: APPLE--TABLE--PENNY." (Interviewer may repeat names 3 times if necessary but repetition not scored.)  Did patient correctly repeat all three words? Yes - may proceed with screen  What year is this? Correct What month is this? Correct What day of the week is this? Correct  What were the three objects I asked you to remember? . Apple Correct . Table Correct . Penny Correct  Score one point for each incorrect answer.  A score of 2 or more points warrants additional investigation.  Patient's score 0    Assessment and Plan:     Alcohol use Screen = 9 OUD screen = Patient has bottle of hydrocodone on hand in case she needs it but has not needed it yet  Patient will continue walking 20 minutes daily in good weather and when right knee is not too painful She will begin seated and standing exercises with exercise band.  During the course of the visit the patient was educated and counseled about appropriate screening and preventive services as documented in the assessment and plan.  The printed AVS was given to the patient and included an updated screening schedule, a list of risk factors, and personalized health advice.        Velora Heckler, RN  10/18/2019

## 2019-10-18 NOTE — Progress Notes (Signed)
I discussed the AWV findings with the RN who conducted the visit. I was present in the office suite and immediately available to provide assistance and direction throughout the time the service was provided.   

## 2019-10-19 ENCOUNTER — Encounter: Payer: Self-pay | Admitting: Oncology

## 2019-10-20 NOTE — Progress Notes (Signed)
Internal Medicine Clinic Attending  Case discussed with Dr. Ronnald Ramp.  We reviewed the AWV findings.  I agree with the assessment, diagnosis, and plan of care documented in the AWV note.

## 2019-10-24 ENCOUNTER — Inpatient Hospital Stay: Payer: Medicare Other

## 2019-10-24 ENCOUNTER — Other Ambulatory Visit: Payer: Self-pay

## 2019-10-24 VITALS — BP 160/90 | HR 77 | Temp 98.3°F | Resp 16

## 2019-10-24 DIAGNOSIS — Z5111 Encounter for antineoplastic chemotherapy: Secondary | ICD-10-CM | POA: Diagnosis not present

## 2019-10-24 DIAGNOSIS — C679 Malignant neoplasm of bladder, unspecified: Secondary | ICD-10-CM

## 2019-10-24 DIAGNOSIS — Z95828 Presence of other vascular implants and grafts: Secondary | ICD-10-CM

## 2019-10-24 LAB — CBC WITH DIFFERENTIAL (CANCER CENTER ONLY)
Abs Immature Granulocytes: 0.18 10*3/uL — ABNORMAL HIGH (ref 0.00–0.07)
Basophils Absolute: 0.1 10*3/uL (ref 0.0–0.1)
Basophils Relative: 2 %
Eosinophils Absolute: 0 10*3/uL (ref 0.0–0.5)
Eosinophils Relative: 0 %
HCT: 32.2 % — ABNORMAL LOW (ref 36.0–46.0)
Hemoglobin: 10.8 g/dL — ABNORMAL LOW (ref 12.0–15.0)
Immature Granulocytes: 5 %
Lymphocytes Relative: 42 %
Lymphs Abs: 1.5 10*3/uL (ref 0.7–4.0)
MCH: 32.8 pg (ref 26.0–34.0)
MCHC: 33.5 g/dL (ref 30.0–36.0)
MCV: 97.9 fL (ref 80.0–100.0)
Monocytes Absolute: 0.3 10*3/uL (ref 0.1–1.0)
Monocytes Relative: 9 %
Neutro Abs: 1.5 10*3/uL — ABNORMAL LOW (ref 1.7–7.7)
Neutrophils Relative %: 42 %
Platelet Count: 400 10*3/uL (ref 150–400)
RBC: 3.29 MIL/uL — ABNORMAL LOW (ref 3.87–5.11)
RDW: 12.5 % (ref 11.5–15.5)
WBC Count: 3.6 10*3/uL — ABNORMAL LOW (ref 4.0–10.5)
nRBC: 0 % (ref 0.0–0.2)

## 2019-10-24 LAB — CMP (CANCER CENTER ONLY)
ALT: 37 U/L (ref 0–44)
AST: 39 U/L (ref 15–41)
Albumin: 3.6 g/dL (ref 3.5–5.0)
Alkaline Phosphatase: 58 U/L (ref 38–126)
Anion gap: 13 (ref 5–15)
BUN: 21 mg/dL (ref 8–23)
CO2: 28 mmol/L (ref 22–32)
Calcium: 8 mg/dL — ABNORMAL LOW (ref 8.9–10.3)
Chloride: 97 mmol/L — ABNORMAL LOW (ref 98–111)
Creatinine: 1.45 mg/dL — ABNORMAL HIGH (ref 0.44–1.00)
GFR, Est AFR Am: 41 mL/min — ABNORMAL LOW (ref >60)
GFR, Estimated: 35 mL/min — ABNORMAL LOW (ref >60)
Glucose, Bld: 112 mg/dL — ABNORMAL HIGH (ref 70–99)
Potassium: 4.6 mmol/L (ref 3.5–5.1)
Sodium: 138 mmol/L (ref 135–145)
Total Bilirubin: 0.4 mg/dL (ref 0.3–1.2)
Total Protein: 6.7 g/dL (ref 6.5–8.1)

## 2019-10-24 MED ORDER — HEPARIN SOD (PORK) LOCK FLUSH 100 UNIT/ML IV SOLN
500.0000 [IU] | Freq: Once | INTRAVENOUS | Status: AC | PRN
  Administered 2019-10-24: 500 [IU]
  Filled 2019-10-24: qty 5

## 2019-10-24 MED ORDER — SODIUM CHLORIDE 0.9 % IV SOLN
Freq: Once | INTRAVENOUS | Status: AC
  Filled 2019-10-24: qty 250

## 2019-10-24 MED ORDER — PROCHLORPERAZINE MALEATE 10 MG PO TABS
ORAL_TABLET | ORAL | Status: AC
  Filled 2019-10-24: qty 1

## 2019-10-24 MED ORDER — SODIUM CHLORIDE 0.9 % IV SOLN
800.0000 mg/m2 | Freq: Once | INTRAVENOUS | Status: AC
  Administered 2019-10-24: 09:00:00 1558 mg via INTRAVENOUS
  Filled 2019-10-24: qty 40.98

## 2019-10-24 MED ORDER — SODIUM CHLORIDE 0.9% FLUSH
10.0000 mL | INTRAVENOUS | Status: DC | PRN
  Administered 2019-10-24: 10 mL
  Filled 2019-10-24: qty 10

## 2019-10-24 MED ORDER — PROCHLORPERAZINE MALEATE 10 MG PO TABS
10.0000 mg | ORAL_TABLET | Freq: Once | ORAL | Status: AC
  Administered 2019-10-24: 09:00:00 10 mg via ORAL

## 2019-10-24 MED ORDER — SODIUM CHLORIDE 0.9% FLUSH
10.0000 mL | INTRAVENOUS | Status: DC | PRN
  Administered 2019-10-24: 10 mL via INTRAVENOUS
  Filled 2019-10-24: qty 10

## 2019-10-24 NOTE — Patient Instructions (Signed)

## 2019-10-24 NOTE — Patient Instructions (Signed)
Cancer Center Discharge Instructions for Patients Receiving Chemotherapy  Today you received the following chemotherapy agents: gemcitabine.  To help prevent nausea and vomiting after your treatment, we encourage you to take your nausea medication as directed.   If you develop nausea and vomiting that is not controlled by your nausea medication, call the clinic.   BELOW ARE SYMPTOMS THAT SHOULD BE REPORTED IMMEDIATELY:  *FEVER GREATER THAN 100.5 F  *CHILLS WITH OR WITHOUT FEVER  NAUSEA AND VOMITING THAT IS NOT CONTROLLED WITH YOUR NAUSEA MEDICATION  *UNUSUAL SHORTNESS OF BREATH  *UNUSUAL BRUISING OR BLEEDING  TENDERNESS IN MOUTH AND THROAT WITH OR WITHOUT PRESENCE OF ULCERS  *URINARY PROBLEMS  *BOWEL PROBLEMS  UNUSUAL RASH Items with * indicate a potential emergency and should be followed up as soon as possible.  Feel free to call the clinic should you have any questions or concerns. The clinic phone number is [phone].  Please show the CHEMO ALERT CARD at check-in to the Emergency Department and triage nurse.   

## 2019-11-01 NOTE — Progress Notes (Signed)

## 2019-11-06 ENCOUNTER — Inpatient Hospital Stay: Payer: Medicare Other | Attending: Oncology | Admitting: Oncology

## 2019-11-06 ENCOUNTER — Inpatient Hospital Stay: Payer: Medicare Other

## 2019-11-06 ENCOUNTER — Encounter: Payer: Self-pay | Admitting: Oncology

## 2019-11-06 ENCOUNTER — Other Ambulatory Visit: Payer: Self-pay

## 2019-11-06 ENCOUNTER — Ambulatory Visit (INDEPENDENT_AMBULATORY_CARE_PROVIDER_SITE_OTHER): Payer: Medicare Other

## 2019-11-06 VITALS — BP 152/90 | HR 95 | Temp 98.0°F | Resp 18 | Ht 64.0 in | Wt 184.9 lb

## 2019-11-06 DIAGNOSIS — Z79899 Other long term (current) drug therapy: Secondary | ICD-10-CM | POA: Diagnosis not present

## 2019-11-06 DIAGNOSIS — T451X5A Adverse effect of antineoplastic and immunosuppressive drugs, initial encounter: Secondary | ICD-10-CM | POA: Insufficient documentation

## 2019-11-06 DIAGNOSIS — Z86718 Personal history of other venous thrombosis and embolism: Secondary | ICD-10-CM | POA: Diagnosis not present

## 2019-11-06 DIAGNOSIS — Z5111 Encounter for antineoplastic chemotherapy: Secondary | ICD-10-CM | POA: Insufficient documentation

## 2019-11-06 DIAGNOSIS — D6869 Other thrombophilia: Secondary | ICD-10-CM

## 2019-11-06 DIAGNOSIS — Z7901 Long term (current) use of anticoagulants: Secondary | ICD-10-CM | POA: Insufficient documentation

## 2019-11-06 DIAGNOSIS — C679 Malignant neoplasm of bladder, unspecified: Secondary | ICD-10-CM

## 2019-11-06 DIAGNOSIS — D709 Neutropenia, unspecified: Secondary | ICD-10-CM | POA: Insufficient documentation

## 2019-11-06 DIAGNOSIS — D6481 Anemia due to antineoplastic chemotherapy: Secondary | ICD-10-CM | POA: Insufficient documentation

## 2019-11-06 LAB — CBC WITH DIFFERENTIAL (CANCER CENTER ONLY)
Abs Immature Granulocytes: 0.11 10*3/uL — ABNORMAL HIGH (ref 0.00–0.07)
Basophils Absolute: 0 10*3/uL (ref 0.0–0.1)
Basophils Relative: 1 %
Eosinophils Absolute: 0.1 10*3/uL (ref 0.0–0.5)
Eosinophils Relative: 2 %
HCT: 31.8 % — ABNORMAL LOW (ref 36.0–46.0)
Hemoglobin: 10.3 g/dL — ABNORMAL LOW (ref 12.0–15.0)
Immature Granulocytes: 2 %
Lymphocytes Relative: 29 %
Lymphs Abs: 1.5 10*3/uL (ref 0.7–4.0)
MCH: 32.2 pg (ref 26.0–34.0)
MCHC: 32.4 g/dL (ref 30.0–36.0)
MCV: 99.4 fL (ref 80.0–100.0)
Monocytes Absolute: 1.2 10*3/uL — ABNORMAL HIGH (ref 0.1–1.0)
Monocytes Relative: 24 %
Neutro Abs: 2.2 10*3/uL (ref 1.7–7.7)
Neutrophils Relative %: 42 %
Platelet Count: 225 10*3/uL (ref 150–400)
RBC: 3.2 MIL/uL — ABNORMAL LOW (ref 3.87–5.11)
RDW: 13.8 % (ref 11.5–15.5)
WBC Count: 5.1 10*3/uL (ref 4.0–10.5)
nRBC: 0 % (ref 0.0–0.2)

## 2019-11-06 LAB — CMP (CANCER CENTER ONLY)
ALT: 16 U/L (ref 0–44)
AST: 24 U/L (ref 15–41)
Albumin: 3.8 g/dL (ref 3.5–5.0)
Alkaline Phosphatase: 52 U/L (ref 38–126)
Anion gap: 11 (ref 5–15)
BUN: 16 mg/dL (ref 8–23)
CO2: 26 mmol/L (ref 22–32)
Calcium: 7.8 mg/dL — ABNORMAL LOW (ref 8.9–10.3)
Chloride: 104 mmol/L (ref 98–111)
Creatinine: 1.11 mg/dL — ABNORMAL HIGH (ref 0.44–1.00)
GFR, Est AFR Am: 56 mL/min — ABNORMAL LOW (ref >60)
GFR, Estimated: 49 mL/min — ABNORMAL LOW (ref >60)
Glucose, Bld: 110 mg/dL — ABNORMAL HIGH (ref 70–99)
Potassium: 4.8 mmol/L (ref 3.5–5.1)
Sodium: 141 mmol/L (ref 135–145)
Total Bilirubin: 0.3 mg/dL (ref 0.3–1.2)
Total Protein: 7.1 g/dL (ref 6.5–8.1)

## 2019-11-06 LAB — POCT INR: INR: 1.4 — AB (ref 2.0–3.0)

## 2019-11-06 NOTE — Patient Instructions (Signed)
Patient instructed to take medications as defined in the Anti-coagulation Track section of this encounter.  Patient instructed to take today's dose.  Patient instructed to take one of the 4mg  strength blue warfarin tablets on Monday and Friday. All other days, take only one-half (1/2) of the 4mg  strength blue warfarin tablets.  Patient verbalized understanding of these instructions.

## 2019-11-06 NOTE — Progress Notes (Signed)
Hematology and Oncology Follow Up Visit  Pamela [name] [id number] [birth year] 74 y.o. 11/06/2019 8:27 AM Pamela [name], MDButcher, Real Cons, MD   Principle Diagnosis: 74 year old with T2N0 high-grade urothelial carcinoma of the bladder with glandular component diagnosed in February 2021.    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  Current therapy: Neoadjuvant chemotherapy with gemcitabine and cisplatin started on March 24th 2021.  She completed 2 cycles of therapy and here to start cycle 3.  Interim History: Pamela [name] presents today for return evaluation.  Since the last visit, she reports no major changes in her health.  She continues to tolerate chemotherapy without any recent complaints.  She denies any nausea, vomiting or abdominal pain.  She denies any worsening neuropathy or excessive fatigue he does report some mild tiredness associated with chemotherapy.     Medications: Unchanged on review. Current Outpatient Medications  Medication Sig Dispense Refill  . Ascorbic Acid (VITAMIN C PO) Take 1 tablet by mouth every evening.    [name] CALCIUM PO Take 1 tablet by mouth every evening.    . colestipol (COLESTID) 1 g tablet TAKE 10 TABLETS BY MOUTH EVERY DAY (Patient taking differently: Take 6 g by mouth daily at 12 noon. ) 900 tablet 3  . cyanocobalamin 1000 MCG tablet Take 1,000 mcg by mouth daily.    [name] HYDROcodone-acetaminophen (NORCO) 5-325 MG tablet Take 1 tablet by mouth every 4 (four) hours as needed for moderate pain. 6 tablet 0  . levothyroxine (SYNTHROID) 88 MCG tablet TAKE ONE (1) TABLET BY MOUTH EVERY DAY (Patient taking differently: Take 88 mcg by mouth daily before breakfast. ) 90 tablet 3  . lidocaine-prilocaine (EMLA) cream Apply 1 application topically as needed. 30 g 0  . losartan (COZAAR) 25 MG tablet TAKE ONE (1) TABLET BY MOUTH EVERY DAY (Patient taking differently: Take  25 mg by mouth daily. ) 90 tablet 3  . Multiple Vitamin (MULTIVITAMIN WITH MINERALS) TABS tablet Take 1 tablet by mouth daily in the afternoon.    . pantoprazole (PROTONIX) 40 MG tablet TAKE 1 TABLET BY MOUTH ONCE DAILY 90 tablet 3  . prochlorperazine (COMPAZINE) 10 MG tablet Take 1 tablet (10 mg total) by mouth every 6 (six) hours as needed for nausea or vomiting. (Patient not taking: Reported on 10/18/2019) 30 tablet 0  . VITAMIN D PO Take 1 capsule by mouth every evening.    . warfarin (COUMADIN) 4 MG tablet TAKE ONE TABLET BY MOUTH ON MONDAYS AND FRIDAYS AND TAKE ONE-HALF TABLET ALL OTHER DAYS (Patient taking differently: Take 2 mg by mouth See admin instructions. Take 2 mg daily and 4mg  on Monday) 65 tablet 11   No current facility-administered medications for this visit.     Allergies:  Allergies  Allergen Reactions  . Ace Inhibitors Cough      Physical Exam: Blood pressure (!) 152/90, pulse 95, temperature 98 F (36.7 C), temperature source Temporal, resp. rate 18, height 5\' 4"  (1.626 m), weight 184 lb 14.4 oz (83.9 kg), SpO2 97 %.   ECOG: 1   General appearance: Alert, awake without any distress. Head: Atraumatic without abnormalities Oropharynx: Without any thrush or ulcers. Eyes: No scleral icterus. Lymph nodes: No lymphadenopathy noted in the cervical, supraclavicular, or axillary nodes Heart:regular rate and rhythm, without any murmurs or gallops.   Lung: Clear to auscultation without any rhonchi, wheezes or dullness to percussion. Abdomin: Soft, nontender without  any shifting dullness or ascites. Musculoskeletal: No clubbing or cyanosis. Neurological: No motor or sensory deficits. Skin: No rashes or lesions.     Lab Results: Lab Results  Component Value Date   WBC 3.6 (L) 10/24/2019   HGB 10.8 (L) 10/24/2019   HCT 32.2 (L) 10/24/2019   MCV 97.9 10/24/2019   PLT 400 10/24/2019     Chemistry      Component Value Date/Time   NA 138 10/24/2019 0815   NA  143 06/13/2019 1520   K 4.6 10/24/2019 0815   CL 97 (L) 10/24/2019 0815   CO2 28 10/24/2019 0815   BUN 21 10/24/2019 0815   BUN 15 06/13/2019 1520   CREATININE 1.45 (H) 10/24/2019 0815   CREATININE 0.78 12/13/2014 0954      Component Value Date/Time   CALCIUM 8.0 (L) 10/24/2019 0815   ALKPHOS 58 10/24/2019 0815   AST 39 10/24/2019 0815   ALT 37 10/24/2019 0815   BILITOT 0.4 10/24/2019 0815        Impression and Plan:   74 year old woman with:  1.    Bladder cancer diagnosed in February 2021.  She was found to have T2N0 high-grade urothelial carcinoma with a glandular component.  The natural course of this disease was updated today.  Risks and benefits of continuing chemotherapy in the neoadjuvant setting were discussed.  Complication associated with this therapy include nausea, vomiting, myelosuppression, neutropenia and possible sepsis.  Plan is to complete 3 cycles of therapy if possible prior to proceeding with radical cystectomy.  Cycle 4 will only be given as her surgery is delayed.    2. IV access: Port-A-Cath continues to be in use without any issues.  3. Antiemetics:  No nausea or vomiting reported at this time.  Compazine is available to her.  4. Renal function surveillance:Creatinine clearance remains relatively stable we will continue to monitor on platinum based therapy.  5. Goals of care:Therapy is curative at this time aggressive measures are warranted given her excellent performance status for the time being.  6.  History of recurrent thrombosis: She is currently on warfarin I will continue to be monitored periodically on chemotherapy.  7.  Neutropenia: Remains manageable at this time without any infections.  Chemotherapy adjustment as resulted and adequate counts.  8. Follow-up: Will be in 3 weeks for evaluation.  She will present in the next 24 hours to start cycle 3 and 8 days for day 8 of cycle 3.  30  minutes were dedicated to the visit.   The time was spent on reviewing laboratory data, discussing disease status, addressing complications noted to therapy and future plan of care discussion.      Pamela Button, MD 5/3/20218:27 AM

## 2019-11-06 NOTE — Progress Notes (Signed)
Anticoagulation Management Analilia [name] is a 74 y.o. female who reports to the clinic for monitoring of warfarin treatment.    Indication: hypercoagulable state 2/2 cancer  Duration: indefinite Supervising physician: [facility] Visit History: Patient does not report signs/symptoms of bleeding or thromboembolism  Other recent changes: no diet, medications, lifestyle changes endorsed   Anticoagulation Episode Summary    Current INR goal:  2.0-3.0  TTR:  69.0 % (9.2 y)  Next INR check:  11/20/2019  INR from last check:  1.4 (11/06/2019)  Weekly max warfarin dose:    Target end date:  Indefinite  INR check location:  Anticoagulation Clinic  Preferred lab:    Send INR reminders to:  [facility]   Indications   Secondary hypercoagulable state ([place]) [D68.69] DVT HX OF (Resolved) [id number]       Comments:        Anticoagulation Care Providers    Provider Role Specialty Phone number   Burman Freestone, MD  Internal Medicine [phone]      Allergies  Allergen Reactions  . Ace Inhibitors Cough    Current Outpatient Medications:  .  Ascorbic Acid (VITAMIN C PO), Take 1 tablet by mouth every evening., Disp: , Rfl:  .  CALCIUM PO, Take 1 tablet by mouth every evening., Disp: , Rfl:  .  colestipol (COLESTID) 1 g tablet, TAKE 10 TABLETS BY MOUTH EVERY DAY (Patient taking differently: Take 6 g by mouth daily at 12 noon. ), Disp: 900 tablet, Rfl: 3 .  cyanocobalamin 1000 MCG tablet, Take 1,000 mcg by mouth daily., Disp: , Rfl:  .  HYDROcodone-acetaminophen (NORCO) 5-325 MG tablet, Take 1 tablet by mouth every 4 (four) hours as needed for moderate pain., Disp: 6 tablet, Rfl: 0 .  levothyroxine (SYNTHROID) 88 MCG tablet, TAKE ONE (1) TABLET BY MOUTH EVERY DAY (Patient taking differently: Take 88 mcg by mouth daily before breakfast. ), Disp: 90 tablet, Rfl: 3 .  lidocaine-prilocaine (EMLA) cream, Apply 1 application topically as needed., Disp: 30  g, Rfl: 0 .  losartan (COZAAR) 25 MG tablet, TAKE ONE (1) TABLET BY MOUTH EVERY DAY (Patient taking differently: Take 25 mg by mouth daily. ), Disp: 90 tablet, Rfl: 3 .  Multiple Vitamin (MULTIVITAMIN WITH MINERALS) TABS tablet, Take 1 tablet by mouth daily in the afternoon., Disp: , Rfl:  .  pantoprazole (PROTONIX) 40 MG tablet, TAKE 1 TABLET BY MOUTH ONCE DAILY, Disp: 90 tablet, Rfl: 3 .  prochlorperazine (COMPAZINE) 10 MG tablet, Take 1 tablet (10 mg total) by mouth every 6 (six) hours as needed for nausea or vomiting., Disp: 30 tablet, Rfl: 0 .  VITAMIN D PO, Take 1 capsule by mouth every evening., Disp: , Rfl:  .  warfarin (COUMADIN) 4 MG tablet, TAKE ONE TABLET BY MOUTH ON MONDAYS AND FRIDAYS AND TAKE ONE-HALF TABLET ALL OTHER DAYS (Patient taking differently: Take 2 mg by mouth See admin instructions. Take 2 mg daily and 4mg  on Monday), Disp: 65 tablet, Rfl: 11 Past Medical History:  Diagnosis Date  . bladder ca dx'd 09/05/2019  . CHOLELITHIASIS, WITH OBSTRUCTION 04/21/2006   s/p ERCP,sprincterotomy, stent ([place])  . Complication of anesthesia   . 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 GLAUCOMA 04/24/2008  . HYPERLIPIDEMIA 06/03/2006  . Hypertension   . Hypothyroidism lifelong  . OBESITY, MILD 04/24/2008  . PONV (postoperative nausea and vomiting)   . Tenosynovitis 01/2004   [place]   Social  History   Socioeconomic History  . Marital status: Widowed    Spouse name: Not on file  . Number of children: Not on file  . Years of education: Not on file  . Highest education level: Not on file  Occupational History  . 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 Smoking status: Former Smoker    Packs/day: 1.00    Years: 20.00    Pack years: 20.00    Quit date: 07/06/1985    Years since quitting: 34.3  . Smokeless tobacco: Former Systems developer  . Tobacco comment: Quit 1988  Substance  and Sexual Activity  . Alcohol use: Yes    Comment: 2-3 glasses of wine daily.  . Drug use: No  . Sexual activity: Not on file  Other Topics Concern  . 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 Then worked in [employer] with [employer].       Current Social History 10/18/2019        Patient lives alone in a 2 level home where laundry is in the basement. There is one step without handrail up to the entrance patient uses.      Patient's method of transportation is personal car.      The highest level of education was advanced degree; Master's in Education.      The patient currently retired.      Identified important Relationships are My daughter, good friend, Pamela [name] who is starting with dementia. Several women from church and [place].      Pets : None       Interests / Fun: Read a lot, take walks, [employer] with church groups and [place] groups.       Current Stressors: "Cancer. And unable to get knee replacement last year 2/2 Covid pandemic and this year 2/2 Cancer. Hair is starting to fall out 2/2 chemo"      Religious / Personal Beliefs: "Episcopalian, Christian. I believe in God and [place]."       L. [place], BSN, RN-BC       Social Determinants of Health   Financial Resource Strain:   . Difficulty of Paying Living Expenses:   Food Insecurity:   . Worried About Charity fundraiser in the Last Year:   . Arboriculturist in the Last Year:   Transportation Needs:   . Film/video editor (Medical):   [name] Lack of Transportation (Non-Medical):   Physical Activity:   . Days of Exercise per Week:   . Minutes of Exercise per Session:   Stress:   . Feeling of Stress :   Social Connections:   . Frequency of Communication with Friends and Family:   . Frequency of Social Gatherings with Friends and Family:   . Attends Religious Services:   . Active Member of Clubs or Organizations:   . Attends Archivist Meetings:   [name]  Marital Status:    Family History  Problem Relation Age of Onset  . Cancer Mother        H&N, smoker  . Liver disease Mother   . Cancer Sister        lung, 2011, smoker  . Other Other        grandmother had mastectomy in her 73's unknown reason  . Macular degeneration Father   .  Diverticulitis Father   . Breast cancer Neg Hx     ASSESSMENT Recent Results: The most recent result is correlated with 16 mg per week: Lab Results  Component Value Date   INR 1.4 (A) 11/06/2019   INR 3.1 (A) 10/16/2019   INR 1.7 (A) 10/02/2019    Anticoagulation Dosing: Description   Take one of your 4mg  strength blue warfarin tablets on Monday and Friday. All other days, take only one-half (1/2) of your 4mg  strength blue warfarin tablets.     INR today: Subtherapeutic  PLAN Weekly dose was increased by 12.5% to 18 mg per week  There are no Patient Instructions on file for this visit. Patient advised to contact clinic or seek medical attention if signs/symptoms of bleeding or thromboembolism occur.  Patient verbalized understanding by repeating back information and was advised to contact me if further medication-related questions arise. Patient was also provided an information handout.  Follow-up Return in 2 weeks (on 11/20/2019) for Follow-up INR .  Acey Lav, PharmD  PGY1 Acute Care Pharmacy Resident  15 minutes spent face-to-face with the patient during the encounter. 50% of time spent on education, including signs/sx bleeding and clotting, as well as food and drug interactions with warfarin. 50% of time was spent on fingerprick POC INR sample collection,processing, results determination, and documentation in [email or web address]

## 2019-11-07 ENCOUNTER — Inpatient Hospital Stay: Payer: Medicare Other

## 2019-11-07 ENCOUNTER — Other Ambulatory Visit: Payer: Self-pay

## 2019-11-07 VITALS — BP 156/69 | HR 99 | Temp 98.5°F | Resp 18

## 2019-11-07 DIAGNOSIS — C679 Malignant neoplasm of bladder, unspecified: Secondary | ICD-10-CM

## 2019-11-07 DIAGNOSIS — Z5111 Encounter for antineoplastic chemotherapy: Secondary | ICD-10-CM | POA: Diagnosis not present

## 2019-11-07 MED ORDER — SODIUM CHLORIDE 0.9 % IV SOLN
Freq: Once | INTRAVENOUS | Status: AC
  Filled 2019-11-07: qty 250

## 2019-11-07 MED ORDER — SODIUM CHLORIDE 0.9 % IV SOLN
800.0000 mg/m2 | Freq: Once | INTRAVENOUS | Status: AC
  Administered 2019-11-07: 1558 mg via INTRAVENOUS
  Filled 2019-11-07: qty 40.98

## 2019-11-07 MED ORDER — PALONOSETRON HCL INJECTION 0.25 MG/5ML
INTRAVENOUS | Status: AC
  Filled 2019-11-07: qty 5

## 2019-11-07 MED ORDER — PALONOSETRON HCL INJECTION 0.25 MG/5ML
0.2500 mg | Freq: Once | INTRAVENOUS | Status: AC
  Administered 2019-11-07: 0.25 mg via INTRAVENOUS

## 2019-11-07 MED ORDER — SODIUM CHLORIDE 0.9 % IV SOLN
10.0000 mg | Freq: Once | INTRAVENOUS | Status: AC
  Administered 2019-11-07: 10 mg via INTRAVENOUS
  Filled 2019-11-07: qty 10

## 2019-11-07 MED ORDER — SODIUM CHLORIDE 0.9 % IV SOLN
150.0000 mg | Freq: Once | INTRAVENOUS | Status: AC
  Administered 2019-11-07: 150 mg via INTRAVENOUS
  Filled 2019-11-07: qty 150

## 2019-11-07 MED ORDER — SODIUM CHLORIDE 0.9% FLUSH
10.0000 mL | INTRAVENOUS | Status: DC | PRN
  Administered 2019-11-07: 10 mL
  Filled 2019-11-07: qty 10

## 2019-11-07 MED ORDER — HEPARIN SOD (PORK) LOCK FLUSH 100 UNIT/ML IV SOLN
500.0000 [IU] | Freq: Once | INTRAVENOUS | Status: AC | PRN
  Administered 2019-11-07: 16:00:00 500 [IU]
  Filled 2019-11-07: qty 5

## 2019-11-07 MED ORDER — SODIUM CHLORIDE 0.9 % IV SOLN
51.5000 mg/m2 | Freq: Once | INTRAVENOUS | Status: AC
  Administered 2019-11-07: 100 mg via INTRAVENOUS
  Filled 2019-11-07: qty 100

## 2019-11-07 MED ORDER — POTASSIUM CHLORIDE 2 MEQ/ML IV SOLN
Freq: Once | INTRAVENOUS | Status: AC
  Filled 2019-11-07: qty 10

## 2019-11-07 NOTE — Patient Instructions (Signed)
[facility] Discharge Instructions for Patients Receiving Chemotherapy  Today you received the following chemotherapy agents: gemcitabine and cisplatin.  To help prevent nausea and vomiting after your treatment, we encourage you to take your nausea medication as directed.   If you develop nausea and vomiting that is not controlled by your nausea medication, call the clinic.   BELOW ARE SYMPTOMS THAT SHOULD BE REPORTED IMMEDIATELY:  *FEVER GREATER THAN 100.5 F  *CHILLS WITH OR WITHOUT FEVER  NAUSEA AND VOMITING THAT IS NOT CONTROLLED WITH YOUR NAUSEA MEDICATION  *UNUSUAL SHORTNESS OF BREATH  *UNUSUAL BRUISING OR BLEEDING  TENDERNESS IN MOUTH AND THROAT WITH OR WITHOUT PRESENCE OF ULCERS  *URINARY PROBLEMS  *BOWEL PROBLEMS  UNUSUAL RASH Items with * indicate a potential emergency and should be followed up as soon as possible.  Feel free to call the clinic should you have any questions or concerns. The clinic phone number is [phone].  Please show the CHEMO ALERT CARD at check-in to the Emergency Department and triage nurse.   

## 2019-11-07 NOTE — Progress Notes (Signed)
Spoke with Dr. Alen Blew - patient to restart calcium supplement at home PO BID (~600mg /dose). Recommended she take calcium citrate due to concomitant PPI - she was pretty sure that this was the formulation she had. Also educated on separation from colestipol and levothyroxine. Patient understood plan.   She also stated the she was having some leg cramps. Mg level ordered for future cisplatin doses to monitor this electrolyte status in addition to the calcium.   Demetrius Charity, PharmD, BCPS, [facility] Pharmacist Pharmacy Phone: [phone] 11/07/2019

## 2019-11-08 NOTE — Progress Notes (Signed)
Pharmacist Chemotherapy Monitoring - Follow Up Assessment    I verify that I have reviewed each item in the below checklist:  . Regimen for the patient is scheduled for the appropriate day and plan matches scheduled date. [name] Appropriate non-routine labs are ordered dependent on drug ordered. . If applicable, additional medications reviewed and ordered per protocol based on lifetime cumulative doses and/or treatment regimen.   Plan for follow-up and/or issues identified: No . I-vent associated with next due treatment: No . MD and/or nursing notified: No  Pamela [name] D 11/08/2019 9:15 AM

## 2019-11-14 ENCOUNTER — Other Ambulatory Visit: Payer: Self-pay

## 2019-11-14 ENCOUNTER — Inpatient Hospital Stay: Payer: Medicare Other

## 2019-11-14 VITALS — BP 147/74 | HR 74 | Temp 98.3°F | Resp 18

## 2019-11-14 DIAGNOSIS — Z95828 Presence of other vascular implants and grafts: Secondary | ICD-10-CM

## 2019-11-14 DIAGNOSIS — Z5111 Encounter for antineoplastic chemotherapy: Secondary | ICD-10-CM | POA: Diagnosis not present

## 2019-11-14 DIAGNOSIS — C679 Malignant neoplasm of bladder, unspecified: Secondary | ICD-10-CM

## 2019-11-14 LAB — CBC WITH DIFFERENTIAL (CANCER CENTER ONLY)
Abs Immature Granulocytes: 0.06 10*3/uL (ref 0.00–0.07)
Basophils Absolute: 0.1 10*3/uL (ref 0.0–0.1)
Basophils Relative: 2 %
Eosinophils Absolute: 0.1 10*3/uL (ref 0.0–0.5)
Eosinophils Relative: 2 %
HCT: 28.7 % — ABNORMAL LOW (ref 36.0–46.0)
Hemoglobin: 9.6 g/dL — ABNORMAL LOW (ref 12.0–15.0)
Immature Granulocytes: 2 %
Lymphocytes Relative: 45 %
Lymphs Abs: 1.2 10*3/uL (ref 0.7–4.0)
MCH: 32.9 pg (ref 26.0–34.0)
MCHC: 33.4 g/dL (ref 30.0–36.0)
MCV: 98.3 fL (ref 80.0–100.0)
Monocytes Absolute: 0.3 10*3/uL (ref 0.1–1.0)
Monocytes Relative: 10 %
Neutro Abs: 1 10*3/uL — ABNORMAL LOW (ref 1.7–7.7)
Neutrophils Relative %: 39 %
Platelet Count: 241 10*3/uL (ref 150–400)
RBC: 2.92 MIL/uL — ABNORMAL LOW (ref 3.87–5.11)
RDW: 14.3 % (ref 11.5–15.5)
WBC Count: 2.6 10*3/uL — ABNORMAL LOW (ref 4.0–10.5)
nRBC: 0 % (ref 0.0–0.2)

## 2019-11-14 LAB — CMP (CANCER CENTER ONLY)
ALT: 21 U/L (ref 0–44)
AST: 29 U/L (ref 15–41)
Albumin: 3.8 g/dL (ref 3.5–5.0)
Alkaline Phosphatase: 51 U/L (ref 38–126)
Anion gap: 14 (ref 5–15)
BUN: 22 mg/dL (ref 8–23)
CO2: 27 mmol/L (ref 22–32)
Calcium: 7.8 mg/dL — ABNORMAL LOW (ref 8.9–10.3)
Chloride: 99 mmol/L (ref 98–111)
Creatinine: 1.57 mg/dL — ABNORMAL HIGH (ref 0.44–1.00)
GFR, Est AFR Am: 37 mL/min — ABNORMAL LOW (ref >60)
GFR, Estimated: 32 mL/min — ABNORMAL LOW (ref >60)
Glucose, Bld: 97 mg/dL (ref 70–99)
Potassium: 4.6 mmol/L (ref 3.5–5.1)
Sodium: 140 mmol/L (ref 135–145)
Total Bilirubin: 0.5 mg/dL (ref 0.3–1.2)
Total Protein: 6.8 g/dL (ref 6.5–8.1)

## 2019-11-14 LAB — MAGNESIUM: Magnesium: 0.4 mg/dL — CL (ref 1.7–2.4)

## 2019-11-14 MED ORDER — SODIUM CHLORIDE 0.9% FLUSH
10.0000 mL | INTRAVENOUS | Status: DC | PRN
  Administered 2019-11-14: 10 mL
  Filled 2019-11-14: qty 10

## 2019-11-14 MED ORDER — SODIUM CHLORIDE 0.9 % IV SOLN
Freq: Once | INTRAVENOUS | Status: AC
  Filled 2019-11-14: qty 250

## 2019-11-14 MED ORDER — PROCHLORPERAZINE MALEATE 10 MG PO TABS
ORAL_TABLET | ORAL | Status: AC
  Filled 2019-11-14: qty 1

## 2019-11-14 MED ORDER — HEPARIN SOD (PORK) LOCK FLUSH 100 UNIT/ML IV SOLN
500.0000 [IU] | Freq: Once | INTRAVENOUS | Status: AC | PRN
  Administered 2019-11-14: 500 [IU]
  Filled 2019-11-14: qty 5

## 2019-11-14 MED ORDER — MAGNESIUM SULFATE 2 GM/50ML IV SOLN
INTRAVENOUS | Status: AC
  Filled 2019-11-14: qty 50

## 2019-11-14 MED ORDER — SODIUM CHLORIDE 0.9 % IV SOLN
800.0000 mg/m2 | Freq: Once | INTRAVENOUS | Status: AC
  Administered 2019-11-14: 1558 mg via INTRAVENOUS
  Filled 2019-11-14: qty 40.98

## 2019-11-14 MED ORDER — SODIUM CHLORIDE 0.9% FLUSH
10.0000 mL | Freq: Once | INTRAVENOUS | Status: AC
  Administered 2019-11-14: 10 mL via INTRAVENOUS
  Filled 2019-11-14: qty 10

## 2019-11-14 MED ORDER — PROCHLORPERAZINE MALEATE 10 MG PO TABS
10.0000 mg | ORAL_TABLET | Freq: Once | ORAL | Status: AC
  Administered 2019-11-14: 10 mg via ORAL

## 2019-11-14 MED ORDER — MAGNESIUM SULFATE 2 GM/50ML IV SOLN
2.0000 g | Freq: Once | INTRAVENOUS | Status: AC
  Administered 2019-11-14: 2 g via INTRAVENOUS

## 2019-11-14 NOTE — Progress Notes (Signed)
Per Dr. Alen Blew, ok to treat with Scr 1.57 and ANC 1.0 and Mg 0.4. Patient to receive 2 g Mg along with infusion today. Jenna in pharmacy to put in orders.

## 2019-11-14 NOTE — Patient Instructions (Addendum)
[facility] Discharge Instructions for Patients Receiving Chemotherapy  Today you received the following chemotherapy agents: gemcitabine.  To help prevent nausea and vomiting after your treatment, we encourage you to take your nausea medication as directed.   If you develop nausea and vomiting that is not controlled by your nausea medication, call the clinic.   BELOW ARE SYMPTOMS THAT SHOULD BE REPORTED IMMEDIATELY:  *FEVER GREATER THAN 100.5 F  *CHILLS WITH OR WITHOUT FEVER  NAUSEA AND VOMITING THAT IS NOT CONTROLLED WITH YOUR NAUSEA MEDICATION  *UNUSUAL SHORTNESS OF BREATH  *UNUSUAL BRUISING OR BLEEDING  TENDERNESS IN MOUTH AND THROAT WITH OR WITHOUT PRESENCE OF ULCERS  *URINARY PROBLEMS  *BOWEL PROBLEMS  UNUSUAL RASH Items with * indicate a potential emergency and should be followed up as soon as possible.  Feel free to call the clinic should you have any questions or concerns. The clinic phone number is [phone].  Please show the [place] at check-in to the Emergency Department and triage nurse.  Magnesium Sulfate injection What is this medicine? MAGNESIUM SULFATE (mag NEE zee um SUL fate) is an electrolyte injection commonly used to treat low magnesium levels in your blood. It is also used to prevent or control seizures in women with preeclampsia or eclampsia. This medicine may be used for other purposes; ask your health care provider or pharmacist if you have questions. What should I tell my health care provider before I take this medicine? They need to know if you have any of these conditions:  heart disease  history of irregular heart beat  kidney disease  an unusual or allergic reaction to magnesium sulfate, medicines, foods, dyes, or preservatives  pregnant or trying to get pregnant  breast-feeding How should I use this medicine? This medicine is for infusion into a vein. It is given by a health care professional in a  hospital or clinic setting. Talk to your pediatrician regarding the use of this medicine in children. While this drug may be prescribed for selected conditions, precautions do apply. Overdosage: If you think you have taken too much of this medicine contact a poison control center or emergency room at once. NOTE: This medicine is only for you. Do not share this medicine with others. What if I miss a dose? This does not apply. What may interact with this medicine? This medicine may interact with the following medications:  certain medicines for anxiety or sleep  certain medicines for seizures like phenobarbital  digoxin  medicines that relax muscles for surgery  narcotic medicines for pain This list may not describe all possible interactions. Give your health care provider a list of all the medicines, herbs, non-prescription drugs, or dietary supplements you use. Also tell them if you smoke, drink alcohol, or use illegal drugs. Some items may interact with your medicine. What should I watch for while using this medicine? Your condition will be monitored carefully while you are receiving this medicine. You may need blood work done while you are receiving this medicine. What side effects may I notice from receiving this medicine? Side effects that you should report to your doctor or health care professional as soon as possible:  allergic reactions like skin rash, itching or hives, swelling of the face, lips, or tongue  facial flushing  muscle weakness  signs and symptoms of low blood pressure like dizziness; feeling faint or lightheaded, falls; unusually weak or tired  signs and symptoms of a dangerous change in heartbeat or heart rhythm  rhythm like chest pain; dizziness; fast or irregular heartbeat; palpitations; breathing problems °· sweating °This list may not describe all possible side effects. Call your doctor for medical advice about side effects. You may report side  effects to FDA at 1-800-FDA-1088. °Where should I keep my medicine? °This drug is given in a hospital or clinic and will not be stored at home. °NOTE: This sheet is a summary. It may not cover all possible information. If you have questions about this medicine, talk to your doctor, pharmacist, or health care provider. °© 2020 Elsevier/Gold Standard (2016-01-08 12:31:42) ° ° °

## 2019-11-15 ENCOUNTER — Encounter: Payer: Self-pay | Admitting: Oncology

## 2019-11-16 ENCOUNTER — Inpatient Hospital Stay: Payer: Medicare Other

## 2019-11-16 ENCOUNTER — Other Ambulatory Visit: Payer: Self-pay

## 2019-11-16 VITALS — BP 161/84 | HR 82 | Temp 98.2°F | Resp 18

## 2019-11-16 DIAGNOSIS — Z5111 Encounter for antineoplastic chemotherapy: Secondary | ICD-10-CM | POA: Diagnosis not present

## 2019-11-16 DIAGNOSIS — C679 Malignant neoplasm of bladder, unspecified: Secondary | ICD-10-CM

## 2019-11-16 MED ORDER — SODIUM CHLORIDE 0.9% FLUSH
10.0000 mL | Freq: Once | INTRAVENOUS | Status: AC
  Administered 2019-11-16: 10 mL via INTRAVENOUS
  Filled 2019-11-16: qty 10

## 2019-11-16 MED ORDER — MAGNESIUM SULFATE 2 GM/50ML IV SOLN
2.0000 g | Freq: Once | INTRAVENOUS | Status: AC
  Administered 2019-11-16: 2 g via INTRAVENOUS

## 2019-11-16 MED ORDER — HEPARIN SOD (PORK) LOCK FLUSH 100 UNIT/ML IV SOLN
500.0000 [IU] | Freq: Once | INTRAVENOUS | Status: AC
  Administered 2019-11-16: 500 [IU] via INTRAVENOUS
  Filled 2019-11-16: qty 5

## 2019-11-16 MED ORDER — MAGNESIUM SULFATE 2 GM/50ML IV SOLN
INTRAVENOUS | Status: AC
  Filled 2019-11-16: qty 50

## 2019-11-16 MED ORDER — SODIUM CHLORIDE 0.9 % IV SOLN
INTRAVENOUS | Status: DC
  Filled 2019-11-16: qty 250

## 2019-11-16 NOTE — Patient Instructions (Signed)
Magnesium Sulfate injection What is this medicine? MAGNESIUM SULFATE (mag NEE zee um SUL fate) is an electrolyte injection commonly used to treat low magnesium levels in your blood. It is also used to prevent or control seizures in women with preeclampsia or eclampsia. This medicine may be used for other purposes; ask your health care provider or pharmacist if you have questions. What should I tell my health care provider before I take this medicine? They need to know if you have any of these conditions:  heart disease  history of irregular heart beat  kidney disease  an unusual or allergic reaction to magnesium sulfate, medicines, foods, dyes, or preservatives  pregnant or trying to get pregnant  breast-feeding How should I use this medicine? This medicine is for infusion into a vein. It is given by a health care professional in a hospital or clinic setting. Talk to your pediatrician regarding the use of this medicine in children. While this drug may be prescribed for selected conditions, precautions do apply. Overdosage: If you think you have taken too much of this medicine contact a poison control center or emergency room at once. NOTE: This medicine is only for you. Do not share this medicine with others. What if I miss a dose? This does not apply. What may interact with this medicine? This medicine may interact with the following medications:  certain medicines for anxiety or sleep  certain medicines for seizures like phenobarbital  digoxin  medicines that relax muscles for surgery  narcotic medicines for pain This list may not describe all possible interactions. Give your health care provider a list of all the medicines, herbs, non-prescription drugs, or dietary supplements you use. Also tell them if you smoke, drink alcohol, or use illegal drugs. Some items may interact with your medicine. What should I watch for while using this medicine? Your condition will be  monitored carefully while you are receiving this medicine. You may need blood work done while you are receiving this medicine. What side effects may I notice from receiving this medicine? Side effects that you should report to your doctor or health care professional as soon as possible:  allergic reactions like skin rash, itching or hives, swelling of the face, lips, or tongue  facial flushing  muscle weakness  signs and symptoms of low blood pressure like dizziness; feeling faint or lightheaded, falls; unusually weak or tired  signs and symptoms of a dangerous change in heartbeat or heart rhythm like chest pain; dizziness; fast or irregular heartbeat; palpitations; breathing problems  sweating This list may not describe all possible side effects. Call your doctor for medical advice about side effects. You may report side effects to FDA at 1-800-FDA-1088. Where should I keep my medicine? This drug is given in a hospital or clinic and will not be stored at home. NOTE: This sheet is a summary. It may not cover all possible information. If you have questions about this medicine, talk to your doctor, pharmacist, or health care provider.  2020 Elsevier/Gold Standard (2016-01-08 12:31:42)  

## 2019-11-17 ENCOUNTER — Other Ambulatory Visit: Payer: Self-pay | Admitting: Internal Medicine

## 2019-11-17 ENCOUNTER — Other Ambulatory Visit: Payer: Self-pay | Admitting: Urology

## 2019-11-20 ENCOUNTER — Ambulatory Visit (INDEPENDENT_AMBULATORY_CARE_PROVIDER_SITE_OTHER): Payer: Medicare Other | Admitting: Pharmacist

## 2019-11-20 DIAGNOSIS — Z7901 Long term (current) use of anticoagulants: Secondary | ICD-10-CM | POA: Diagnosis not present

## 2019-11-20 DIAGNOSIS — D6869 Other thrombophilia: Secondary | ICD-10-CM | POA: Diagnosis not present

## 2019-11-20 LAB — POCT INR: INR: 1.8 — AB (ref 2.0–3.0)

## 2019-11-20 NOTE — Patient Instructions (Signed)
Patient instructed to take medications as defined in the Anti-coagulation Track section of this encounter.  Patient instructed to take today's dose.  Patient instructed to take one of your 4mg strength blue-warfarin tablets on Mondays, Wednesdays and Fridays. All other days, take only one-half (1/2) of your 4mg strength blue warfarin tablets. Patient verbalized understanding of these instructions.   

## 2019-11-20 NOTE — Progress Notes (Signed)
Anticoagulation Management Pamela [name] is a 74 y.o. female who reports to the clinic for monitoring of warfarin treatment.    Indication: Secondary hypercoagulable state; History of VTE (remote); Long term current use of anticoagulant.    Duration: indefinite Supervising physician: Lenice Pressman, MD, PhD  Anticoagulation Clinic Visit History: Patient does not report signs/symptoms of bleeding or thromboembolism. Other recent changes: No diet, medications, lifestyle EXCEPT AS DOCUMENTED IN PATIENT FINDINGS.  Anticoagulation Episode Summary    Current INR goal:  2.0-3.0  TTR:  68.7 % (9.2 y)  Next INR check:  12/11/2019  INR from last check:  1.8 (11/20/2019)  Weekly max warfarin dose:    Target end date:  Indefinite  INR check location:  Anticoagulation Clinic  Preferred lab:    Send INR reminders to:  [facility]   Indications   Secondary hypercoagulable state ([place]) [D68.69] DVT HX OF (Resolved) [id number]       Comments:        Anticoagulation Care Providers    Provider Role Specialty Phone number   Burman Freestone, MD  Internal Medicine [phone]      Allergies  Allergen Reactions  . Ace Inhibitors Cough    Current Outpatient Medications:  .  Ascorbic Acid (VITAMIN C PO), Take 1 tablet by mouth every evening., Disp: , Rfl:  .  CALCIUM PO, Take 1 tablet by mouth 2 (two) times daily. Calcium citrate - 600mg  twice daily. Separate from levothyroxine by 4 hours. Take 1 hour before or 4 hours after colestipol., Disp: , Rfl:  .  colestipol (COLESTID) 1 g tablet, TAKE 10 TABLETS BY MOUTH EVERY DAY (Patient taking differently: Take 6 g by mouth daily at 12 noon. ), Disp: 900 tablet, Rfl: 3 .  cyanocobalamin 1000 MCG tablet, Take 1,000 mcg by mouth daily., Disp: , Rfl:  .  levothyroxine (SYNTHROID) 88 MCG tablet, TAKE ONE (1) TABLET BY MOUTH EVERY DAY (Patient taking differently: Take 88 mcg by mouth daily before breakfast. ), Disp: 90 tablet, Rfl: 3 .  losartan  (COZAAR) 25 MG tablet, TAKE 1 TABLET BY MOUTH ONCE DAILY, Disp: 90 tablet, Rfl: 3 .  Multiple Vitamin (MULTIVITAMIN WITH MINERALS) TABS tablet, Take 1 tablet by mouth daily in the afternoon., Disp: , Rfl:  .  pantoprazole (PROTONIX) 40 MG tablet, TAKE 1 TABLET BY MOUTH ONCE DAILY, Disp: 90 tablet, Rfl: 3 .  VITAMIN D PO, Take 1 capsule by mouth every evening., Disp: , Rfl:  .  warfarin (COUMADIN) 4 MG tablet, TAKE ONE TABLET BY MOUTH ON MONDAYS AND FRIDAYS AND TAKE ONE-HALF TABLET ALL OTHER DAYS (Patient taking differently: Take 2 mg by mouth See admin instructions. Take 2 mg daily and 4mg  on Monday), Disp: 65 tablet, Rfl: 11 .  HYDROcodone-acetaminophen (NORCO) 5-325 MG tablet, Take 1 tablet by mouth every 4 (four) hours as needed for moderate pain., Disp: 6 tablet, Rfl: 0 .  lidocaine-prilocaine (EMLA) cream, Apply 1 application topically as needed., Disp: 30 g, Rfl: 0 .  prochlorperazine (COMPAZINE) 10 MG tablet, Take 1 tablet (10 mg total) by mouth every 6 (six) hours as needed for nausea or vomiting. (Patient not taking: Reported on 11/20/2019), Disp: 30 tablet, Rfl: 0 Past Medical History:  Diagnosis Date  . bladder ca dx'd 09/05/2019  . CHOLELITHIASIS, WITH OBSTRUCTION 04/21/2006   s/p ERCP,sprincterotomy, stent ([facility])  . Complication of anesthesia   . 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 GLAUCOMA 04/24/2008  .  HYPERLIPIDEMIA 06/03/2006  . Hypertension   . Hypothyroidism lifelong  . OBESITY, MILD 04/24/2008  . PONV (postoperative nausea and vomiting)   . Tenosynovitis 01/2004   Sypher   Social History   Socioeconomic History  . Marital status: Widowed    Spouse name: Not on file  . Number of children: Not on file  . Years of education: Not on file  . Highest education level: Not on file  Occupational History  . 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 Smoking  status: Former Smoker    Packs/day: 1.00    Years: 20.00    Pack years: 20.00    Quit date: 07/06/1985    Years since quitting: 34.3  . Smokeless tobacco: Former Systems developer  . Tobacco comment: Quit 1988  Substance and Sexual Activity  . Alcohol use: Yes    Comment: 2-3 glasses of wine daily.  . Drug use: No  . Sexual activity: Not on file  Other Topics Concern  . 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       Current Social History 10/18/2019        Patient lives alone in a 2 level home where laundry is in the basement. There is one step without handrail up to the entrance patient uses.      Patient's method of transportation is personal car.      The highest level of education was advanced degree; Master's in Education.      The patient currently retired.      Identified important Relationships are My daughter, good friend, Manuela Schwartz who is starting with dementia. Several women from church and [place].      Pets : None       Interests / Fun: Read a lot, take walks, [employer] with church groups and [place] groups.       Current Stressors: "Cancer. And unable to get knee replacement last year 2/2 Covid pandemic and this year 2/2 Cancer. Hair is starting to fall out 2/2 chemo"      Religious / Personal Beliefs: "Episcopalian, Christian. I believe in God and [place]."       L. Ducatte, BSN, RN-BC       Social Determinants of Health   Financial Resource Strain:   . Difficulty of Paying Living Expenses:   Food Insecurity:   . Worried About Charity fundraiser in the Last Year:   . Arboriculturist in the Last Year:   Transportation Needs:   . Film/video editor (Medical):   [name] Lack of Transportation (Non-Medical):   Physical Activity:   . Days of Exercise per Week:   . Minutes of Exercise per Session:   Stress:   . Feeling of Stress :   Social Connections:   . Frequency of Communication with  Friends and Family:   . Frequency of Social Gatherings with Friends and Family:   . Attends Religious Services:   . Active Member of Clubs or Organizations:   . Attends Archivist Meetings:   [name] Marital Status:    Family History  Problem Relation Age of Onset  . Cancer Mother        H&N, smoker  . Liver disease Mother   . Cancer Sister  lung, 2011, smoker  . Other Other        grandmother had mastectomy in her 75's unknown reason  . Macular degeneration Father   . Diverticulitis Father   . Breast cancer Neg Hx     ASSESSMENT Recent Results: The most recent result is correlated with 18 mg per week: Lab Results  Component Value Date   INR 1.8 (A) 11/20/2019   INR 1.4 (A) 11/06/2019   INR 3.1 (A) 10/16/2019    Anticoagulation Dosing: Description   Take one of your 4mg  strength blue warfarin tablets on Mondays , Wednesdays and Fridays.  All other days, take only one-half (1/2) of your 4mg  strength blue warfarin tablets.     INR today: Subtherapeutic  PLAN Weekly dose was increased by 11% to 20 mg per week  Patient Instructions  Patient instructed to take medications as defined in the Anti-coagulation Track section of this encounter.  Patient instructed to take today's dose.  Patient instructed to take one of your 4mg  strength blue warfarin tablets on Mondays , Wednesdays and Fridays.  All other days, take only one-half (1/2) of your 4mg  strength blue warfarin tablets. Patient verbalized understanding of these instructions.    Patient advised to contact clinic or seek medical attention if signs/symptoms of bleeding or thromboembolism occur.  Patient verbalized understanding by repeating back information and was advised to contact me if further medication-related questions arise. Patient was also provided an information handout.  Follow-up Return in 3 weeks (on 12/11/2019) for Follow up INR.  Pennie Banter, PharmD, CPP  15 minutes spent face-to-face  with the patient during the encounter. 50% of time spent on education, including signs/sx bleeding and clotting, as well as food and drug interactions with warfarin. 50% of time was spent on fingerprick POC INR sample collection,processing, results determination, and documentation in http://www.kim.net/.

## 2019-11-21 NOTE — Progress Notes (Signed)
INTERNAL MEDICINE TEACHING ATTENDING ADDENDUM  I agree with pharmacy recommendations as outlined in their note.   Monice Lundy N Shina Wass, MD  

## 2019-11-27 MED FILL — Dexamethasone Sodium Phosphate Inj 100 MG/10ML: INTRAMUSCULAR | Qty: 1 | Status: AC

## 2019-11-27 MED FILL — Fosaprepitant Dimeglumine For IV Infusion 150 MG (Base Eq): INTRAVENOUS | Qty: 5 | Status: AC

## 2019-11-28 ENCOUNTER — Ambulatory Visit: Payer: Medicare Other

## 2019-11-28 ENCOUNTER — Inpatient Hospital Stay: Payer: Medicare Other

## 2019-11-28 ENCOUNTER — Other Ambulatory Visit: Payer: Self-pay

## 2019-11-28 ENCOUNTER — Telehealth: Payer: Self-pay

## 2019-11-28 ENCOUNTER — Inpatient Hospital Stay: Payer: Medicare Other | Admitting: Oncology

## 2019-11-28 VITALS — BP 178/84 | HR 101 | Temp 97.5°F | Resp 20 | Ht 64.0 in | Wt 184.3 lb

## 2019-11-28 DIAGNOSIS — Z95828 Presence of other vascular implants and grafts: Secondary | ICD-10-CM

## 2019-11-28 DIAGNOSIS — C679 Malignant neoplasm of bladder, unspecified: Secondary | ICD-10-CM

## 2019-11-28 DIAGNOSIS — Z5111 Encounter for antineoplastic chemotherapy: Secondary | ICD-10-CM | POA: Diagnosis not present

## 2019-11-28 LAB — CMP (CANCER CENTER ONLY)
ALT: 12 U/L (ref 0–44)
AST: 21 U/L (ref 15–41)
Albumin: 3.8 g/dL (ref 3.5–5.0)
Alkaline Phosphatase: 49 U/L (ref 38–126)
Anion gap: 12 (ref 5–15)
BUN: 15 mg/dL (ref 8–23)
CO2: 25 mmol/L (ref 22–32)
Calcium: 7.7 mg/dL — ABNORMAL LOW (ref 8.9–10.3)
Chloride: 103 mmol/L (ref 98–111)
Creatinine: 1.37 mg/dL — ABNORMAL HIGH (ref 0.44–1.00)
GFR, Est AFR Am: 44 mL/min — ABNORMAL LOW (ref >60)
GFR, Estimated: 38 mL/min — ABNORMAL LOW (ref >60)
Glucose, Bld: 91 mg/dL (ref 70–99)
Potassium: 4.5 mmol/L (ref 3.5–5.1)
Sodium: 140 mmol/L (ref 135–145)
Total Bilirubin: 0.5 mg/dL (ref 0.3–1.2)
Total Protein: 6.8 g/dL (ref 6.5–8.1)

## 2019-11-28 LAB — MAGNESIUM: Magnesium: 0.5 mg/dL — CL (ref 1.7–2.4)

## 2019-11-28 LAB — CBC WITH DIFFERENTIAL (CANCER CENTER ONLY)
Abs Immature Granulocytes: 0.06 10*3/uL (ref 0.00–0.07)
Basophils Absolute: 0 10*3/uL (ref 0.0–0.1)
Basophils Relative: 1 %
Eosinophils Absolute: 0.1 10*3/uL (ref 0.0–0.5)
Eosinophils Relative: 1 %
HCT: 25.4 % — ABNORMAL LOW (ref 36.0–46.0)
Hemoglobin: 8.6 g/dL — ABNORMAL LOW (ref 12.0–15.0)
Immature Granulocytes: 1 %
Lymphocytes Relative: 23 %
Lymphs Abs: 1.3 10*3/uL (ref 0.7–4.0)
MCH: 33.2 pg (ref 26.0–34.0)
MCHC: 33.9 g/dL (ref 30.0–36.0)
MCV: 98.1 fL (ref 80.0–100.0)
Monocytes Absolute: 1.5 10*3/uL — ABNORMAL HIGH (ref 0.1–1.0)
Monocytes Relative: 26 %
Neutro Abs: 2.8 10*3/uL (ref 1.7–7.7)
Neutrophils Relative %: 48 %
Platelet Count: 292 10*3/uL (ref 150–400)
RBC: 2.59 MIL/uL — ABNORMAL LOW (ref 3.87–5.11)
RDW: 17.2 % — ABNORMAL HIGH (ref 11.5–15.5)
WBC Count: 5.8 10*3/uL (ref 4.0–10.5)
nRBC: 0 % (ref 0.0–0.2)

## 2019-11-28 MED ORDER — SODIUM CHLORIDE 0.9% FLUSH
10.0000 mL | INTRAVENOUS | Status: DC | PRN
  Administered 2019-11-28: 10 mL via INTRAVENOUS
  Filled 2019-11-28: qty 10

## 2019-11-28 MED ORDER — HEPARIN SOD (PORK) LOCK FLUSH 100 UNIT/ML IV SOLN
500.0000 [IU] | Freq: Once | INTRAVENOUS | Status: AC
  Administered 2019-11-28: 500 [IU] via INTRAVENOUS
  Filled 2019-11-28: qty 5

## 2019-11-28 NOTE — Telephone Encounter (Signed)
Critical Value: Magnesium 0.5 Lenox Ponds, LPN notified

## 2019-11-28 NOTE — Progress Notes (Signed)
Hematology and Oncology Follow Up Visit  Pamela [name] [id number] 29-May-1946 74 y.o. 11/28/2019 8:48 AM Pamela [name], MDButcher, Real Cons, MD   Principle Diagnosis: 74 year old with T2N0 high-grade urothelial carcinoma of the bladder diagnosed in February 2021. Pamela [name]   Prior Therapy:  She is status post TURBT on February 22 which showed a mixed urothelial carcinoma with small cell component.  Neoadjuvant chemotherapy with gemcitabine and cisplatin started on March 24th 2021.   She completed 3 cycles of therapy on Nov 14, 2019.  Current therapy: Under evaluation for radical cystectomy.  Interim History: Pamela [name] returns today for a repeat evaluation.  Since the last visit, she completed cycle 3 of therapy without complications.  She denies any nausea, vomiting or abdominal pain.  She denies any hematochezia or melena.  She does report some fatigue tiredness associated with chemotherapy.  She denies any abdominal pain, flank pain or hematuria.     Medications: Reviewed without changes. Current Outpatient Medications  Medication Sig Dispense Refill  . Ascorbic Acid (VITAMIN C PO) Take 1 tablet by mouth every evening.    Pamela [name] CALCIUM PO Take 1 tablet by mouth 2 (two) times daily. Calcium citrate - 600mg  twice daily. Separate from levothyroxine by 4 hours. Take 1 hour before or 4 hours after colestipol.    . colestipol (COLESTID) 1 g tablet TAKE 10 TABLETS BY MOUTH EVERY DAY (Patient taking differently: Take 6 g by mouth daily at 12 noon. ) 900 tablet 3  . cyanocobalamin 1000 MCG tablet Take 1,000 mcg by mouth daily.    Pamela [name] HYDROcodone-acetaminophen (NORCO) 5-325 MG tablet Take 1 tablet by mouth every 4 (four) hours as needed for moderate pain. 6 tablet 0  . levothyroxine (SYNTHROID) 88 MCG tablet TAKE ONE (1) TABLET BY MOUTH EVERY DAY (Patient taking differently: Take 88 mcg by mouth daily before breakfast. ) 90 tablet 3  . lidocaine-prilocaine (EMLA) cream Apply 1 application  topically as needed. 30 g 0  . losartan (COZAAR) 25 MG tablet TAKE 1 TABLET BY MOUTH ONCE DAILY 90 tablet 3  . Multiple Vitamin (MULTIVITAMIN WITH MINERALS) TABS tablet Take 1 tablet by mouth daily in the afternoon.    . pantoprazole (PROTONIX) 40 MG tablet TAKE 1 TABLET BY MOUTH ONCE DAILY 90 tablet 3  . prochlorperazine (COMPAZINE) 10 MG tablet Take 1 tablet (10 mg total) by mouth every 6 (six) hours as needed for nausea or vomiting. (Patient not taking: Reported on 11/20/2019) 30 tablet 0  . VITAMIN D PO Take 1 capsule by mouth every evening.    . warfarin (COUMADIN) 4 MG tablet TAKE ONE TABLET BY MOUTH ON MONDAYS AND FRIDAYS AND TAKE ONE-HALF TABLET ALL OTHER DAYS (Patient taking differently: Take 2 mg by mouth See admin instructions. Take 2 mg daily and 4mg  on Monday) 65 tablet 11   No current facility-administered medications for this visit.   Facility-Administered Medications Ordered in Other Visits  Medication Dose Route Frequency Provider Last Rate Last Admin  . sodium chloride flush (NS) 0.9 % injection 10 mL  10 mL Intravenous PRN Wyatt Portela, MD   10 mL at 11/28/19 0843     Allergies:  Allergies  Allergen Reactions  . Ace Inhibitors Cough      Physical Exam:  Blood pressure (!) 178/84, pulse (!) 101, temperature (!) 97.5 F (36.4 C), temperature source Temporal, resp. rate 20, height 5\' 4"  (1.626 m), weight 184 lb 4.8 oz (83.6 kg), SpO2 98 %.   ECOG: 1  General appearance: Alert, awake without any distress. Head: Atraumatic without abnormalities Oropharynx: Without any thrush or ulcers. Eyes: No scleral icterus. Lymph nodes: No lymphadenopathy noted in the cervical, supraclavicular, or axillary nodes Heart:regular rate and rhythm, without any murmurs or gallops.   Lung: Clear to auscultation without any rhonchi, wheezes or dullness to percussion. Abdomin: Soft, nontender without any shifting dullness or ascites. Musculoskeletal: No clubbing or  cyanosis. Neurological: No motor or sensory deficits. Skin: No rashes or lesions.      Lab Results: Lab Results  Component Value Date   WBC 2.6 (L) 11/14/2019   HGB 9.6 (L) 11/14/2019   HCT 28.7 (L) 11/14/2019   MCV 98.3 11/14/2019   PLT 241 11/14/2019     Chemistry      Component Value Date/Time   NA 140 11/14/2019 0924   NA 143 06/13/2019 1520   K 4.6 11/14/2019 0924   CL 99 11/14/2019 0924   CO2 27 11/14/2019 0924   BUN 22 11/14/2019 0924   BUN 15 06/13/2019 1520   CREATININE 1.57 (H) 11/14/2019 0924   CREATININE 0.78 12/13/2014 0954      Component Value Date/Time   CALCIUM 7.8 (L) 11/14/2019 0924   ALKPHOS 51 11/14/2019 0924   AST 29 11/14/2019 0924   ALT 21 11/14/2019 0924   BILITOT 0.5 11/14/2019 0924        Impression and Plan:   74 year old woman with:  1.    Bladder cancer diagnosed in February 2021.    She was found to have T2N0 with mixed histology with glandular as well as small cell component.   The natural course of this disease was reviewed and further treatment options were discussed.  She completed 3 cycles of chemotherapy without any major complications and she is ready to proceed with surgical intervention.  Risks and benefits of this approach of proceeding with cystectomy at this time was discussed.  She is agreeable to proceed and is already scheduled for July.   2. IV access: Port-A-Cath will continue to be flushed periodically off chemotherapy.  3. Antiemetics: No delayed nausea or vomiting reported at this time.  4. Renal function surveillance: Creatinine did increase after the last cycle of therapy will be monitored forward.  I anticipate improvement off chemotherapy.  5. Goals of care:Therapy remains curative at this time and aggressive measures are warranted.  6.  History of recurrent thrombosis: She continues to be on warfarin without any bleeding complications.  INR nearly therapeutic.  7.  Neutropenia: Resolved at  this time with absolute neutrophil count back to normal.  8.  Anemia: Related to chemotherapy and anticipate improvement in the next few weeks.  She does not require transfusion.  9. Follow-up: Will be after her surgery to assess her progress and future plan of surveillance.  30  minutes were dedicated to this visit.  The time was spent on updating her disease status, reviewing treatment options, addressing complications related to therapy and future plan of care.      Zola Button, MD 5/25/20218:48 AM

## 2019-11-28 NOTE — Telephone Encounter (Signed)
CRITICAL VALUE STICKER  CRITICAL VALUE: Magnesium 0.5  RECEIVER (on-site recipient of call): Lenox Ponds LPN  DATE & TIME NOTIFIED: 11/28/19 957  MESSENGER (representative from lab): Ihor Gully RN  MD NOTIFIED: Dr.Shadad  TIME OF NOTIFICATION: 1000  RESPONSE: Pt to get prescription for magnesium sent to pharmacy.

## 2019-11-28 NOTE — Telephone Encounter (Signed)
TC to Pt. Per Dr. Alen Blew to inform her that her magnesium level is low 0.5 per her lab results today. Informed her per Dr. Alen Blew she should take Magnesium oxide 400 mg daily for 30 days. Explained to Pt. she could get it over the counter. Pt. verbalized understanding stated she would pick it up today.

## 2019-11-29 ENCOUNTER — Telehealth: Payer: Self-pay | Admitting: Oncology

## 2019-11-29 NOTE — Telephone Encounter (Signed)
Scheduled appt per 5/25 los.  Left a vm of the appt date and time.

## 2019-12-05 ENCOUNTER — Ambulatory Visit: Payer: Medicare Other

## 2019-12-05 ENCOUNTER — Other Ambulatory Visit: Payer: Medicare Other

## 2019-12-11 ENCOUNTER — Ambulatory Visit (INDEPENDENT_AMBULATORY_CARE_PROVIDER_SITE_OTHER): Payer: Medicare Other | Admitting: Pharmacist

## 2019-12-11 DIAGNOSIS — Z86718 Personal history of other venous thrombosis and embolism: Secondary | ICD-10-CM | POA: Diagnosis not present

## 2019-12-11 DIAGNOSIS — D6869 Other thrombophilia: Secondary | ICD-10-CM

## 2019-12-11 LAB — POCT INR: INR: 2.5 (ref 2.0–3.0)

## 2019-12-11 NOTE — Patient Instructions (Signed)
Patient instructed to take medications as defined in the Anti-coagulation Track section of this encounter.  Patient instructed to take today's dose.  Patient instructed to take one of your 4mg  strength blue-warfarin tablets on Mondays, Wednesdays and Fridays. All other days, take only one-half (1/2) of your 4mg  strength blue warfarin tablets. Patient verbalized understanding of these instructions.

## 2019-12-11 NOTE — Progress Notes (Signed)
Anticoagulation Management Pamela [name] is a 74 y.o. female who reports to the clinic for monitoring of warfarin treatment.    Indication: Secondary hypercoagulable state; DVT, History of (resolved); Long term current use of anticoagulant.    Duration: indefinite Supervising physician: Joni Reining  Anticoagulation Clinic Visit History: Patient does not report signs/symptoms of bleeding or thromboembolism  Other recent changes: No diet, medications, lifestyle changes other than as noted (upcoming major surgery, 14-JUL-21.) Anticoagulation Episode Summary    Current INR goal:  2.0-3.0  TTR:  68.7 % (9.3 y)  Next INR check:  01/01/2020  INR from last check:  2.5 (12/11/2019)  Weekly max warfarin dose:    Target end date:  Indefinite  INR check location:  Anticoagulation Clinic  Preferred lab:    Send INR reminders to:  [facility]   Indications   Secondary hypercoagulable state ([place]) [D68.69] DVT HX OF (Resolved) [T55.732]       Comments:        Anticoagulation Care Providers    Provider Role Specialty Phone number   Burman Freestone, MD  Internal Medicine [phone]      Allergies  Allergen Reactions  . Ace Inhibitors Cough    Current Outpatient Medications:  .  Ascorbic Acid (VITAMIN C PO), Take 1 tablet by mouth every evening., Disp: , Rfl:  .  CALCIUM PO, Take 1 tablet by mouth 2 (two) times daily. Calcium citrate - 600mg  twice daily. Separate from levothyroxine by 4 hours. Take 1 hour before or 4 hours after colestipol., Disp: , Rfl:  .  colestipol (COLESTID) 1 g tablet, TAKE 10 TABLETS BY MOUTH EVERY DAY (Patient taking differently: Take 6 g by mouth daily at 12 noon. ), Disp: 900 tablet, Rfl: 3 .  cyanocobalamin 1000 MCG tablet, Take 1,000 mcg by mouth daily., Disp: , Rfl:  .  HYDROcodone-acetaminophen (NORCO) 5-325 MG tablet, Take 1 tablet by mouth every 4 (four) hours as needed for moderate pain., Disp: 6 tablet, Rfl: 0 .  levothyroxine (SYNTHROID) 88  MCG tablet, TAKE ONE (1) TABLET BY MOUTH EVERY DAY (Patient taking differently: Take 88 mcg by mouth daily before breakfast. ), Disp: 90 tablet, Rfl: 3 .  lidocaine-prilocaine (EMLA) cream, Apply 1 application topically as needed., Disp: 30 g, Rfl: 0 .  losartan (COZAAR) 25 MG tablet, TAKE 1 TABLET BY MOUTH ONCE DAILY, Disp: 90 tablet, Rfl: 3 .  magnesium oxide (MAG-OX) 400 MG tablet, Take 400 mg by mouth daily., Disp: , Rfl:  .  Multiple Vitamin (MULTIVITAMIN WITH MINERALS) TABS tablet, Take 1 tablet by mouth daily in the afternoon., Disp: , Rfl:  .  pantoprazole (PROTONIX) 40 MG tablet, TAKE 1 TABLET BY MOUTH ONCE DAILY, Disp: 90 tablet, Rfl: 3 .  prochlorperazine (COMPAZINE) 10 MG tablet, Take 1 tablet (10 mg total) by mouth every 6 (six) hours as needed for nausea or vomiting., Disp: 30 tablet, Rfl: 0 .  VITAMIN D PO, Take 1 capsule by mouth every evening., Disp: , Rfl:  .  warfarin (COUMADIN) 4 MG tablet, TAKE ONE TABLET BY MOUTH ON MONDAYS AND FRIDAYS AND TAKE ONE-HALF TABLET ALL OTHER DAYS (Patient taking differently: Take 2 mg by mouth See admin instructions. Take 2 mg daily and 4mg  on Monday), Disp: 65 tablet, Rfl: 11 Past Medical History:  Diagnosis Date  . bladder ca dx'd 09/05/2019  . CHOLELITHIASIS, WITH OBSTRUCTION 04/21/2006   s/p ERCP,sprincterotomy, stent ([facility])  . Complication of anesthesia   . 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 GLAUCOMA 04/24/2008  . HYPERLIPIDEMIA 06/03/2006  . Hypertension   . Hypothyroidism lifelong  . OBESITY, MILD 04/24/2008  . PONV (postoperative nausea and vomiting)   . Tenosynovitis 01/2004   Sypher   Social History   Socioeconomic History  . Marital status: Widowed    Spouse name: Not on file  . Number of children: Not on file  . Years of education: Not on file  . Highest education level: Not on file  Occupational History  . 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 Smoking status: Former Smoker    Packs/day: 1.00    Years: 20.00    Pack years: 20.00    Quit date: 07/06/1985    Years since quitting: 34.4  . Smokeless tobacco: Former Systems developer  . Tobacco comment: Quit 1988  Substance and Sexual Activity  . Alcohol use: Yes    Comment: 2-3 glasses of wine daily.  . Drug use: No  . Sexual activity: Not on file  Other Topics Concern  . 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       Current Social History 10/18/2019        Patient lives alone in a 2 level home where laundry is in the basement. There is one step without handrail up to the entrance patient uses.      Patient's method of transportation is personal car.      The highest level of education was advanced degree; Master's in Education.      The patient currently retired.      Identified important Relationships are My daughter, good friend, Manuela Schwartz who is starting with dementia. Several women from church and [place].      Pets : None       Interests / Fun: Read a lot, take walks, [employer] with church groups and [place] groups.       Current Stressors: "Cancer. And unable to get knee replacement last year 2/2 Covid pandemic and this year 2/2 Cancer. Hair is starting to fall out 2/2 chemo"      Religious / Personal Beliefs: "Episcopalian, Christian. I believe in God and [place]."       L. Ducatte, BSN, RN-BC       Social Determinants of Health   Financial Resource Strain:   . Difficulty of Paying Living Expenses:   Food Insecurity:   . Worried About Charity fundraiser in the Last Year:   . Arboriculturist in the Last Year:   Transportation Needs:   . Film/video editor (Medical):   [name] Lack of Transportation (Non-Medical):   Physical Activity:   . Days of Exercise per Week:   . Minutes of Exercise per Session:   Stress:   . Feeling of Stress :    Social Connections:   . Frequency of Communication with Friends and Family:   . Frequency of Social Gatherings with Friends and Family:   . Attends Religious Services:   . Active Member of Clubs or Organizations:   . Attends Archivist Meetings:   [name] Marital Status:    Family History  Problem Relation Age of Onset  . Cancer Mother        H&N, smoker  . Liver  disease Mother   . Cancer Sister        lung, 2011, smoker  . Other Other        grandmother had mastectomy in her 9's unknown reason  . Macular degeneration Father   . Diverticulitis Father   . Breast cancer Neg Hx     ASSESSMENT Recent Results: The most recent result is correlated with 20 mg per week: Lab Results  Component Value Date   INR 2.5 12/11/2019   INR 1.8 (A) 11/20/2019   INR 1.4 (A) 11/06/2019    Anticoagulation Dosing: Description   Take one of your 4mg  strength blue warfarin tablets on Mondays , Wednesdays and Fridays.  All other days, take only one-half (1/2) of your 4mg  strength blue warfarin tablets.     INR today: Therapeutic  PLAN Weekly dose was unchanged.  Patient Instructions  Patient instructed to take medications as defined in the Anti-coagulation Track section of this encounter.  Patient instructed to take today's dose.  Patient instructed to take one of your 4mg  strength blue-warfarin tablets on Mondays, Wednesdays and Fridays. All other days, take only one-half (1/2) of your 4mg  strength blue warfarin tablets. Patient verbalized understanding of these instructions.    Patient advised to contact clinic or seek medical attention if signs/symptoms of bleeding or thromboembolism occur.  Patient verbalized understanding by repeating back information and was advised to contact me if further medication-related questions arise. Patient was also provided an information handout.  Follow-up Return in 3 weeks (on 01/01/2020) for Follow up INR.  Pennie Banter, PharmD, CPP  15  minutes spent face-to-face with the patient during the encounter. 50% of time spent on education, including signs/sx bleeding and clotting, as well as food and drug interactions with warfarin. 50% of time was spent on fingerprick POC INR sample collection,processing, results determination, and documentation in [email or web address].kim.net/.

## 2019-12-18 ENCOUNTER — Telehealth: Payer: Self-pay

## 2019-12-18 NOTE — Telephone Encounter (Signed)
Unable to reach pt to sch an appt for medical clearance.

## 2019-12-25 ENCOUNTER — Encounter: Payer: Self-pay | Admitting: *Deleted

## 2020-01-01 ENCOUNTER — Ambulatory Visit: Payer: Medicare Other | Admitting: Pharmacist

## 2020-01-01 DIAGNOSIS — Z7901 Long term (current) use of anticoagulants: Secondary | ICD-10-CM

## 2020-01-01 DIAGNOSIS — D6869 Other thrombophilia: Secondary | ICD-10-CM

## 2020-01-01 LAB — POCT INR: INR: 2.9 (ref 2.0–3.0)

## 2020-01-01 NOTE — Progress Notes (Signed)
INTERNAL MEDICINE TEACHING ATTENDING ADDENDUM  I agree with pharmacy recommendations as outlined in their note.   Ramell Wacha N Garnetta Fedrick, MD  

## 2020-01-01 NOTE — Patient Instructions (Addendum)
Patient instructed to take medications as defined in the Anti-coagulation Track section of this encounter.  Patient instructed to take today's dose.  Patient instructed to take one tablet (4mg ) on Tuesdays and Fridays. All other days, take only 1/2 of your 4mg  strength warfarin tablet [2mg  dose].  Patient verbalized understanding of these instructions.     1. Patient will be provided a prescription for low-molecular-weight-heparin (enoxaparin/Lovenox) to provide you 1mg  of enoxaparin/Lovenox per kilogram of body weight, to be administered every 12 hours. 2. Last dose of warfarin (Coumadin) will be Friday, January 12, 2020 at [facility]. 3. Begin low-molecular-weight-heparin (LMWH), enoxaparin/Lovenox on Sunday, January 14, 2020 at 7:00AM. Your dose will be 80mg . 4. You will CONTINUE enoxaparin/Lovenox  on a 7:00AM/7:00PM schedule until your LAST DOSE, which will be the MORNING DOSE on Tuesday, July 13th, 2021 at 7:00AM. DO NOT ADMINISTER the evening dose on Tuesday, July 13th! 5. Day of surgery, Wednesday, July 14th,2021--No LMWH (enoxaparin/Lovenox). 6. Two days after surgery (48 hours) [Friday, July 16th, re-commence enoxaparin/Lovenox 1mg /kg (80mg ) every 12 hours at 7:00AM and 7:00PM 7. Re-commence warfarin on Friday, July 16th. Take one of your 7.5mg  warfarin tablets by mouth, daily at 6PM until seen in the Internal Medicine Anticoagulation Clinic on Monday,  July 19th 2021 at 9:15 am. for a fingerstick point of care INR determination and all subsequent warfarin/Coumadin dosing will be based upon your INR that morning. We will decide as to whether enoxaparin/Lovenox must continue, based upon your INR value. 8. Any questions regarding these instructions--call Pharmacist, [name], PharmD, CPP at [phone]. 9. If your surgery were to be cancelled or postponed, call Pharmacist, [name], PharmD, CPP at [phone].

## 2020-01-01 NOTE — Progress Notes (Signed)
Anticoagulation Management Pamela [name] is a 74 y.o. female who reports to the clinic for monitoring of warfarin treatment.    Indication: Secondary hypercoagulable state; current long term use of anticoagulant.    Duration: indefinite Supervising physician: Lenice Pressman, MD, PhD  Anticoagulation Clinic Visit History: Patient does not report signs/symptoms of bleeding or thromboembolism  Other recent changes: No diet, medications, lifestyle endorsed by the patient.  Anticoagulation Episode Summary    Current INR goal:  2.0-3.0  TTR:  68.9 % (9.3 y)  Next INR check:  01/22/2020  INR from last check:    Weekly max warfarin dose:    Target end date:  Indefinite  INR check location:  Anticoagulation Clinic  Preferred lab:    Send INR reminders to:  ANTICOAG [facility]   Indications   Secondary hypercoagulable state ([place]) [D68.69] DVT HX OF (Resolved) [V78.469]       Comments:        Anticoagulation Care Providers    Provider Role Specialty Phone number   Burman Freestone, MD  Internal Medicine [phone]      Allergies  Allergen Reactions  . Ace Inhibitors Cough    Current Outpatient Medications:  .  Ascorbic Acid (VITAMIN C PO), Take 1 tablet by mouth every evening., Disp: , Rfl:  .  CALCIUM PO, Take 1 tablet by mouth 2 (two) times daily. Calcium citrate - 600mg  twice daily. Separate from levothyroxine by 4 hours. Take 1 hour before or 4 hours after colestipol., Disp: , Rfl:  .  colestipol (COLESTID) 1 g tablet, TAKE 10 TABLETS BY MOUTH EVERY DAY (Patient taking differently: Take 6 g by mouth daily at 12 noon. ), Disp: 900 tablet, Rfl: 3 .  cyanocobalamin 1000 MCG tablet, Take 1,000 mcg by mouth daily., Disp: , Rfl:  .  HYDROcodone-acetaminophen (NORCO) 5-325 MG tablet, Take 1 tablet by mouth every 4 (four) hours as needed for moderate pain., Disp: 6 tablet, Rfl: 0 .  levothyroxine (SYNTHROID) 88 MCG tablet, TAKE ONE (1) TABLET BY MOUTH EVERY DAY (Patient taking  differently: Take 88 mcg by mouth daily before breakfast. ), Disp: 90 tablet, Rfl: 3 .  lidocaine-prilocaine (EMLA) cream, Apply 1 application topically as needed., Disp: 30 g, Rfl: 0 .  losartan (COZAAR) 25 MG tablet, TAKE 1 TABLET BY MOUTH ONCE DAILY, Disp: 90 tablet, Rfl: 3 .  magnesium oxide (MAG-OX) 400 MG tablet, Take 400 mg by mouth daily., Disp: , Rfl:  .  Multiple Vitamin (MULTIVITAMIN WITH MINERALS) TABS tablet, Take 1 tablet by mouth daily in the afternoon., Disp: , Rfl:  .  pantoprazole (PROTONIX) 40 MG tablet, TAKE 1 TABLET BY MOUTH ONCE DAILY, Disp: 90 tablet, Rfl: 3 .  prochlorperazine (COMPAZINE) 10 MG tablet, Take 1 tablet (10 mg total) by mouth every 6 (six) hours as needed for nausea or vomiting., Disp: 30 tablet, Rfl: 0 .  VITAMIN D PO, Take 1 capsule by mouth every evening., Disp: , Rfl:  .  warfarin (COUMADIN) 4 MG tablet, TAKE ONE TABLET BY MOUTH ON MONDAYS AND FRIDAYS AND TAKE ONE-HALF TABLET ALL OTHER DAYS (Patient taking differently: Take 2 mg by mouth See admin instructions. Take 2 mg daily and 4mg  on Monday), Disp: 65 tablet, Rfl: 11 Past Medical History:  Diagnosis Date  . bladder ca dx'd 09/05/2019  . CHOLELITHIASIS, WITH OBSTRUCTION 04/21/2006   s/p ERCP,sprincterotomy, stent ([facility])  . Complication of anesthesia   . 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  GLAUCOMA 04/24/2008  . HYPERLIPIDEMIA 06/03/2006  . Hypertension   . Hypothyroidism lifelong  . OBESITY, MILD 04/24/2008  . PONV (postoperative nausea and vomiting)   . Tenosynovitis 01/2004   Sypher   Social History   Socioeconomic History  . Marital status: Widowed    Spouse name: Not on file  . Number of children: Not on file  . Years of education: Not on file  . Highest education level: Not on file  Occupational History  . 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 Smoking status: Former  Smoker    Packs/day: 1.00    Years: 20.00    Pack years: 20.00    Quit date: 07/06/1985    Years since quitting: 34.5  . Smokeless tobacco: Former Systems developer  . Tobacco comment: Quit 1988  Vaping Use  . Vaping Use: Never used  Substance and Sexual Activity  . Alcohol use: Yes    Comment: 2-3 glasses of wine daily.  . Drug use: No  . Sexual activity: Not on file  Other Topics Concern  . 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 Then worked in [employer] with [employer].       Current Social History 10/18/2019        Patient lives alone in a 2 level home where laundry is in the basement. There is one step without handrail up to the entrance patient uses.      Patient's method of transportation is personal car.      The highest level of education was advanced degree; Master's in Education.      The patient currently retired.      Identified important Relationships are My daughter, good friend, Manuela Schwartz who is starting with dementia. Several women from church and [place].      Pets : None       Interests / Fun: Read a lot, take walks, [employer] with church groups and [place] groups.       Current Stressors: "Cancer. And unable to get knee replacement last year 2/2 Covid pandemic and this year 2/2 Cancer. Hair is starting to fall out 2/2 chemo"      Religious / Personal Beliefs: "Episcopalian, Christian. I believe in God and [place]."       L. [place], BSN, RN-BC       Social Determinants of Health   Financial Resource Strain:   . Difficulty of Paying Living Expenses:   Food Insecurity:   . Worried About Charity fundraiser in the Last Year:   . Arboriculturist in the Last Year:   Transportation Needs:   . Film/video editor (Medical):   [name] Lack of Transportation (Non-Medical):   Physical Activity:   . Days of Exercise per Week:   . Minutes of Exercise per Session:   Stress:   . Feeling of Stress :   Social Connections:   .  Frequency of Communication with Friends and Family:   . Frequency of Social Gatherings with Friends and Family:   . Attends Religious Services:   . Active Member of Clubs or Organizations:   . Attends Archivist Meetings:   [name] Marital Status:    Family History  Problem Relation Age of Onset  . Cancer Mother        H&N, smoker  .  Liver disease Mother   . Cancer Sister        lung, 2011, smoker  . Other Other        grandmother had mastectomy in her 56's unknown reason  . Macular degeneration Father   . Diverticulitis Father   . Breast cancer Neg Hx     ASSESSMENT Recent Results: The most recent result is correlated with 20 mg per week: Lab Results  Component Value Date   INR 2.9 01/01/2020   INR 2.5 12/11/2019   INR 1.8 (A) 11/20/2019    Anticoagulation Dosing: Description   Take one tablet (4mg ) on Tuesdays and Fridays. All other days, take only 1/2 of your 4mg  strength warfarin tablet [2mg  dose].      INR today: Therapeutic  PLAN Weekly dose was decreased by 10% to 18 mg per week  Patient Instructions  Patient instructed to take medications as defined in the Anti-coagulation Track section of this encounter.  Patient instructed to take today's dose.  Patient instructed to take one tablet (4mg ) on Tuesdays and Fridays. All other days, take only 1/2 of your 4mg  strength warfarin tablet [2mg  dose].  Patient verbalized understanding of these instructions.     1. Patient will be provided a prescription for low-molecular-weight-heparin (enoxaparin/Lovenox) to provide you 1mg  of enoxaparin/Lovenox per kilogram of body weight, to be administered every 12 hours. 2. Last dose of warfarin (Coumadin) will be Friday, January 12, 2020 at [facility]. 3. Begin low-molecular-weight-heparin (LMWH), enoxaparin/Lovenox on Sunday, January 14, 2020 at 7:00AM. Your dose will be 80mg . 4. You will CONTINUE enoxaparin/Lovenox  on a 7:00AM/7:00PM schedule until your LAST DOSE, which will be the  MORNING DOSE on Tuesday, July 13th, 2021 at 7:00AM. DO NOT ADMINISTER the evening dose on Tuesday, July 13th! 5. Day of surgery, Wednesday, July 14th,2021--No LMWH (enoxaparin/Lovenox). 6. Two days after surgery (48 hours) [Friday, July 16th, re-commence enoxaparin/Lovenox 1mg /kg (80mg ) every 12 hours at 7:00AM and 7:00PM 7. Re-commence warfarin on Friday, July 16th. Take one of your 7.5mg  warfarin tablets by mouth, daily at 6PM until seen in the Internal Medicine Anticoagulation Clinic on Monday,  July 19th 2021 at 9:15 am. for a fingerstick point of care INR determination and all subsequent warfarin/Coumadin dosing will be based upon your INR that morning. We will decide as to whether enoxaparin/Lovenox must continue, based upon your INR value. 8. Any questions regarding these instructions--call Pharmacist, [name], PharmD, CPP at [phone]. 9. If your surgery were to be cancelled or postponed, call Pharmacist, [name], PharmD, CPP at [phone].    Patient advised to contact clinic or seek medical attention if signs/symptoms of bleeding or thromboembolism occur.  Patient verbalized understanding by repeating back information and was advised to contact me if further medication-related questions arise. Patient was also provided an information handout.  Follow-up Return in 3 weeks (on 01/22/2020) for Follow up INR.  [name] Zeda Gangwer  15 minutes spent face-to-face with the patient during the encounter. 50% of time spent on education, including signs/sx bleeding and clotting, as well as food and drug interactions with warfarin. 50% of time was spent on fingerprick POC INR sample collection,processing, results determination, and documentation in http://www.kim.net/.

## 2020-01-02 ENCOUNTER — Telehealth: Payer: Self-pay | Admitting: Pharmacist

## 2020-01-02 MED ORDER — ENOXAPARIN SODIUM 80 MG/0.8ML ~~LOC~~ SOLN: 80.0000 mg | Freq: Two times a day (BID) | SUBCUTANEOUS | 1 refills | Status: DC

## 2020-01-02 NOTE — Telephone Encounter (Signed)
Apprised patient of sending a LMWH enoxaparin prescription for her to [facility]. Copy of instructions are to be scanned into media section of [facility] on Wednesday 30-JUN-21.

## 2020-01-08 ENCOUNTER — Telehealth: Payer: Self-pay | Admitting: Pharmacist

## 2020-01-08 NOTE — Telephone Encounter (Signed)
Patient called for clarification of her post-op warfarin instructions. Patient is to take FOUR (4) MILLIGRAMS of warfarin (one [1] tablet) by mouth daily post-discharg WITH the LMWH as previouisly described on instructions provided at last visit. She will continue this until seen on the first Monday post-surgery/post-discharge.

## 2020-01-09 ENCOUNTER — Other Ambulatory Visit: Payer: Self-pay | Admitting: Pharmacist

## 2020-01-16 ENCOUNTER — Other Ambulatory Visit: Payer: Self-pay

## 2020-01-16 ENCOUNTER — Encounter (HOSPITAL_COMMUNITY): Payer: Self-pay | Admitting: Urology

## 2020-01-16 ENCOUNTER — Inpatient Hospital Stay (HOSPITAL_COMMUNITY)
Admission: RE | Admit: 2020-01-16 | Discharge: 2020-01-24 | DRG: 654 | Disposition: A | Discharge status: Home Health | Payer: Medicare Other | Source: Ambulatory Visit | Attending: Urology | Admitting: Urology

## 2020-01-16 DIAGNOSIS — I129 Hypertensive chronic kidney disease with stage 1 through stage 4 chronic kidney disease, or unspecified chronic kidney disease: Secondary | ICD-10-CM | POA: Diagnosis present

## 2020-01-16 DIAGNOSIS — Z87891 Personal history of nicotine dependence: Secondary | ICD-10-CM | POA: Diagnosis not present

## 2020-01-16 DIAGNOSIS — E785 Hyperlipidemia, unspecified: Secondary | ICD-10-CM | POA: Diagnosis present

## 2020-01-16 DIAGNOSIS — Z79890 Hormone replacement therapy (postmenopausal): Secondary | ICD-10-CM

## 2020-01-16 DIAGNOSIS — C679 Malignant neoplasm of bladder, unspecified: Secondary | ICD-10-CM | POA: Diagnosis present

## 2020-01-16 DIAGNOSIS — Z6830 Body mass index (BMI) 30.0-30.9, adult: Secondary | ICD-10-CM | POA: Diagnosis not present

## 2020-01-16 DIAGNOSIS — Z9221 Personal history of antineoplastic chemotherapy: Secondary | ICD-10-CM | POA: Diagnosis not present

## 2020-01-16 DIAGNOSIS — Z801 Family history of malignant neoplasm of trachea, bronchus and lung: Secondary | ICD-10-CM

## 2020-01-16 DIAGNOSIS — E669 Obesity, unspecified: Secondary | ICD-10-CM | POA: Diagnosis present

## 2020-01-16 DIAGNOSIS — Z79899 Other long term (current) drug therapy: Secondary | ICD-10-CM

## 2020-01-16 DIAGNOSIS — Z20822 Contact with and (suspected) exposure to covid-19: Secondary | ICD-10-CM | POA: Diagnosis present

## 2020-01-16 DIAGNOSIS — Z7901 Long term (current) use of anticoagulants: Secondary | ICD-10-CM

## 2020-01-16 DIAGNOSIS — E039 Hypothyroidism, unspecified: Secondary | ICD-10-CM | POA: Diagnosis present

## 2020-01-16 DIAGNOSIS — D6851 Activated protein C resistance: Secondary | ICD-10-CM | POA: Diagnosis present

## 2020-01-16 DIAGNOSIS — N1832 Chronic kidney disease, stage 3b: Secondary | ICD-10-CM | POA: Diagnosis present

## 2020-01-16 DIAGNOSIS — Z809 Family history of malignant neoplasm, unspecified: Secondary | ICD-10-CM | POA: Diagnosis not present

## 2020-01-16 DIAGNOSIS — K660 Peritoneal adhesions (postprocedural) (postinfection): Secondary | ICD-10-CM | POA: Diagnosis present

## 2020-01-16 DIAGNOSIS — Z7902 Long term (current) use of antithrombotics/antiplatelets: Secondary | ICD-10-CM | POA: Diagnosis not present

## 2020-01-16 LAB — CBC
HCT: 31.1 % — ABNORMAL LOW (ref 36.0–46.0)
Hemoglobin: 10.3 g/dL — ABNORMAL LOW (ref 12.0–15.0)
MCH: 36.3 pg — ABNORMAL HIGH (ref 26.0–34.0)
MCHC: 33.1 g/dL (ref 30.0–36.0)
MCV: 109.5 fL — ABNORMAL HIGH (ref 80.0–100.0)
Platelets: 230 10*3/uL (ref 150–400)
RBC: 2.84 MIL/uL — ABNORMAL LOW (ref 3.87–5.11)
RDW: 12.7 % (ref 11.5–15.5)
WBC: 7.3 10*3/uL (ref 4.0–10.5)
nRBC: 0 % (ref 0.0–0.2)

## 2020-01-16 LAB — SURGICAL PCR SCREEN
MRSA, PCR: NEGATIVE
Staphylococcus aureus: NEGATIVE

## 2020-01-16 LAB — COMPREHENSIVE METABOLIC PANEL
ALT: 12 U/L (ref 0–44)
AST: 21 U/L (ref 15–41)
Albumin: 4.4 g/dL (ref 3.5–5.0)
Alkaline Phosphatase: 49 U/L (ref 38–126)
Anion gap: 12 (ref 5–15)
BUN: 18 mg/dL (ref 8–23)
CO2: 27 mmol/L (ref 22–32)
Calcium: 9.1 mg/dL (ref 8.9–10.3)
Chloride: 99 mmol/L (ref 98–111)
Creatinine, Ser: 1.46 mg/dL — ABNORMAL HIGH (ref 0.44–1.00)
GFR calc Af Amer: 40 mL/min — ABNORMAL LOW (ref >60)
GFR calc non Af Amer: 35 mL/min — ABNORMAL LOW (ref >60)
Glucose, Bld: 105 mg/dL — ABNORMAL HIGH (ref 70–99)
Potassium: 4.4 mmol/L (ref 3.5–5.1)
Sodium: 138 mmol/L (ref 135–145)
Total Bilirubin: 1 mg/dL (ref 0.3–1.2)
Total Protein: 7.6 g/dL (ref 6.5–8.1)

## 2020-01-16 LAB — SARS CORONAVIRUS 2 BY RT PCR (HOSPITAL ORDER, PERFORMED IN ~~LOC~~ HOSPITAL LAB): SARS Coronavirus 2: NEGATIVE

## 2020-01-16 LAB — PREPARE RBC (CROSSMATCH)

## 2020-01-16 MED ORDER — PANTOPRAZOLE SODIUM 40 MG PO TBEC
40.0000 mg | DELAYED_RELEASE_TABLET | Freq: Every day | ORAL | Status: DC
  Administered 2020-01-16 – 2020-01-24 (×8): 40 mg via ORAL
  Filled 2020-01-16 (×8): qty 1

## 2020-01-16 MED ORDER — PIPERACILLIN-TAZOBACTAM 3.375 G IVPB 30 MIN
3.3750 g | INTRAVENOUS | Status: AC
  Administered 2020-01-17: 3.375 g via INTRAVENOUS
  Filled 2020-01-16: qty 50

## 2020-01-16 MED ORDER — LEVOTHYROXINE SODIUM 88 MCG PO TABS
88.0000 ug | ORAL_TABLET | Freq: Every day | ORAL | Status: DC
  Administered 2020-01-17 – 2020-01-24 (×8): 88 ug via ORAL
  Filled 2020-01-16 (×8): qty 1

## 2020-01-16 MED ORDER — SODIUM CHLORIDE 0.9% IV SOLUTION: Freq: Once | INTRAVENOUS | Status: DC

## 2020-01-16 MED ORDER — SODIUM CHLORIDE 0.9% FLUSH: 10.0000 mL | INTRAVENOUS | Status: DC | PRN

## 2020-01-16 MED ORDER — MUPIROCIN 2 % EX OINT
1.0000 "application " | TOPICAL_OINTMENT | Freq: Two times a day (BID) | CUTANEOUS | Status: AC
  Administered 2020-01-16 – 2020-01-21 (×9): 1 via NASAL
  Filled 2020-01-16 (×3): qty 22

## 2020-01-16 MED ORDER — PEG 3350-KCL-NA BICARB-NACL 420 G PO SOLR
4000.0000 mL | Freq: Once | ORAL | Status: AC
  Administered 2020-01-16: 4000 mL via ORAL

## 2020-01-16 MED ORDER — CHLORHEXIDINE GLUCONATE CLOTH 2 % EX PADS
6.0000 | MEDICATED_PAD | Freq: Every day | CUTANEOUS | Status: DC
  Administered 2020-01-18 – 2020-01-24 (×6): 6 via TOPICAL

## 2020-01-16 NOTE — H&P (Signed)
Pamela [name] is an 74 y.o. female.    Chief Complaint: Pre-Op Robotic Cystectomy / Hysterectomy / Node Dissection  HPI:     1 - Muscle Invasive / Aggressive Variant Bladder Cancer - T2G3 urothelial cancer by TURBT 08/2019 with small cell differentiation. CT clinically localized. Receieved neoadjuvant gem-cis (plan 3 cycles) by [facility]. Restaging CT abd/pelvis 12/2019 w/o locally advanced or distant disease. Cr 1.2. Single ureters bilat.   PMH sig for obestiy, chole, Rt ovarian cyst removal, Coumading for F2 mutation (DVT x 3). 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 Her PCP is Larey Dresser MD.   Today " [place]" is seen prior to planned curative intent cystectomy / node dissection / TAH/BSO tomorrow. Restaging imagign and labs favorable. has insructions for Lovenox Bridge 40mg  BID stop eve prior to OR, then restart hopefully POD 2. pending hemostasis.      Past Medical History:  Diagnosis Date  . bladder ca dx'd 09/05/2019  . CHOLELITHIASIS, WITH OBSTRUCTION 04/21/2006   s/p ERCP,sprincterotomy, stent ([facility])  . Complication of anesthesia   . 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 GLAUCOMA 04/24/2008  . HYPERLIPIDEMIA 06/03/2006  . Hypertension   . Hypothyroidism lifelong  . OBESITY, MILD 04/24/2008  . PONV (postoperative nausea and vomiting)   . Tenosynovitis 01/2004   Sypher    Past Surgical History:  Procedure Laterality Date  . ANKLE ARTHROPLASTY Right 2008  . CHOLECYSTECTOMY     2004  . HAND CONTRACTURE RELEASE Left 1993  . IR IMAGING GUIDED PORT INSERTION  09/20/2019  . KNEE ARTHROSCOPY Right 2004  . OVARY SURGERY Right 1998  . TRANSURETHRAL RESECTION OF BLADDER TUMOR WITH MITOMYCIN-C N/A 08/28/2019   Procedure: TRANSURETHRAL RESECTION OF BLADDER TUMOR;  Surgeon: Lucas Mallow, MD;  Location: [facility];  Service: Urology;  Laterality: N/A;    Family History  Problem Relation Age  of Onset  . Cancer Mother        H&N, smoker  . Liver disease Mother   . Cancer Sister        lung, 2011, smoker  . Other Other        grandmother had mastectomy in her 75's unknown reason  . Macular degeneration Father   . Diverticulitis Father   . Breast cancer Neg Hx    Social History:  reports that she quit smoking about 34 years ago. She has a 20.00 pack-year smoking history. She has quit using smokeless tobacco. She reports current alcohol use. She reports that she does not use drugs.  Allergies:  Allergies  Allergen Reactions  . Ace Inhibitors Cough    Medications Prior to Admission  Medication Sig Dispense Refill  . acetaminophen (TYLENOL) 325 MG tablet Take 325-650 mg by mouth daily as needed for moderate pain.    . colestipol (COLESTID) 1 g tablet TAKE 10 TABLETS BY MOUTH EVERY DAY (Patient taking differently: Take 6 g by mouth daily at 12 noon. ) 900 tablet 3  . HYDROcodone-acetaminophen (NORCO) 5-325 MG tablet Take 1 tablet by mouth every 4 (four) hours as needed for moderate pain. 6 tablet 0  . levothyroxine (SYNTHROID) 88 MCG tablet TAKE ONE (1) TABLET BY MOUTH EVERY DAY (Patient taking differently: Take 88 mcg by mouth daily before breakfast. ) 90 tablet 3  . lidocaine-prilocaine (EMLA) cream Apply 1 application topically as needed. (Patient taking differently: Apply 1 application topically daily as needed (port  access). ) 30 g 0  . losartan (COZAAR) 25 MG tablet TAKE 1 TABLET BY MOUTH ONCE DAILY (Patient taking differently: Take 25 mg by mouth daily. ) 90 tablet 3  . magnesium oxide (MAG-OX) 400 MG tablet Take 400 mg by mouth daily.    . pantoprazole (PROTONIX) 40 MG tablet TAKE 1 TABLET BY MOUTH ONCE DAILY (Patient taking differently: Take 40 mg by mouth daily. ) 90 tablet 3  . prochlorperazine (COMPAZINE) 10 MG tablet Take 1 tablet (10 mg total) by mouth every 6 (six) hours as needed for nausea or vomiting. 30 tablet 0  . Ascorbic Acid (VITAMIN C PO) Take 1 tablet by  mouth every evening.    [name] CALCIUM PO Take 1 tablet by mouth daily.     . Cyanocobalamin (B-12) 2500 MCG TABS Take 2,500 mcg by mouth daily.    [name] enoxaparin (LOVENOX) 80 MG/0.8ML injection Inject 0.8 mLs (80 mg total) into the skin every 12 (twelve) hours. Use instructions provided by [name], CPP. 10 mL 1  . Multiple Vitamin (MULTIVITAMIN WITH MINERALS) TABS tablet Take 1 tablet by mouth daily in the afternoon.    [name] VITAMIN D PO Take 1 capsule by mouth every evening.    . warfarin (COUMADIN) 4 MG tablet TAKE ONE TABLET BY MOUTH ON MONDAYS AND FRIDAYS AND TAKE ONE-HALF TABLET ALL OTHER DAYS 65 tablet 10    No results found for this or any previous visit (from the past 48 hour(s)). No results found.  Review of Systems  Constitutional: Negative for chills and fever.  All other systems reviewed and are negative.   Blood pressure (!) 152/90, pulse 90, temperature 98.5 F (36.9 C), resp. rate 14, SpO2 97 %. Physical Exam Vitals reviewed.  HENT:     Head: Normocephalic.     Nose: Nose normal.  Eyes:     Pupils: Pupils are equal, round, and reactive to light.  Cardiovascular:     Rate and Rhythm: Normal rate.  Pulmonary:     Effort: Pulmonary effort is normal.  Abdominal:     Comments: Stable moderate obesity. RLQ Urostomy marking site noted.   Genitourinary:    Comments: NO CVAT Musculoskeletal:        General: Normal range of motion.     Cervical back: Normal range of motion.  Skin:    General: Skin is warm.  Neurological:     General: No focal deficit present.     Mental Status: She is alert.  Psychiatric:        Mood and Affect: Mood normal.      Assessment/Plan   Proceed as planned wit  Robotic cystectomy / TAH / node dissection, cysto-ICG tomorrow with curative intent. Risks, benefits, alternatives, expected peri-op course discussed again detail including about 7 day hospital stay and home with home healthy.   CMP, CBC, T+C, Entereg, NS, Bowel prep, Hold lovenox  tonight.   Alexis Frock, MD 01/16/2020, 4:41 PM

## 2020-01-16 NOTE — Anesthesia Preprocedure Evaluation (Addendum)
Anesthesia Evaluation  Patient identified by MRN, date of birth, ID band Patient awake    Reviewed: Allergy & Precautions, NPO status , Patient's Chart, lab work & pertinent test results, reviewed documented beta blocker date and time   History of Anesthesia Complications (+) PONV and history of anesthetic complications  Airway Mallampati: II  TM Distance: >3 FB Neck ROM: Full    Dental no notable dental hx. (+) Teeth Intact, Caps   Pulmonary former smoker,    Pulmonary exam normal breath sounds clear to auscultation       Cardiovascular hypertension, Pt. on medications + DVT  Normal cardiovascular exam Rhythm:Regular Rate:Normal     Neuro/Psych Glaucoma negative psych ROS   GI/Hepatic Neg liver ROS, GERD  Medicated and Controlled,  Endo/Other  Hypothyroidism Hyperlipidemia Obesity Hx/o Gout  Renal/GU Renal InsufficiencyRenal disease   Bladder Ca    Musculoskeletal  (+) Arthritis , Osteoarthritis,  Osteopenia   Abdominal (+) + obese,   Peds  Hematology  (+) Blood dyscrasia, anemia , Factor II gene mutation- hx/o DVT in setting of OCP and tobacco use Chronic anticoagulation- on Coumadin- last dose Lovenox bridge   Anesthesia Other Findings   Reproductive/Obstetrics                           Anesthesia Physical Anesthesia Plan  ASA: II  Anesthesia Plan: General   Post-op Pain Management:    Induction: Intravenous  PONV Risk Score and Plan: Ondansetron, Dexamethasone, Diphenhydramine and Treatment may vary due to age or medical condition  Airway Management Planned: Oral ETT  Additional Equipment: Arterial line  Intra-op Plan:   Post-operative Plan: Extubation in OR  Informed Consent: I have reviewed the patients History and Physical, chart, labs and discussed the procedure including the risks, benefits and alternatives for the proposed anesthesia with the patient or  authorized representative who has indicated his/her understanding and acceptance.     Dental advisory given  Plan Discussed with: CRNA and Surgeon  Anesthesia Plan Comments: (2 large bore IV's)       Anesthesia Quick Evaluation

## 2020-01-16 NOTE — Consult Note (Signed)
[place] Nurse ostomy consult note  [facility] Nurse requested for preoperative stoma site marking by Dr. Clint Lipps.  Discussed surgical procedure and stoma creation with patient and daughter.  Explained role of the [place] nurse team.  Answered patient and family member's questions.   Examined patient sitting and standing in order to place the marking in the patient's visual field, away from any creases or abdominal contour issues and within the rectus muscle.   The abdomen has a gravitational    falls upon standing, so the high mark noted while sitting is at pant line when standing.  Marked for ileal conduit in the RUQ, 4.5cm to the right of the umbilicus and  7.0VX above the umbilicus.   Note: the patient can see this mark both sitting and standing and it would be a suitable position for self care.  Patient's abdomen cleansed with CHG wipes at site marking, allowed to air dry prior to marking. Covered mark with thin film transparent dressing to preserve mark until date of surgery (tomorrow, 01/17/20).   [facility] Nurse team will follow up with patient after surgery for continue ostomy care and teaching.   Thank you for this consultation and for allowing [place] to meet and mark this nice patient.  Maudie Flakes, MSN, RN, [place], Arther Abbott  Pager# [phone]

## 2020-01-17 ENCOUNTER — Inpatient Hospital Stay (HOSPITAL_COMMUNITY): Payer: Medicare Other | Admitting: Anesthesiology

## 2020-01-17 ENCOUNTER — Encounter (HOSPITAL_COMMUNITY): Payer: Self-pay | Admitting: Urology

## 2020-01-17 ENCOUNTER — Encounter (HOSPITAL_COMMUNITY)
Admission: RE | Disposition: A | Discharge status: Home Health | Payer: Self-pay | Source: Ambulatory Visit | Attending: Urology

## 2020-01-17 HISTORY — PX: CYSTOSCOPY WITH INJECTION: SHX1424

## 2020-01-17 HISTORY — PX: ROBOT ASSISTED LAPAROSCOPIC COMPLETE CYSTECT ILEAL CONDUIT: SHX5139

## 2020-01-17 LAB — HEMOGLOBIN AND HEMATOCRIT, BLOOD
HCT: 24.5 % — ABNORMAL LOW (ref 36.0–46.0)
Hemoglobin: 7.8 g/dL — ABNORMAL LOW (ref 12.0–15.0)

## 2020-01-17 LAB — MRSA PCR SCREENING: MRSA by PCR: NEGATIVE

## 2020-01-17 LAB — PREPARE RBC (CROSSMATCH)

## 2020-01-17 SURGERY — CYSTECTOMY, ROBOT-ASSISTED, WITH ILEAL CONDUIT CREATION
Anesthesia: General

## 2020-01-17 MED ORDER — SODIUM CHLORIDE (PF) 0.9 % IJ SOLN
INTRAMUSCULAR | Status: AC
  Filled 2020-01-17: qty 20

## 2020-01-17 MED ORDER — PROPOFOL 10 MG/ML IV BOLUS
INTRAVENOUS | Status: DC | PRN
  Administered 2020-01-17: 100 mg via INTRAVENOUS

## 2020-01-17 MED ORDER — SODIUM CHLORIDE 0.9% IV SOLUTION: Freq: Once | INTRAVENOUS | Status: DC

## 2020-01-17 MED ORDER — GLYCOPYRROLATE PF 0.2 MG/ML IJ SOSY
PREFILLED_SYRINGE | INTRAMUSCULAR | Status: AC
  Filled 2020-01-17: qty 1

## 2020-01-17 MED ORDER — LACTATED RINGERS IV SOLN: INTRAVENOUS | Status: DC

## 2020-01-17 MED ORDER — FENTANYL CITRATE (PF) 100 MCG/2ML IJ SOLN: 25.0000 ug | INTRAMUSCULAR | Status: DC | PRN

## 2020-01-17 MED ORDER — FENTANYL CITRATE (PF) 100 MCG/2ML IJ SOLN
INTRAMUSCULAR | Status: AC
  Administered 2020-01-17: 25 ug via INTRAVENOUS
  Filled 2020-01-17: qty 2

## 2020-01-17 MED ORDER — OXYCODONE HCL 5 MG PO TABS
5.0000 mg | ORAL_TABLET | ORAL | Status: DC | PRN
  Administered 2020-01-21 – 2020-01-23 (×3): 5 mg via ORAL
  Filled 2020-01-17 (×4): qty 1

## 2020-01-17 MED ORDER — SUGAMMADEX SODIUM 200 MG/2ML IV SOLN
INTRAVENOUS | Status: DC | PRN
Start: 2020-01-17 — End: 2020-01-17
  Administered 2020-01-17: 200 mg via INTRAVENOUS

## 2020-01-17 MED ORDER — ONDANSETRON HCL 4 MG/2ML IJ SOLN
4.0000 mg | INTRAMUSCULAR | Status: DC | PRN
  Administered 2020-01-19 (×2): 4 mg via INTRAVENOUS
  Filled 2020-01-17 (×2): qty 2

## 2020-01-17 MED ORDER — ROCURONIUM BROMIDE 100 MG/10ML IV SOLN
INTRAVENOUS | Status: DC | PRN
  Administered 2020-01-17 (×2): 20 mg via INTRAVENOUS
  Administered 2020-01-17: 60 mg via INTRAVENOUS
  Administered 2020-01-17 (×2): 20 mg via INTRAVENOUS
  Administered 2020-01-17: 10 mg via INTRAVENOUS
  Administered 2020-01-17 (×2): 20 mg via INTRAVENOUS

## 2020-01-17 MED ORDER — KETAMINE HCL 10 MG/ML IJ SOLN
INTRAMUSCULAR | Status: AC
  Filled 2020-01-17: qty 1

## 2020-01-17 MED ORDER — GLYCOPYRROLATE 0.2 MG/ML IJ SOLN
INTRAMUSCULAR | Status: DC | PRN
  Administered 2020-01-17: .2 mg via INTRAVENOUS

## 2020-01-17 MED ORDER — FENTANYL CITRATE (PF) 100 MCG/2ML IJ SOLN
INTRAMUSCULAR | Status: AC
  Filled 2020-01-17: qty 2

## 2020-01-17 MED ORDER — FENTANYL CITRATE (PF) 250 MCG/5ML IJ SOLN
INTRAMUSCULAR | Status: AC
  Filled 2020-01-17: qty 5

## 2020-01-17 MED ORDER — ALVIMOPAN 12 MG PO CAPS
12.0000 mg | ORAL_CAPSULE | Freq: Two times a day (BID) | ORAL | Status: DC
  Administered 2020-01-18 – 2020-01-21 (×8): 12 mg via ORAL
  Filled 2020-01-17 (×9): qty 1

## 2020-01-17 MED ORDER — LIDOCAINE 2% (20 MG/ML) 5 ML SYRINGE
INTRAMUSCULAR | Status: AC
  Filled 2020-01-17: qty 5

## 2020-01-17 MED ORDER — PHENYLEPHRINE HCL-NACL 10-0.9 MG/250ML-% IV SOLN
INTRAVENOUS | Status: DC | PRN
  Administered 2020-01-17: 15 ug/min via INTRAVENOUS

## 2020-01-17 MED ORDER — ONDANSETRON HCL 4 MG/2ML IJ SOLN
INTRAMUSCULAR | Status: DC | PRN
  Administered 2020-01-17: 4 mg via INTRAVENOUS

## 2020-01-17 MED ORDER — ORAL CARE MOUTH RINSE
15.0000 mL | Freq: Two times a day (BID) | OROMUCOSAL | Status: DC
  Administered 2020-01-17 – 2020-01-24 (×10): 15 mL via OROMUCOSAL

## 2020-01-17 MED ORDER — DEXMEDETOMIDINE HCL IN NACL 200 MCG/50ML IV SOLN
INTRAVENOUS | Status: DC | PRN
  Administered 2020-01-17 (×2): 8 ug via INTRAVENOUS

## 2020-01-17 MED ORDER — ALVIMOPAN 12 MG PO CAPS
ORAL_CAPSULE | ORAL | Status: AC
  Filled 2020-01-17: qty 1

## 2020-01-17 MED ORDER — DIPHENHYDRAMINE HCL 12.5 MG/5ML PO ELIX: 12.5000 mg | ORAL_SOLUTION | Freq: Four times a day (QID) | ORAL | Status: DC | PRN

## 2020-01-17 MED ORDER — MIDAZOLAM HCL 2 MG/2ML IJ SOLN
INTRAMUSCULAR | Status: AC
  Filled 2020-01-17: qty 2

## 2020-01-17 MED ORDER — MIDAZOLAM HCL 5 MG/5ML IJ SOLN
INTRAMUSCULAR | Status: DC | PRN
  Administered 2020-01-17 (×2): 1 mg via INTRAVENOUS

## 2020-01-17 MED ORDER — DEXTROSE-NACL 5-0.45 % IV SOLN: INTRAVENOUS | Status: DC

## 2020-01-17 MED ORDER — ROCURONIUM BROMIDE 10 MG/ML (PF) SYRINGE
PREFILLED_SYRINGE | INTRAVENOUS | Status: AC
  Filled 2020-01-17: qty 10

## 2020-01-17 MED ORDER — SENNOSIDES-DOCUSATE SODIUM 8.6-50 MG PO TABS
2.0000 | ORAL_TABLET | Freq: Every day | ORAL | Status: DC
  Administered 2020-01-19 – 2020-01-21 (×3): 2 via ORAL
  Filled 2020-01-17 (×5): qty 2

## 2020-01-17 MED ORDER — BUPIVACAINE LIPOSOME 1.3 % IJ SUSP
20.0000 mL | Freq: Once | INTRAMUSCULAR | Status: AC
  Administered 2020-01-17: 20 mL
  Filled 2020-01-17: qty 20

## 2020-01-17 MED ORDER — DEXAMETHASONE SODIUM PHOSPHATE 10 MG/ML IJ SOLN
INTRAMUSCULAR | Status: AC
  Filled 2020-01-17: qty 1

## 2020-01-17 MED ORDER — PIPERACILLIN-TAZOBACTAM 3.375 G IVPB 30 MIN
3.3750 g | Freq: Three times a day (TID) | INTRAVENOUS | Status: AC
  Administered 2020-01-17 – 2020-01-19 (×6): 3.375 g via INTRAVENOUS
  Filled 2020-01-17 (×8): qty 50

## 2020-01-17 MED ORDER — DIPHENHYDRAMINE HCL 50 MG/ML IJ SOLN
INTRAMUSCULAR | Status: DC | PRN
  Administered 2020-01-17: 12.5 mg via INTRAVENOUS

## 2020-01-17 MED ORDER — HYDROMORPHONE HCL 1 MG/ML IJ SOLN
0.5000 mg | INTRAMUSCULAR | Status: DC | PRN
  Administered 2020-01-17 – 2020-01-23 (×11): 1 mg via INTRAVENOUS
  Filled 2020-01-17 (×11): qty 1

## 2020-01-17 MED ORDER — DEXMEDETOMIDINE HCL IN NACL 200 MCG/50ML IV SOLN
INTRAVENOUS | Status: AC
  Filled 2020-01-17: qty 50

## 2020-01-17 MED ORDER — DROPERIDOL 2.5 MG/ML IJ SOLN: 0.6250 mg | Freq: Once | INTRAMUSCULAR | Status: DC | PRN

## 2020-01-17 MED ORDER — DIPHENHYDRAMINE HCL 50 MG/ML IJ SOLN
INTRAMUSCULAR | Status: AC
  Filled 2020-01-17: qty 1

## 2020-01-17 MED ORDER — ALBUMIN HUMAN 5 % IV SOLN: INTRAVENOUS | Status: DC | PRN

## 2020-01-17 MED ORDER — ONDANSETRON HCL 4 MG/2ML IJ SOLN: 4.0000 mg | Freq: Once | INTRAMUSCULAR | Status: DC | PRN

## 2020-01-17 MED ORDER — DEXMEDETOMIDINE HCL IN NACL 200 MCG/50ML IV SOLN
INTRAVENOUS | Status: DC | PRN
  Administered 2020-01-17: .2 ug/kg/h via INTRAVENOUS

## 2020-01-17 MED ORDER — PROPOFOL 10 MG/ML IV BOLUS
INTRAVENOUS | Status: AC
  Filled 2020-01-17: qty 20

## 2020-01-17 MED ORDER — FENTANYL CITRATE (PF) 250 MCG/5ML IJ SOLN
INTRAMUSCULAR | Status: DC | PRN
  Administered 2020-01-17 (×2): 50 ug via INTRAVENOUS
  Administered 2020-01-17: 75 ug via INTRAVENOUS
  Administered 2020-01-17: 50 ug via INTRAVENOUS
  Administered 2020-01-17: 25 ug via INTRAVENOUS
  Administered 2020-01-17 (×2): 50 ug via INTRAVENOUS

## 2020-01-17 MED ORDER — SODIUM CHLORIDE 0.9 % IR SOLN
Status: DC | PRN
  Administered 2020-01-17: 3000 mL via INTRAVESICAL

## 2020-01-17 MED ORDER — ONDANSETRON HCL 4 MG/2ML IJ SOLN
INTRAMUSCULAR | Status: AC
  Filled 2020-01-17: qty 2

## 2020-01-17 MED ORDER — DIPHENHYDRAMINE HCL 50 MG/ML IJ SOLN: 12.5000 mg | Freq: Four times a day (QID) | INTRAMUSCULAR | Status: DC | PRN

## 2020-01-17 MED ORDER — KETAMINE HCL 10 MG/ML IJ SOLN
INTRAMUSCULAR | Status: DC | PRN
Start: 2020-01-17 — End: 2020-01-17
  Administered 2020-01-17: 10 mg via INTRAVENOUS
  Administered 2020-01-17: 40 mg via INTRAVENOUS

## 2020-01-17 MED ORDER — SODIUM CHLORIDE (PF) 0.9 % IJ SOLN
INTRAMUSCULAR | Status: DC | PRN
  Administered 2020-01-17: 20 mL

## 2020-01-17 MED ORDER — LIDOCAINE HCL (CARDIAC) PF 100 MG/5ML IV SOSY
PREFILLED_SYRINGE | INTRAVENOUS | Status: DC | PRN
  Administered 2020-01-17: 60 mg via INTRAVENOUS

## 2020-01-17 MED ORDER — PHENYLEPHRINE HCL (PRESSORS) 10 MG/ML IV SOLN
INTRAVENOUS | Status: AC
  Filled 2020-01-17: qty 1

## 2020-01-17 MED ORDER — WATER FOR IRRIGATION, STERILE IR SOLN
Status: DC | PRN
  Administered 2020-01-17: 1000 mL

## 2020-01-17 MED ORDER — LACTATED RINGERS IR SOLN
Status: DC | PRN
  Administered 2020-01-17: 1

## 2020-01-17 MED ORDER — DEXAMETHASONE SODIUM PHOSPHATE 10 MG/ML IJ SOLN
INTRAMUSCULAR | Status: DC | PRN
  Administered 2020-01-17: 10 mg via INTRAVENOUS

## 2020-01-17 MED ORDER — ACETAMINOPHEN 500 MG PO TABS
1000.0000 mg | ORAL_TABLET | Freq: Four times a day (QID) | ORAL | Status: AC
  Administered 2020-01-17 – 2020-01-18 (×3): 1000 mg via ORAL
  Filled 2020-01-17 (×3): qty 2

## 2020-01-17 MED ORDER — ALBUMIN HUMAN 5 % IV SOLN
INTRAVENOUS | Status: AC
  Filled 2020-01-17: qty 250

## 2020-01-17 SURGICAL SUPPLY — 114 items
ADH SKN CLS APL DERMABOND .7 (GAUZE/BANDAGES/DRESSINGS) ×1
AGENT HMST KT MTR STRL THRMB (HEMOSTASIS)
APL ESCP 34 STRL LF DISP (HEMOSTASIS)
APL PRP STRL LF DISP 70% ISPRP (MISCELLANEOUS) ×1
APL SKNCLS STERI-STRIP NONHPOA (GAUZE/BANDAGES/DRESSINGS) ×1
APL SWBSTK 6 STRL LF DISP (MISCELLANEOUS) ×1
APPLICATOR COTTON TIP 6 STRL (MISCELLANEOUS) ×1 IMPLANT
APPLICATOR COTTON TIP 6IN STRL (MISCELLANEOUS) ×2
APPLICATOR SURGIFLO ENDO (HEMOSTASIS) IMPLANT
BAG LAPAROSCOPIC 12 15 PORT 16 (BASKET) ×1 IMPLANT
BAG RETRIEVAL 12/15 (BASKET) ×2
BAG URO CATCHER STRL LF (MISCELLANEOUS) ×2 IMPLANT
BENZOIN TINCTURE PRP APPL 2/3 (GAUZE/BANDAGES/DRESSINGS) ×1 IMPLANT
BLADE SURG SZ10 CARB STEEL (BLADE) IMPLANT
CATH SILICONE 5CC 18FR (INSTRUMENTS) ×2 IMPLANT
CELLS DAT CNTRL 66122 CELL SVR (MISCELLANEOUS) ×1 IMPLANT
CHLORAPREP W/TINT 26 (MISCELLANEOUS) ×2 IMPLANT
CLIP VESOLOCK LG 6/CT PURPLE (CLIP) ×4 IMPLANT
CLIP VESOLOCK MED LG 6/CT (CLIP) ×2 IMPLANT
CLIP VESOLOCK XL 6/CT (CLIP) ×5 IMPLANT
CLOTH BEACON ORANGE TIMEOUT ST (SAFETY) ×2 IMPLANT
CNTNR URN SCR LID CUP LEK RST (MISCELLANEOUS) ×1 IMPLANT
CONT SPEC 4OZ STRL OR WHT (MISCELLANEOUS) ×2
COVER MAYO STAND STRL (DRAPES) ×2 IMPLANT
COVER SURGICAL LIGHT HANDLE (MISCELLANEOUS) ×2 IMPLANT
COVER TIP SHEARS 8 DVNC (MISCELLANEOUS) ×1 IMPLANT
COVER TIP SHEARS 8MM DA VINCI (MISCELLANEOUS) ×2
COVER WAND RF STERILE (DRAPES) IMPLANT
DECANTER SPIKE VIAL GLASS SM (MISCELLANEOUS) ×2 IMPLANT
DERMABOND ADVANCED (GAUZE/BANDAGES/DRESSINGS) ×1
DERMABOND ADVANCED .7 DNX12 (GAUZE/BANDAGES/DRESSINGS) ×2 IMPLANT
DRAIN CHANNEL RND F F (WOUND CARE) IMPLANT
DRAIN PENROSE 0.5X18 (DRAIN) IMPLANT
DRAPE ARM DVNC X/XI (DISPOSABLE) ×4 IMPLANT
DRAPE COLUMN DVNC XI (DISPOSABLE) ×1 IMPLANT
DRAPE DA VINCI XI ARM (DISPOSABLE) ×8
DRAPE DA VINCI XI COLUMN (DISPOSABLE) ×2
DRSG TEGADERM 4X4.75 (GAUZE/BANDAGES/DRESSINGS) ×1 IMPLANT
ELECT REM PT RETURN 15FT ADLT (MISCELLANEOUS) ×2 IMPLANT
GAUZE 4X4 16PLY RFD (DISPOSABLE) IMPLANT
GAUZE SPONGE 2X2 8PLY STRL LF (GAUZE/BANDAGES/DRESSINGS) IMPLANT
GLOVE BIO SURGEON STRL SZ 6.5 (GLOVE) ×4 IMPLANT
GLOVE BIOGEL M STRL SZ7.5 (GLOVE) ×6 IMPLANT
GLOVE BIOGEL PI IND STRL 7.5 (GLOVE) ×1 IMPLANT
GLOVE BIOGEL PI INDICATOR 7.5 (GLOVE) ×1
GOWN STRL REUS W/TWL LRG LVL3 (GOWN DISPOSABLE) ×10 IMPLANT
IRRIG SUCT STRYKERFLOW 2 WTIP (MISCELLANEOUS) ×2
IRRIGATION SUCT STRKRFLW 2 WTP (MISCELLANEOUS) ×1 IMPLANT
KIT PROCEDURE DA VINCI SI (MISCELLANEOUS)
KIT PROCEDURE DVNC SI (MISCELLANEOUS) IMPLANT
KIT TURNOVER KIT A (KITS) IMPLANT
LOOP VESSEL MAXI BLUE (MISCELLANEOUS) ×2 IMPLANT
MANIFOLD NEPTUNE II (INSTRUMENTS) ×2 IMPLANT
NDL ASPIRATION 22 (NEEDLE) ×1 IMPLANT
NDL INSUFFLATION 14GA 120MM (NEEDLE) ×1 IMPLANT
NEEDLE ASPIRATION 22 (NEEDLE) ×2 IMPLANT
NEEDLE INSUFFLATION 14GA 120MM (NEEDLE) ×2 IMPLANT
PACK CYSTO (CUSTOM PROCEDURE TRAY) ×2 IMPLANT
PACK ROBOT UROLOGY CUSTOM (CUSTOM PROCEDURE TRAY) ×2 IMPLANT
PACKING VAGINAL (PACKING) ×1 IMPLANT
PAD POSITIONING PINK XL (MISCELLANEOUS) ×2 IMPLANT
PORT ACCESS TROCAR AIRSEAL 12 (TROCAR) ×1 IMPLANT
PORT ACCESS TROCAR AIRSEAL 5M (TROCAR) ×1
RELOAD STAPLE 60 2.6 WHT THN (STAPLE) ×3 IMPLANT
RELOAD STAPLE 60 4.1 GRN THCK (STAPLE) ×3 IMPLANT
RELOAD STAPLER GREEN 60MM (STAPLE) ×4 IMPLANT
RELOAD STAPLER WHITE 60MM (STAPLE) ×6 IMPLANT
RETRACTOR LONRSTAR 16.6X16.6CM (MISCELLANEOUS) IMPLANT
RETRACTOR STAY HOOK 5MM (MISCELLANEOUS) IMPLANT
RETRACTOR STER APS 16.6X16.6CM (MISCELLANEOUS)
RETRACTOR WND ALEXIS 18 MED (MISCELLANEOUS) ×1 IMPLANT
RTRCTR WOUND ALEXIS 18CM MED (MISCELLANEOUS) ×2
SEAL CANN UNIV 5-8 DVNC XI (MISCELLANEOUS) ×4 IMPLANT
SEAL XI 5MM-8MM UNIVERSAL (MISCELLANEOUS) ×8
SET IRRIG Y TYPE TUR BLADDER L (SET/KITS/TRAYS/PACK) ×2 IMPLANT
SET TRI-LUMEN FLTR TB AIRSEAL (TUBING) ×2 IMPLANT
SOLUTION ELECTROLUBE (MISCELLANEOUS) ×2 IMPLANT
SPONGE GAUZE 2X2 STER 10/PKG (GAUZE/BANDAGES/DRESSINGS) ×1
SPONGE LAP 18X18 RF (DISPOSABLE) ×5 IMPLANT
SPONGE LAP 4X18 RFD (DISPOSABLE) ×2 IMPLANT
STAPLER ECHELON LONG 60 440 (INSTRUMENTS) ×2 IMPLANT
STAPLER RELOAD GREEN 60MM (STAPLE) ×8
STAPLER RELOAD WHITE 60MM (STAPLE) ×12
STENT SET URETHERAL LEFT 7FR (STENTS) ×2 IMPLANT
STENT SET URETHERAL RIGHT 7FR (STENTS) ×2 IMPLANT
SURGIFLO W/THROMBIN 8M KIT (HEMOSTASIS) IMPLANT
SUT CHROMIC 4 0 RB 1X27 (SUTURE) ×2 IMPLANT
SUT ETHILON 3 0 PS 1 (SUTURE) ×2 IMPLANT
SUT MNCRL AB 4-0 PS2 18 (SUTURE) ×4 IMPLANT
SUT PDS AB 0 CTX 36 PDP370T (SUTURE) ×6 IMPLANT
SUT SILK 3 0 SH 30 (SUTURE) IMPLANT
SUT SILK 3 0 SH CR/8 (SUTURE) ×2 IMPLANT
SUT V-LOC BARB 180 2/0GR6 GS22 (SUTURE) ×4
SUT VIC AB 2-0 CT1 27 (SUTURE) ×2
SUT VIC AB 2-0 CT1 27XBRD (SUTURE) ×1 IMPLANT
SUT VIC AB 2-0 SH 18 (SUTURE) IMPLANT
SUT VIC AB 2-0 UR5 27 (SUTURE) ×8 IMPLANT
SUT VIC AB 3-0 SH 27 (SUTURE) ×12
SUT VIC AB 3-0 SH 27X BRD (SUTURE) ×2 IMPLANT
SUT VIC AB 3-0 SH 27XBRD (SUTURE) ×4 IMPLANT
SUT VIC AB 4-0 RB1 27 (SUTURE) ×8
SUT VIC AB 4-0 RB1 27XBRD (SUTURE) ×4 IMPLANT
SUT VLOC BARB 180 ABS3/0GR12 (SUTURE) ×2
SUTURE V-LC BRB 180 2/0GR6GS22 (SUTURE) IMPLANT
SUTURE VLOC BRB 180 ABS3/0GR12 (SUTURE) ×1 IMPLANT
SYR CONTROL 10ML LL (SYRINGE) IMPLANT
SYSTEM UROSTOMY GENTLE TOUCH (WOUND CARE) ×2 IMPLANT
TOWEL OR NON WOVEN STRL DISP B (DISPOSABLE) ×2 IMPLANT
TROCAR BLADELESS 15MM (ENDOMECHANICALS) ×2 IMPLANT
TROCAR XCEL NON-BLD 5MMX100MML (ENDOMECHANICALS) IMPLANT
TUBING CONNECTING 10 (TUBING) IMPLANT
WATER STERILE IRR 1000ML POUR (IV SOLUTION) ×2 IMPLANT
WATER STERILE IRR 3000ML UROMA (IV SOLUTION) ×2 IMPLANT
YANKAUER SUCT BULB TIP 10FT TU (MISCELLANEOUS) ×2 IMPLANT

## 2020-01-17 NOTE — Progress Notes (Signed)
This RN paged Dr. Tresa Moore twice in regard to hgb of 7.8 with 2 units of PRBCs ordered. Secure chat also sent when page was unanswered. Due to national blood shortage, protocol guides to transfuse for hgb <7. This RN will continue to monitor for signs of bleeding and nightshift RN will reach out to on-call physician.

## 2020-01-17 NOTE — Anesthesia Postprocedure Evaluation (Signed)
Anesthesia Post Note  Patient: Pamela [name]  Procedure(s) Performed: XI ROBOTIC ASSISTED LAPAROSCOPIC COMPLETE CYSTECT ILEAL CONDUIT, XI ROBOTIC ASSISTED LAPAPRSCOPIC HYSTERECTOMY, LEFT OOPHERECTOMY,SALPINGECTOMY;LYMPHADENECTOMY (N/A ) CYSTOSCOPY WITH INJECTION (N/A )     Patient location during evaluation: PACU Anesthesia Type: General Level of consciousness: awake and alert Pain management: pain level controlled Vital Signs Assessment: post-procedure vital signs reviewed and stable Respiratory status: spontaneous breathing, nonlabored ventilation, respiratory function stable and patient connected to nasal cannula oxygen Cardiovascular status: blood pressure returned to baseline and stable Postop Assessment: no apparent nausea or vomiting Anesthetic complications: no   No complications documented.  Last Vitals:  Vitals:   01/17/20 1515 01/17/20 1530  BP: (!) 143/75 (!) 151/78  Pulse: 65 71  Resp: 14 20  Temp:    SpO2: 100% 100%    Last Pain:  Vitals:   01/17/20 1530  TempSrc:   PainSc: 5                  Barnet [place]

## 2020-01-17 NOTE — Brief Op Note (Signed)
01/16/2020 - 01/17/2020  2:46 PM  PATIENT:  Pamela [name]  74 y.o. female  PRE-OPERATIVE DIAGNOSIS:  BLADDER CANCER  POST-OPERATIVE DIAGNOSIS:  BLADDER CANCER  PROCEDURE:  Procedure(s) with comments: XI ROBOTIC ASSISTED LAPAROSCOPIC COMPLETE CYSTECT ILEAL CONDUIT, XI ROBOTIC ASSISTED LAPAPRSCOPIC HYSTERECTOMY, LEFT OOPHERECTOMY,SALPINGECTOMY;LYMPHADENECTOMY (N/A) - 6 HRS CYSTOSCOPY WITH INJECTION (N/A)  SURGEON:  Surgeon(s) and Role:    Alexis Frock, MD - Primary  PHYSICIAN ASSISTANT:   ASSISTANTS: Clemetine Marker PA   ANESTHESIA:   local and general   EBL:  300 mL   BLOOD ADMINISTERED:none  DRAINS: 1 - JP to bulb; 2 - RLQ Urostomy to gravity wtih Rt (red) and Lt (blue) bander stents.    LOCAL MEDICATIONS USED:  NONE  SPECIMEN:  Source of Specimen:  1 - distal ureteral margins; 2 - bladder + uterus/ovaries/urethra/ant vag wall; 3 - pelvic lymp nodes  DISPOSITION OF SPECIMEN:  PATHOLOGY  COUNTS:  YES  TOURNIQUET:  * No tourniquets in log *   DICTATION: .Other Dictation: Dictation Number  [id number]  PLAN OF CARE: Admit to inpatient   PATIENT DISPOSITION:  PACU - hemodynamically stable.   Delay start of Pharmacological VTE agent (>24hrs) due to surgical blood loss or risk of bleeding: yes

## 2020-01-17 NOTE — Progress Notes (Signed)
Per Dr. Tresa Moore, transfuse 2 units PRBCs. This RN will inform nightshift.

## 2020-01-17 NOTE — Progress Notes (Signed)
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 CT clinically localized. Receieved neoadjuvant gem-cis (plan 3 cycles) by [facility]. Restaging CT abd/pelvis 12/2019 w/o locally advanced or distant disease. Cr 1.2. Single ureters bilat.    Today "Pamela [name]" is ready for major extirpative surgery. She completed bowel prep to clear and had stomal marking. Hgb 10.3. Has RBC avail.   Objective: Vital signs in last 24 hours: Temp:  [97.9 F (36.6 C)-98.9 F (37.2 C)] 97.9 F (36.6 C) (07/14 0719) Pulse Rate:  [67-90] 67 (07/14 0719) Resp:  [14-17] 16 (07/14 0719) BP: (152-172)/(79-90) 163/89 (07/14 0719) SpO2:  [97 %-100 %] 100 % (07/14 0719) Weight:  [81.9 kg] 81.9 kg (07/13 2038) Last BM Date: 01/17/20  Intake/Output from previous day: No intake/output data recorded. Intake/Output this shift: No intake/output data recorded.  General appearance: alert, cooperative and very pleasant.  Eyes: negative Nose: Nares normal. Septum midline. Mucosa normal. No drainage or sinus tenderness. Throat: lips, mucosa, and tongue normal; teeth and gums normal Neck: supple, symmetrical, trachea midline Back: symmetric, no curvature. ROM normal. No CVA tenderness. Resp: non-labored on RA Cardio: Nl rate GI: soft, non-tender; bowel sounds normal; no masses,  no organomegaly and RLQ stomal marking site noted.  Extremities: extremities normal, atraumatic, no cyanosis or edema Skin: Skin color, texture, turgor normal. No rashes or lesions Lymph nodes: Cervical, supraclavicular, and axillary nodes normal. Neurologic: Grossly normal  Lab Results:  Recent Labs    01/16/20 1738  WBC 7.3  HGB 10.3*  HCT 31.1*  PLT 230   BMET Recent Labs    01/16/20 1738  NA 138  K 4.4  CL 99  CO2 27  GLUCOSE 105*  BUN 18  CREATININE 1.46*  CALCIUM 9.1   PT/INR No results for  input(s): LABPROT, INR in the last 72 hours. ABG No results for input(s): PHART, HCO3 in the last 72 hours.  Invalid input(s): PCO2, PO2  Studies/Results: No results found.  Anti-infectives: Anti-infectives (From admission, onward)   Start     Dose/Rate Route Frequency Ordered Stop   01/17/20 0745  piperacillin-tazobactam (ZOSYN) IVPB 3.375 g     Discontinue     3.375 g 100 mL/hr over 30 Minutes Intravenous 30 min pre-op 01/16/20 1645        Assessment/Plan:  Proceed as planned with cysto-ICG, robotic cystectomy / TAH / BSO / node dissection / conduit diversion. She has very good understandign of competing risks and expected peri-op course.   Alexis Frock 01/17/2020

## 2020-01-17 NOTE — Discharge Instructions (Signed)

## 2020-01-17 NOTE — Anesthesia Procedure Notes (Signed)
Arterial Line Insertion Start/End7/14/2021 8:40 AM, 01/17/2020 8:48 AM Performed by: Josephine Igo, MD, anesthesiologist  Patient location: OR. Preanesthetic checklist: patient identified, IV checked, site marked, risks and benefits discussed, surgical consent, monitors and equipment checked, pre-op evaluation, timeout performed and anesthesia consent Left, radial was placed Catheter size: 20 G Hand hygiene performed  and maximum sterile barriers used   Attempts: 2 Procedure performed without using ultrasound guided technique. Ultrasound Notes:anatomy identified Following insertion, Biopatch and dressing applied. Post procedure assessment: normal  Patient tolerated the procedure well with no immediate complications.

## 2020-01-17 NOTE — Transfer of Care (Signed)
Immediate Anesthesia Transfer of Care Note  Patient: Pamela [name]  Procedure(s) Performed: XI ROBOTIC ASSISTED LAPAROSCOPIC COMPLETE CYSTECT ILEAL CONDUIT, XI ROBOTIC ASSISTED LAPAPRSCOPIC HYSTERECTOMY, LEFT OOPHERECTOMY,SALPINGECTOMY;LYMPHADENECTOMY (N/A ) CYSTOSCOPY WITH INJECTION (N/A )  Patient Location: PACU  Anesthesia Type:General  Level of Consciousness: drowsy, patient cooperative and responds to stimulation  Airway & Oxygen Therapy: Patient Spontanous Breathing and Patient connected to face mask oxygen  Post-op Assessment: Report given to RN and Post -op Vital signs reviewed and stable  Post vital signs: Reviewed and stable  Last Vitals:  Vitals Value Taken Time  BP 115/73 01/17/20 1510  Temp    Pulse 65 01/17/20 1514  Resp 14 01/17/20 1514  SpO2 100 % 01/17/20 1514  Vitals shown include unvalidated device data.  Last Pain:  Vitals:   01/17/20 0719  TempSrc: Oral  PainSc:          Complications: No complications documented.

## 2020-01-17 NOTE — Progress Notes (Signed)
Patient ordered Nulytely as prep for procedure in morning. Completed half of prep. Patient has had multiple clear stools with small amount of bile sediment.

## 2020-01-17 NOTE — Anesthesia Procedure Notes (Signed)
Procedure Name: Intubation Date/Time: 01/17/2020 8:41 AM Performed by: Glory Buff, CRNA Pre-anesthesia Checklist: Patient identified, Emergency Drugs available, Suction available and Patient being monitored Patient Re-evaluated:Patient Re-evaluated prior to induction Oxygen Delivery Method: Circle system utilized Preoxygenation: Pre-oxygenation with 100% oxygen Induction Type: IV induction Ventilation: Mask ventilation without difficulty Laryngoscope Size: Miller and 3 Grade View: Grade II Tube type: Oral Tube size: 7.0 mm Number of attempts: 1 Airway Equipment and Method: Stylet and Oral airway Placement Confirmation: ETT inserted through vocal cords under direct vision,  positive ETCO2 and breath sounds checked- equal and bilateral Secured at: 20 cm Tube secured with: Tape Dental Injury: Teeth and Oropharynx as per pre-operative assessment

## 2020-01-18 ENCOUNTER — Encounter (HOSPITAL_COMMUNITY): Payer: Self-pay | Admitting: Urology

## 2020-01-18 LAB — HEMOGLOBIN AND HEMATOCRIT, BLOOD
HCT: 31.4 % — ABNORMAL LOW (ref 36.0–46.0)
Hemoglobin: 10.4 g/dL — ABNORMAL LOW (ref 12.0–15.0)

## 2020-01-18 LAB — BASIC METABOLIC PANEL
Anion gap: 12 (ref 5–15)
BUN: 19 mg/dL (ref 8–23)
CO2: 24 mmol/L (ref 22–32)
Calcium: 7.7 mg/dL — ABNORMAL LOW (ref 8.9–10.3)
Chloride: 101 mmol/L (ref 98–111)
Creatinine, Ser: 1.86 mg/dL — ABNORMAL HIGH (ref 0.44–1.00)
GFR calc Af Amer: 30 mL/min — ABNORMAL LOW (ref >60)
GFR calc non Af Amer: 26 mL/min — ABNORMAL LOW (ref >60)
Glucose, Bld: 120 mg/dL — ABNORMAL HIGH (ref 70–99)
Potassium: 4.4 mmol/L (ref 3.5–5.1)
Sodium: 137 mmol/L (ref 135–145)

## 2020-01-18 MED ORDER — ENOXAPARIN SODIUM 40 MG/0.4ML ~~LOC~~ SOLN
40.0000 mg | SUBCUTANEOUS | Status: DC
  Administered 2020-01-18: 40 mg via SUBCUTANEOUS
  Filled 2020-01-18: qty 0.4

## 2020-01-18 NOTE — Progress Notes (Signed)
1 Day Post-Op Subjective: The patient did well overnight. Endorses mild back discomfort but overall minimal pain. Denies flatus/BM. No nausea or vomiting.   Received 1u pRBC yesterday afternoon for post op hemoglobin 7.8. 375cc urine output, 118cc from JP drain.  Objective: Vital signs in last 24 hours: Temp:  [97.5 F (36.4 C)-98.6 F (37 C)] 98.6 F (37 C) (07/15 0351) Pulse Rate:  [64-100] 75 (07/15 0700) Resp:  [11-20] 14 (07/15 0700) BP: (115-176)/(46-91) 133/51 (07/15 0700) SpO2:  [95 %-100 %] 100 % (07/15 0700) Arterial Line BP: (123-181)/(62-107) 171/72 (07/14 1630) Weight:  [82.8 kg] 82.8 kg (07/14 1715)  Intake/Output from previous day: 07/14 0701 - 07/15 0700 In: 4706.4 [I.V.:3492.4; Blood:573.8; IV Piggyback:640.3] Out: 998 [Urine:525; Drains:173; Blood:300] Intake/Output this shift: No intake/output data recorded.  Physical Exam:  General: Alert and oriented. CV: RRR Lungs: Breathing comfortably on [place] O2 GI: Soft, Nondistended, minimally tender. Stoma dark pink, healthy appearing, 2 stents emanating in appropriate position. JP with scant serosanguinous fluid Incisions: Clean, dry, and intact Urine: Clear Extremities: Nontender, no erythema, no edema.  Lab Results:  CBC Latest Ref Rng & Units 01/18/2020 01/17/2020 01/16/2020  WBC 4.0 - 10.5 K/uL - - 7.3  Hemoglobin 12.0 - 15.0 g/dL 10.4(L) 7.8(L) 10.3(L)  Hematocrit 36 - 46 % 31.4(L) 24.5(L) 31.1(L)  Platelets 150 - 400 K/uL - - 230   BMP Latest Ref Rng & Units 01/18/2020 01/16/2020 11/28/2019  Glucose 70 - 99 mg/dL 120(H) 105(H) 91  BUN 8 - 23 mg/dL 19 18 15   Creatinine 0.44 - 1.00 mg/dL 1.86(H) 1.46(H) 1.37(H)  BUN/Creat Ratio 12 - 28 - - -  Sodium 135 - 145 mmol/L 137 138 140  Potassium 3.5 - 5.1 mmol/L 4.4 4.4 4.5  Chloride 98 - 111 mmol/L 101 99 103  CO2 22 - 32 mmol/L 24 27 25   Calcium 8.9 - 10.3 mg/dL 7.7(L) 9.1 7.7(L)     Assessment/Plan: POD# 1 s/p robotic radical cystectomy, hysterectomy, left  oophorectomy with creation of ileal conduit urinary diversion.  1) Continue IVF 2) Ambulate, Incentive spirometry, wean O2 3) Advance diet to clear liquids 4) Continue entereg 5) Continue JP drain 6) Start lovenox 40mg  7) Vaginal packing removed on rounds this morning 8) Floor status     LOS: 2 days   Pamela [name] 01/18/2020, 7:21 AM

## 2020-01-18 NOTE — Progress Notes (Signed)
Report called to Pamela Peals, RN on 6E. All questions answered at this time. All pt belongings sent with pt as well as chart. Pt transferred by RN and NT in the bed. 6E now managing care of pt.

## 2020-01-18 NOTE — Op Note (Signed)
Pamela [name], Pamela [name] MEDICAL RECORD [id number] ACCOUNT [id number] DATE OF BIRTH:May 24, 1946 FACILITY: [facility] LOCATION: [id number] PHYSICIAN:Jacquelyn Antony Tresa Moore, MD  OPERATIVE REPORT  DATE OF PROCEDURE:  01/17/2020  SURGEON:  Alexis Frock, MD  PREOPERATIVE DIAGNOSIS:  Muscle invasive bladder cancer.  PROCEDURE: 1.  Cystoscopy with injection of indocyanine green dye. 2.  Robotic-assisted laparoscopic radical cystectomy with bilateral pelvic lymphadenectomy and ileal conduit urinary diversion. 3.  Radical hysterectomy, laparoscopic, with left salpingo-oophorectomy.  ASSISTANT:  Amanda L. Dancy, PA  DRAINS:   1.  Jackson-Pratt drain to suction. 2.  Right lower quadrant urostomy with right side (red), left (blue) bander stents.  FINDINGS: 1.  Loose adhesions of the small and large bowel in the pelvis from prior surgery. 2.  Sentinel lymph nodes in the right hemiabdomen noted on pathology requisition.  SPECIMENS: 1.  Bladder plus urethra plus anterior vaginal wall plus uterus plus cervix plus left tubes and ovaries en bloc. 2.  Right external iliac lymph nodes, right obturator lymph nodes. 3.  Right common iliac lymph nodes. 4.  Left common iliac lymph nodes. 5.  Left external iliac lymph nodes. 6.  Left obturator lymph nodes for pathology.    ESTIMATED BLOOD LOSS:  200 mL.  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  She underwent a cystoscopic resection and decided to proceed with curative intent path with neoadjuvant  chemotherapy, followed by a curative  cystectomy.  She underwent neoadjuvant chemo, tolerated this well, received ____ imaging without any locally advanced or distant disease.  Options were discussed, including continued curative path and proceeding with  cystectomy with urinary diversion and she wished to proceed.  Informed consent was obtained and placed in  the medical record.  She was admitted yesterday for stomal marking, labs and bowel prep.  She has had Lovenox bridging for history of factor V  [place] as performed as per instructions.  DESCRIPTION OF PROCEDURE:  The patient being identified, the procedure being robotic cystectomy with node dissection and pelvic exenteration, conduit diversion was confirmed.  Procedure timeout was performed.  Intravenous antibiotics were administered.   General endotracheal anesthesia introduced.  Sterile field was created, prepping and draping  the patient's vagina, introitus and proximal thighs using iodine and her infra-xiphoid abdomen utilizing gluconate, after she was further fastened to operative  table using 3-inch tape over foam padding across the supraxiphoid chest.  Her arms tucked at her side with gel roll.  A test of steep Trendelenburg positioning was performed.  She was found to be suitably positioned.  Attention was directed at  cystoscopy, injection of ICG dye.  Cystourethroscopy was performed using a 24-French rigid injection scope set with 0-degree lens.  Inspection of the bladder revealed essentially no volume, intraluminal tumor.  Ureteral orifices were in anatomic  location.  As such, 2 mL  of indocyanine green dye was injected across approximately 5 mucosal blebs in the intertrigone area per sentinel lymph node angiography and a new silicone catheter was placed free to straight drain.  Next, high-flow,  low-pressure pneumoperitoneum was obtained using Veress technique in the supraumbilical midline having passed the aspiration and drop test.  An 8 mm robotic camera port was then placed in same location.  Laparoscopic examination of the peritoneal cavity  revealed some loose adhesions in the deep pelvis consistent with prior surgical scarring.  It was not severe.  Distal ports were placed as follows:  Right paramedian 8 mm  robotic port, right far lateral 12 mm AirSeal assist port, right paramedian 15 mm   assist port at the site of the previously marked stomal site, left paramedian 8 mm robotic port, left far lateral ilium robotic port.  Robot was docked and passed the electronic checks.  Attention was directed at adhesiolysis.  Loose attachments were  taken down between the areas of the sigmoid and descending colon to the posterior peritoneal surface of the uterus and bladder to allow better ____ motility.  Also, some adhesions in the area of the appendix were quite large.  Those were taken down.  No  evidence of active appendicitis was noted.  Attention was directed at the left-sided retroperitoneal dissection.  Incision was made lateral to the left median umbilical ligament from the anterior abdominal wall towards the area of the internal ring and  then lateral to the descending colon towards the area of the root of the diaphragm.  This created a large retroperitoneal flap.  This was carefully swept medially and the left ureter was encountered as it coursed over the iliac vessels.  It was marked  with a vessel loop, dissected proximally for a distance of approximately 8 cm to the area of the gonadal crossing.  Gonadal vessels were purposely ligated using Hem-o-Lok clip x2 proximal and distal.  Given the left tube and ovary was tethered on its  vascular stalk to the uterus, the ureter was then dissected distally towards the area of the ureterovesical junction, which was doubly clipped and ligated, tied with a dyed proximal suture.  Frozen section negative for cancer.  The left bladder wall was  carefully swept away from the pelvic sidewall towards the area of the urethra and the endopelvic fascia was carefully swept away from the distal bladder.  Attention was directed at left-sided pelvic lymphadenectomy.  Sentinel lymphangiography revealed  excellent parenchymal uptake of the dye in the trigone of the bladder and several lymphatic channels coursing towards the pelvic lymph node fields, right greater than  left.  There were no obvious sentinel lymph nodes within the left hemipelvis.  As such,  standard template lymphadenectomy was performed in the left side, first the left external iliac lymph nodes with the boundaries being left external iliac artery, vein, pelvic sidewall, iliac bifurcation.  Lymphostasis was achieved with cold clips.   Next, the left obturator group was dissected free with the boundaries being left external iliac vein, pelvic sidewall, obturator nerve.  Lymphostasis was achieved with cold clips, set aside, labeled left obturator lymph nodes.  Left obturator nerve was  inspected following these maneuvers and found to be uninjured. Next, the left common iliac group was dissected free from the area of the iliac bifurcation proximally to the aortic bifurcation.  There was minimal to moderate amount of tissue in this  packet.  It was set aside labeled as such.  This completed the left retroperitoneal dissection.  Attention was directed to the right side.  Incision was made lateral to right medial umbilical ligament from the anterior abdominal wall, lateral to the  right bladder coursing along the iliac vessels and superiorly lateral to the ascending colon and this large retroperitoneal flap was carefully mobilized medially.  The right ureter was encountered as it coursed over the iliac vessels, marked with a  vessel loop, dissected distally to the ureterovesical junction, where it was doubly clipped and ligated, marked with a white tagged suture proximally and mobilized proximally to the area of the iliac crossing approximately 6 cm  proximal to the iliac  crossing and the right ureter was tucked out of the true pelvis.  Attention was directed at right-sided lymphadenectomy.  Under sentinel lymphangiography, there were several areas of sentinel nodes noted, mostly in the right external iliac group and  right common iliac group.  A standard template lymphadenectomy was performed on the right side,  first the right external iliac group with boundaries being right external iliac artery, vein, pelvic sidewall, iliac bifurcation.  Lymphostasis was achieved  with cold clips and the right obturator group was dissected free with the boundaries being right external iliac vein, pelvic sidewall, obturator nerve.  Lymphostasis was achieved with cold clips, set aside, labeled right obturator lymph nodes.  The right  obturator nerve was inspected following these maneuvers and found to be uninjured, and finally fibrofatty tissue overlying the distribution between the aortic bifurcation and iliac bifurcation was dissected free, set aside, labeled right common iliac  lymph nodes and the bladder wall on the right side was swept away from the pelvic sidewall towards the area of the endopelvic fascia, which was swept away towards the area of the urethra.  The ileocecal junction was identified and traced proximally and a  silk tag suture was placed on the epiploic fat approximately 14 cm proximal to the ileocecal junction with an untagged clip distal to this to denote proximal distal orientation of the conduit.  Next, a retroperitoneal tunnel was created just anterior to  the aortic bifurcation on the right side to the left side and the left ureter was brought through this tunnel to retroperitonealize it and the right ureter, left ureter, distal ileum tags sutures were placed into a Hem-o-Lok clip and tucked out of the  true pelvis.  Attention was directed at posterior dissection.  A sponge stick was placed per vagina to denote the posterior fornix.  An incision was made directly under this and dissection proceeded within the vaginal vault distally towards the area of  the urethra, taking approximately one-third the circumference of the vaginal wall anteriorly toward the area of the deep pelvis.  This exposed the vascular pedicles on each side, which were controlled using a sequential stapling technique with 2 white   loads x2 each side, taking exquisite care to avoid any rectal injury or major vascular injury, which did not occur.  The space of Retzius was then developed between the medial umbilical ligaments in the anterior plane and the bladder wall was dropped.   Next, dissection proceeded distally.  The dorsal vein of the clitoris was very atretic, did not require stapling and the urethral meatus was excised circumferentially using a combination of anterior and lateral dissection.  The in situ Foley catheter was  doubly clipped and ligated using a bucket handle and the uterus plus ovaries plus anterior vaginal wall plus cervix plus bladder specimen was placed into an extra-large EndoCatch bag for later retrieval and tucked the left-sided true pelvis.  Attention  was directed at vaginal cuff reapproximation.  The superior aspect of the vaginal cuff was developed ____ the clamshell anteriorly and resection was performed using double arm 2-0 V-Loc suture from the 6 o'clock of the prior area of urethral meatus,  running superolaterally, thus reconstructing the vagina and performing total urethrectomy.  The vagina was inspected using vaginal exam, indicator glove and laparoscopic vision and no persistent defect was noted.  Sponge and needle counts were correct.   Hemostasis was excellent.  We achieved the goals of the extirpative portion of the  surgery today.  A closed suction drain was brought through the previous left lateral most port site into the peritoneal cavity.  The specimen bag was brought through the  left paramedian robotic port site and the right ureter, left ureter, terminal ileum tag sutures were placed into a locking laparoscopic grasper via the 15 mm port site.  The robot was then undocked.  Incision was then made.  This was approximately 6 cm  inferior, erring to the left side of the umbilicus from the camera port site.  This allowed the specimen to be retrieved.  It was set aside for permanent  pathology en bloc.  A wound protector type retractor was then placed at the extraction site and the  right ureter, left ureter, distal ileum tags sutures were brought through this and exchanged for a Babcock forceps.  Attention was directed at harvesting of the conduit bowel.  A segment of distal ileum 14 cm in length was taken out of continuity using a  green load stapler x1 each side, taking exquisite care to maintain the integrity of the distal most terminal ileum by at least 12 cm.  The mesentery was developed with white load stapler x2 distal, x1 proximal, taking exquisite care to avoid  devascularization of the conduit or anastomotic segments.  Conduit was laid into retroperitoneal orientation and side-to-side bowel-to-bowel anastomosis was performed using 1-1/2 distance loads of green stapler on the antimesenteric border, the free end  being oversewn using running silk, the second imbricating layer of running silk.  Mesenteric defect was reapproximated using interrupted silk.  The bowel-to-bowl anastomosis was visibly viable and palpably patent.  It was redelivered into the abdominal  cavity.  The proximal staple line of the conduit was excluded using running Vicryl.  Distal staple line was removed.  Attention was directed at ureteroenteric anastomosis.  First, through the left ureter, a 4 mm area of proximal conduit close to the  mesenteric border was excised using Potts scissors and 4 mucosal everting sutures of 4-0 Vicryl were placed.  The left ureter was spatulated for a distance of approximately 1 cm.  The final margin set aside for pathology and inked.  A heel stitch of 4-0  Vicryl was applied and a blue colored bander stent was placed, 26 cm from the anastomosis and ureteroenteric anastomosis mucosa level  was performed using 2 separate running suture lines of 4-0 Vicryl.  This resulted in excellent anastomosis in a  tension-free fashion.  A mirror image ureteral anastomosis was performed of  the right ureter on the contralateral side of the conduit by placing a red colored bander stent to 25 cm of the anastomosis on the right side.  ____ a final dissection was set aside as well.  It was final right distal ureteral margin.  Attention was then directed at conduit formation.  A column approximately 15 mm in diameter  of skin and fibrofatty tissue was excised at the previously marked stomal site and 15 mm port site, down to the level of fascia, which was dilated to accommodate 2 surgeon's fingers and 4 fascial anchoring sutures of 2-0 Vicryl were placed.  The conduit  was essentially was then brought through this easily.  There was sufficient length and the fascial anchoring sutures were placed in a quadrant fashioned to the level of the proximal conduit as it coursed through the fascia at the external level and 4  rosebudding sutures were applied.  The extraction site was once again inspected.  Omentum was brought  over the area of extraction and the fascia was reapproximated using figure-of-eight PDS x4, followed by reapproximation of Scarpa's with running Vicryl.   All incision sites were infiltrated with dilute lipolyzed Marcaine and closed at the level skin using subcuticular Monocryl and Dermabond.  Final conduit rosebudding was performed and conduit mucosa-to-skin reapproximation with interrupted Vicryl x2 to  each quadrant, which resulted in excellent stoma formation.  A stomal appliance was placed and the procedure was terminated.  The patient tolerated the procedure well.  No immediate perioperative complications.  The patient was taken to postanesthesia  care in stable condition.  Plan for step down admission.  Please note, first assistant Debbrah Alar was crucial for all portions of the surgery today.  She provided invaluable retraction, specimen manipulation, vascular clipping, vascular stapling and general first assistance.  VN/NUANCE  D:01/17/2020 T:01/17/2020 JOB:[id number]

## 2020-01-19 LAB — HEMOGLOBIN AND HEMATOCRIT, BLOOD
HCT: 35.5 % — ABNORMAL LOW (ref 36.0–46.0)
Hemoglobin: 11.5 g/dL — ABNORMAL LOW (ref 12.0–15.0)

## 2020-01-19 LAB — BASIC METABOLIC PANEL
Anion gap: 10 (ref 5–15)
BUN: 19 mg/dL (ref 8–23)
CO2: 25 mmol/L (ref 22–32)
Calcium: 8 mg/dL — ABNORMAL LOW (ref 8.9–10.3)
Chloride: 101 mmol/L (ref 98–111)
Creatinine, Ser: 1.87 mg/dL — ABNORMAL HIGH (ref 0.44–1.00)
GFR calc Af Amer: 30 mL/min — ABNORMAL LOW (ref >60)
GFR calc non Af Amer: 26 mL/min — ABNORMAL LOW (ref >60)
Glucose, Bld: 130 mg/dL — ABNORMAL HIGH (ref 70–99)
Potassium: 4.3 mmol/L (ref 3.5–5.1)
Sodium: 136 mmol/L (ref 135–145)

## 2020-01-19 MED ORDER — DEXTROSE-NACL 5-0.45 % IV SOLN: INTRAVENOUS | Status: DC

## 2020-01-19 MED ORDER — ALVIMOPAN 12 MG PO CAPS
12.0000 mg | ORAL_CAPSULE | ORAL | Status: AC
  Filled 2020-01-19: qty 1

## 2020-01-19 MED ORDER — SODIUM CHLORIDE 0.9 % IV SOLN: INTRAVENOUS | Status: DC

## 2020-01-19 MED ORDER — ENOXAPARIN SODIUM 40 MG/0.4ML ~~LOC~~ SOLN: 40.0000 mg | Freq: Two times a day (BID) | SUBCUTANEOUS | Status: DC

## 2020-01-19 MED ORDER — ENOXAPARIN SODIUM 40 MG/0.4ML ~~LOC~~ SOLN
40.0000 mg | Freq: Two times a day (BID) | SUBCUTANEOUS | Status: DC
  Administered 2020-01-19 (×2): 40 mg via SUBCUTANEOUS
  Filled 2020-01-19 (×2): qty 0.4

## 2020-01-19 NOTE — Care Management Important Message (Signed)
Important Message  Patient Details IM Letter given to Marney Doctor RN Case Manager to present to the Patient Name: Pamela [name] MRN: [id number] Date of Birth: [birth year]   Medicare Important Message Given:  Yes     Kerin Salen 01/19/2020, 11:21 AM

## 2020-01-19 NOTE — Progress Notes (Signed)
2 Days Post-Op Subjective: "Pamela [name]" continues to do well. Tolerated clear liquid diet yesterday without nausea/emesis. Denies flatus/BM. Did not get out of bed yesterday. Did take one dose of dilaudid prior to bed last night.   Hb stable at 11.5 today. Creatinine 1.87. UOP increased, 1.35L for past 24 hours. JP drain output not recorded.   Objective: Vital signs in last 24 hours: Temp:  [98.1 F (36.7 C)-98.9 F (37.2 C)] 98.6 F (37 C) (07/16 0542) Pulse Rate:  [73-93] 93 (07/16 0542) Resp:  [14-20] 17 (07/16 0542) BP: (119-161)/(40-81) 127/67 (07/16 0542) SpO2:  [92 %-100 %] 92 % (07/16 0542)  Intake/Output from previous day: 07/15 0701 - 07/16 0700 In: 915.2 [P.O.:290; I.V.:575.2; IV Piggyback:50] Out: 1350 [Urine:1350] Intake/Output this shift: No intake/output data recorded.  Physical Exam:  General: Alert and oriented. CV: RRR Lungs: Breathing comfortably on room air GI: Soft, Nondistended, minimally tender. Stoma dark pink, healthy appearing, 2 stents emanating in appropriate position. Urine dark tea colored. JP with mostly serous fluid Incisions: Clean, dry, and intact Urine: Clear Extremities: Nontender, no erythema, no edema.  Lab Results:  CBC Latest Ref Rng & Units 01/19/2020 01/18/2020 01/17/2020  WBC 4.0 - 10.5 K/uL - - -  Hemoglobin 12.0 - 15.0 g/dL 11.5(L) 10.4(L) 7.8(L)  Hematocrit 36 - 46 % 35.5(L) 31.4(L) 24.5(L)  Platelets 150 - 400 K/uL - - -   BMP Latest Ref Rng & Units 01/19/2020 01/18/2020 01/16/2020  Glucose 70 - 99 mg/dL 130(H) 120(H) 105(H)  BUN 8 - 23 mg/dL 19 19 18   Creatinine 0.44 - 1.00 mg/dL 1.87(H) 1.86(H) 1.46(H)  BUN/Creat Ratio 12 - 28 - - -  Sodium 135 - 145 mmol/L 136 137 138  Potassium 3.5 - 5.1 mmol/L 4.3 4.4 4.4  Chloride 98 - 111 mmol/L 101 101 99  CO2 22 - 32 mmol/L 25 24 27   Calcium 8.9 - 10.3 mg/dL 8.0(L) 7.7(L) 9.1     Assessment/Plan: POD# 1 s/p robotic radical cystectomy, hysterectomy, left oophorectomy with creation of  ileal conduit urinary diversion, recovering well.  1) Decrease IVF 2) Ambulate, Incentive spirometry 3) Continue clear liquid diet, entereg until return of bowel function 5) Continue JP drain to bulb suction 6) Increase lovenox to 40mg  BID today 7) WOCN new ostomy teaching 8) PT consult, OOB today    LOS: 3 days   [name] 01/19/2020, 7:37 AM

## 2020-01-19 NOTE — TOC Initial Note (Signed)
Transition of Care [facility]) - Initial/Assessment Note    Patient Details  Name: Pamela [name] MRN: [id number] Date of Birth: Dec 04, 1945  [facility] CM/SW Contact:    Lynnell Catalan, RN Phone Number: 01/19/2020, 3:30 PM  Clinical Narrative:                  Pt from home alone. She does state that she will have people helping her at home. She states that she has done her first teaching session with [facility] on her new ostomy. Choice offered for home health services and [facility] chosen. [facility] liaison contacted for referral. Will need MD orders for HHPT/RN at dc.  Expected Discharge Plan: [place] Barriers to Discharge: Continued Medical Work up   Patient Goals and CMS Choice Patient states their goals for this hospitalization and ongoing recovery are:: To get home CMS Medicare.gov Compare Post Acute Care list provided to:: Patient Choice offered to / list presented to : Patient  Expected Discharge Plan and Services Expected Discharge Plan: [place]   Discharge Planning Services: CM Consult Post Acute Care Choice: [place] arrangements for the past 2 months: [place] Arranged: RN, PT [facility] Agency: [place] ([facility]) Date [place]: 01/19/20   Representative spoke with at [place]: Santiago Glad  Prior Living Arrangements/Services Living arrangements for the past 2 months: [place] Lives with:: Self Patient language and need for interpreter reviewed:: Yes Do you feel safe going back to the place where you live?: Yes      Need for Family Participation in Patient Care: Yes (Comment) Care giver support system in place?: Yes (comment)   Criminal Activity/Legal Involvement Pertinent to Current Situation/Hospitalization: No - Comment as needed  Activities of Daily Living Home Assistive Devices/Equipment: Eyeglasses ADL Screening (condition at  time of admission) Patient's cognitive ability adequate to safely complete daily activities?: Yes Is the patient deaf or have difficulty hearing?: No Does the patient have difficulty seeing, even when wearing glasses/contacts?: No Does the patient have difficulty concentrating, remembering, or making decisions?: No Patient able to express need for assistance with ADLs?: Yes Does the patient have difficulty dressing or bathing?: No Independently performs ADLs?: Yes (appropriate for developmental age) Does the patient have difficulty walking or climbing stairs?: No Weakness of Legs: None Weakness of Arms/Hands: None  Permission Sought/Granted Permission sought to share information with : Chartered certified accountant granted to share information with : Yes, Verbal Permission Granted     Permission granted to share info w AGENCY: [facility]        Emotional Assessment Appearance:: Appears stated age     Orientation: : Oriented to Self, Oriented to Place, Oriented to  Time, Oriented to Situation      Admission diagnosis:  Bladder cancer [facility]) [C67.9] Patient Active Problem List   Diagnosis Date Noted  . Bladder cancer ([place]) 01/16/2020  . Bladder carcinoma ([place]) 08/11/2019  . Gross hematuria 06/13/2019  . Osteoarthritis 03/02/2019  . Vitamin B 12 deficiency 03/01/2018  . History of gout 03/04/2015  . Goals of care, counseling/discussion 09/27/2013  . 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 GERD (gastroesophageal reflux disease) 03/03/2011  . Obesity (BMI 30.0-34.9) 04/24/2008  . GLAUCOMA 04/24/2008  . Osteopenia of the elderly 04/24/2008  .  Essential hypertension, benign 04/24/2008  . Secondary hypercoagulable state ([place]) 06/03/2006  . Hypothyroidism 04/21/2006   PCP:  Angelica Pou, MD Pharmacy:   [facility], [place] - [place] [address] [address] Phone:  [phone] Fax: [phone]     Social Determinants of Health (SDOH) Interventions    Readmission Risk Interventions No flowsheet data found.

## 2020-01-19 NOTE — Evaluation (Signed)
Physical Therapy Evaluation Patient Details Name: Pamela [name] MRN: [id number] DOB: May 15, 1946 Today's Date: 01/19/2020   History of Present Illness  Patient is 74 y.o. 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 PMH significant for HTN, HLD, Glaucoma, hypothyroidism.    Clinical Impression  Pamela [name] is 74 y.o. female admitted with above HPI and diagnosis. Patient is currently limited by functional impairments below (see PT problem list). Patient lives alone and is independent at baseline. She currently requires min assist for mobility and has family available to assist as needed. Patient will benefit from continued skilled PT interventions to address impairments and progress independence with mobility, recommending HHPT. Acute PT will follow and progress as able.     Follow Up Recommendations Home health PT    Equipment Recommendations  None recommended by PT    Recommendations for Other Services       Precautions / Restrictions Precautions Precautions: Fall Precaution Comments: denies falls in last 6 months Restrictions Weight Bearing Restrictions: No      Mobility  Bed Mobility Overal bed mobility: Needs Assistance Bed Mobility: Supine to Sit     Supine to sit: HOB elevated;Min assist;Min guard     General bed mobility comments: light min assist/guard with tactile and verbal cues to sequence log roll. pt required extra time to roll and raise trunk.   Transfers Overall transfer level: Needs assistance Equipment used: Rolling walker (2 wheeled) Transfers: Sit to/from Stand Sit to Stand: Min assist;From elevated surface         General transfer comment: cues for safe technique with RW. min assist to complete rise from elevated surface.  Ambulation/Gait Ambulation/Gait assistance: Min  assist Gait Distance (Feet): 80 Feet Assistive device: Rolling walker (2 wheeled) Gait Pattern/deviations: Step-through pattern;Decreased stride length Gait velocity: decr   General Gait Details: cues for safe step pattern and proximity to RW. Min assist to manage RW.   Stairs            Wheelchair Mobility    Modified Rankin (Stroke Patients Only)       Balance Overall balance assessment: Needs assistance Sitting-balance support: Feet supported Sitting balance-Leahy Scale: Good     Standing balance support: During functional activity;Bilateral upper extremity supported Standing balance-Leahy Scale: Fair                  Pertinent Vitals/Pain Pain Assessment: Faces Faces Pain Scale: Hurts little more Pain Location: abdomen Pain Descriptors / Indicators: Discomfort;Grimacing;Guarding Pain Intervention(s): Limited activity within patient's tolerance;Monitored during session;Repositioned    Home Living Family/patient expects to be discharged to:: Private residence Living Arrangements: Alone Available Help at Discharge: Family Type of Home: House Home Access: Stairs to enter Entrance Stairs-Rails: Right Entrance Stairs-Number of Steps: 3+1 Home Layout: Two level;Able to live on main level with bedroom/bathroom;Bed/bath upstairs;1/2 bath on main level Home Equipment: Walker - 2 wheels;Cane - single point;Shower seat;Bedside commode Additional Comments: pt plans to stay on main level initially once returned home    Prior Function Level of Independence: Independent         Comments: enjoys reading     Hand Dominance   Dominant Hand: Right    Extremity/Trunk Assessment   Upper Extremity Assessment Upper Extremity Assessment: Overall WFL for tasks assessed    Lower Extremity Assessment Lower Extremity Assessment: Overall WFL for tasks assessed    Cervical /  Trunk Assessment Cervical / Trunk Assessment: Normal  Communication   Communication: No  difficulties  Cognition Arousal/Alertness: Awake/alert Behavior During Therapy: WFL for tasks assessed/performed Overall Cognitive Status: Within Functional Limits for tasks assessed                     General Comments      Exercises     Assessment/Plan    PT Assessment Patient needs continued PT services  PT Problem List Decreased strength;Decreased range of motion;Decreased balance;Decreased activity tolerance;Decreased mobility;Decreased knowledge of use of DME       PT Treatment Interventions DME instruction;Gait training;Stair training;Functional mobility training;Therapeutic activities;Therapeutic exercise;Balance training;Patient/family education    PT Goals (Current goals can be found in the Care Plan section)  Acute Rehab PT Goals Patient Stated Goal: get home PT Goal Formulation: With patient Time For Goal Achievement: 02/02/20 Potential to Achieve Goals: Good    Frequency 7X/week   Barriers to discharge        AM-PAC PT "6 Clicks" Mobility  Outcome Measure Help needed turning from your back to your side while in a flat bed without using bedrails?: A Little Help needed moving from lying on your back to sitting on the side of a flat bed without using bedrails?: A Little Help needed moving to and from a bed to a chair (including a wheelchair)?: A Little Help needed standing up from a chair using your arms (e.g., wheelchair or bedside chair)?: A Little Help needed to walk in hospital room?: A Little Help needed climbing 3-5 steps with a railing? : A Lot 6 Click Score: 17    End of Session Equipment Utilized During Treatment: Gait belt Activity Tolerance: Patient tolerated treatment well Patient left: in chair;with call bell/phone within reach;with chair alarm set Nurse Communication: Mobility status PT Visit Diagnosis: Muscle weakness (generalized) (M62.81);Difficulty in walking, not elsewhere classified (R26.2)    Time: 4128-7867 PT Time  Calculation (min) (ACUTE ONLY): 27 min   Charges:   PT Evaluation $PT Eval Low Complexity: 1 Low PT Treatments $Gait Training: 8-22 mins        Verner Mould, DPT Acute Rehabilitation Services  Office [phone] Pager [phone]  01/19/2020 1:56 PM

## 2020-01-19 NOTE — Consult Note (Addendum)
[place] Nurse ostomy consult note Stoma type/location: RLQ, end ileal conduit Stomal assessment/size: 1 1/2" with 2 stents in place Peristomal assessment: intact, dips in the skin at 3 and 9 o'clock  Treatment options for stomal/peristomal skin: 2" barrier ring Output yellow urine with sediment Ostomy pouching: 1pc.convex with 2" barrier ring.  Education provided:  Explained role of ostomy nurse and creation of stoma  Explained stoma characteristics (budded, flush, color, texture, care) Demonstrated pouch change (cutting new barrier, measuring stoma, cleaning peristomal skin and stoma, use of barrier ring) Education on emptying when 1/3 to 1/2 full and how to empty Education on urine characteristics (sediment, mucous) Demonstrated hooking pouch to nighttime drainage bag Patient demonstrated hooking to nighttime drainage and opening and closing pouch today for nurse.   Enrolled patient in [place] program: Yes  [place] Nurse will follow along with you for continued support with ostomy teaching and care [place] MSN, [employer], [place], [place], [place]

## 2020-01-20 LAB — TYPE AND SCREEN
ABO/RH(D): A POS
Antibody Screen: NEGATIVE
Unit division: 0
Unit division: 0
Unit division: 0
Unit division: 0
Unit division: 0
Unit division: 0

## 2020-01-20 LAB — BASIC METABOLIC PANEL
Anion gap: 9 (ref 5–15)
BUN: 17 mg/dL (ref 8–23)
CO2: 26 mmol/L (ref 22–32)
Calcium: 7.4 mg/dL — ABNORMAL LOW (ref 8.9–10.3)
Chloride: 100 mmol/L (ref 98–111)
Creatinine, Ser: 1.73 mg/dL — ABNORMAL HIGH (ref 0.44–1.00)
GFR calc Af Amer: 33 mL/min — ABNORMAL LOW (ref >60)
GFR calc non Af Amer: 28 mL/min — ABNORMAL LOW (ref >60)
Glucose, Bld: 114 mg/dL — ABNORMAL HIGH (ref 70–99)
Potassium: 3.7 mmol/L (ref 3.5–5.1)
Sodium: 135 mmol/L (ref 135–145)

## 2020-01-20 LAB — BPAM RBC
Blood Product Expiration Date: 202107292359
Blood Product Expiration Date: 202108012359
Blood Product Expiration Date: 202108042359
Blood Product Expiration Date: 202108072359
Blood Product Expiration Date: 202108072359
Blood Product Expiration Date: 202108072359
ISSUE DATE / TIME: 202107142025
ISSUE DATE / TIME: 202107150040
ISSUE DATE / TIME: 202107151729
ISSUE DATE / TIME: 202107152001
Unit Type and Rh: 6200
Unit Type and Rh: 6200
Unit Type and Rh: 6200
Unit Type and Rh: 6200
Unit Type and Rh: 6200
Unit Type and Rh: 6200

## 2020-01-20 LAB — HEMOGLOBIN AND HEMATOCRIT, BLOOD
HCT: 33.6 % — ABNORMAL LOW (ref 36.0–46.0)
Hemoglobin: 10.6 g/dL — ABNORMAL LOW (ref 12.0–15.0)

## 2020-01-20 MED ORDER — ENOXAPARIN SODIUM 80 MG/0.8ML ~~LOC~~ SOLN
80.0000 mg | Freq: Two times a day (BID) | SUBCUTANEOUS | Status: DC
  Administered 2020-01-20 – 2020-01-24 (×9): 80 mg via SUBCUTANEOUS
  Filled 2020-01-20 (×9): qty 0.8

## 2020-01-20 NOTE — Progress Notes (Signed)
3 Days Post-Op Subjective: Tolerating clear liquids. hgb 9.9. mild abdominal pain. Negative flatus  Objective: Vital signs in last 24 hours: Temp:  [98.2 F (36.8 C)-98.8 F (37.1 C)] 98.8 F (37.1 C) (07/17 1352) Pulse Rate:  [82-105] 86 (07/17 1352) Resp:  [14-16] 14 (07/17 1352) BP: (136-147)/(77-84) 147/77 (07/17 1352) SpO2:  [94 %-97 %] 97 % (07/17 1352)  Intake/Output from previous day: 07/16 0701 - 07/17 0700 In: 1506.6 [P.O.:840; I.V.:666.6] Out: 1450 [Urine:650; Drains:800] Intake/Output this shift: Total I/O In: 240 [P.O.:240] Out: 100 [Drains:100]  Physical Exam:  General: Alert and oriented. CV: RRR Lungs: Breathing comfortably on room air GI: Soft, Nondistended, minimally tender. Stoma dark pink, healthy appearing, 2 stents emanating in appropriate position. Urine dark tea colored. JP with mostly serous fluid Incisions: Clean, dry, and intact Urine: Clear Extremities: Nontender, no erythema, no edema.  Lab Results:  CBC Latest Ref Rng & Units 01/20/2020 01/19/2020 01/18/2020  WBC 4.0 - 10.5 K/uL - - -  Hemoglobin 12.0 - 15.0 g/dL 10.6(L) 11.5(L) 10.4(L)  Hematocrit 36 - 46 % 33.6(L) 35.5(L) 31.4(L)  Platelets 150 - 400 K/uL - - -   BMP Latest Ref Rng & Units 01/20/2020 01/19/2020 01/18/2020  Glucose 70 - 99 mg/dL 114(H) 130(H) 120(H)  BUN 8 - 23 mg/dL 17 19 19   Creatinine 0.44 - 1.00 mg/dL 1.73(H) 1.87(H) 1.86(H)  BUN/Creat Ratio 12 - 28 - - -  Sodium 135 - 145 mmol/L 135 136 137  Potassium 3.5 - 5.1 mmol/L 3.7 4.3 4.4  Chloride 98 - 111 mmol/L 100 101 101  CO2 22 - 32 mmol/L 26 25 24   Calcium 8.9 - 10.3 mg/dL 7.4(L) 8.0(L) 7.7(L)     Assessment/Plan: POD# 3 s/p robotic radical cystectomy, hysterectomy, left oophorectomy with creation of ileal conduit urinary diversion, recovering well.  1) Decrease IVF 2) Ambulate, Incentive spirometry 3) Continue clear liquid diet, entereg until return of bowel function 5) Continue JP drain to bulb suction 6)  Increase lovenox to 80mg  BID today     LOS: 4 days   Nicolette Bang 01/20/2020, 3:04 PM

## 2020-01-21 LAB — HEMOGLOBIN AND HEMATOCRIT, BLOOD
HCT: 31.6 % — ABNORMAL LOW (ref 36.0–46.0)
Hemoglobin: 10 g/dL — ABNORMAL LOW (ref 12.0–15.0)

## 2020-01-21 LAB — BASIC METABOLIC PANEL
Anion gap: 9 (ref 5–15)
BUN: 12 mg/dL (ref 8–23)
CO2: 26 mmol/L (ref 22–32)
Calcium: 7.5 mg/dL — ABNORMAL LOW (ref 8.9–10.3)
Chloride: 102 mmol/L (ref 98–111)
Creatinine, Ser: 1.38 mg/dL — ABNORMAL HIGH (ref 0.44–1.00)
GFR calc Af Amer: 43 mL/min — ABNORMAL LOW (ref >60)
GFR calc non Af Amer: 37 mL/min — ABNORMAL LOW (ref >60)
Glucose, Bld: 147 mg/dL — ABNORMAL HIGH (ref 70–99)
Potassium: 3.5 mmol/L (ref 3.5–5.1)
Sodium: 137 mmol/L (ref 135–145)

## 2020-01-21 NOTE — Progress Notes (Signed)
4 Days Post-Op Subjective: Tolerating clear liquids. Hgb 10.0 . mild abdominal pain. Negative flatus  Objective: Vital signs in last 24 hours: Temp:  [98.2 F (36.8 C)-99 F (37.2 C)] 98.2 F (36.8 C) (07/18 1440) Pulse Rate:  [82-97] 88 (07/18 1440) Resp:  [15-18] 18 (07/18 1440) BP: (145-158)/(78-89) 158/89 (07/18 1440) SpO2:  [94 %-96 %] 96 % (07/18 1440)  Intake/Output from previous day: 07/17 0701 - 07/18 0700 In: 2314.4 [P.O.:960; I.V.:1254.4] Out: 350 [Drains:350] Intake/Output this shift: Total I/O In: 283.4 [I.V.:283.4] Out: 1430 [Urine:1200; Drains:230]  Physical Exam:  General: Alert and oriented. CV: RRR Lungs: Breathing comfortably on room air GI: Soft, Nondistended, minimally tender. Stoma dark pink, healthy appearing, 2 stents emanating in appropriate position. Urine dark tea colored. JP with mostly serous fluid Incisions: Clean, dry, and intact Urine: Clear Extremities: Nontender, no erythema, no edema.  Lab Results:  CBC Latest Ref Rng & Units 01/21/2020 01/20/2020 01/19/2020  WBC 4.0 - 10.5 K/uL - - -  Hemoglobin 12.0 - 15.0 g/dL 10.0(L) 10.6(L) 11.5(L)  Hematocrit 36 - 46 % 31.6(L) 33.6(L) 35.5(L)  Platelets 150 - 400 K/uL - - -   BMP Latest Ref Rng & Units 01/21/2020 01/20/2020 01/19/2020  Glucose 70 - 99 mg/dL 147(H) 114(H) 130(H)  BUN 8 - 23 mg/dL 12 17 19   Creatinine 0.44 - 1.00 mg/dL 1.38(H) 1.73(H) 1.87(H)  BUN/Creat Ratio 12 - 28 - - -  Sodium 135 - 145 mmol/L 137 135 136  Potassium 3.5 - 5.1 mmol/L 3.5 3.7 4.3  Chloride 98 - 111 mmol/L 102 100 101  CO2 22 - 32 mmol/L 26 26 25   Calcium 8.9 - 10.3 mg/dL 7.5(L) 7.4(L) 8.0(L)     Assessment/Plan: POD# 4 s/p robotic radical cystectomy, hysterectomy, left oophorectomy with creation of ileal conduit urinary diversion, recovering well.  1)  Ambulate, Incentive spirometry 2) Continue clear liquid diet, entereg until return of bowel function 3) Continue JP drain to bulb suction 4) continue lovenox  to 80mg  BID today     LOS: 5 days   Pamela [name] 01/21/2020, 6:41 PM

## 2020-01-22 ENCOUNTER — Ambulatory Visit: Payer: Medicare Other

## 2020-01-22 LAB — BASIC METABOLIC PANEL
Anion gap: 10 (ref 5–15)
BUN: 11 mg/dL (ref 8–23)
CO2: 25 mmol/L (ref 22–32)
Calcium: 7.7 mg/dL — ABNORMAL LOW (ref 8.9–10.3)
Chloride: 100 mmol/L (ref 98–111)
Creatinine, Ser: 1.2 mg/dL — ABNORMAL HIGH (ref 0.44–1.00)
GFR calc Af Amer: 51 mL/min — ABNORMAL LOW (ref >60)
GFR calc non Af Amer: 44 mL/min — ABNORMAL LOW (ref >60)
Glucose, Bld: 104 mg/dL — ABNORMAL HIGH (ref 70–99)
Potassium: 3.4 mmol/L — ABNORMAL LOW (ref 3.5–5.1)
Sodium: 135 mmol/L (ref 135–145)

## 2020-01-22 LAB — HEMOGLOBIN AND HEMATOCRIT, BLOOD
HCT: 30.2 % — ABNORMAL LOW (ref 36.0–46.0)
Hemoglobin: 9.9 g/dL — ABNORMAL LOW (ref 12.0–15.0)

## 2020-01-22 NOTE — Care Management Important Message (Signed)
Important Message  Patient Details IM Letter given to Marney Doctor RN Case Manager to present to the Patient Name: Pamela [name] MRN: [id number] Date of Birth: Jul 03, 1946   Medicare Important Message Given:  Yes     Kerin Salen 01/22/2020, 10:38 AM

## 2020-01-22 NOTE — Progress Notes (Signed)
5 Days Post-Op Subjective: Tolerating clear liquids. Bowel movement this morning. Hgb 9.9. Pain very well controlled. Ambulating daily.   1.6L UOP, 550cc from [place].   Objective: Vital signs in last 24 hours: Temp:  [98 F (36.7 C)-98.9 F (37.2 C)] 98 F (36.7 C) (07/19 0628) Pulse Rate:  [76-89] 76 (07/19 0628) Resp:  [16-18] 16 (07/19 0628) BP: (156-158)/(83-89) 157/83 (07/19 0628) SpO2:  [95 %-96 %] 95 % (07/19 0628)  Intake/Output from previous day: 07/18 0701 - 07/19 0700 In: 1219.8 [I.V.:1219.8] Out: 2100 [Urine:1650; Drains:450] Intake/Output this shift: No intake/output data recorded.  Physical Exam:  General: Alert and oriented. CV: RRR Lungs: Breathing comfortably on room air GI: Soft, Nondistended, minimally tender. Stoma dark pink, healthy appearing, 2 stents emanating in appropriate position. Urine clear yellow. JP with mostly serous fluid Incisions: Clean, dry, and intact Urine: Clear Extremities: Nontender, no erythema, no edema.  Lab Results:  CBC Latest Ref Rng & Units 01/22/2020 01/21/2020 01/20/2020  WBC 4.0 - 10.5 K/uL - - -  Hemoglobin 12.0 - 15.0 g/dL 9.9(L) 10.0(L) 10.6(L)  Hematocrit 36 - 46 % 30.2(L) 31.6(L) 33.6(L)  Platelets 150 - 400 K/uL - - -   BMP Latest Ref Rng & Units 01/22/2020 01/21/2020 01/20/2020  Glucose 70 - 99 mg/dL 104(H) 147(H) 114(H)  BUN 8 - 23 mg/dL 11 12 17   Creatinine 0.44 - 1.00 mg/dL 1.20(H) 1.38(H) 1.73(H)  BUN/Creat Ratio 12 - 28 - - -  Sodium 135 - 145 mmol/L 135 137 135  Potassium 3.5 - 5.1 mmol/L 3.4(L) 3.5 3.7  Chloride 98 - 111 mmol/L 100 102 100  CO2 22 - 32 mmol/L 25 26 26   Calcium 8.9 - 10.3 mg/dL 7.7(L) 7.5(L) 7.4(L)     Assessment/Plan: POD# 4 s/p robotic radical cystectomy, hysterectomy, left oophorectomy with creation of ileal conduit urinary diversion, recovering well.  1)  Ambulate, Incentive spirometry 2)  Advance to regular diet, discontinue entereg.  3) Continue JP drain to bulb suction, check JP  creatinine today 4) continue lovenox to 80mg  BID today 5) Continue WOCN teaching  6) Ambulate daily, PT consulted     LOS: 6 days   [name] 01/22/2020, 7:24 AM

## 2020-01-22 NOTE — Consult Note (Signed)
[facility] Nurse ostomy follow up Stoma type/location: RLQ, ileal conduit Stomal assessment/size: 1 1/2" round, budded, os at 7 o'clock, red and blue stents in place. Peristomal assessment: intact, dips in the abdominal topography at 3 and 9 o'clock  Treatment options for stomal/peristomal skin: using 2" barrier ring to flatten skin out Output yellow with some minimal sediment Ostomy pouching: 1pc.convex with 2" barrier ring.  Education provided:   Pt worked with ostomy nurse to change pouch  She has difficulty with cutting new skin barrier Re educated on measuring stoma Patient is quite aggressive in cleaning peristomal skin, re educated on the stoma being friable and the skin tender from scrubbing.  No need to scrub away tape residue Discussed stoma and stents, color expectation of stents to be removed but they may come out on their own Patient demonstrated use of barrier ring. I placed for her on the abdomen around the stoma Patient is a bit sporadic with actions Education on emptying when 1/3 to 1/2 full and how to empty Patient is independent with opening and closing spout and hooking to night time drainage. She verbalized understanding of cleaning her BSD bag   Provided patient with [employer] and marked items currently using  Enrolled patient in [employer] Discharge program: Yes  [place] Nurse will follow along with you for continued support with ostomy teaching and care [place] MSN, [place], [place], [place], [place]

## 2020-01-22 NOTE — Progress Notes (Signed)
Physical Therapy Treatment Patient Details Name: Pamela [name] MRN: [id number] DOB: 1945-11-02 Today's Date: 01/22/2020    History of Present Illness Patient is 74 y.o. 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 PMH significant for HTN, HLD, Glaucoma, hypothyroidism.    PT Comments    Progressing well with mobility.    Follow Up Recommendations  Home health PT     Equipment Recommendations  None recommended by PT    Recommendations for Other Services       Precautions / Restrictions Precautions Precautions: Fall Restrictions Weight Bearing Restrictions: No    Mobility  Bed Mobility Overal bed mobility: Needs Assistance Bed Mobility: Supine to Sit;Sit to Supine     Supine to sit: Modified independent (Device/Increase time) Sit to supine: Modified independent (Device/Increase time)      Transfers Overall transfer level: Needs assistance   Transfers: Sit to/from Stand Sit to Stand: Min guard            Ambulation/Gait Ambulation/Gait assistance: Min guard Gait Distance (Feet): 150 Feet Assistive device: IV Pole Gait Pattern/deviations: Step-through pattern;Decreased stride length         Stairs             Wheelchair Mobility    Modified Rankin (Stroke Patients Only)       Balance           Standing balance support: Single extremity supported Standing balance-Leahy Scale: Fair                              Cognition Arousal/Alertness: Awake/alert Behavior During Therapy: WFL for tasks assessed/performed Overall Cognitive Status: Within Functional Limits for tasks assessed                                        Exercises      General Comments        Pertinent Vitals/Pain Pain Assessment: Faces Faces Pain Scale: Hurts a little bit Pain  Location: abdomen Pain Descriptors / Indicators: Discomfort;Sore Pain Intervention(s): Monitored during session    Home Living                      Prior Function            PT Goals (current goals can now be found in the care plan section) Progress towards PT goals: Progressing toward goals    Frequency    Min 3X/week      PT Plan Current plan remains appropriate    Co-evaluation              AM-PAC PT "6 Clicks" Mobility   Outcome Measure  Help needed turning from your back to your side while in a flat bed without using bedrails?: A Little Help needed moving from lying on your back to sitting on the side of a flat bed without using bedrails?: A Little Help needed moving to and from a bed to a chair (including a wheelchair)?: A Little Help needed standing up from a chair using your arms (e.g., wheelchair or bedside chair)?: A Little Help needed to walk in hospital room?: A Little Help needed climbing 3-5 steps with a railing? : A Little 6 Click Score: 18  End of Session   Activity Tolerance: Patient tolerated treatment well Patient left: in bed;with call bell/phone within reach   PT Visit Diagnosis: Difficulty in walking, not elsewhere classified (R26.2)     Time: 1587-2761 PT Time Calculation (min) (ACUTE ONLY): 9 min  Charges:  $Gait Training: 8-22 mins                        Doreatha Massed, PT Acute Rehabilitation  Office: [phone] Pager: [phone]

## 2020-01-23 LAB — SURGICAL PATHOLOGY

## 2020-01-23 LAB — CREATININE, FLUID (PLEURAL, PERITONEAL, JP DRAINAGE): Creat, Fluid: 1.2 mg/dL

## 2020-01-23 NOTE — TOC Progression Note (Signed)
Transition of Care [facility]) - Progression Note    Patient Details  Name: Pamela [name] MRN: [id number] Date of Birth: 03-Mar-1946  [facility] CM/SW Contact  Elfreda Blanchet, Marjie Skiff, RN Phone Number: 01/23/2020, 12:10 PM  Clinical Narrative:     [facility] consult for home health. This was set up on 7/16. Please see previous TOC note.

## 2020-01-23 NOTE — Consult Note (Signed)
[place] Nurse ostomy follow up Stoma type/location: RLQ, ileal conduit Stomal assessment/size: 1 1/2" budded, pink Peristomal assessment: NA Treatment options for stomal/peristomal skin: 2" barrier rings Output yellow urine Ostomy pouching: 1pc.convex with 2" barrier rings  Education provided:  In with patient to go over ordering supplies and answer questions.  Enrolled patient in [place] Start Discharge program: Yes  [place] Nurse will follow along with you for continued support with ostomy teaching and care [place] MSN, [place], [place], [place], [place]

## 2020-01-23 NOTE — Progress Notes (Signed)
6 Days Post-Op   Subjective/Chief Complaint:  1 - Bladder Cancer - s/p robotic cystectomy, hysterectomy, left oophorectomy, ICG sentinal + template pelvic node dissection, and conduit diverstion on 01/17/2020. Path penidng. Admitted 7/13 for stomal markging and bowel prep.Stepdown POD 0, transferred to med-surg floor POD 1. JP Cr ordered 7/19 and pending.   2 - Return of Bowel Function - NPO initially post-op. Received pre/post op entereg. Clears started POD 1. Return of bowel function POD 4 and advanced to regular diet.   3 - Disposition / Rehab - pt independent at baseline. PT recs HHPT. Will need at last [place] for new urostomy teaching / supplies post-op.  4-  Anticoagulation / F5 [place] - pt on chronic coumadin for F5 at baselie. Held pre-op. Lovenox started POD 1 40 QD, increased to 40 BID POD 2. Increased to full TX dose 80 BID POD 3.  Today "[place]" is progressing. Tollerating reg diet and ambulating. PT recs HHPT. JP Cr pending. Hgb stable on full dose lovenox.    Objective: Vital signs in last 24 hours: Temp:  [98.4 F (36.9 C)-98.8 F (37.1 C)] 98.4 F (36.9 C) (07/20 0548) Pulse Rate:  [80-91] 81 (07/20 0548) Resp:  [17-18] 17 (07/20 0548) BP: (148-156)/(74-86) 155/74 (07/20 0548) SpO2:  [95 %-96 %] 95 % (07/20 0548) Last BM Date: 01/21/20  Intake/Output from previous day: 07/19 0701 - 07/20 0700 In: -  Out: 820 [Urine:750; Drains:70] Intake/Output this shift: No intake/output data recorded.  General appearance: alert and cooperative Eyes: negative Nose: Nares normal. Septum midline. Mucosa normal. No drainage or sinus tenderness. Throat: lips, mucosa, and tongue normal; teeth and gums normal Neck: supple, symmetrical, trachea midline Back: symmetric, no curvature. ROM normal. No CVA tenderness. Resp: non-labored on RA Cardio: Reg rate GI: soft, non-tender; bowel sounds normal; no masses,  no organomegaly and RLQ urostomy pink and patent wti Rt (red) and lt  (blue) bander stents in situ.  Pelvic: external genitalia normal Extremities: extremities normal, atraumatic, no cyanosis or edema Skin: Skin color, texture, turgor normal. No rashes or lesions Lymph nodes: Cervical, supraclavicular, and axillary nodes normal. Neurologic: Grossly normal  Lab Results:  Recent Labs    01/21/20 0445 01/22/20 0500  HGB 10.0* 9.9*  HCT 31.6* 30.2*   BMET Recent Labs    01/21/20 0445 01/22/20 0500  NA 137 135  K 3.5 3.4*  CL 102 100  CO2 26 25  GLUCOSE 147* 104*  BUN 12 11  CREATININE 1.38* 1.20*  CALCIUM 7.5* 7.7*   PT/INR No results for input(s): LABPROT, INR in the last 72 hours. ABG No results for input(s): PHART, HCO3 in the last 72 hours.  Invalid input(s): PCO2, PO2  Studies/Results: No results found.  Anti-infectives: Anti-infectives (From admission, onward)   Start     Dose/Rate Route Frequency Ordered Stop   01/17/20 1800  piperacillin-tazobactam (ZOSYN) IVPB 3.375 g        3.375 g 12.5 mL/hr over 240 Minutes Intravenous Every 8 hours 01/17/20 1717 01/19/20 1437   01/17/20 0745  piperacillin-tazobactam (ZOSYN) IVPB 3.375 g        3.375 g 100 mL/hr over 30 Minutes Intravenous 30 min pre-op 01/16/20 1645 01/17/20 0913      Assessment/Plan:  CM consult to help arrange home health with goal DC tomorrow.  JP Cr to be sent by NSG. Discussed directly.  Continue reg diet, ambulation.  Appreciate PR and ostomy RN support.   Alexis Frock 01/23/2020

## 2020-01-23 NOTE — Progress Notes (Signed)
Physical Therapy Treatment Patient Details Name: Pamela [name] MRN: [id number] DOB: [birth year] Today's Date: 01/23/2020    History of Present Illness Patient is 74 y.o. 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 PMH significant for HTN, HLD, Glaucoma, hypothyroidism.    PT Comments    Patient is progressing well with mobility. Pt tolerated increased gait distance today with no device needed and no overt LOB noted. Patient educated on benefits of mobilizing and encouraged to meet goal of walking 3x/daily with RN staff to improve endurance. Acute PT will follow as able to address impairments.   Follow Up Recommendations  Home health PT     Equipment Recommendations  None recommended by PT    Recommendations for Other Services       Precautions / Restrictions Precautions Precautions: Fall Restrictions Weight Bearing Restrictions: No    Mobility  Bed Mobility Overal bed mobility: Needs Assistance Bed Mobility: Supine to Sit     Supine to sit: Modified independent (Device/Increase time)     General bed mobility comments: Mod I with HOB elevated and use of bed rails, some increased time needed to sit up.  Transfers Overall transfer level: Needs assistance Equipment used: None Transfers: Sit to/from Stand Sit to Stand: Min guard         General transfer comment: good carryover for safe technique with RW, light min assist to steady with rising.  Ambulation/Gait Ambulation/Gait assistance: Min guard   Assistive device: None Gait Pattern/deviations: Step-through pattern;Decreased stride length Gait velocity: fair   General Gait Details: pt mobilized with no device, no overt LOB noted. pt with improved gait velocity and increased tolerance to activity.    Stairs             Wheelchair Mobility     Modified Rankin (Stroke Patients Only)       Balance Overall balance assessment: Needs assistance Sitting-balance support: Feet supported Sitting balance-Leahy Scale: Normal     Standing balance support: During functional activity;No upper extremity supported Standing balance-Leahy Scale: Good               Cognition Arousal/Alertness: Awake/alert Behavior During Therapy: WFL for tasks assessed/performed Overall Cognitive Status: Within Functional Limits for tasks assessed                   Exercises      General Comments        Pertinent Vitals/Pain Pain Assessment: No/denies pain           PT Goals (current goals can now be found in the care plan section) Acute Rehab PT Goals Patient Stated Goal: get home PT Goal Formulation: With patient Time For Goal Achievement: 02/02/20 Potential to Achieve Goals: Good Progress towards PT goals: Progressing toward goals    Frequency    Min 3X/week      PT Plan Current plan remains appropriate       AM-PAC PT "6 Clicks" Mobility   Outcome Measure  Help needed turning from your back to your side while in a flat bed without using bedrails?: None Help needed moving from lying on your back to sitting on the side of a flat bed without using bedrails?: None Help needed moving to and from a bed to a chair (including a wheelchair)?: A Little Help needed standing up from a chair using your arms (e.g., wheelchair or bedside chair)?:  A Little Help needed to walk in hospital room?: A Little Help needed climbing 3-5 steps with a railing? : A Little 6 Click Score: 20    End of Session Equipment Utilized During Treatment: Gait belt Activity Tolerance: Patient tolerated treatment well Patient left: in chair;with call bell/phone within reach Nurse Communication: Mobility status PT Visit Diagnosis: Difficulty in walking, not elsewhere classified (R26.2)     Time: 1533-1550 PT Time Calculation (min) (ACUTE  ONLY): 17 min  Charges:  $Gait Training: 8-22 mins                     Verner Mould, [place]  Office [phone] Pager [phone]  01/23/2020 4:42 PM

## 2020-01-24 ENCOUNTER — Other Ambulatory Visit: Payer: Self-pay | Admitting: Pharmacist

## 2020-01-24 LAB — PROTIME-INR
INR: 1 (ref 0.8–1.2)
Prothrombin Time: 12.7 seconds (ref 11.4–15.2)

## 2020-01-24 MED ORDER — HYDROCODONE-ACETAMINOPHEN 5-325 MG PO TABS: 1.0000 | ORAL_TABLET | Freq: Four times a day (QID) | ORAL | 0 refills | Status: DC | PRN

## 2020-01-24 MED ORDER — ENOXAPARIN SODIUM 80 MG/0.8ML ~~LOC~~ SOLN: 80.0000 mg | Freq: Two times a day (BID) | SUBCUTANEOUS | 0 refills | Status: DC

## 2020-01-24 MED ORDER — SENNOSIDES-DOCUSATE SODIUM 8.6-50 MG PO TABS
1.0000 | ORAL_TABLET | Freq: Two times a day (BID) | ORAL | 0 refills | Status: DC
Start: 2020-01-24 — End: 2020-03-07

## 2020-01-24 NOTE — Progress Notes (Signed)
Have been discussing the patient's care relative to her anticoagulation management, which--as an outpatient, she receives within the [facility] on the [facility]. Patient has four (4) syringes at home that she can use to facilitate the bridging back to her oral VKA (warfarin). I will contact the patient today and recommence as an outpatient, her warfarin 4mg  PO daily + continued LMWH enoxaparin 80mg  SQ q12h. I will see her in our clinic on Monday (I will call our front desk to set the appointment for 10:00am on Monday 26-JUL-21) which will be a five (5) day overlap of concomitant warfarin+LMWH. I will send a prescription electronically to her pharmacy [facility]) for quantum sufficient syringes of enoxaparin to augment those she has at home. I will reinforce instructions regarding signs/symptoms of bleeding--that she should return to an ED [facility] or [facility]). Will discuss potential symptoms of VTE. Patient's last recorded VTE is remote--but with the oncologic disease as well as her surgery as risk factors, this plan seems prudent and consistent with risks vs. Benefits. I will engage her on these issues as a part of shared decision making. The patient has my contact information and I will be available to her 24/7 until Monday, 26-JUL-21 at 10:00 am. Thank you for your communication in the past 24h regarding transitioning her successfully from hospital to home.

## 2020-01-24 NOTE — Telephone Encounter (Signed)
Contacted patient to alert her she would need additional enoxaparin syringes after her discharge from [facility] today. Sending Rx to [facility].

## 2020-01-24 NOTE — Discharge Summary (Signed)
[facility] Discharge Summary  Admit date: 01/16/2020  Discharge date and time: 01/24/20   Discharge to: Home  Discharge Service: Urology  Discharge Attending Physician:  Alexis Frock, MD  Discharge  Diagnoses: Bladder cancer  Secondary Diagnosis: Active Problems:   Bladder cancer ([place])   OR Procedures: Procedure(s): XI ROBOTIC ASSISTED LAPAROSCOPIC COMPLETE CYSTECT ILEAL CONDUIT, XI ROBOTIC ASSISTED LAPAPRSCOPIC HYSTERECTOMY, LEFT OOPHERECTOMY,SALPINGECTOMY;LYMPHADENECTOMY CYSTOSCOPY WITH INJECTION 01/17/2020   Ancillary Procedures: None   Discharge Day Services: The patient was seen and examined by the Urology team both in the morning and immediately prior to discharge.  Vital signs and laboratory values were stable and within normal limits.  The physical exam was benign and unchanged and all surgical wounds were examined.  Discharge instructions were explained and all questions answered.  Subjective  No acute events overnight. Pain Controlled. No fever or chills. Tolerating regular diet. Having bowel movements.  Objective Patient Vitals for the past 8 hrs:  BP Temp Temp src Pulse Resp SpO2  01/24/20 0537 (!) 156/87 98.3 F (36.8 C) Oral 85 17 96 %   No intake/output data recorded.  General Appearance:        No acute distress Lungs:                       Normal work of breathing on room air Heart:                                Regular rate and rhythm Abdomen:                         Soft, non-tender, non-distended. Ostomy pink and healthy with tea colored urine in bag, bilateral stents in position Extremities:                      Warm and well perfused   Hospital Course:   1 - Bladder Cancer- s/p robotic cystectomy, hysterectomy, left oophorectomy, ICG sentinal + template pelvic node dissection, and conduit diverstion on 01/17/2020. Path penidng. Admitted 7/13 for stomal markging and bowel prep. Stepdown POD 0, transferred to med-surg floor POD 1. JP Cr  ordered 7/19, result 1.2. JP drain was removed on POD6.    2 - Return of Bowel Function- NPO initially post-op. Received pre/post op entereg. Clears started POD 1. Return of bowel function POD 4 and advanced to regular diet and entereg discontinued.   3 - Disposition / Rehab- pt independent at baseline. PT recs HHPT. HH with PT arranged by CM, and patient passed urostomy teaching with WOCN. Supplies ordered on discharge  4- Anticoagulation / F5 Leiden- pt on chronic coumadin for F5 at baselie. Held pre-op. Lovenox started POD 1 40 QD, increased to 40 BID on POD 2. Increased to full TX dose 80 BID POD 3. Patient discharged on 80mg  BID, with plans to follow up with pharmacist for transition back to coumadin.   The patient was discharged home 7 Days Post-Op, at which point she was tolerating a regular solid diet, having regular bowel movements, have adequate pain control with P.O. pain medication, and could ambulate without difficulty. The patient will follow up with [place] for post op check in about 2 weeks.   Condition at Discharge: Improved  Discharge Medications:  Allergies as of 01/24/2020      Reactions   Ace Inhibitors Cough  Medication List    TAKE these medications   acetaminophen 325 MG tablet Commonly known as: TYLENOL Take 325-650 mg by mouth daily as needed for moderate pain.   B-12 2500 MCG Tabs Take 2,500 mcg by mouth daily.   CALCIUM PO Take 1 tablet by mouth daily.   colestipol 1 g tablet Commonly known as: COLESTID TAKE 10 TABLETS BY MOUTH EVERY DAY What changed:   how much to take  how to take this  when to take this  additional instructions   enoxaparin 80 MG/0.8ML injection Commonly known as: LOVENOX Inject 0.8 mLs (80 mg total) into the skin every 12 (twelve) hours for 6 days. What changed: additional instructions   HYDROcodone-acetaminophen 5-325 MG tablet Commonly known as: Norco Take 1 tablet by mouth every 6 (six) hours as needed for  moderate pain or severe pain. Post-operatively What changed:   when to take this  reasons to take this  additional instructions   levothyroxine 88 MCG tablet Commonly known as: SYNTHROID TAKE ONE (1) TABLET BY MOUTH EVERY DAY What changed: See the new instructions.   lidocaine-prilocaine cream Commonly known as: EMLA Apply 1 application topically as needed. What changed:   when to take this  reasons to take this   losartan 25 MG tablet Commonly known as: COZAAR TAKE 1 TABLET BY MOUTH ONCE DAILY   magnesium oxide 400 MG tablet Commonly known as: MAG-OX Take 400 mg by mouth daily.   multivitamin with minerals Tabs tablet Take 1 tablet by mouth daily in the afternoon.   pantoprazole 40 MG tablet Commonly known as: PROTONIX TAKE 1 TABLET BY MOUTH ONCE DAILY   prochlorperazine 10 MG tablet Commonly known as: COMPAZINE Take 1 tablet (10 mg total) by mouth every 6 (six) hours as needed for nausea or vomiting.   senna-docusate 8.6-50 MG tablet Commonly known as: Senokot-S Take 1 tablet by mouth 2 (two) times daily. While taking strong pain meds to prevent constipation   VITAMIN C PO Take 1 tablet by mouth every evening.   VITAMIN D PO Take 1 capsule by mouth every evening.   warfarin 4 MG tablet Commonly known as: COUMADIN Take as directed. If you are unsure how to take this medication, talk to your nurse or doctor. Original instructions: TAKE ONE TABLET BY MOUTH ON MONDAYS AND FRIDAYS AND TAKE ONE-HALF TABLET ALL OTHER DAYS

## 2020-01-25 ENCOUNTER — Telehealth: Payer: Self-pay

## 2020-01-25 NOTE — Telephone Encounter (Signed)
Per Joellen Jersey [facility]

## 2020-01-25 NOTE — Telephone Encounter (Signed)
Rtc, wants to discuss labs w/ dr Elie Confer, she will call him

## 2020-01-29 ENCOUNTER — Telehealth: Payer: Self-pay | Admitting: Pharmacist

## 2020-01-29 ENCOUNTER — Telehealth: Payer: Self-pay

## 2020-01-29 ENCOUNTER — Ambulatory Visit: Payer: Medicare Other

## 2020-01-29 NOTE — Telephone Encounter (Signed)
Received VM from Hana (unable to understand first name), requesting verbal orders for PT 2 times per week X 3 weeks then once a week X 5 weeks. Per Dr Alen Blew, it is OK to give these verbal orders. LVM for PT-[place] for pt.

## 2020-01-29 NOTE — Telephone Encounter (Signed)
[facility] RN called at 1016h reporting FS POC INR 1.3 (target 2.0 - 3.0), this--after reinitiation of warfarin + enoxaparin 1mg /kg SQ q12 last week on day of discharge from [facility]. Will CONTINUE 1mg /kg SQ q12h enoxaparin with warfarin--increasing dose to 6mg  (1 and 1/2 x 4mg ) for three consecutive days; 4mg  on fourth-day; then INR on 5th day (this Friday, 30-JUL-21). Patient on audio did read-back. Daughter is going to [facility] to pick up more enoxaparin syringes that I transmitted last week on her day of discharge from [facility]. No reported bleeding by RN's assessment or by the patient. No symptoms of VTE.

## 2020-01-29 NOTE — Telephone Encounter (Signed)
I agree with Dr. Groce's recommendations. 

## 2020-02-02 ENCOUNTER — Telehealth: Payer: Self-pay | Admitting: Pharmacist

## 2020-02-02 NOTE — Telephone Encounter (Signed)
[facility] RN Demetrios Isaacs called results of FS POC INR 4.4 after three sucessive doses of 6mg  warfarin + 1mg /kg SQ q12h enoxaparin in response to last called INR of 1.3. There has been blood tinged urine in collection bag. Warfarin is being HELD/OMITTED today, tomorrow (Friday, 30-JUL-21 and Saturday 31-JUL-21); recommenced on Sunday 1-AUG-21 with the [facility] RN returning to the patient's home on Monday 2-AUG-21 for a repeat PT/INR. Though I will be on PAL 2-AUG-21 to 9-AUG-21, I have provided my number for the [facility] RN and or the patient to text me/call me PRN. Patient was advised to report to the ED or call her urologic surgeon if gross hematuria were to occur or stay sustained. Enoxaparin is being discontinued.

## 2020-02-02 NOTE — Telephone Encounter (Signed)
I agree with Dr. Gladstone Pih plan as documented.

## 2020-02-07 ENCOUNTER — Other Ambulatory Visit: Payer: Self-pay | Admitting: *Deleted

## 2020-02-09 MED ORDER — LEVOTHYROXINE SODIUM 88 MCG PO TABS: 88.0000 ug | ORAL_TABLET | Freq: Every day | ORAL | 0 refills | Status: DC

## 2020-02-09 NOTE — Telephone Encounter (Signed)
Pt states the pharmacy is waiting from the clinic to reply back on levothyroxine (SYNTHROID) 88 MCG tablet. Please call pt back.

## 2020-02-12 ENCOUNTER — Telehealth: Payer: Self-pay | Admitting: Pharmacist

## 2020-02-12 NOTE — Telephone Encounter (Signed)
[facility] RN called with POC FS INR = 3.9 after omitted doeses and a reduced re-start yesterday (2mg ). Will OMIT today's dose; 1/2 x 4mg  (2mg  dose) on Tu/We/Fr/Sa; 1x4mg  (4mg  dose) on Th/Su. Repeat FS INR 16-AUG-21. No bleeding.

## 2020-02-19 ENCOUNTER — Telehealth: Payer: Self-pay | Admitting: Pharmacist

## 2020-02-19 NOTE — Telephone Encounter (Signed)
[facility] RN calls with POC FS INR from today = 2.8. Will continue Su-4mg M-2mg T-2mg W-2mg Th-4mg F-2mg Sa-2mg  and repeat INR 30-AUG-21. No bleeding/signs or symptoms of VTE per RN assessment.

## 2020-02-26 ENCOUNTER — Telehealth: Payer: Self-pay | Admitting: Student-PharmD

## 2020-02-26 NOTE — Telephone Encounter (Signed)
Patient's INR today is 1.8. She denies any signs/symptoms of bleeding, missing any doses, and changes in medications/diet. Goal INR is 2-3. Will adjust weekly dosage of warfarin from 18mg  to 20mg  per week with 1 full tablet on Monday/Wednesday/Friday and 1/2 tablet on all other days. Will follow up with next INR check in a week on Monday 8/30.

## 2020-02-27 NOTE — Telephone Encounter (Signed)
Agree 

## 2020-02-29 ENCOUNTER — Other Ambulatory Visit: Payer: Self-pay

## 2020-02-29 ENCOUNTER — Inpatient Hospital Stay: Payer: Medicare Other | Attending: Oncology | Admitting: Oncology

## 2020-02-29 ENCOUNTER — Inpatient Hospital Stay: Payer: Medicare Other

## 2020-02-29 ENCOUNTER — Telehealth: Payer: Self-pay

## 2020-02-29 ENCOUNTER — Encounter: Payer: Medicare Other | Admitting: Internal Medicine

## 2020-02-29 VITALS — BP 166/72 | HR 72 | Temp 97.1°F | Resp 18 | Ht 64.0 in | Wt 176.6 lb

## 2020-02-29 DIAGNOSIS — Z95828 Presence of other vascular implants and grafts: Secondary | ICD-10-CM | POA: Diagnosis not present

## 2020-02-29 DIAGNOSIS — Z9221 Personal history of antineoplastic chemotherapy: Secondary | ICD-10-CM | POA: Diagnosis not present

## 2020-02-29 DIAGNOSIS — Z86718 Personal history of other venous thrombosis and embolism: Secondary | ICD-10-CM | POA: Insufficient documentation

## 2020-02-29 DIAGNOSIS — Z8551 Personal history of malignant neoplasm of bladder: Secondary | ICD-10-CM | POA: Insufficient documentation

## 2020-02-29 DIAGNOSIS — C679 Malignant neoplasm of bladder, unspecified: Secondary | ICD-10-CM

## 2020-02-29 DIAGNOSIS — Z7901 Long term (current) use of anticoagulants: Secondary | ICD-10-CM | POA: Diagnosis not present

## 2020-02-29 LAB — CBC WITH DIFFERENTIAL (CANCER CENTER ONLY)
Abs Immature Granulocytes: 0.04 10*3/uL (ref 0.00–0.07)
Basophils Absolute: 0.1 10*3/uL (ref 0.0–0.1)
Basophils Relative: 1 %
Eosinophils Absolute: 0.3 10*3/uL (ref 0.0–0.5)
Eosinophils Relative: 4 %
HCT: 29.8 % — ABNORMAL LOW (ref 36.0–46.0)
Hemoglobin: 9.7 g/dL — ABNORMAL LOW (ref 12.0–15.0)
Immature Granulocytes: 1 %
Lymphocytes Relative: 23 %
Lymphs Abs: 1.4 10*3/uL (ref 0.7–4.0)
MCH: 32.3 pg (ref 26.0–34.0)
MCHC: 32.6 g/dL (ref 30.0–36.0)
MCV: 99.3 fL (ref 80.0–100.0)
Monocytes Absolute: 0.7 10*3/uL (ref 0.1–1.0)
Monocytes Relative: 11 %
Neutro Abs: 3.7 10*3/uL (ref 1.7–7.7)
Neutrophils Relative %: 60 %
Platelet Count: 307 10*3/uL (ref 150–400)
RBC: 3 MIL/uL — ABNORMAL LOW (ref 3.87–5.11)
RDW: 13.2 % (ref 11.5–15.5)
WBC Count: 6.1 10*3/uL (ref 4.0–10.5)
nRBC: 0 % (ref 0.0–0.2)

## 2020-02-29 LAB — CMP (CANCER CENTER ONLY)
ALT: 7 U/L (ref 0–44)
AST: 16 U/L (ref 15–41)
Albumin: 3.1 g/dL — ABNORMAL LOW (ref 3.5–5.0)
Alkaline Phosphatase: 67 U/L (ref 38–126)
Anion gap: 7 (ref 5–15)
BUN: 17 mg/dL (ref 8–23)
CO2: 26 mmol/L (ref 22–32)
Calcium: 9.2 mg/dL (ref 8.9–10.3)
Chloride: 105 mmol/L (ref 98–111)
Creatinine: 1.52 mg/dL — ABNORMAL HIGH (ref 0.44–1.00)
GFR, Est AFR Am: 38 mL/min — ABNORMAL LOW (ref >60)
GFR, Estimated: 33 mL/min — ABNORMAL LOW (ref >60)
Glucose, Bld: 96 mg/dL (ref 70–99)
Potassium: 4.3 mmol/L (ref 3.5–5.1)
Sodium: 138 mmol/L (ref 135–145)
Total Bilirubin: 0.5 mg/dL (ref 0.3–1.2)
Total Protein: 6.4 g/dL — ABNORMAL LOW (ref 6.5–8.1)

## 2020-02-29 LAB — MAGNESIUM: Magnesium: 1 mg/dL — CL (ref 1.7–2.4)

## 2020-02-29 MED ORDER — HEPARIN SOD (PORK) LOCK FLUSH 100 UNIT/ML IV SOLN
500.0000 [IU] | Freq: Once | INTRAVENOUS | Status: AC
  Administered 2020-02-29: 500 [IU]
  Filled 2020-02-29: qty 5

## 2020-02-29 MED ORDER — SODIUM CHLORIDE 0.9% FLUSH
10.0000 mL | Freq: Once | INTRAVENOUS | Status: AC
  Administered 2020-02-29: 10 mL
  Filled 2020-02-29: qty 10

## 2020-02-29 NOTE — Progress Notes (Signed)
Hematology and Oncology Follow Up Visit  Pamela [name] [id number] Oct 19, 1945 74 y.o. 02/29/2020 8:44 AM Pamela [name] Pamela [name], MDButcher, Real Cons, MD   Principle Diagnosis: 74 year old with bladder cancer diagnosed in February 2021.  She was found to T2N0 high-grade urothelial carcinoma.    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  Neoadjuvant chemotherapy with gemcitabine and cisplatin started on March 24th 2021.   She completed 3 cycles of therapy on Nov 14, 2019.  She is status post robotic assisted laparoscopic cystectomy and lymphadenectomy completed on January 17, 2020.  She had a complete response to therapy with no residual disease on surgery.  Current therapy: Active surveillance.  Interim History: [name] is here for a follow-up visit.  Since the last visit, she underwent a surgical resection of her bladder which she has tolerated reasonably well.  She had denies any abdominal pain, discomfort, hematuria or dysuria.  She denies any flank pain or shortness of breath.  Her performance status quality of life remained excellent.     Medications: Unchanged on review. Current Outpatient Medications  Medication Sig Dispense Refill  . acetaminophen (TYLENOL) 325 MG tablet Take 325-650 mg by mouth daily as needed for moderate pain.    . Ascorbic Acid (VITAMIN C PO) Take 1 tablet by mouth every evening.    [name] CALCIUM PO Take 1 tablet by mouth daily.     . colestipol (COLESTID) 1 g tablet TAKE 10 TABLETS BY MOUTH EVERY DAY (Patient taking differently: Take 6 g by mouth daily at 12 noon. ) 900 tablet 3  . Cyanocobalamin (B-12) 2500 MCG TABS Take 2,500 mcg by mouth daily.    [name] enoxaparin (LOVENOX) 80 MG/0.8ML injection Inject 0.8 mLs (80 mg total) into the skin every 12 (twelve) hours for 6 days. 10 mL 0  . HYDROcodone-acetaminophen (NORCO) 5-325 MG tablet Take 1 tablet by mouth every 6 (six)  hours as needed for moderate pain or severe pain. Post-operatively 15 tablet 0  . levothyroxine (SYNTHROID) 88 MCG tablet Take 1 tablet (88 mcg total) by mouth daily before breakfast. 90 tablet 0  . lidocaine-prilocaine (EMLA) cream Apply 1 application topically as needed. (Patient taking differently: Apply 1 application topically daily as needed (port access). ) 30 g 0  . losartan (COZAAR) 25 MG tablet TAKE 1 TABLET BY MOUTH ONCE DAILY (Patient taking differently: Take 25 mg by mouth daily. ) 90 tablet 3  . magnesium oxide (MAG-OX) 400 MG tablet Take 400 mg by mouth daily.    . Multiple Vitamin (MULTIVITAMIN WITH MINERALS) TABS tablet Take 1 tablet by mouth daily in the afternoon.    . pantoprazole (PROTONIX) 40 MG tablet TAKE 1 TABLET BY MOUTH ONCE DAILY (Patient taking differently: Take 40 mg by mouth daily. ) 90 tablet 3  . prochlorperazine (COMPAZINE) 10 MG tablet Take 1 tablet (10 mg total) by mouth every 6 (six) hours as needed for nausea or vomiting. 30 tablet 0  . senna-docusate (SENOKOT-S) 8.6-50 MG tablet Take 1 tablet by mouth 2 (two) times daily. While taking strong pain meds to prevent constipation 10 tablet 0  . VITAMIN D PO Take 1 capsule by mouth every evening.    . warfarin (COUMADIN) 4 MG tablet TAKE ONE TABLET BY MOUTH ON MONDAYS AND FRIDAYS AND TAKE ONE-HALF TABLET ALL OTHER DAYS 65 tablet 10   No current facility-administered medications for this visit.   Facility-Administered Medications Ordered  in Other Visits  Medication Dose Route Frequency Provider Last Rate Last Admin  . heparin lock flush 100 unit/mL  500 Units Intracatheter Once Pamela Portela, MD      . sodium chloride flush (NS) 0.9 % injection 10 mL  10 mL Intracatheter Once Pamela Portela, MD         Allergies:  Allergies  Allergen Reactions  . Ace Inhibitors Cough      Physical Exam:  Blood pressure (!) 166/72, pulse 72, temperature (!) 97.1 F (36.2 C), temperature source Tympanic, resp. rate 18,  height 5\' 4"  (1.626 m), weight 176 lb 9.6 oz (80.1 kg), SpO2 98 %.    ECOG: 1    General appearance: Comfortable appearing without any discomfort Head: Normocephalic without any trauma Oropharynx: Mucous membranes are moist and pink without any thrush or ulcers. Eyes: Pupils are equal and round reactive to light. Lymph nodes: No cervical, supraclavicular, inguinal or axillary lymphadenopathy.   Heart:regular rate and rhythm.  S1 and S2 without leg edema. Lung: Clear without any rhonchi or wheezes.  No dullness to percussion. Abdomin: Soft, nontender, nondistended with good bowel sounds.  No hepatosplenomegaly. Musculoskeletal: No joint deformity or effusion.  Full range of motion noted. Neurological: No deficits noted on motor, sensory and deep tendon reflex exam. Skin: No petechial rash or dryness.  Appeared moist.        Lab Results: Lab Results  Component Value Date   WBC 7.3 01/16/2020   HGB 9.9 (L) 01/22/2020   HCT 30.2 (L) 01/22/2020   MCV 109.5 (H) 01/16/2020   PLT 230 01/16/2020     Chemistry      Component Value Date/Time   NA 135 01/22/2020 0500   NA 143 06/13/2019 1520   K 3.4 (L) 01/22/2020 0500   CL 100 01/22/2020 0500   CO2 25 01/22/2020 0500   BUN 11 01/22/2020 0500   BUN 15 06/13/2019 1520   CREATININE 1.20 (H) 01/22/2020 0500   CREATININE 1.37 (H) 11/28/2019 0843   CREATININE 0.78 12/13/2014 0954      Component Value Date/Time   CALCIUM 7.7 (L) 01/22/2020 0500   ALKPHOS 49 01/16/2020 1738   AST 21 01/16/2020 1738   AST 21 11/28/2019 0843   ALT 12 01/16/2020 1738   ALT 12 11/28/2019 0843   BILITOT 1.0 01/16/2020 1738   BILITOT 0.5 11/28/2019 0843        Impression and Plan:   74 year old woman with:  1.    High-grade urothelial carcinoma with glandular and small cell component diagnosed in February 2021.  She found to have no residual disease after radical cystectomy and chemotherapy completed in July 2021.     The natural course  of this disease at this time reviewed risk of relapse of sepsis.  Her prognosis was overall would be excellent given her complete response to therapy.  We will continue to receive active surveillance with imaging studies including abdomen and pelvis as well as chest x-ray.  She is currently following with Dr. Tresa Moore regarding this issue.   2. IV access: Port-A-Cath remains in place and risks and benefits of removing it were discussed today.  She will continue to have a Port-A-Cath flush every 2 months and consider removing it in 6 months.  3. Antiemetics: No issues reported with nausea or vomiting.  4. Renal function surveillance: Creatinine clearance remains stable at this time.  5. Goals of care:Her disease is curable at this time and aggressive measures are warranted.  6.  History of recurrent thrombosis: Warfarin has been resumed at this time.   7.  Anemia: Hemoglobin improving at this time after surgery and chemotherapy.  8. Follow-up: She will return in 6 months for repeat evaluation.  30  minutes were spent on this encounter.  Time was dedicated to reviewing her disease status, discussing pathology reports and future plan of care review.      Zola Button, MD 8/26/20218:44 AM

## 2020-02-29 NOTE — Patient Instructions (Signed)

## 2020-02-29 NOTE — Telephone Encounter (Signed)
CRITICAL VALUE STICKER  CRITICAL VALUE: Magnesium 1.0  RECEIVER (on-site recipient of call): Lenox Ponds LPN  DATE & TIME NOTIFIED: 02/29/20 1010  MESSENGER (representative from lab): Cher Nakai   MD NOTIFIED: Dr. Alen Blew  TIME OF NOTIFICATION: 0454  RESPONSE:Dr. Aware of result

## 2020-03-04 ENCOUNTER — Telehealth: Payer: Self-pay | Admitting: Pharmacist

## 2020-03-04 ENCOUNTER — Telehealth: Payer: Self-pay | Admitting: Oncology

## 2020-03-04 NOTE — Telephone Encounter (Signed)
Agree with plan 

## 2020-03-04 NOTE — Telephone Encounter (Signed)
[facility] RN calls with POC FS INR value of 2.3 (goal 2.0 - 3.0) RN reports no symptoms/signs of bleeding. No signs of VTE. Patient has adequate supply of warfarin on hand. Will repeat in 10 days.

## 2020-03-04 NOTE — Telephone Encounter (Signed)
Scheduled per 08/26 los, patient has been called and notified.  

## 2020-03-04 NOTE — Telephone Encounter (Signed)
See earlier phone documentation.

## 2020-03-05 ENCOUNTER — Encounter: Payer: Self-pay | Admitting: Internal Medicine

## 2020-03-05 NOTE — Progress Notes (Signed)
Established Patient Office Visit  Subjective:  Patient ID: Pamela [name], female    DOB: [birth year]  Age: 74 y.o. MRN: [id number]  CC: "I'm feeling ok, except I'm having problems sleeping"  HPI Pamela [name] presents for f/u of hypothyroid management (due for annual TSH) and other chronic conditions, and to review her recent cancer treatment and recovery.  Pamela [name] was diagnosed earlier this year with bladder cancer, and in the interim has undergone chemotherapy, radiation, and surgery with curative intent.  After radical cystectomy and hysterectomy, she has an ileal conduit urostomy, and will soon have her 2nd stent removed. Her portacath remains.  Remarkably, she has no residual discomfort, and feels like her energy is returning.  More limiting is ongoing right knee OA pain, which during the last year was to have been treated with TKR, sidelined due to the Covid pandemic followed by her cancer diagnosis.  She still plans to follow-up with her orthopedist to get this scheduled at some point.  Her most pressing complaint is difficulty sleeping.  She reports having had insomnia for many years, though over the past several weeks has had more difficulty both initiating sleep and with early morning awakening.  She maintains a cool quiet sleep environment (though there is a disturbing street light that shines through her window, and she is not able to wear a sleep mask).  She is not experiencing pain or anxiety.  When she awakens in the middle of the night, she feels alert and will often read or do tasks until she feels she might be able to return to sleep.  She did not get to sleep until about 5 AM this morning, and had to awaken after couple of hours to come to today's appointment.  Desirable sleep time would be 9 or 10 PM.  She feels tired the following day.  She has not taken any medications or self treated in any way.  Cholestasis diarrhea has improved.  Blood pressures have been  well controlled in her home environment taken by [facility] RN.  See problem based documentation for more detailed review of all problems.  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  She has lost some weight related to her cancer treatment, though this is not unexpected and can be monitored.  Objective:    Physical Exam  There were no vitals taken for this visit. Wt Readings from Last 3 Encounters:  02/29/20 176 lb 9.6 oz (80.1 kg)  01/17/20 182 lb 8.7 oz (82.8 kg)  11/28/19 184 lb 4.8 oz (83.6 kg)  Pamela [name] has a well appearance which belies the significant health problems she has experienced over the recent several months.  She is alert, conversant, well versed in her medical history, and has a positive affect.  Heart regular rate and rhythm with no murmur, lungs clear to the bases.  She has a right lower quadrant urostomy bag with healthy appearing stoma.  No leakage.  No lower extremity edema.  The right knee has painless crepitus with extension and flexion.  There  is no palpable effusion or increased warmth.  There is tenderness over the joint lines bilaterally.  She walks very with a very mild limp favoring the right.  Lab Results  Component Value Date   TSH 0.772 03/02/2019   Lab Results  Component Value Date   WBC 6.1 02/29/2020   HGB 9.7 (L) 02/29/2020   HCT 29.8 (L) 02/29/2020   MCV 99.3 02/29/2020   PLT 307 02/29/2020   Lab Results  Component Value Date   NA 138 02/29/2020   K 4.3 02/29/2020   CO2 26 02/29/2020   GLUCOSE 96 02/29/2020   BUN 17 02/29/2020   CREATININE 1.52 (H) 02/29/2020   BILITOT 0.5 02/29/2020   ALKPHOS 67 02/29/2020   AST 16 02/29/2020   ALT 7 02/29/2020   PROT 6.4 (L) 02/29/2020   ALBUMIN 3.1 (L) 02/29/2020   CALCIUM 9.2 02/29/2020   ANIONGAP 7 02/29/2020     Assessment & Plan:   See problem based charting.  Overall Pamela [name] is doing remarkably well.  We reviewed her medication list and eliminated those she is no longer requiring.  She has completed her cancer treatment except for the removal of one of her ureteral stents and the eventual removal of her Port-A-Cath.  Although not pursued today, we will plan to administer the Zostrix series this year.  She is not due for any other health maintenance interventions at this time.  New problem of exacerbated insomnia involves both sleep latency and premature awakening associated with tiredness the following day.  No exacerbating factors were identified except for the light shining through her window.  We discussed chamomile and valerian tea (she will not need to worry about nighttime urination as she has an ostomy), and OTC melatonin up to 3 mg nightly.  If this is ineffective, we will readdress.  Follow-up: F/u 69M or sooner if needed.    Angelica Pou, MD

## 2020-03-07 ENCOUNTER — Other Ambulatory Visit: Payer: Self-pay

## 2020-03-07 ENCOUNTER — Ambulatory Visit (INDEPENDENT_AMBULATORY_CARE_PROVIDER_SITE_OTHER): Payer: Medicare Other | Admitting: Internal Medicine

## 2020-03-07 ENCOUNTER — Encounter: Payer: Self-pay | Admitting: Internal Medicine

## 2020-03-07 VITALS — BP 144/72 | HR 78 | Temp 98.4°F | Ht 64.0 in | Wt 176.7 lb

## 2020-03-07 DIAGNOSIS — E039 Hypothyroidism, unspecified: Secondary | ICD-10-CM | POA: Diagnosis not present

## 2020-03-07 DIAGNOSIS — Z23 Encounter for immunization: Secondary | ICD-10-CM

## 2020-03-07 DIAGNOSIS — K219 Gastro-esophageal reflux disease without esophagitis: Secondary | ICD-10-CM | POA: Diagnosis not present

## 2020-03-07 DIAGNOSIS — K9089 Other intestinal malabsorption: Secondary | ICD-10-CM

## 2020-03-07 DIAGNOSIS — C689 Malignant neoplasm of urinary organ, unspecified: Secondary | ICD-10-CM

## 2020-03-07 DIAGNOSIS — Z7189 Other specified counseling: Secondary | ICD-10-CM

## 2020-03-07 DIAGNOSIS — I10 Essential (primary) hypertension: Secondary | ICD-10-CM

## 2020-03-07 DIAGNOSIS — D6869 Other thrombophilia: Secondary | ICD-10-CM

## 2020-03-07 DIAGNOSIS — E538 Deficiency of other specified B group vitamins: Secondary | ICD-10-CM

## 2020-03-07 DIAGNOSIS — F5104 Psychophysiologic insomnia: Secondary | ICD-10-CM

## 2020-03-07 DIAGNOSIS — D649 Anemia, unspecified: Secondary | ICD-10-CM

## 2020-03-07 DIAGNOSIS — M1711 Unilateral primary osteoarthritis, right knee: Secondary | ICD-10-CM

## 2020-03-07 DIAGNOSIS — M8580 Other specified disorders of bone density and structure, unspecified site: Secondary | ICD-10-CM

## 2020-03-08 ENCOUNTER — Telehealth: Payer: Self-pay | Admitting: *Deleted

## 2020-03-08 ENCOUNTER — Other Ambulatory Visit: Payer: Self-pay | Admitting: Oncology

## 2020-03-08 DIAGNOSIS — F5104 Psychophysiologic insomnia: Secondary | ICD-10-CM | POA: Insufficient documentation

## 2020-03-08 LAB — VITAMIN B12: Vitamin B-12: 658 pg/mL (ref 232–1245)

## 2020-03-08 LAB — TSH: TSH: 0.997 u[IU]/mL (ref 0.450–4.500)

## 2020-03-08 MED ORDER — MAGNESIUM OXIDE 400 (241.3 MG) MG PO TABS
400.0000 mg | ORAL_TABLET | Freq: Every day | ORAL | 2 refills | Status: DC
Start: 2020-03-08 — End: 2020-03-08

## 2020-03-08 MED ORDER — PANTOPRAZOLE SODIUM 20 MG PO TBEC: 20.0000 mg | DELAYED_RELEASE_TABLET | Freq: Every day | ORAL | 1 refills | Status: DC

## 2020-03-08 MED ORDER — COLESTIPOL HCL 1 G PO TABS: ORAL_TABLET | ORAL | 3 refills | Status: DC

## 2020-03-08 NOTE — Assessment & Plan Note (Signed)
Due for annual TSH today.  No symptoms of over or underactive thyroid function at this time.

## 2020-03-08 NOTE — Assessment & Plan Note (Addendum)
F/u cbc and B12 today

## 2020-03-08 NOTE — Telephone Encounter (Signed)
Per Dr.Shadad, called to make pt aware that her Mag was low and a Rx was sent to her preferred pharmacy. Advised if there were an concerns to call office. Pt verbalized understanding and was appreciative.

## 2020-03-08 NOTE — Assessment & Plan Note (Signed)
Right knee pain is limiting distance walking.  She is not taking anything for the discomfort, and I've advised to use Tylenol up to 1 g 3 times daily.  She does not feel instability and has not had swelling.  Her last injection was early in July 2021.  Until Covid and new diagnosis of bladder cancer, she was anticipating undergoing a right knee replacement.  She will continue to follow-up with Dr. Percell Miller.

## 2020-03-08 NOTE — Assessment & Plan Note (Signed)
Has been on PPI 40 mg daily for years.  We discussed potential complications of osteopenia and osteoporosis.  She is currently asymptomatic (nighttime cough).  She agrees to tapering off and replacing with an H2 B.

## 2020-03-08 NOTE — Assessment & Plan Note (Signed)
Malabsorption diarrhea has improved for some reason following her bladder cancer treatment.  She has been taking 6 or fewer tablets a day, as needed, and frequency has decreased.  She does not feel that Metformin is the cause of any of her diarrhea episodes, as she has always tolerated it well.  In general, she is advised to continue working towards cessation if at all possible (she may not actually need this any longer).

## 2020-03-08 NOTE — Assessment & Plan Note (Signed)
We will review her vitamin D status.  Ongoing risk is PPI for nighttime cough GERD.  She agrees to de-escalating.

## 2020-03-08 NOTE — Assessment & Plan Note (Signed)
New problem of exacerbated insomnia involves both sleep latency and premature awakening associated with tiredness the following day.  No exacerbating factors were identified except for the light shining through her window.  We discussed chamomile and valerian tea (she will not need to worry about nighttime urination as she has an ostomy), and OTC melatonin up to 3 mg nightly.  If this is ineffective, we will readdress.

## 2020-03-08 NOTE — Assessment & Plan Note (Addendum)
SBPs recent clinic visits have been greater than 130, though at home typically are less than 130 as taken by home health nurse.  Continue losartan 25 mg daily.

## 2020-03-08 NOTE — Assessment & Plan Note (Signed)
She is feeling remarkably well, with no discomfort associated with recent surgery, tolerating her urostomy bag.  She will have the second stent removed soon.  She is ecstatic that the word "cure" has been used.  Will discontinue the expired chemotherapy-related and surgery-related medications which she is no longer using.

## 2020-03-08 NOTE — Assessment & Plan Note (Signed)
Patient states that she stopped taking vitamin B12 a long time ago.  Follow-up level today, resolve problem if it remains replete.

## 2020-03-08 NOTE — Assessment & Plan Note (Signed)
No thromboembolic events. Not interested in [place]. Coumadin managed in anticoagulation clinic.  No adverse events.

## 2020-03-14 ENCOUNTER — Telehealth: Payer: Self-pay | Admitting: Pharmacist

## 2020-03-14 NOTE — Telephone Encounter (Signed)
[facility] RN at the patient's home--perfomred FS POC INR = 3.2 (target 2.0 - 3.0). Did take 2 days of cephalexin around urinary stent removal. This INR on 20mg  warfarin/wk. Will reduce to 18mg  warfarin/wk. Repeat INR in one week. No bleeding reported by RN.

## 2020-03-14 NOTE — Telephone Encounter (Signed)
Agree with plan 

## 2020-03-22 ENCOUNTER — Telehealth: Payer: Self-pay | Admitting: Pharmacist

## 2020-03-22 NOTE — Telephone Encounter (Signed)
[facility] RN calls POC FS INR result of 2.8 (target 2.0 - 3.0) on 18mg  warfarin/wk. Will CONTINUE same regimen and recollect on 30SEP21. No bleeding per RN assessment.

## 2020-03-27 NOTE — Telephone Encounter (Signed)
Agree, thank you

## 2020-04-09 ENCOUNTER — Encounter: Payer: Self-pay | Admitting: Internal Medicine

## 2020-04-09 DIAGNOSIS — C689 Malignant neoplasm of urinary organ, unspecified: Secondary | ICD-10-CM

## 2020-04-09 NOTE — Assessment & Plan Note (Signed)
F/u visit with urologist Dr. Alen Blew 03/12/20:  2nd stent removed, excellent prognosis.  Plan for urology f/u in 4 m for CMP, CT abd/pelvis, and CXR.

## 2020-04-18 ENCOUNTER — Telehealth: Payer: Self-pay | Admitting: Pharmacist

## 2020-04-18 NOTE — Telephone Encounter (Signed)
Thank you. agree 

## 2020-04-18 NOTE — Telephone Encounter (Signed)
Pamela Neither, RN / [facility] Provider visiting for post-op care (last visit) with results of FS POC INR = 4.9 (Goal 2.0 - 3.0) on 16mg  warfarin/week. Has already taken today's dose. WIll OMIT tomorrows dose and reduce weekly dosage to 14mg /wk warfarin. RTC 18-OCT-21. No bleeding per RN assessment. Patient DID receive a "hydrocortisone" injection into her knee last Wednesday. Steroids may cause a hypoprothrombinemic response. No new medications other than this one-time intra-articular injection 7 days ago. No antibiotics, no weight loss, etc. No bleeding. Since she has already taken today's dose prior to the [facility] RN arrival for INR--must omit tomorrow's dose (one time omitted dose only).

## 2020-04-24 ENCOUNTER — Other Ambulatory Visit (HOSPITAL_COMMUNITY): Payer: Self-pay | Admitting: Internal Medicine

## 2020-04-24 ENCOUNTER — Ambulatory Visit: Payer: Medicare Other | Attending: Internal Medicine

## 2020-04-24 DIAGNOSIS — Z23 Encounter for immunization: Secondary | ICD-10-CM

## 2020-04-24 NOTE — Progress Notes (Signed)
   Covid-19 Vaccination Clinic  Name:  Pamela [name]    MRN: [id number] DOB: 1946/01/14  04/24/2020  Pamela [name] was observed post Covid-19 immunization for 15 minutes without incident. She was provided with Vaccine Information Sheet and instruction to access the V-Safe system.   Pamela [name] was instructed to call 911 with any severe reactions post vaccine: [name] Difficulty breathing  . Swelling of face and throat  . A fast heartbeat  . A bad rash all over body  . Dizziness and weakness

## 2020-04-29 ENCOUNTER — Other Ambulatory Visit: Payer: Self-pay

## 2020-04-29 ENCOUNTER — Ambulatory Visit: Payer: Medicare Other | Admitting: Pharmacist

## 2020-04-29 DIAGNOSIS — D6869 Other thrombophilia: Secondary | ICD-10-CM

## 2020-04-29 LAB — POCT INR: INR: 3.9 — AB (ref 2.0–3.0)

## 2020-04-29 NOTE — Progress Notes (Signed)
Anticoagulation Management Pamela [name] is a 74 y.o. female who reports to the clinic for monitoring of warfarin treatment.    Indication: Secondary hypercoagulable state; DVT, Histrory of (resolved); Long term current use of anticoagulant warfarin.    Duration: indefinite Supervising physician: [facility] Visit History: Patient does not report signs/symptoms of bleeding or thromboembolism  Other recent changes: No diet, medications, lifestyle changes cited by the patient.  Anticoagulation Episode Summary    Current INR goal:  2.0-3.0  TTR:  68.9 % (9.3 y)  Next INR check:  05/20/2020  INR from last check:  3.9 (04/29/2020)  Weekly max warfarin dose:    Target end date:  Indefinite  INR check location:  Anticoagulation Clinic  Preferred lab:    Send INR reminders to:  ANTICOAG IMP   Indications   Secondary hypercoagulable state ([place]) [D68.69] DVT HX OF (Resolved) [F64.332]       Comments:        Anticoagulation Care Providers    Provider Role Specialty Phone number   Burman Freestone, MD  Internal Medicine [phone]      Allergies  Allergen Reactions  . Ace Inhibitors Cough    Current Outpatient Medications:  .  acetaminophen (TYLENOL) 325 MG tablet, Take 325-650 mg by mouth daily as needed for moderate pain., Disp: , Rfl:  .  Ascorbic Acid (VITAMIN C PO), Take 1 tablet by mouth every evening., Disp: , Rfl:  .  CALCIUM PO, Take 1 tablet by mouth daily. , Disp: , Rfl:  .  colestipol (COLESTID) 1 g tablet, Take up to 6 tabs as needed no more than once daily for chronic biliary diarrhea, Disp: 180 tablet, Rfl: 3 .  levothyroxine (SYNTHROID) 88 MCG tablet, Take 1 tablet (88 mcg total) by mouth daily before breakfast., Disp: 90 tablet, Rfl: 0 .  losartan (COZAAR) 25 MG tablet, TAKE 1 TABLET BY MOUTH ONCE DAILY (Patient taking differently: Take 25 mg by mouth daily. ), Disp: 90 tablet, Rfl: 3 .  magnesium oxide (MAG-OX) 400 (241.3 Mg)  MG tablet, Take 1 tablet (400 mg total) by mouth daily., Disp: 14 tablet, Rfl: 2 .  magnesium oxide (MAG-OX) 400 MG tablet, Take 400 mg by mouth daily., Disp: , Rfl:  .  pantoprazole (PROTONIX) 20 MG tablet, Take 1 tablet (20 mg total) by mouth daily., Disp: 30 tablet, Rfl: 1 .  VITAMIN D PO, Take 1 capsule by mouth every evening., Disp: , Rfl:  .  warfarin (COUMADIN) 4 MG tablet, TAKE ONE TABLET BY MOUTH ON MONDAYS AND FRIDAYS AND TAKE ONE-HALF TABLET ALL OTHER DAYS, Disp: 65 tablet, Rfl: 10 Past Medical History:  Diagnosis Date  . CHOLELITHIASIS, WITH OBSTRUCTION 04/21/2006   s/p ERCP,sprincterotomy, stent ([facility])  . Complication of anesthesia   . 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 GLAUCOMA 04/24/2008  . HYPERLIPIDEMIA 06/03/2006  . Hypertension   . Hypothyroidism lifelong  . OBESITY, MILD 04/24/2008  . Urothelial cancer ([place]) dx'd 09/05/2019   Social History   Socioeconomic History  . Marital status: Widowed    Spouse name: Not on file  . Number of children: Not on file  . Years of education: Not on file  . Highest education level: Not on file  Occupational History  . 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 Smoking status: Former Smoker  Packs/day: 1.00    Years: 20.00    Pack years: 20.00    Quit date: 07/06/1985    Years since quitting: 34.8  . Smokeless tobacco: Former Systems developer  . Tobacco comment: Quit 1988  Vaping Use  . Vaping Use: Never used  Substance and Sexual Activity  . Alcohol use: Yes    Comment: 2-3 glasses of wine daily.  . Drug use: No  . Sexual activity: Not on file  Other Topics Concern  . 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 Then worked in [employer] with [place].       Current Social History 10/18/2019        Patient lives alone in a 2 level home where laundry is in the basement. There is one step  without handrail up to the entrance patient uses.      Patient's method of transportation is personal car.      The highest level of education was advanced degree; Master's in Education.      The patient currently retired.      Identified important Relationships are My daughter, good friend, Manuela Schwartz who is starting with dementia. Several women from church and [place].      Pets : None       Interests / Fun: Read a lot, take walks, [employer] with church groups and [place] groups.       Current Stressors: "Cancer. And unable to get knee replacement last year 2/2 Covid pandemic and this year 2/2 Cancer. Hair is starting to fall out 2/2 chemo"      Religious / Personal Beliefs: "Episcopalian, Christian. I believe in God and [place]."       L. [place], BSN, RN-BC       Social Determinants of Health   Financial Resource Strain:   . Difficulty of Paying Living Expenses: Not on file  Food Insecurity:   . Worried About Charity fundraiser in the Last Year: Not on file  . Ran Out of Food in the Last Year: Not on file  Transportation Needs:   . Lack of Transportation (Medical): Not on file  . Lack of Transportation (Non-Medical): Not on file  Physical Activity:   . Days of Exercise per Week: Not on file  . Minutes of Exercise per Session: Not on file  Stress:   . Feeling of Stress : Not on file  Social Connections:   . Frequency of Communication with Friends and Family: Not on file  . Frequency of Social Gatherings with Friends and Family: Not on file  . Attends Religious Services: Not on file  . Active Member of Clubs or Organizations: Not on file  . Attends Archivist Meetings: Not on file  . Marital Status: Not on file   Family History  Problem Relation Age of Onset  . Cancer Mother        H&N, smoker  . Liver disease Mother   . Cancer Sister        lung, 2011, smoker  . Other Other        grandmother had mastectomy in her 34's unknown reason  .  Macular degeneration Father   . Diverticulitis Father   . Breast cancer Neg Hx     ASSESSMENT Recent Results: The most recent result is correlated with 14 mg per week: Lab Results  Component Value Date   INR 3.9 (A) 04/29/2020  INR 1.0 01/24/2020   INR 2.9 01/01/2020    Anticoagulation Dosing: Description   Take only 1/2 of your 4mg  strength warfarin tablet [2mg  dose] on all days of week--EXCEPT on Wednesdays--DO NOT TAKE ANY WARFARIN ON WEDNESDAYS.      INR today: Supratherapeutic  PLAN Weekly dose was decreased by 14% to 12 mg per week  Patient Instructions  Patient instructed to take medications as defined in the Anti-coagulation Track section of this encounter.  Patient instructed to take today's dose.  Patient instructed to take only 1/2 of your 4mg  strength warfarin tablet [2mg  dose] on all days of week--EXCEPT on Wednesdays--DO NOT TAKE ANY WARFARIN ON WEDNESDAYS.  Patient verbalized understanding of these instructions.    Patient advised to contact clinic or seek medical attention if signs/symptoms of bleeding or thromboembolism occur.  Patient verbalized understanding by repeating back information and was advised to contact me if further medication-related questions arise. Patient was also provided an information handout.  Follow-up Return in 3 weeks (on 05/20/2020) for follow-up INR at 8:45 am.  Pennie Banter, PharmD, CPP  15 minutes spent face-to-face with the patient during the encounter. 50% of time spent on education, including signs/sx bleeding and clotting, as well as food and drug interactions with warfarin. 50% of time was spent on fingerprick POC INR sample collection,processing, results determination, and documentation in http://www.kim.net/.

## 2020-04-29 NOTE — Patient Instructions (Signed)
Patient instructed to take medications as defined in the Anti-coagulation Track section of this encounter.  Patient instructed to take today's dose.  Patient instructed to take only 1/2 of your 4mg  strength warfarin tablet [2mg  dose] on all days of week--EXCEPT on Wednesdays--DO NOT TAKE ANY WARFARIN ON WEDNESDAYS.  Patient verbalized understanding of these instructions.

## 2020-04-30 ENCOUNTER — Other Ambulatory Visit: Payer: Self-pay | Admitting: *Deleted

## 2020-04-30 ENCOUNTER — Inpatient Hospital Stay: Payer: Medicare Other | Attending: Oncology

## 2020-04-30 ENCOUNTER — Other Ambulatory Visit: Payer: Self-pay

## 2020-04-30 DIAGNOSIS — Z452 Encounter for adjustment and management of vascular access device: Secondary | ICD-10-CM | POA: Insufficient documentation

## 2020-04-30 DIAGNOSIS — C689 Malignant neoplasm of urinary organ, unspecified: Secondary | ICD-10-CM

## 2020-04-30 DIAGNOSIS — E039 Hypothyroidism, unspecified: Secondary | ICD-10-CM

## 2020-04-30 DIAGNOSIS — C679 Malignant neoplasm of bladder, unspecified: Secondary | ICD-10-CM | POA: Insufficient documentation

## 2020-04-30 DIAGNOSIS — K219 Gastro-esophageal reflux disease without esophagitis: Secondary | ICD-10-CM

## 2020-04-30 DIAGNOSIS — Z95828 Presence of other vascular implants and grafts: Secondary | ICD-10-CM

## 2020-04-30 MED ORDER — HEPARIN SOD (PORK) LOCK FLUSH 100 UNIT/ML IV SOLN
500.0000 [IU] | Freq: Once | INTRAVENOUS | Status: AC
  Administered 2020-04-30: 500 [IU]
  Filled 2020-04-30: qty 5

## 2020-04-30 MED ORDER — SODIUM CHLORIDE 0.9% FLUSH
10.0000 mL | Freq: Once | INTRAVENOUS | Status: AC
  Administered 2020-04-30: 10 mL
  Filled 2020-04-30: qty 10

## 2020-05-01 ENCOUNTER — Other Ambulatory Visit: Payer: Self-pay | Admitting: Internal Medicine

## 2020-05-01 MED ORDER — FAMOTIDINE 20 MG PO TABS: 20.0000 mg | ORAL_TABLET | Freq: Every day | ORAL | 1 refills | Status: DC

## 2020-05-01 MED ORDER — LEVOTHYROXINE SODIUM 88 MCG PO TABS: 88.0000 ug | ORAL_TABLET | Freq: Every day | ORAL | 3 refills | Status: DC

## 2020-05-06 NOTE — Progress Notes (Signed)
I have reviewed the anticoagulation note.  Patient is on warfarin for secondary hypercoagulable state.  INR was elevated and dose decreased.

## 2020-05-08 DIAGNOSIS — Z1231 Encounter for screening mammogram for malignant neoplasm of breast: Secondary | ICD-10-CM

## 2020-05-10 ENCOUNTER — Telehealth: Payer: Self-pay

## 2020-05-10 NOTE — Telephone Encounter (Signed)
Pt states famotidine (PEPCID) 20 MG tablet is not working. Pt would like to change the med to pantoprazole 20 mg. Please call pt back.

## 2020-05-13 ENCOUNTER — Other Ambulatory Visit: Payer: Self-pay | Admitting: Internal Medicine

## 2020-05-13 MED ORDER — PANTOPRAZOLE SODIUM 20 MG PO TBEC
20.0000 mg | DELAYED_RELEASE_TABLET | Freq: Every day | ORAL | 3 refills | Status: DC
Start: 2020-05-13 — End: 2020-05-13

## 2020-05-13 NOTE — Telephone Encounter (Signed)
I have changed the famotidine back to her pantoprazole.

## 2020-05-20 ENCOUNTER — Ambulatory Visit: Payer: Medicare Other | Admitting: Pharmacist

## 2020-05-20 DIAGNOSIS — D6869 Other thrombophilia: Secondary | ICD-10-CM

## 2020-05-20 LAB — POCT INR: INR: 1.9 — AB (ref 2.0–3.0)

## 2020-05-20 NOTE — Progress Notes (Signed)
INTERNAL MEDICINE TEACHING ATTENDING ADDENDUM  I agree with pharmacy recommendations as outlined in their note.   Bird Tailor N Denny Mccree, MD  

## 2020-05-20 NOTE — Progress Notes (Signed)
Anticoagulation Management Pamela [name] is a 74 y.o. female who reports to the clinic for monitoring of warfarin treatment.    Indication: DVT  Duration: indefinite Supervising physician: Lenice Pressman  Anticoagulation Clinic Visit History: Patient does not report signs/symptoms of bleeding or thromboembolism  Other recent changes: Patient denies and diet, medications, or lifestyle changes Anticoagulation Episode Summary    Current INR goal:  2.0-3.0  TTR:  68.8 % (9.4 y)  Next INR check:  06/17/2020  INR from last check:  1.9 (05/20/2020)  Weekly max warfarin dose:    Target end date:  Indefinite  INR check location:  Anticoagulation Clinic  Preferred lab:    Send INR reminders to:  [facility]   Indications   Secondary hypercoagulable state ([place]) [D68.69] DVT HX OF (Resolved) [E83.151]       Comments:        Anticoagulation Care Providers    Provider Role Specialty Phone number   Burman Freestone, MD  Internal Medicine [phone]      Allergies  Allergen Reactions  . Ace Inhibitors Cough    Current Outpatient Medications:  .  acetaminophen (TYLENOL) 325 MG tablet, Take 325-650 mg by mouth daily as needed for moderate pain., Disp: , Rfl:  .  Ascorbic Acid (VITAMIN C PO), Take 1 tablet by mouth every evening., Disp: , Rfl:  .  CALCIUM PO, Take 1 tablet by mouth daily. , Disp: , Rfl:  .  colestipol (COLESTID) 1 g tablet, Take up to 6 tabs as needed no more than once daily for chronic biliary diarrhea, Disp: 180 tablet, Rfl: 3 .  levothyroxine (SYNTHROID) 88 MCG tablet, Take 1 tablet (88 mcg total) by mouth daily before breakfast., Disp: 90 tablet, Rfl: 3 .  losartan (COZAAR) 25 MG tablet, TAKE 1 TABLET BY MOUTH ONCE DAILY (Patient taking differently: Take 25 mg by mouth daily. ), Disp: 90 tablet, Rfl: 3 .  magnesium oxide (MAG-OX) 400 (241.3 Mg) MG tablet, Take 1 tablet (400 mg total) by mouth daily., Disp: 14 tablet, Rfl: 2 .  magnesium oxide (MAG-OX)  400 MG tablet, Take 400 mg by mouth daily., Disp: , Rfl:  .  pantoprazole (PROTONIX) 20 MG tablet, Take 1 tablet (20 mg total) by mouth daily., Disp: 90 tablet, Rfl: 3 .  VITAMIN D PO, Take 1 capsule by mouth every evening., Disp: , Rfl:  .  warfarin (COUMADIN) 4 MG tablet, TAKE ONE TABLET BY MOUTH ON MONDAYS AND FRIDAYS AND TAKE ONE-HALF TABLET ALL OTHER DAYS, Disp: 65 tablet, Rfl: 10 Past Medical History:  Diagnosis Date  . CHOLELITHIASIS, WITH OBSTRUCTION 04/21/2006   s/p ERCP,sprincterotomy, stent (Magod)  . Complication of anesthesia   . 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 GLAUCOMA 04/24/2008  . HYPERLIPIDEMIA 06/03/2006  . Hypertension   . Hypothyroidism lifelong  . OBESITY, MILD 04/24/2008  . Urothelial cancer ([place]) dx'd 09/05/2019   Social History   Socioeconomic History  . Marital status: Widowed    Spouse name: Not on file  . Number of children: Not on file  . Years of education: Not on file  . Highest education level: Not on file  Occupational History  . 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 Smoking status: Former Smoker    Packs/day: 1.00    Years: 20.00    Pack years: 20.00  Quit date: 07/06/1985    Years since quitting: 34.8  . Smokeless tobacco: Former Systems developer  . Tobacco comment: Quit 1988  Vaping Use  . Vaping Use: Never used  Substance and Sexual Activity  . Alcohol use: Yes    Comment: 2-3 glasses of wine daily.  . Drug use: No  . Sexual activity: Not on file  Other Topics Concern  . 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       Current Social History 10/18/2019        Patient lives alone in a 2 level home where laundry is in the basement. There is one step without handrail up to the entrance patient uses.      Patient's method of transportation is personal car.       The highest level of education was advanced degree; Master's in Education.      The patient currently retired.      Identified important Relationships are My daughter, good friend, Manuela Schwartz who is starting with dementia. Several women from church and [place].      Pets : None       Interests / Fun: Read a lot, take walks, [employer] with church groups and [place] groups.       Current Stressors: "Cancer. And unable to get knee replacement last year 2/2 Covid pandemic and this year 2/2 Cancer. Hair is starting to fall out 2/2 chemo"      Religious / Personal Beliefs: "Episcopalian, Christian. I believe in God and [place]."       L. Ducatte, BSN, RN-BC       Social Determinants of Health   Financial Resource Strain:   . Difficulty of Paying Living Expenses: Not on file  Food Insecurity:   . Worried About Charity fundraiser in the Last Year: Not on file  . Ran Out of Food in the Last Year: Not on file  Transportation Needs:   . Lack of Transportation (Medical): Not on file  . Lack of Transportation (Non-Medical): Not on file  Physical Activity:   . Days of Exercise per Week: Not on file  . Minutes of Exercise per Session: Not on file  Stress:   . Feeling of Stress : Not on file  Social Connections:   . Frequency of Communication with Friends and Family: Not on file  . Frequency of Social Gatherings with Friends and Family: Not on file  . Attends Religious Services: Not on file  . Active Member of Clubs or Organizations: Not on file  . Attends Archivist Meetings: Not on file  . Marital Status: Not on file   Family History  Problem Relation Age of Onset  . Cancer Mother        H&N, smoker  . Liver disease Mother   . Cancer Sister        lung, 2011, smoker  . Other Other        grandmother had mastectomy in her 23's unknown reason  . Macular degeneration Father   . Diverticulitis Father   . Breast cancer Neg Hx     ASSESSMENT Recent  Results: The most recent result is correlated with 12 mg per week: Lab Results  Component Value Date   INR 1.9 (A) 05/20/2020   INR 3.9 (A) 04/29/2020   INR 1.0 01/24/2020    Anticoagulation Dosing: Description  Take only 1/2 of your 4mg  strength warfarin tablet [2mg  dose] on all days of week     INR today: Subtherapeutic  PLAN Weekly dose was increased by 16% to 14 mg per week  There are no Patient Instructions on file for this visit. Patient advised to contact clinic or seek medical attention if signs/symptoms of bleeding or thromboembolism occur.  Patient verbalized understanding by repeating back information and was advised to contact me if further medication-related questions arise. Patient was also provided an information handout.  Follow-up Return in about 4 weeks (around 06/17/2020) for Follow-up INR at 9:00 am.  [name], PharmD PGY-1 Acute Care Pharmacy Resident   15 minutes spent face-to-face with the patient during the encounter. 50% of time spent on education, including signs/sx bleeding and clotting, as well as food and drug interactions with warfarin. 50% of time was spent on fingerprick POC INR sample collection,processing, results determination, and documentation in [email or web address]

## 2020-06-03 ENCOUNTER — Other Ambulatory Visit: Payer: Self-pay | Admitting: Internal Medicine

## 2020-06-03 DIAGNOSIS — Z1231 Encounter for screening mammogram for malignant neoplasm of breast: Secondary | ICD-10-CM

## 2020-06-10 ENCOUNTER — Ambulatory Visit
Admission: RE | Admit: 2020-06-10 | Discharge: 2020-06-10 | Disposition: A | Discharge status: Home / Self Care | Payer: Medicare Other | Source: Ambulatory Visit

## 2020-06-10 ENCOUNTER — Other Ambulatory Visit: Payer: Self-pay

## 2020-06-10 DIAGNOSIS — Z1231 Encounter for screening mammogram for malignant neoplasm of breast: Secondary | ICD-10-CM

## 2020-06-17 ENCOUNTER — Ambulatory Visit: Payer: Medicare Other

## 2020-06-17 DIAGNOSIS — D6869 Other thrombophilia: Secondary | ICD-10-CM

## 2020-06-17 LAB — POCT INR: INR: 2.7 (ref 2.0–3.0)

## 2020-06-17 NOTE — Progress Notes (Signed)
INTERNAL MEDICINE TEACHING ATTENDING ADDENDUM - Seif Teichert M.D  Duration- indefinite, Indication- recurrent VTE, Factor II mutation, INR- therapeutic. Agree with pharmacy recommendations as outlined in their note.

## 2020-06-17 NOTE — Progress Notes (Signed)
Anticoagulation Management Pamela [name] is a 74 y.o. female who reports to the clinic for monitoring of warfarin treatment.    Indication: DVT  Duration: indefinite Supervising physician: Aldine Contes  Anticoagulation Clinic Visit History: Patient does report signs/symptoms of bleeding or thromboembolism - reports some minor nose bleeding when she blows her nose but says it does not happen often.  If continues or gets worse, given verbal instructions to contact clinic.  Patient verbalized understanding. Other recent changes: denies any diet, medications, or lifestyle changes Anticoagulation Episode Summary    Current INR goal:  2.0-3.0  TTR:  68.9 % (9.5 y)  Next INR check:  07/08/2020  INR from last check:  2.7 (06/17/2020)  Weekly max warfarin dose:    Target end date:  Indefinite  INR check location:  Anticoagulation Clinic  Preferred lab:    Send INR reminders to:  [facility]   Indications   Secondary hypercoagulable state ([place]) [D68.69] DVT HX OF (Resolved) [X73.532]       Comments:        Anticoagulation Care Providers    Provider Role Specialty Phone number   Burman Freestone, MD  Internal Medicine [phone]      Allergies  Allergen Reactions  . Ace Inhibitors Cough    Current Outpatient Medications:  .  acetaminophen (TYLENOL) 325 MG tablet, Take 325-650 mg by mouth daily as needed for moderate pain., Disp: , Rfl:  .  Ascorbic Acid (VITAMIN C PO), Take 1 tablet by mouth every evening., Disp: , Rfl:  .  CALCIUM PO, Take 1 tablet by mouth daily. , Disp: , Rfl:  .  colestipol (COLESTID) 1 g tablet, Take up to 6 tabs as needed no more than once daily for chronic biliary diarrhea, Disp: 180 tablet, Rfl: 3 .  levothyroxine (SYNTHROID) 88 MCG tablet, Take 1 tablet (88 mcg total) by mouth daily before breakfast., Disp: 90 tablet, Rfl: 3 .  losartan (COZAAR) 25 MG tablet, TAKE 1 TABLET BY MOUTH ONCE DAILY (Patient taking differently: Take 25 mg by mouth  daily. ), Disp: 90 tablet, Rfl: 3 .  magnesium oxide (MAG-OX) 400 (241.3 Mg) MG tablet, Take 1 tablet (400 mg total) by mouth daily., Disp: 14 tablet, Rfl: 2 .  magnesium oxide (MAG-OX) 400 MG tablet, Take 400 mg by mouth daily., Disp: , Rfl:  .  pantoprazole (PROTONIX) 20 MG tablet, Take 1 tablet (20 mg total) by mouth daily., Disp: 90 tablet, Rfl: 3 .  VITAMIN D PO, Take 1 capsule by mouth every evening., Disp: , Rfl:  .  warfarin (COUMADIN) 4 MG tablet, TAKE ONE TABLET BY MOUTH ON MONDAYS AND FRIDAYS AND TAKE ONE-HALF TABLET ALL OTHER DAYS, Disp: 65 tablet, Rfl: 10 Past Medical History:  Diagnosis Date  . CHOLELITHIASIS, WITH OBSTRUCTION 04/21/2006   s/p ERCP,sprincterotomy, stent ([facility])  . Complication of anesthesia   . 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 GLAUCOMA 04/24/2008  . HYPERLIPIDEMIA 06/03/2006  . Hypertension   . Hypothyroidism lifelong  . OBESITY, MILD 04/24/2008  . Urothelial cancer ([place]) dx'd 09/05/2019   Social History   Socioeconomic History  . Marital status: Widowed    Spouse name: Not on file  . Number of children: Not on file  . Years of education: Not on file  . Highest education level: Not on file  Occupational History  . 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 Smoking status: Former Smoker    Packs/day: 1.00    Years: 20.00    Pack years: 20.00    Quit date: 07/06/1985    Years since quitting: 34.9  . Smokeless tobacco: Former Systems developer  . Tobacco comment: Quit 1988  Vaping Use  . Vaping Use: Never used  Substance and Sexual Activity  . Alcohol use: Yes    Comment: 2-3 glasses of wine daily.  . Drug use: No  . Sexual activity: Not on file  Other Topics Concern  . 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       Current Social History 10/18/2019        Patient  lives alone in a 2 level home where laundry is in the basement. There is one step without handrail up to the entrance patient uses.      Patient's method of transportation is personal car.      The highest level of education was advanced degree; Master's in Education.      The patient currently retired.      Identified important Relationships are My daughter, good friend, Manuela Schwartz who is starting with dementia. Several women from church and [place].      Pets : None       Interests / Fun: Read a lot, take walks, [employer] with church groups and [place] groups.       Current Stressors: "Cancer. And unable to get knee replacement last year 2/2 Covid pandemic and this year 2/2 Cancer. Hair is starting to fall out 2/2 chemo"      Religious / Personal Beliefs: "Episcopalian, Christian. I believe in God and [place]."       L. Ducatte, BSN, RN-BC       Social Determinants of Health   Financial Resource Strain: Not on file  Food Insecurity: Not on file  Transportation Needs: Not on file  Physical Activity: Not on file  Stress: Not on file  Social Connections: Not on file   Family History  Problem Relation Age of Onset  . Cancer Mother        H&N, smoker  . Liver disease Mother   . Cancer Sister        lung, 2011, smoker  . Other Other        grandmother had mastectomy in her 39's unknown reason  . Macular degeneration Father   . Diverticulitis Father   . Breast cancer Neg Hx     ASSESSMENT Recent Results: The most recent result is correlated with 14 mg per week: Lab Results  Component Value Date   INR 2.7 06/17/2020   INR 1.9 (A) 05/20/2020   INR 3.9 (A) 04/29/2020    Anticoagulation Dosing: Description   Take only 1/2 of your 4mg  strength warfarin tablet [2mg  dose] on all days of week     INR today: Therapeutic  PLAN Weekly dose was unchanged and will continue 14 mg per week  There are no Patient Instructions on file for this visit. Patient advised  to contact clinic or seek medical attention if signs/symptoms of bleeding or thromboembolism occur.  Patient verbalized understanding by repeating back information and was advised to contact me if further medication-related questions arise. Patient was also provided an information handout.  Follow-up Return in about 3 weeks (around 07/08/2020) for Follow up INR  at 9:00 am.  [name], PharmD PGY-1 Acute Care Pharmacy Resident 06/17/2020 9:20 AM  15 minutes spent face-to-face with the patient during the encounter. 50% of time spent on education, including signs/sx bleeding and clotting, as well as food and drug interactions with warfarin. 50% of time was spent on fingerprick POC INR sample collection,processing, results determination, and documentation in [email or web address]

## 2020-06-18 IMAGING — CT CT CHEST W/ CM
2 of 4 series · 15 of 36 positions shown, 18 images · IV contrast (OMNIPAQUE)
Comparison: None.

CLINICAL DATA: Bladder cancer. Staging.

EXAM:
CT CHEST WITH CONTRAST
TECHNIQUE: Multidetector CT imaging of the chest was performed during
intravenous contrast administration.
CONTRAST:  75mL OMNIPAQUE IOHEXOL 300 MG/ML  SOLN

[Series 2: axial st · axial · 0.73mm/px · z∈[+1382,+1646]mm · 12 of 154 slices shown, 15 images]
[im 11/154  mediastinal]
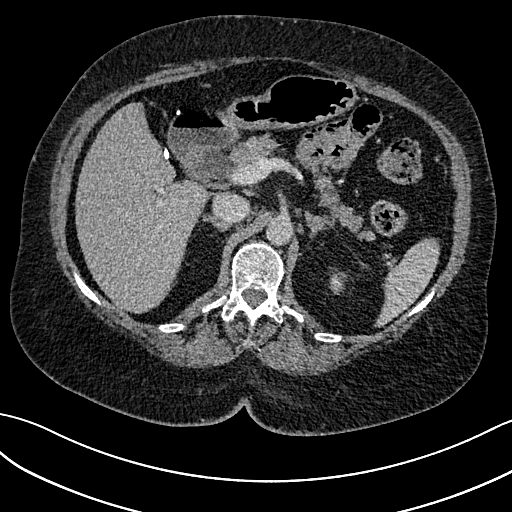
[im 11/154  lung]
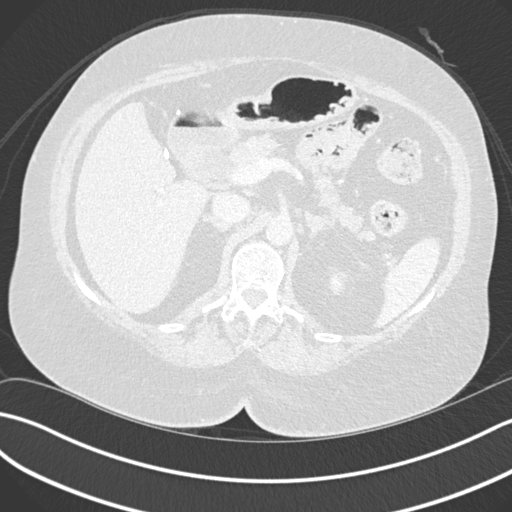
[im 22/154  lung]
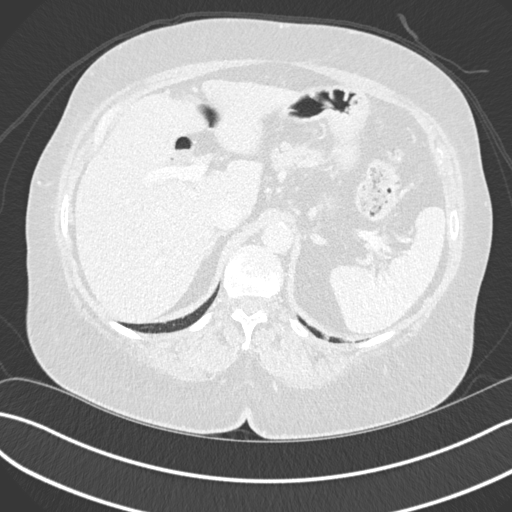
[im 33/154  lung]
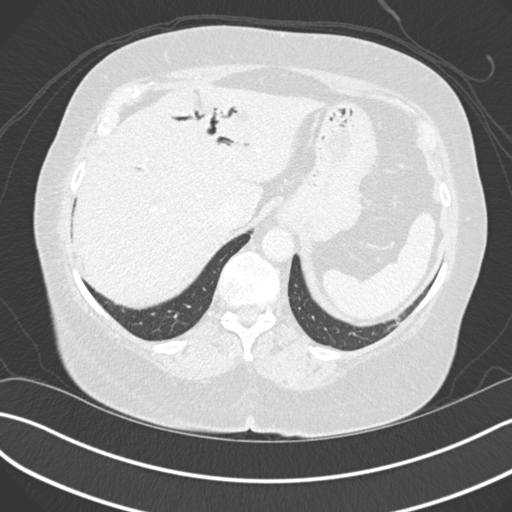
[im 44/154  lung]
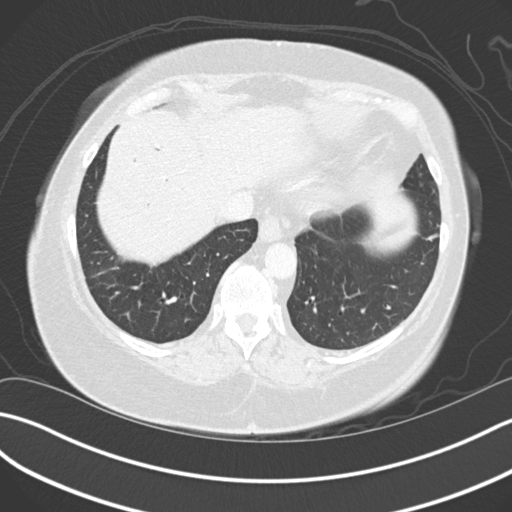
[im 55/154  mediastinal]
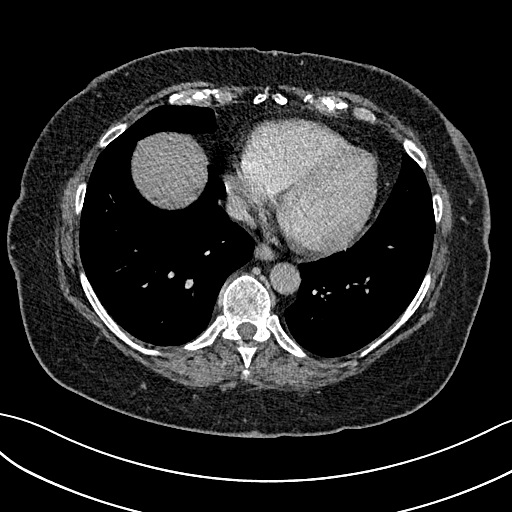
[im 55/154  lung]
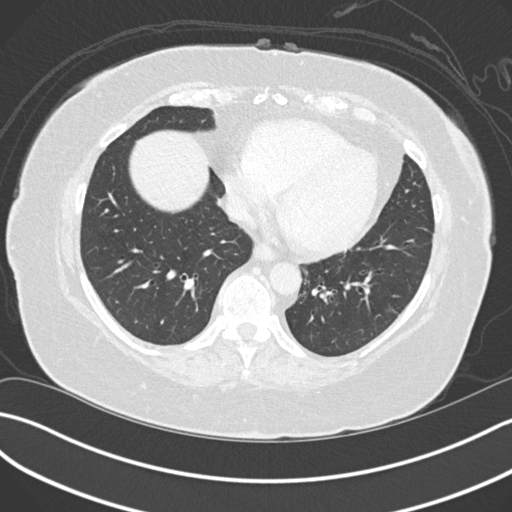
[im 66/154  lung]
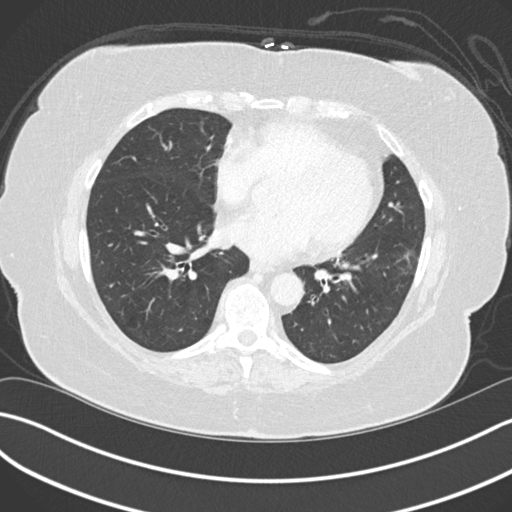
[im 88/154  lung]
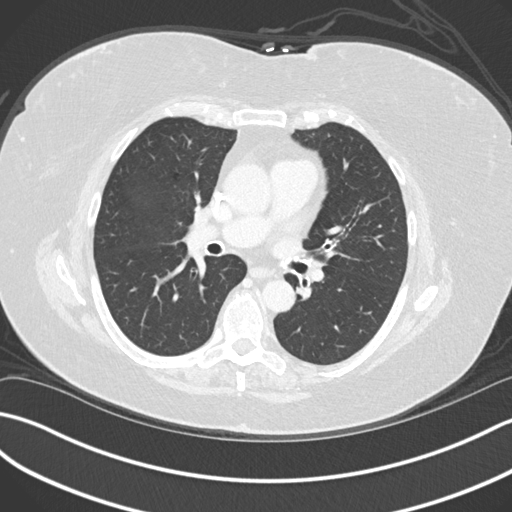
[im 99/154  lung]
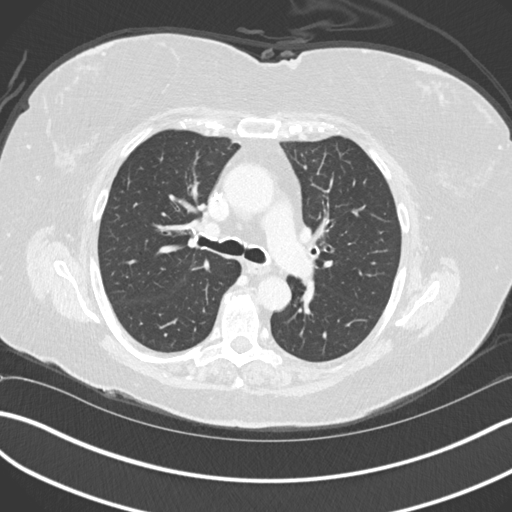
[im 110/154  mediastinal]
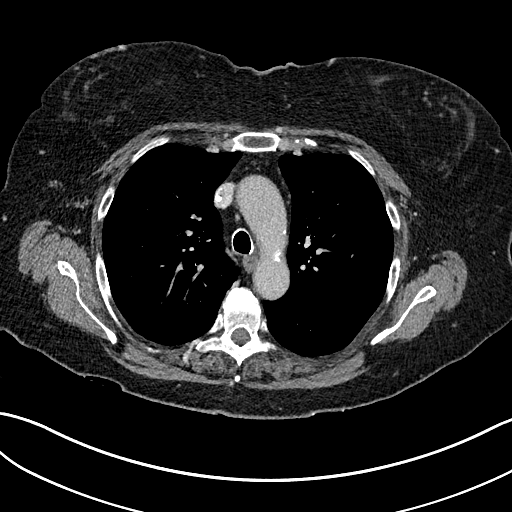
[im 110/154  lung]
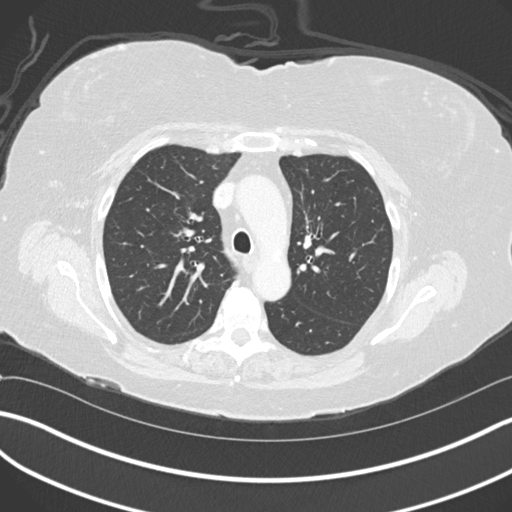
[im 121/154  lung]
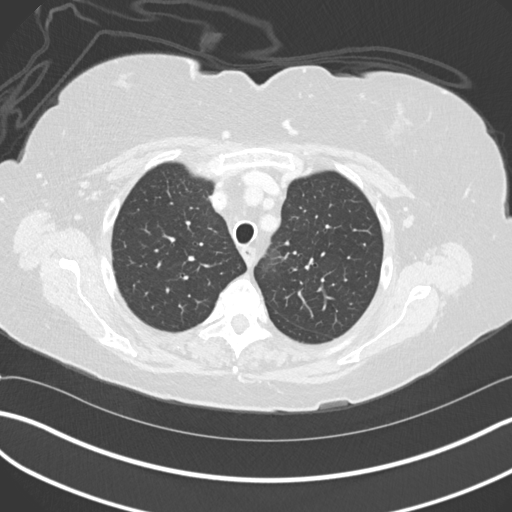
[im 132/154  lung]
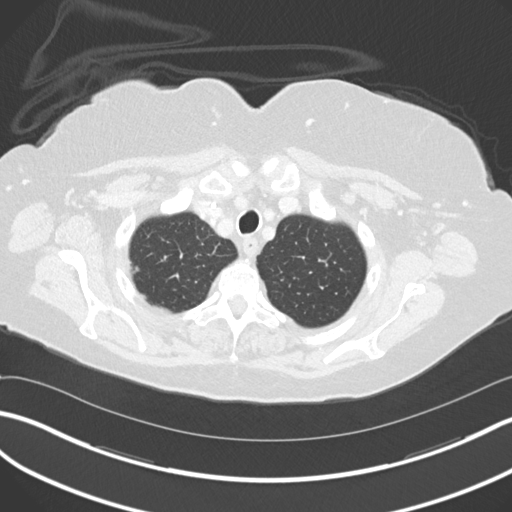
[im 143/154  lung]
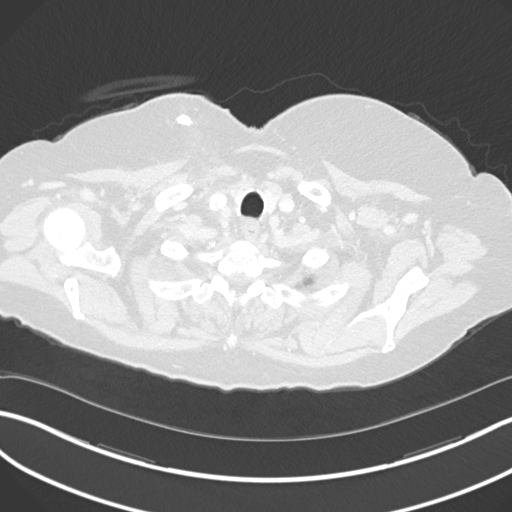

[Series 5: coronal · coronal · 0.63mm/px · 3 of 135 slices shown]
[im 27/135  lung]
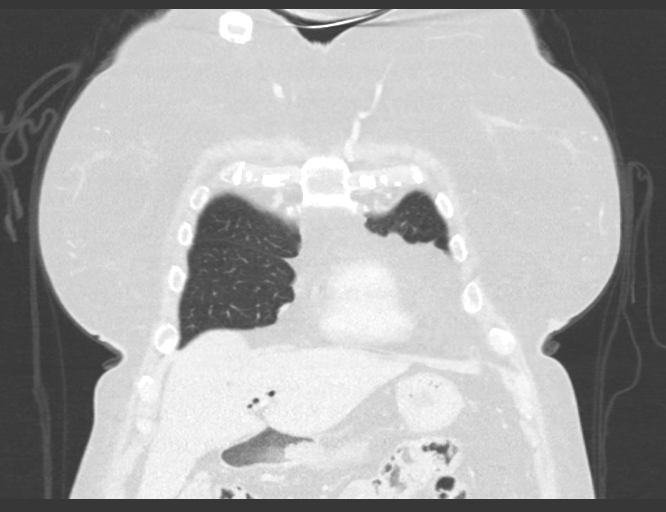
[im 54/135  lung]
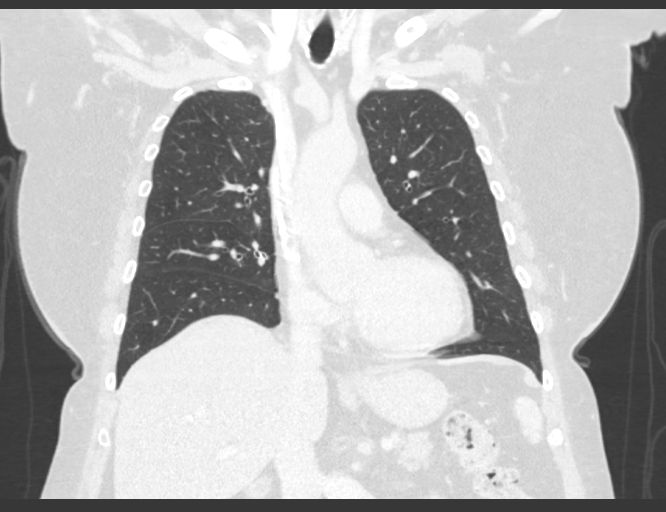
[im 81/135  lung]
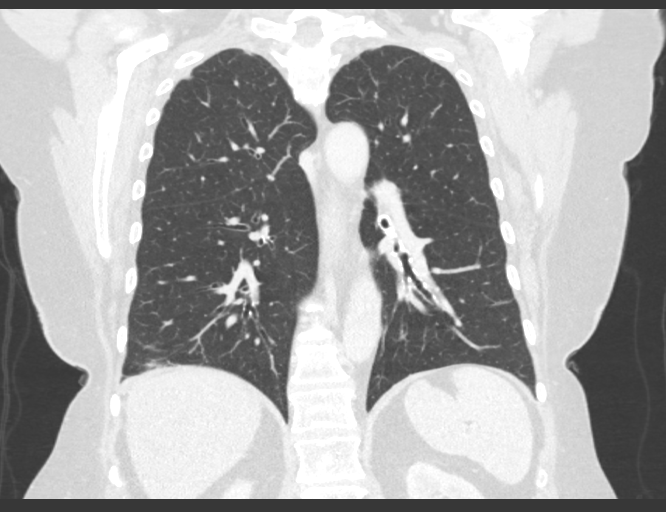

[15 of 36 positions shown; findings below may reference images not displayed]

FINDINGS: Cardiovascular: The heart size is normal. No substantial pericardial
effusion. Atherosclerotic calcification is noted in the wall of the
thoracic aorta. Right Port-A-Cath tip is positioned at the SVC/RA
junction.

Mediastinum/Nodes: No mediastinal lymphadenopathy. There is no hilar
lymphadenopathy. The esophagus has normal imaging features. Tiny
hiatal hernia evident. There is no axillary lymphadenopathy.

Lungs/Pleura: No suspicious pulmonary nodule or mass. No focal
airspace consolidation. No pleural effusion.

Upper Abdomen: Pneumobilia suggests prior sphincterotomy. Common
bile duct has been incompletely visualized but is 12 mm diameter
proximally.

Musculoskeletal: No worrisome lytic or sclerotic osseous
abnormality.
IMPRESSION: No evidence for metastatic disease in the chest.

Aortic Atherosclerois ([id number])

## 2020-06-25 ENCOUNTER — Other Ambulatory Visit: Payer: Self-pay

## 2020-06-25 ENCOUNTER — Inpatient Hospital Stay: Payer: Medicare Other | Attending: Oncology

## 2020-06-25 DIAGNOSIS — Z95828 Presence of other vascular implants and grafts: Secondary | ICD-10-CM

## 2020-06-25 DIAGNOSIS — Z452 Encounter for adjustment and management of vascular access device: Secondary | ICD-10-CM | POA: Diagnosis not present

## 2020-06-25 DIAGNOSIS — C679 Malignant neoplasm of bladder, unspecified: Secondary | ICD-10-CM | POA: Diagnosis present

## 2020-06-25 DIAGNOSIS — C689 Malignant neoplasm of urinary organ, unspecified: Secondary | ICD-10-CM

## 2020-06-25 MED ORDER — SODIUM CHLORIDE 0.9% FLUSH
10.0000 mL | Freq: Once | INTRAVENOUS | Status: AC
  Administered 2020-06-25: 10 mL
  Filled 2020-06-25: qty 10

## 2020-06-25 MED ORDER — HEPARIN SOD (PORK) LOCK FLUSH 100 UNIT/ML IV SOLN
500.0000 [IU] | Freq: Once | INTRAVENOUS | Status: AC
  Administered 2020-06-25: 500 [IU]
  Filled 2020-06-25: qty 5

## 2020-07-08 ENCOUNTER — Other Ambulatory Visit: Payer: Self-pay

## 2020-07-08 ENCOUNTER — Ambulatory Visit: Payer: Medicare Other

## 2020-07-08 DIAGNOSIS — D6869 Other thrombophilia: Secondary | ICD-10-CM

## 2020-07-08 LAB — POCT INR: INR: 2 (ref 2.0–3.0)

## 2020-07-08 NOTE — Progress Notes (Signed)
INTERNAL MEDICINE TEACHING ATTENDING ADDENDUM   I agree with pharmacy recommendations as outlined in their note.   Braidyn Scorsone, MD  

## 2020-07-08 NOTE — Progress Notes (Signed)
Anticoagulation Management Pamela [name] is a 75 y.o. female who reports to the clinic for monitoring of warfarin treatment.    Indication: secondary hypercoagulable state  Duration: indefinite Supervising physician: Velna Ochs  Anticoagulation Clinic Visit History: Patient does not report signs/symptoms of bleeding or thromboembolism  Other recent changes: denies diet, medications, or lifestyle changes Anticoagulation Episode Summary    Current INR goal:  2.0-3.0  TTR:  69.1 % (9.5 y)  Next INR check:  07/29/2020  INR from last check:  2.0 (07/08/2020)  Weekly max warfarin dose:    Target end date:  Indefinite  INR check location:  Anticoagulation Clinic  Preferred lab:    Send INR reminders to:  [facility]   Indications   Secondary hypercoagulable state ([place]) [D68.69] DVT HX OF (Resolved) [id number]       Comments:        Anticoagulation Care Providers    Provider Role Specialty Phone number   Burman Freestone, MD  Internal Medicine [phone]      Allergies  Allergen Reactions  . Ace Inhibitors Cough    Current Outpatient Medications:  .  acetaminophen (TYLENOL) 325 MG tablet, Take 325-650 mg by mouth daily as needed for moderate pain., Disp: , Rfl:  .  Ascorbic Acid (VITAMIN C PO), Take 1 tablet by mouth every evening., Disp: , Rfl:  .  CALCIUM PO, Take 1 tablet by mouth daily. , Disp: , Rfl:  .  colestipol (COLESTID) 1 g tablet, Take up to 6 tabs as needed no more than once daily for chronic biliary diarrhea, Disp: 180 tablet, Rfl: 3 .  levothyroxine (SYNTHROID) 88 MCG tablet, Take 1 tablet (88 mcg total) by mouth daily before breakfast., Disp: 90 tablet, Rfl: 3 .  losartan (COZAAR) 25 MG tablet, TAKE 1 TABLET BY MOUTH ONCE DAILY (Patient taking differently: Take 25 mg by mouth daily. ), Disp: 90 tablet, Rfl: 3 .  magnesium oxide (MAG-OX) 400 (241.3 Mg) MG tablet, Take 1 tablet (400 mg total) by mouth daily., Disp: 14 tablet, Rfl: 2 .  magnesium  oxide (MAG-OX) 400 MG tablet, Take 400 mg by mouth daily., Disp: , Rfl:  .  pantoprazole (PROTONIX) 20 MG tablet, Take 1 tablet (20 mg total) by mouth daily., Disp: 90 tablet, Rfl: 3 .  VITAMIN D PO, Take 1 capsule by mouth every evening., Disp: , Rfl:  .  warfarin (COUMADIN) 4 MG tablet, TAKE ONE TABLET BY MOUTH ON MONDAYS AND FRIDAYS AND TAKE ONE-HALF TABLET ALL OTHER DAYS, Disp: 65 tablet, Rfl: 10 Past Medical History:  Diagnosis Date  . CHOLELITHIASIS, WITH OBSTRUCTION 04/21/2006   s/p ERCP,sprincterotomy, stent ([facility])  . Complication of anesthesia   . 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 GLAUCOMA 04/24/2008  . HYPERLIPIDEMIA 06/03/2006  . Hypertension   . Hypothyroidism lifelong  . OBESITY, MILD 04/24/2008  . Urothelial cancer ([place]) dx'd 09/05/2019   Social History   Socioeconomic History  . Marital status: Widowed    Spouse name: Not on file  . Number of children: Not on file  . Years of education: Not on file  . Highest education level: Not on file  Occupational History  . 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 Smoking status: Former Smoker    Packs/day: 1.00    Years: 20.00    Pack years: 20.00  Quit date: 07/06/1985    Years since quitting: 35.0  . Smokeless tobacco: Former Neurosurgeon  . Tobacco comment: Quit 1988  Vaping Use  . Vaping Use: Never used  Substance and Sexual Activity  . Alcohol use: Yes    Comment: 2-3 glasses of wine daily.  . Drug use: No  . Sexual activity: Not on file  Other Topics Concern  . 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       Current Social History 10/18/2019        Patient lives alone in a 2 level home where laundry is in the basement. There is one step without handrail up to the entrance patient uses.      Patient's method of transportation is  personal car.      The highest level of education was advanced degree; Master's in Education.      The patient currently retired.      Identified important Relationships are My daughter, good friend, Darl Pikes who is starting with dementia. Several women from church and [employer].      Pets : None       Interests / Fun: Read a lot, take walks, [employer] with church groups and [employer] groups.       Current Stressors: "Cancer. And unable to get knee replacement last year 2/2 Covid pandemic and this year 2/2 Cancer. Hair is starting to fall out 2/2 chemo"      Religious / Personal Beliefs: "Episcopalian, Christian. I believe in God and [place] and the [place]."       L. Ducatte, BSN, RN-BC       Social Determinants of Health   Financial Resource Strain: Not on file  Food Insecurity: Not on file  Transportation Needs: Not on file  Physical Activity: Not on file  Stress: Not on file  Social Connections: Not on file   Family History  Problem Relation Age of Onset  . Cancer Mother        H&N, smoker  . Liver disease Mother   . Cancer Sister        lung, 2011, smoker  . Other Other        grandmother had mastectomy in her 40's unknown reason  . Macular degeneration Father   . Diverticulitis Father   . Breast cancer Neg Hx     ASSESSMENT Recent Results: The most recent result is correlated with 14 mg per week: Lab Results  Component Value Date   INR 2.0 07/08/2020   INR 2.7 06/17/2020   INR 1.9 (A) 05/20/2020    Anticoagulation Dosing: Description   Take only 1/2 of your 4mg  strength warfarin tablet [2mg  dose] on all days of week     INR today: Therapeutic  PLAN Weekly dose was unchanged and will continue at 14 mg per week  There are no Patient Instructions on file for this visit. Patient advised to contact clinic or seek medical attention if signs/symptoms of bleeding or thromboembolism occur.  Patient verbalized understanding by repeating back information and was  advised to contact me if further medication-related questions arise. Patient was also provided an information handout.  Follow-up Return in about 3 weeks (around 07/29/2020) for Follow up INR at 9:30 am.  07/31/2020, PharmD PGY-1 Acute Care Pharmacy Resident 07/08/2020 9:24 AM   15 minutes spent face-to-face with the patient during the encounter. 50% of time spent  on education, including signs/sx bleeding and clotting, as well as food and drug interactions with warfarin. 50% of time was spent on fingerprick POC INR sample collection,processing, results determination, and documentation in [email or web address]

## 2020-07-10 ENCOUNTER — Other Ambulatory Visit: Payer: Self-pay

## 2020-07-10 ENCOUNTER — Other Ambulatory Visit (HOSPITAL_COMMUNITY): Payer: Self-pay | Admitting: Urology

## 2020-07-10 ENCOUNTER — Ambulatory Visit (HOSPITAL_COMMUNITY)
Admission: RE | Admit: 2020-07-10 | Discharge: 2020-07-10 | Disposition: A | Discharge status: Home / Self Care | Payer: Medicare Other | Source: Ambulatory Visit | Attending: Urology | Admitting: Urology

## 2020-07-10 DIAGNOSIS — C674 Malignant neoplasm of posterior wall of bladder: Secondary | ICD-10-CM | POA: Diagnosis not present

## 2020-07-29 ENCOUNTER — Ambulatory Visit: Payer: Medicare Other

## 2020-07-29 DIAGNOSIS — D6869 Other thrombophilia: Secondary | ICD-10-CM

## 2020-07-29 LAB — POCT INR: INR: 2.1 (ref 2.0–3.0)

## 2020-07-29 NOTE — Progress Notes (Signed)
INTERNAL MEDICINE TEACHING ATTENDING ADDENDUM   I agree with pharmacy recommendations as outlined in their note.   Jamayah Myszka, MD  

## 2020-07-29 NOTE — Progress Notes (Signed)
Anticoagulation Management Pamela [name] is a 75 y.o. female who reports to the clinic for monitoring of warfarin treatment.    Indication: DVT, secondary hypercoagulable state  Duration: indefinite Supervising physician: Velna Ochs  Anticoagulation Clinic Visit History: Patient does not report signs/symptoms of bleeding or thromboembolism  Other recent changes: denies any diet, medications, or  Lifestyle changes Anticoagulation Episode Summary    Current INR goal:  2.0-3.0  TTR:  69.3 % (9.6 y)  Next INR check:  08/19/2020  INR from last check:  2.1 (07/29/2020)  Weekly max warfarin dose:    Target end date:  Indefinite  INR check location:  Anticoagulation Clinic  Preferred lab:    Send INR reminders to:  [facility]   Indications   Secondary hypercoagulable state ([place]) [D68.69] DVT HX OF (Resolved) [Q00.867]       Comments:        Anticoagulation Care Providers    Provider Role Specialty Phone number   Burman Freestone, MD  Internal Medicine [phone]      Allergies  Allergen Reactions  . Ace Inhibitors Cough    Current Outpatient Medications:  .  acetaminophen (TYLENOL) 325 MG tablet, Take 325-650 mg by mouth daily as needed for moderate pain., Disp: , Rfl:  .  Ascorbic Acid (VITAMIN C PO), Take 1 tablet by mouth every evening., Disp: , Rfl:  .  CALCIUM PO, Take 1 tablet by mouth daily. , Disp: , Rfl:  .  colestipol (COLESTID) 1 g tablet, Take up to 6 tabs as needed no more than once daily for chronic biliary diarrhea, Disp: 180 tablet, Rfl: 3 .  levothyroxine (SYNTHROID) 88 MCG tablet, Take 1 tablet (88 mcg total) by mouth daily before breakfast., Disp: 90 tablet, Rfl: 3 .  losartan (COZAAR) 25 MG tablet, TAKE 1 TABLET BY MOUTH ONCE DAILY (Patient taking differently: Take 25 mg by mouth daily. ), Disp: 90 tablet, Rfl: 3 .  magnesium oxide (MAG-OX) 400 (241.3 Mg) MG tablet, Take 1 tablet (400 mg total) by mouth daily., Disp: 14 tablet, Rfl: 2 .   magnesium oxide (MAG-OX) 400 MG tablet, Take 400 mg by mouth daily., Disp: , Rfl:  .  pantoprazole (PROTONIX) 20 MG tablet, Take 1 tablet (20 mg total) by mouth daily., Disp: 90 tablet, Rfl: 3 .  VITAMIN D PO, Take 1 capsule by mouth every evening., Disp: , Rfl:  .  warfarin (COUMADIN) 4 MG tablet, TAKE ONE TABLET BY MOUTH ON MONDAYS AND FRIDAYS AND TAKE ONE-HALF TABLET ALL OTHER DAYS, Disp: 65 tablet, Rfl: 10 Past Medical History:  Diagnosis Date  . CHOLELITHIASIS, WITH OBSTRUCTION 04/21/2006   s/p ERCP,sprincterotomy, stent ([facility])  . Complication of anesthesia   . 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 GLAUCOMA 04/24/2008  . HYPERLIPIDEMIA 06/03/2006  . Hypertension   . Hypothyroidism lifelong  . OBESITY, MILD 04/24/2008  . Urothelial cancer ([place]) dx'd 09/05/2019   Social History   Socioeconomic History  . Marital status: Widowed    Spouse name: Not on file  . Number of children: Not on file  . Years of education: Not on file  . Highest education level: Not on file  Occupational History  . 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 Smoking status: Former Smoker    Packs/day: 1.00    Years: 20.00    Pack years: 20.00  Quit date: 07/06/1985    Years since quitting: 35.0  . Smokeless tobacco: Former Systems developer  . Tobacco comment: Quit 1988  Vaping Use  . Vaping Use: Never used  Substance and Sexual Activity  . Alcohol use: Yes    Comment: 2-3 glasses of wine daily.  . Drug use: No  . Sexual activity: Not on file  Other Topics Concern  . 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       Current Social History 10/18/2019        Patient lives alone in a 2 level home where laundry is in the basement. There is one step without handrail up to the entrance patient uses.      Patient's method of  transportation is personal car.      The highest level of education was advanced degree; Master's in Education.      The patient currently retired.      Identified important Relationships are My daughter, good friend, Manuela Schwartz who is starting with dementia. Several women from church and [place].      Pets : None       Interests / Fun: Read a lot, take walks, [employer] with church groups and [place] groups.       Current Stressors: "Cancer. And unable to get knee replacement last year 2/2 Covid pandemic and this year 2/2 Cancer. Hair is starting to fall out 2/2 chemo"      Religious / Personal Beliefs: "Episcopalian, Christian. I believe in God and [place]."       L. Ducatte, BSN, RN-BC       Social Determinants of Health   Financial Resource Strain: Not on file  Food Insecurity: Not on file  Transportation Needs: Not on file  Physical Activity: Not on file  Stress: Not on file  Social Connections: Not on file   Family History  Problem Relation Age of Onset  . Cancer Mother        H&N, smoker  . Liver disease Mother   . Cancer Sister        lung, 2011, smoker  . Other Other        grandmother had mastectomy in her 100's unknown reason  . Macular degeneration Father   . Diverticulitis Father   . Breast cancer Neg Hx     ASSESSMENT Recent Results: The most recent result is correlated with 14 mg per week: Lab Results  Component Value Date   INR 2.1 07/29/2020   INR 2.0 07/08/2020   INR 2.7 06/17/2020    Anticoagulation Dosing: Description   Take only 1/2 of your 4mg  strength warfarin tablet [2mg  dose] on all days of week     INR today: Therapeutic  PLAN Weekly dose was unchanged There are no Patient Instructions on file for this visit. Patient advised to contact clinic or seek medical attention if signs/symptoms of bleeding or thromboembolism occur.  Patient verbalized understanding by repeating back information and was advised to contact me if  further medication-related questions arise. Patient was also provided an information handout.  Follow-up Return in about 3 weeks (around 08/19/2020) for Follow-up INR at 9:30 am.  [name], PharmD PGY-1 Acute Care Pharmacy Resident 07/29/2020 9:39 AM  15 minutes spent face-to-face with the patient during the encounter. 50% of time spent on education, including signs/sx bleeding and clotting, as well as food and  drug interactions with warfarin. 50% of time was spent on fingerprick POC INR sample collection,processing, results determination, and documentation in [email or web address]

## 2020-08-19 ENCOUNTER — Ambulatory Visit: Payer: Medicare Other

## 2020-08-19 DIAGNOSIS — D6869 Other thrombophilia: Secondary | ICD-10-CM

## 2020-08-19 LAB — POCT INR: INR: 2.2 (ref 2.0–3.0)

## 2020-08-19 NOTE — Progress Notes (Signed)
Anticoagulation Management Pamela [name] is a 75 y.o. female who reports to the clinic for monitoring of warfarin treatment.    Indication: secondary hypercoagulable state  Duration: indefinite Supervising physician: [facility] Clinic Visit History: Patient does report signs/symptoms of bleeding or thromboembolism- she reports rectal bleeding after BM that has gotten better.  Recommended Preparation H to help with hemorrhoidal bleeding.  Patient states she will contact the clinic if bleeding gets worse or does not resolve. Other recent changes: denies any diet, medications, or lifestyle changes Anticoagulation Episode Summary    Current INR goal:  2.0-3.0  TTR:  69.5 % (9.6 y)  Next INR check:  08/19/2020  INR from last check:  2.2 (08/19/2020)  Weekly max warfarin dose:    Target end date:  Indefinite  INR check location:  Anticoagulation Clinic  Preferred lab:    Send INR reminders to:  [facility]   Indications   Secondary hypercoagulable state ([place]) [D68.69] DVT HX OF (Resolved) [D14.970]       Comments:        Anticoagulation Care Providers    Provider Role Specialty Phone number   Burman Freestone, MD  Internal Medicine [phone]      Allergies  Allergen Reactions  . Ace Inhibitors Cough    Current Outpatient Medications:  .  acetaminophen (TYLENOL) 325 MG tablet, Take 325-650 mg by mouth daily as needed for moderate pain., Disp: , Rfl:  .  Ascorbic Acid (VITAMIN C PO), Take 1 tablet by mouth every evening., Disp: , Rfl:  .  CALCIUM PO, Take 1 tablet by mouth daily. , Disp: , Rfl:  .  colestipol (COLESTID) 1 g tablet, Take up to 6 tabs as needed no more than once daily for chronic biliary diarrhea, Disp: 180 tablet, Rfl: 3 .  levothyroxine (SYNTHROID) 88 MCG tablet, Take 1 tablet (88 mcg total) by mouth daily before breakfast., Disp: 90 tablet, Rfl: 3 .  losartan (COZAAR) 25 MG tablet, TAKE 1 TABLET BY MOUTH ONCE DAILY (Patient taking  differently: Take 25 mg by mouth daily. ), Disp: 90 tablet, Rfl: 3 .  magnesium oxide (MAG-OX) 400 (241.3 Mg) MG tablet, Take 1 tablet (400 mg total) by mouth daily., Disp: 14 tablet, Rfl: 2 .  magnesium oxide (MAG-OX) 400 MG tablet, Take 400 mg by mouth daily., Disp: , Rfl:  .  pantoprazole (PROTONIX) 20 MG tablet, Take 1 tablet (20 mg total) by mouth daily., Disp: 90 tablet, Rfl: 3 .  VITAMIN D PO, Take 1 capsule by mouth every evening., Disp: , Rfl:  .  warfarin (COUMADIN) 4 MG tablet, TAKE ONE TABLET BY MOUTH ON MONDAYS AND FRIDAYS AND TAKE ONE-HALF TABLET ALL OTHER DAYS, Disp: 65 tablet, Rfl: 10 Past Medical History:  Diagnosis Date  . CHOLELITHIASIS, WITH OBSTRUCTION 04/21/2006   s/p ERCP,sprincterotomy, stent ([facility])  . Complication of anesthesia   . 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 GLAUCOMA 04/24/2008  . HYPERLIPIDEMIA 06/03/2006  . Hypertension   . Hypothyroidism lifelong  . OBESITY, MILD 04/24/2008  . Urothelial cancer ([place]) dx'd 09/05/2019   Social History   Socioeconomic History  . Marital status: Widowed    Spouse name: Not on file  . Number of children: Not on file  . Years of education: Not on file  . Highest education level: Not on file  Occupational History  . 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 Smoking status: Former Smoker    Packs/day: 1.00    Years: 20.00    Pack years: 20.00    Quit date: 07/06/1985    Years since quitting: 35.1  . Smokeless tobacco: Former Systems developer  . Tobacco comment: Quit 1988  Vaping Use  . Vaping Use: Never used  Substance and Sexual Activity  . Alcohol use: Yes    Comment: 2-3 glasses of wine daily.  . Drug use: No  . Sexual activity: Not on file  Other Topics Concern  . 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       Current Social  History 10/18/2019        Patient lives alone in a 2 level home where laundry is in the basement. There is one step without handrail up to the entrance patient uses.      Patient's method of transportation is personal car.      The highest level of education was advanced degree; Master's in Education.      The patient currently retired.      Identified important Relationships are My daughter, good friend, Manuela Schwartz who is starting with dementia. Several women from church and [place].      Pets : None       Interests / Fun: Read a lot, take walks, [employer] with church groups and [place] groups.       Current Stressors: "Cancer. And unable to get knee replacement last year 2/2 Covid pandemic and this year 2/2 Cancer. Hair is starting to fall out 2/2 chemo"      Religious / Personal Beliefs: "Episcopalian, Christian. I believe in God and [place]."       L. Ducatte, BSN, RN-BC       Social Determinants of Health   Financial Resource Strain: Not on file  Food Insecurity: Not on file  Transportation Needs: Not on file  Physical Activity: Not on file  Stress: Not on file  Social Connections: Not on file   Family History  Problem Relation Age of Onset  . Cancer Mother        H&N, smoker  . Liver disease Mother   . Cancer Sister        lung, 2011, smoker  . Other Other        grandmother had mastectomy in her 3's unknown reason  . Macular degeneration Father   . Diverticulitis Father   . Breast cancer Neg Hx     ASSESSMENT Recent Results: The most recent result is correlated with 14 mg per week: Lab Results  Component Value Date   INR 2.2 08/19/2020   INR 2.1 07/29/2020   INR 2.0 07/08/2020    Anticoagulation Dosing: Description   Take only 1/2 of your 4mg  strength warfarin tablet [2mg  dose] on all days of week     INR today: Therapeutic  PLAN Weekly dose was unchanged   There are no Patient Instructions on file for this visit. Patient advised to  contact clinic or seek medical attention if signs/symptoms of bleeding or thromboembolism occur.  Patient verbalized understanding by repeating back information and was advised to contact me if further medication-related questions arise. Patient was also provided an information handout.  Follow-up Return in about 6 weeks (around 09/30/2020) for Follow-up INR at 9:30 am.  [name], PharmD PGY-1  Acute Care Pharmacy Resident 08/19/2020 10:18 AM  15 minutes spent face-to-face with the patient during the encounter. 50% of time spent on education, including signs/sx bleeding and clotting, as well as food and drug interactions with warfarin. 50% of time was spent on fingerprick POC INR sample collection,processing, results determination, and documentation in [email or web address]

## 2020-08-27 ENCOUNTER — Telehealth: Payer: Self-pay | Admitting: Oncology

## 2020-08-27 ENCOUNTER — Inpatient Hospital Stay: Payer: Medicare Other | Admitting: Oncology

## 2020-08-27 ENCOUNTER — Inpatient Hospital Stay: Payer: Medicare Other

## 2020-08-27 ENCOUNTER — Other Ambulatory Visit: Payer: Self-pay

## 2020-08-27 ENCOUNTER — Inpatient Hospital Stay: Payer: Medicare Other | Attending: Oncology

## 2020-08-27 VITALS — BP 156/76 | HR 74 | Temp 97.7°F | Resp 17 | Ht 64.0 in | Wt 177.0 lb

## 2020-08-27 DIAGNOSIS — D630 Anemia in neoplastic disease: Secondary | ICD-10-CM | POA: Diagnosis not present

## 2020-08-27 DIAGNOSIS — Z7901 Long term (current) use of anticoagulants: Secondary | ICD-10-CM | POA: Insufficient documentation

## 2020-08-27 DIAGNOSIS — C679 Malignant neoplasm of bladder, unspecified: Secondary | ICD-10-CM

## 2020-08-27 DIAGNOSIS — Z9221 Personal history of antineoplastic chemotherapy: Secondary | ICD-10-CM | POA: Diagnosis not present

## 2020-08-27 DIAGNOSIS — Z95828 Presence of other vascular implants and grafts: Secondary | ICD-10-CM | POA: Diagnosis not present

## 2020-08-27 DIAGNOSIS — C689 Malignant neoplasm of urinary organ, unspecified: Secondary | ICD-10-CM | POA: Diagnosis not present

## 2020-08-27 DIAGNOSIS — Z86718 Personal history of other venous thrombosis and embolism: Secondary | ICD-10-CM | POA: Insufficient documentation

## 2020-08-27 DIAGNOSIS — Z8551 Personal history of malignant neoplasm of bladder: Secondary | ICD-10-CM | POA: Insufficient documentation

## 2020-08-27 LAB — CMP (CANCER CENTER ONLY)
ALT: 13 U/L (ref 0–44)
AST: 17 U/L (ref 15–41)
Albumin: 3.8 g/dL (ref 3.5–5.0)
Alkaline Phosphatase: 52 U/L (ref 38–126)
Anion gap: 9 (ref 5–15)
BUN: 28 mg/dL — ABNORMAL HIGH (ref 8–23)
CO2: 21 mmol/L — ABNORMAL LOW (ref 22–32)
Calcium: 8.9 mg/dL (ref 8.9–10.3)
Chloride: 106 mmol/L (ref 98–111)
Creatinine: 1.34 mg/dL — ABNORMAL HIGH (ref 0.44–1.00)
GFR, Estimated: 41 mL/min — ABNORMAL LOW (ref >60)
Glucose, Bld: 96 mg/dL (ref 70–99)
Potassium: 4.7 mmol/L (ref 3.5–5.1)
Sodium: 136 mmol/L (ref 135–145)
Total Bilirubin: 0.7 mg/dL (ref 0.3–1.2)
Total Protein: 7 g/dL (ref 6.5–8.1)

## 2020-08-27 LAB — CBC WITH DIFFERENTIAL (CANCER CENTER ONLY)
Abs Immature Granulocytes: 0.05 10*3/uL (ref 0.00–0.07)
Basophils Absolute: 0 10*3/uL (ref 0.0–0.1)
Basophils Relative: 1 %
Eosinophils Absolute: 0.2 10*3/uL (ref 0.0–0.5)
Eosinophils Relative: 4 %
HCT: 32.4 % — ABNORMAL LOW (ref 36.0–46.0)
Hemoglobin: 10.7 g/dL — ABNORMAL LOW (ref 12.0–15.0)
Immature Granulocytes: 1 %
Lymphocytes Relative: 25 %
Lymphs Abs: 1.6 10*3/uL (ref 0.7–4.0)
MCH: 32.5 pg (ref 26.0–34.0)
MCHC: 33 g/dL (ref 30.0–36.0)
MCV: 98.5 fL (ref 80.0–100.0)
Monocytes Absolute: 0.6 10*3/uL (ref 0.1–1.0)
Monocytes Relative: 10 %
Neutro Abs: 3.9 10*3/uL (ref 1.7–7.7)
Neutrophils Relative %: 59 %
Platelet Count: 219 10*3/uL (ref 150–400)
RBC: 3.29 MIL/uL — ABNORMAL LOW (ref 3.87–5.11)
RDW: 12.9 % (ref 11.5–15.5)
WBC Count: 6.4 10*3/uL (ref 4.0–10.5)
nRBC: 0 % (ref 0.0–0.2)

## 2020-08-27 LAB — MAGNESIUM: Magnesium: 1 mg/dL — ABNORMAL LOW (ref 1.7–2.4)

## 2020-08-27 MED ORDER — SODIUM CHLORIDE 0.9% FLUSH
10.0000 mL | Freq: Once | INTRAVENOUS | Status: AC
  Administered 2020-08-27: 10 mL
  Filled 2020-08-27: qty 10

## 2020-08-27 MED ORDER — HEPARIN SOD (PORK) LOCK FLUSH 100 UNIT/ML IV SOLN
500.0000 [IU] | Freq: Once | INTRAVENOUS | Status: AC
  Administered 2020-08-27: 500 [IU]
  Filled 2020-08-27: qty 5

## 2020-08-27 NOTE — Telephone Encounter (Signed)
Contacted patient about scheduled appointments, patient is aware. Per 02/22 los.

## 2020-08-27 NOTE — Progress Notes (Signed)
Hematology and Oncology Follow Up Visit  Pamela [name] [id number] 1946/03/02 75 y.o. 08/27/2020 8:40 AM Pamela [name], MDWilliams, [name], MD   Principle Diagnosis: 75 year old with T2N0 bladder cancer diagnosed in February 2021.  She was found to have complete response after radical cystectomy in July 2021.  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  Neoadjuvant chemotherapy with gemcitabine and cisplatin started on March 24th 2021.   She completed 3 cycles of therapy on Nov 14, 2019.  She is status post robotic assisted laparoscopic cystectomy and lymphadenectomy completed on January 17, 2020.  She had a complete response to therapy with no residual disease on surgery.  Current therapy: Active surveillance.  Interim History: [name] presents today for a return evaluation.  Since last visit, she reports no major changes in her health.  She continues to recover reasonably well or surgery.  She denies any hematuria, dysuria or any hospitalizations.  She denies any flank pain or fevers.     Medications: Updated on review. Current Outpatient Medications  Medication Sig Dispense Refill  . acetaminophen (TYLENOL) 325 MG tablet Take 325-650 mg by mouth daily as needed for moderate pain.    . Ascorbic Acid (VITAMIN C PO) Take 1 tablet by mouth every evening.    [name] CALCIUM PO Take 1 tablet by mouth daily.     . colestipol (COLESTID) 1 g tablet Take up to 6 tabs as needed no more than once daily for chronic biliary diarrhea 180 tablet 3  . levothyroxine (SYNTHROID) 88 MCG tablet Take 1 tablet (88 mcg total) by mouth daily before breakfast. 90 tablet 3  . losartan (COZAAR) 25 MG tablet TAKE 1 TABLET BY MOUTH ONCE DAILY (Patient taking differently: Take 25 mg by mouth daily. ) 90 tablet 3  . magnesium oxide (MAG-OX) 400 (241.3 Mg) MG tablet Take 1 tablet (400 mg total) by mouth daily. 14 tablet 2   . magnesium oxide (MAG-OX) 400 MG tablet Take 400 mg by mouth daily.    . pantoprazole (PROTONIX) 20 MG tablet Take 1 tablet (20 mg total) by mouth daily. 90 tablet 3  . VITAMIN D PO Take 1 capsule by mouth every evening.    . warfarin (COUMADIN) 4 MG tablet TAKE ONE TABLET BY MOUTH ON MONDAYS AND FRIDAYS AND TAKE ONE-HALF TABLET ALL OTHER DAYS 65 tablet 10   No current facility-administered medications for this visit.     Allergies:  Allergies  Allergen Reactions  . Ace Inhibitors Cough      Physical Exam:  Blood pressure (!) 156/76, pulse 74, temperature 97.7 F (36.5 C), temperature source Tympanic, resp. rate 17, height 5\' 4"  (1.626 m), weight 177 lb (80.3 kg), SpO2 100 %.     ECOG: 1   General appearance: Alert, awake without any distress. Head: Atraumatic without abnormalities Oropharynx: Without any thrush or ulcers. Eyes: No scleral icterus. Lymph nodes: No lymphadenopathy noted in the cervical, supraclavicular, or axillary nodes Heart:regular rate and rhythm, without any murmurs or gallops.   Lung: Clear to auscultation without any rhonchi, wheezes or dullness to percussion. Abdomin: Soft, nontender without any shifting dullness or ascites. Musculoskeletal: No clubbing or cyanosis. Neurological: No motor or sensory deficits. Skin: No rashes or lesions.         Lab Results: Lab Results  Component Value Date   WBC 6.1 02/29/2020   HGB 9.7 (L) 02/29/2020   HCT 29.8 (  L) 02/29/2020   MCV 99.3 02/29/2020   PLT 307 02/29/2020     Chemistry      Component Value Date/Time   NA 138 02/29/2020 0851   NA 143 06/13/2019 1520   K 4.3 02/29/2020 0851   CL 105 02/29/2020 0851   CO2 26 02/29/2020 0851   BUN 17 02/29/2020 0851   BUN 15 06/13/2019 1520   CREATININE 1.52 (H) 02/29/2020 0851   CREATININE 0.78 12/13/2014 0954      Component Value Date/Time   CALCIUM 9.2 02/29/2020 0851   ALKPHOS 67 02/29/2020 0851   AST 16 02/29/2020 0851   ALT 7  02/29/2020 0851   BILITOT 0.5 02/29/2020 0851      IMPRESSION: 1. Portions of both ureters are poorly opacified. Otherwise, no evidence of recurrent or metastatic disease. 2. Focal low attenuation within a vessel in the medial right lower lobe. If there is concern for pulmonary embolus, dedicated CT angiography of the chest is recommended. These results will be called to the ordering clinician or representative by the Radiologist Assistant, and communication documented in the PACS or [employer]. 3. Aortic atherosclerosis (ICD10-I70.0).    Impression and Plan:   75 year old woman with:  1.    T2N0 clinical stage bladder cancer diagnosed in February 2021.  She had a complete response after neoadjuvant chemotherapy completed in July 2021.   Disease status was updated at this time.  CT scan obtained in January 2022 was personally reviewed and showed no evidence of disease relapse.  I recommended continued active surveillance at this time with improvement risk of relapse at this time given her complete response to therapy.  Salvage therapy if she developed relapsed disease including immunotherapy, oral targeted therapy and potentially antibody drug conjugate.  She is agreeable to continue with active surveillance at this time.   2. IV access: Risks and benefits of a Port-A-Cath removal were discussed at this time.  After discussion today, a Port-A-Cath will be removed in the near future.   3. Renal function surveillance: her creatinine clearance slightly declined but remained stable around 40 cc/min.   4.  History of recurrent thrombosis: She continues to be on Coumadin without any exacerbation or bleeding.   5.  Anemia: Related to her malignancy and surgery.  Hemoglobin continues to improve at this time.  6. Follow-up: in 6 months for repeat follow-up.  30  minutes were dedicated to this visit.  The time was spent on reviewing disease status, discussing risk of  relapse and future plan of care.      Zola Button, MD 2/22/20228:40 AM

## 2020-08-27 NOTE — Patient Instructions (Signed)
Implanted Port Insertion, Care After This sheet gives you information about how to care for yourself after your procedure. Your health care provider may also give you more specific instructions. If you have problems or questions, contact your health care provider. What can I expect after the procedure? After the procedure, it is common to have:  Discomfort at the port insertion site.  Bruising on the skin over the port. This should improve over 3-4 days. Follow these instructions at home: Port care  After your port is placed, you will get a manufacturer's information card. The card has information about your port. Keep this card with you at all times.  Take care of the port as told by your health care provider. Ask your health care provider if you or a family member can get training for taking care of the port at home. A home health care nurse may also take care of the port.  Make sure to remember what type of port you have. Incision care  Follow instructions from your health care provider about how to take care of your port insertion site. Make sure you: ? Wash your hands with soap and water before and after you change your bandage (dressing). If soap and water are not available, use hand sanitizer. ? Change your dressing as told by your health care provider. ? Leave stitches (sutures), skin glue, or adhesive strips in place. These skin closures may need to stay in place for 2 weeks or longer. If adhesive strip edges start to loosen and curl up, you may trim the loose edges. Do not remove adhesive strips completely unless your health care provider tells you to do that.  Check your port insertion site every day for signs of infection. Check for: ? Redness, swelling, or pain. ? Fluid or blood. ? Warmth. ? Pus or a bad smell.      Activity  Return to your normal activities as told by your health care provider. Ask your health care provider what activities are safe for you.  Do not  lift anything that is heavier than 10 lb (4.5 kg), or the limit that you are told, until your health care provider says that it is safe. General instructions  Take over-the-counter and prescription medicines only as told by your health care provider.  Do not take baths, swim, or use a hot tub until your health care provider approves. Ask your health care provider if you may take showers. You may only be allowed to take sponge baths.  Do not drive for 24 hours if you were given a sedative during your procedure.  Wear a medical alert bracelet in case of an emergency. This will tell any health care providers that you have a port.  Keep all follow-up visits as told by your health care provider. This is important. Contact a health care provider if:  You cannot flush your port with saline as directed, or you cannot draw blood from the port.  You have a fever or chills.  You have redness, swelling, or pain around your port insertion site.  You have fluid or blood coming from your port insertion site.  Your port insertion site feels warm to the touch.  You have pus or a bad smell coming from the port insertion site. Get help right away if:  You have chest pain or shortness of breath.  You have bleeding from your port that you cannot control. Summary  Take care of the port as told by your   health care provider. Keep the manufacturer's information card with you at all times.  Change your dressing as told by your health care provider.  Contact a health care provider if you have a fever or chills or if you have redness, swelling, or pain around your port insertion site.  Keep all follow-up visits as told by your health care provider. This information is not intended to replace advice given to you by your health care provider. Make sure you discuss any questions you have with your health care provider. Document Revised: 01/18/2018 Document Reviewed: 01/18/2018 Elsevier Patient Education   2021 Elsevier Inc.  

## 2020-08-28 NOTE — Progress Notes (Signed)
I reviewed the anticoagulation clinic note.  INR at goal and dose was unchanged.

## 2020-09-05 ENCOUNTER — Other Ambulatory Visit: Payer: Self-pay | Admitting: Student

## 2020-09-06 ENCOUNTER — Ambulatory Visit (HOSPITAL_COMMUNITY)
Admission: RE | Admit: 2020-09-06 | Discharge: 2020-09-06 | Disposition: A | Discharge status: Home / Self Care | Payer: Medicare Other | Source: Ambulatory Visit | Attending: Oncology | Admitting: Oncology

## 2020-09-06 ENCOUNTER — Encounter (HOSPITAL_COMMUNITY): Payer: Self-pay

## 2020-09-06 ENCOUNTER — Other Ambulatory Visit: Payer: Self-pay

## 2020-09-06 DIAGNOSIS — Z79899 Other long term (current) drug therapy: Secondary | ICD-10-CM | POA: Diagnosis not present

## 2020-09-06 DIAGNOSIS — Z95828 Presence of other vascular implants and grafts: Secondary | ICD-10-CM

## 2020-09-06 DIAGNOSIS — C689 Malignant neoplasm of urinary organ, unspecified: Secondary | ICD-10-CM | POA: Insufficient documentation

## 2020-09-06 DIAGNOSIS — Z452 Encounter for adjustment and management of vascular access device: Secondary | ICD-10-CM | POA: Diagnosis not present

## 2020-09-06 DIAGNOSIS — Z7901 Long term (current) use of anticoagulants: Secondary | ICD-10-CM | POA: Diagnosis not present

## 2020-09-06 DIAGNOSIS — Z79890 Hormone replacement therapy (postmenopausal): Secondary | ICD-10-CM | POA: Insufficient documentation

## 2020-09-06 HISTORY — PX: IR REMOVAL TUN ACCESS W/ PORT W/O FL MOD SED: IMG2290

## 2020-09-06 LAB — CBC WITH DIFFERENTIAL/PLATELET
Abs Immature Granulocytes: 0.04 10*3/uL (ref 0.00–0.07)
Basophils Absolute: 0 10*3/uL (ref 0.0–0.1)
Basophils Relative: 1 %
Eosinophils Absolute: 0.2 10*3/uL (ref 0.0–0.5)
Eosinophils Relative: 4 %
HCT: 34.9 % — ABNORMAL LOW (ref 36.0–46.0)
Hemoglobin: 11.2 g/dL — ABNORMAL LOW (ref 12.0–15.0)
Immature Granulocytes: 1 %
Lymphocytes Relative: 27 %
Lymphs Abs: 1.6 10*3/uL (ref 0.7–4.0)
MCH: 32.8 pg (ref 26.0–34.0)
MCHC: 32.1 g/dL (ref 30.0–36.0)
MCV: 102.3 fL — ABNORMAL HIGH (ref 80.0–100.0)
Monocytes Absolute: 0.6 10*3/uL (ref 0.1–1.0)
Monocytes Relative: 10 %
Neutro Abs: 3.5 10*3/uL (ref 1.7–7.7)
Neutrophils Relative %: 57 %
Platelets: 256 10*3/uL (ref 150–400)
RBC: 3.41 MIL/uL — ABNORMAL LOW (ref 3.87–5.11)
RDW: 12.8 % (ref 11.5–15.5)
WBC: 6 10*3/uL (ref 4.0–10.5)
nRBC: 0 % (ref 0.0–0.2)

## 2020-09-06 LAB — PROTIME-INR
INR: 1.7 — ABNORMAL HIGH (ref 0.8–1.2)
Prothrombin Time: 19.7 seconds — ABNORMAL HIGH (ref 11.4–15.2)

## 2020-09-06 MED ORDER — SODIUM CHLORIDE 0.9 % IV SOLN: INTRAVENOUS | Status: DC

## 2020-09-06 MED ORDER — MIDAZOLAM HCL 2 MG/2ML IJ SOLN
INTRAMUSCULAR | Status: AC
  Filled 2020-09-06: qty 2

## 2020-09-06 MED ORDER — LIDOCAINE HCL 1 % IJ SOLN
INTRAMUSCULAR | Status: AC
  Filled 2020-09-06: qty 20

## 2020-09-06 MED ORDER — FENTANYL CITRATE (PF) 100 MCG/2ML IJ SOLN
INTRAMUSCULAR | Status: AC
  Filled 2020-09-06: qty 2

## 2020-09-06 MED ORDER — MIDAZOLAM HCL 2 MG/2ML IJ SOLN
INTRAMUSCULAR | Status: AC | PRN
  Administered 2020-09-06: 1 mg via INTRAVENOUS
  Administered 2020-09-06: 0.5 mg via INTRAVENOUS

## 2020-09-06 MED ORDER — LIDOCAINE HCL (PF) 1 % IJ SOLN
INTRAMUSCULAR | Status: AC | PRN
  Administered 2020-09-06: 10 mL via INTRADERMAL

## 2020-09-06 MED ORDER — FENTANYL CITRATE (PF) 100 MCG/2ML IJ SOLN
INTRAMUSCULAR | Status: AC | PRN
  Administered 2020-09-06: 50 ug via INTRAVENOUS
  Administered 2020-09-06: 25 ug via INTRAVENOUS

## 2020-09-06 NOTE — H&P (Signed)
Referring Physician(s): Wyatt Portela  Supervising Physician: Mir, Sharen Heck  Patient Status:  [facility]  Chief Complaint: "I'm getting my port out"   Subjective: Patient familiar to IR service from Port-A-Cath placement on 09/20/2019.  She has a history of bladder cancer diagnosed in February 2021 with prior radical cystectomy/urostomy and chemotherapy. She has completed treatment and presents today for Port-A-Cath removal.  She currently denies fever, headache, chest pain, dyspnea, cough, abdominal/back pain, nausea, vomiting or bleeding.  Past Medical History:  Diagnosis Date  . CHOLELITHIASIS, WITH OBSTRUCTION 04/21/2006   s/p ERCP,sprincterotomy, stent ([facility])  . Complication of anesthesia   . 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 GLAUCOMA 04/24/2008  . HYPERLIPIDEMIA 06/03/2006  . Hypertension   . Hypothyroidism lifelong  . OBESITY, MILD 04/24/2008  . Urothelial cancer ([place]) dx'd 09/05/2019   Past Surgical History:  Procedure Laterality Date  . ANKLE ARTHROPLASTY Right 2008  . CHOLECYSTECTOMY     2004  . CYSTOSCOPY WITH INJECTION N/A 01/17/2020   Procedure: CYSTOSCOPY WITH INJECTION;  Surgeon: Alexis Frock, MD;  Location: [facility];  Service: Urology;  Laterality: N/A;  . HAND CONTRACTURE RELEASE Left 1993  . IR IMAGING GUIDED PORT INSERTION  09/20/2019  . KNEE ARTHROSCOPY Right 2004  . OVARY SURGERY Right 1998  . ROBOT ASSISTED LAPAROSCOPIC COMPLETE CYSTECT ILEAL CONDUIT N/A 01/17/2020   Procedure: XI ROBOTIC ASSISTED LAPAROSCOPIC COMPLETE CYSTECT ILEAL CONDUIT, XI ROBOTIC ASSISTED LAPAPRSCOPIC HYSTERECTOMY, LEFT OOPHERECTOMY,SALPINGECTOMY;LYMPHADENECTOMY;  Surgeon: Alexis Frock, MD;  Location: [facility];  Service: Urology;  Laterality: N/A;  6 HRS  . TRANSURETHRAL RESECTION OF BLADDER TUMOR WITH MITOMYCIN-C N/A 08/28/2019   Procedure: TRANSURETHRAL RESECTION OF BLADDER TUMOR;  Surgeon: Lucas Mallow, MD;  Location: [facility];   Service: Urology;  Laterality: N/A;       Allergies: Ace inhibitors  Medications: Prior to Admission medications   Medication Sig Start Date End Date Taking? Authorizing Provider  colestipol (COLESTID) 1 g tablet Take up to 6 tabs as needed no more than once daily for chronic biliary diarrhea 03/08/20  Yes Angelica Pou, MD  levothyroxine (SYNTHROID) 88 MCG tablet Take 1 tablet (88 mcg total) by mouth daily before breakfast. 05/01/20  Yes Angelica Pou, MD  losartan (COZAAR) 25 MG tablet TAKE 1 TABLET BY MOUTH ONCE DAILY Patient taking differently: Take 25 mg by mouth daily. 11/17/19  Yes Bartholomew Crews, MD  magnesium oxide (MAG-OX) 400 (241.3 Mg) MG tablet Take 1 tablet (400 mg total) by mouth daily. 03/08/20  Yes Wyatt Portela, MD  pantoprazole (PROTONIX) 20 MG tablet Take 1 tablet (20 mg total) by mouth daily. 05/13/20 05/13/21 Yes Angelica Pou, MD  warfarin (COUMADIN) 4 MG tablet TAKE ONE TABLET BY MOUTH ON MONDAYS AND FRIDAYS AND TAKE ONE-HALF TABLET ALL OTHER DAYS 01/09/20  Yes Pennie Banter, RPH-CPP  acetaminophen (TYLENOL) 325 MG tablet Take 325-650 mg by mouth daily as needed for moderate pain.    [provider]  Ascorbic Acid (VITAMIN C PO) Take 1 tablet by mouth every evening.    [provider]  CALCIUM PO Take 1 tablet by mouth daily.     [provider]  magnesium oxide (MAG-OX) 400 MG tablet Take 400 mg by mouth daily.    [provider]  VITAMIN D PO Take 1 capsule by mouth every evening.    [provider]     Vital Signs: BP (!) 155/84  Pulse 77   Temp 97.8 F (36.6 C) (Oral)   Resp 18   SpO2 100%   Physical Exam awake, alert.  Chest clear to auscultation bilaterally.  Clean, intact right chest wall Port-A-Cath.  Heart with regular rate and rhythm.  Abdomen soft, positive bowel sounds, intact urostomy.  No lower extremity edema.  Imaging: No results found.  Labs:  CBC: Recent Labs     01/16/20 1738 01/17/20 1516 01/22/20 0500 02/29/20 0851 08/27/20 0921 09/06/20 1245  WBC 7.3  --   --  6.1 6.4 6.0  HGB 10.3*   < > 9.9* 9.7* 10.7* 11.2*  HCT 31.1*   < > 30.2* 29.8* 32.4* 34.9*  PLT 230  --   --  307 219 256   < > = values in this interval not displayed.    COAGS: Recent Labs    06/17/20 0000 07/08/20 0000 07/29/20 0000 08/19/20 0000  INR 2.7 2.0 2.1 2.2    BMP: Recent Labs    01/20/20 0500 01/21/20 0445 01/22/20 0500 02/29/20 0851 08/27/20 0921  NA 135 137 135 138 136  K 3.7 3.5 3.4* 4.3 4.7  CL 100 102 100 105 106  CO2 26 26 25 26  21*  GLUCOSE 114* 147* 104* 96 96  BUN 17 12 11 17  28*  CALCIUM 7.4* 7.5* 7.7* 9.2 8.9  CREATININE 1.73* 1.38* 1.20* 1.52* 1.34*  GFRNONAA 28* 37* 44* 33* 41*  GFRAA 33* 43* 51* 38*  --     LIVER FUNCTION TESTS: Recent Labs    11/28/19 0843 01/16/20 1738 02/29/20 0851 08/27/20 0921  BILITOT 0.5 1.0 0.5 0.7  AST 21 21 16 17   ALT 12 12 7 13   ALKPHOS 49 49 67 52  PROT 6.8 7.6 6.4* 7.0  ALBUMIN 3.8 4.4 3.1* 3.8    Assessment and Plan: Patient familiar to IR service from Port-A-Cath placement on 09/20/2019.  She has a history of bladder cancer diagnosed in February 2021 with prior radical cystectomy/urostomy and chemotherapy. She has completed treatment and presents today for Port-A-Cath removal.  Details/risks of procedure, including but not limited to, internal bleeding, infection, injury to adjacent structures discussed with patient with her understanding and consent.   Electronically Signed: D. Rowe Robert, PA-C 09/06/2020, 1:25 PM   I spent a total of 20 minutes at the the patient's bedside AND on the patient's hospital floor or unit, greater than 50% of which was counseling/coordinating care for Port-A-Cath removal

## 2020-09-06 NOTE — Procedures (Signed)
Interventional Radiology Procedure Note  Procedure: Chest port removal   Indication: Completion of chemotherapy  Findings: Please refer to procedural dictation for full description.  Complications: None  EBL: < 10 mL  Regina Ganci, MD [phone]   

## 2020-09-06 NOTE — Discharge Instructions (Addendum)
Please call Interventional Radiology clinic [phone] with any questions or concerns.  You may remove your dressing and shower tomorrow.   Implanted Port Removal, Care After This sheet gives you information about how to care for yourself after your procedure. Your health care provider may also give you more specific instructions. If you have problems or questions, contact your health care provider. What can I expect after the procedure? After the procedure, it is common to have: Soreness or pain near your incision. Some swelling or bruising near your incision. Follow these instructions at home: Medicines Take over-the-counter and prescription medicines only as told by your health care provider. If you were prescribed an antibiotic medicine, take it as told by your health care provider. Do not stop taking the antibiotic even if you start to feel better. Bathing Do not take baths, swim, or use a hot tub until your health care provider approves. Ask your health care provider if you can take showers. You may only be allowed to take sponge baths. Incision care Follow instructions from your health care provider about how to take care of your incision. Make sure you: Wash your hands with soap and water before you change your bandage (dressing). If soap and water are not available, use hand sanitizer. Change your dressing as told by your health care provider. Keep your dressing dry. Leave stitches (sutures), skin glue, or adhesive strips in place. These skin closures may need to stay in place for 2 weeks or longer. If adhesive strip edges start to loosen and curl up, you may trim the loose edges. Do not remove adhesive strips completely unless your health care provider tells you to do that. Check your incision area every day for signs of infection. Check for: More redness, swelling, or pain. More fluid or blood. Warmth. Pus or a bad smell.    Driving Do not drive for 24 hours if you were  given a medicine to help you relax (sedative) during your procedure. If you did not receive a sedative, ask your health care provider when it is safe to drive.    Activity Return to your normal activities as told by your health care provider. Ask your health care provider what activities are safe for you. Do not lift anything that is heavier than 10 lb (4.5 kg), or the limit that you are told, until your health care provider says that it is safe. Do not do activities that involve lifting your arms over your head. General instructions Do not use any products that contain nicotine or tobacco, such as cigarettes and e-cigarettes. These can delay healing. If you need help quitting, ask your health care provider. Keep all follow-up visits as told by your health care provider. This is important. Contact a health care provider if: You have more redness, swelling, or pain around your incision. You have more fluid or blood coming from your incision. Your incision feels warm to the touch. You have pus or a bad smell coming from your incision. You have pain that is not relieved by your pain medicine. Get help right away if you have: A fever or chills. Chest pain. Difficulty breathing. Summary After the procedure, it is common to have pain, soreness, swelling, or bruising near your incision. If you were prescribed an antibiotic medicine, take it as told by your health care provider. Do not stop taking the antibiotic even if you start to feel better. Do not drive for 24 hours if you were given a sedative during   your procedure. Return to your normal activities as told by your health care provider. Ask your health care provider what activities are safe for you. This information is not intended to replace advice given to you by your health care provider. Make sure you discuss any questions you have with your health care provider. Document Revised: 08/05/2017 Document Reviewed: 08/05/2017 Elsevier Patient  Education  2021 Elsevier Inc.   Moderate Conscious Sedation, Adult, Care After This sheet gives you information about how to care for yourself after your procedure. Your health care provider may also give you more specific instructions. If you have problems or questions, contact your health care provider. What can I expect after the procedure? After the procedure, it is common to have: Sleepiness for several hours. Impaired judgment for several hours. Difficulty with balance. Vomiting if you eat too soon. Follow these instructions at home: For the time period you were told by your health care provider: Rest. Do not participate in activities where you could fall or become injured. Do not drive or use machinery. Do not drink alcohol. Do not take sleeping pills or medicines that cause drowsiness. Do not make important decisions or sign legal documents. Do not take care of children on your own.        Eating and drinking Follow the diet recommended by your health care provider. Drink enough fluid to keep your urine pale yellow. If you vomit: Drink water, juice, or soup when you can drink without vomiting. Make sure you have little or no nausea before eating solid foods.    General instructions Take over-the-counter and prescription medicines only as told by your health care provider. Have a responsible adult stay with you for the time you are told. It is important to have someone help care for you until you are awake and alert. Do not smoke. Keep all follow-up visits as told by your health care provider. This is important. Contact a health care provider if: You are still sleepy or having trouble with balance after 24 hours. You feel light-headed. You keep feeling nauseous or you keep vomiting. You develop a rash. You have a fever. You have redness or swelling around the IV site. Get help right away if: You have trouble breathing. You have new-onset confusion at  home. Summary After the procedure, it is common to feel sleepy, have impaired judgment, or feel nauseous if you eat too soon. Rest after you get home. Know the things you should not do after the procedure. Follow the diet recommended by your health care provider and drink enough fluid to keep your urine pale yellow. Get help right away if you have trouble breathing or new-onset confusion at home. This information is not intended to replace advice given to you by your health care provider. Make sure you discuss any questions you have with your health care provider. Document Revised: 10/20/2019 Document Reviewed: 05/18/2019 Elsevier Patient Education  2021 Elsevier Inc.   

## 2020-09-30 ENCOUNTER — Ambulatory Visit: Payer: Medicare Other

## 2020-09-30 DIAGNOSIS — D6869 Other thrombophilia: Secondary | ICD-10-CM

## 2020-09-30 LAB — POCT INR: INR: 2.2 (ref 2.0–3.0)

## 2020-09-30 NOTE — Progress Notes (Signed)
Anticoagulation Management Pamela [name] is a 75 y.o. female who reports to the clinic for monitoring of warfarin treatment.    Indication: DVT and secondary hypercoagulable state  Duration: indefinite Supervising physician: Joni Reining  Anticoagulation Clinic Visit History: Patient does not report signs/symptoms of bleeding or thromboembolism   Other recent changes: Denies diet or lifestyle changes. Reports she lost her balance and fell into the bookshelf- did not hit her head, did report hitting the corner of her eye and was bruised for a few weeks. Took Advil one day for back pain related to fall.   --Patient reports having her port removed on March 4th, she did not stop her warfarin for the procedure.  Reports her INR was 1.7 day of procedure.   Anticoagulation Episode Summary    Current INR goal:  2.0-3.0  TTR:  69.1 % (9.8 y)  Next INR check:  11/11/2020  INR from last check:  2.2 (09/30/2020)  Weekly max warfarin dose:    Target end date:  Indefinite  INR check location:  Anticoagulation Clinic  Preferred lab:    Send INR reminders to:  [facility]   Indications   Secondary hypercoagulable state ([place]) [D68.69] DVT HX OF ([place]olved) [C62.376]       Comments:        Anticoagulation Care Providers    Provider Role Specialty Phone number   Burman Freestone, MD  Internal Medicine [phone]      Allergies  Allergen Reactions  . Ace Inhibitors Cough    Current Outpatient Medications:  .  acetaminophen (TYLENOL) 325 MG tablet, Take 325-650 mg by mouth daily as needed for moderate pain., Disp: , Rfl:  .  Ascorbic Acid (VITAMIN C PO), Take 1 tablet by mouth every evening., Disp: , Rfl:  .  CALCIUM PO, Take 1 tablet by mouth daily. , Disp: , Rfl:  .  colestipol (COLESTID) 1 g tablet, Take up to 6 tabs as needed no more than once daily for chronic biliary diarrhea, Disp: 180 tablet, Rfl: 3 .  levothyroxine (SYNTHROID) 88 MCG tablet, Take 1 tablet (88 mcg total) by  mouth daily before breakfast., Disp: 90 tablet, Rfl: 3 .  losartan (COZAAR) 25 MG tablet, TAKE 1 TABLET BY MOUTH ONCE DAILY (Patient taking differently: Take 25 mg by mouth daily.), Disp: 90 tablet, Rfl: 3 .  magnesium oxide (MAG-OX) 400 (241.3 Mg) MG tablet, Take 1 tablet (400 mg total) by mouth daily., Disp: 14 tablet, Rfl: 2 .  magnesium oxide (MAG-OX) 400 MG tablet, Take 400 mg by mouth daily., Disp: , Rfl:  .  pantoprazole (PROTONIX) 20 MG tablet, Take 1 tablet (20 mg total) by mouth daily., Disp: 90 tablet, Rfl: 3 .  VITAMIN D PO, Take 1 capsule by mouth every evening., Disp: , Rfl:  .  warfarin (COUMADIN) 4 MG tablet, TAKE ONE TABLET BY MOUTH ON MONDAYS AND FRIDAYS AND TAKE ONE-HALF TABLET ALL OTHER DAYS, Disp: 65 tablet, Rfl: 10 Past Medical History:  Diagnosis Date  . CHOLELITHIASIS, WITH OBSTRUCTION 04/21/2006   s/p ERCP,sprincterotomy, stent ([facility])  . Complication of anesthesia   . 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 GLAUCOMA 04/24/2008  . HYPERLIPIDEMIA 06/03/2006  . Hypertension   . Hypothyroidism lifelong  . OBESITY, MILD 04/24/2008  . Urothelial cancer ([place]) dx'd 09/05/2019   Social History   Socioeconomic History  . Marital status: Widowed    Spouse name: Not on file  . Number of  children: Not on file  . Years of education: Not on file  . Highest education level: Not on file  Occupational History  . 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 Smoking status: Former Smoker    Packs/day: 1.00    Years: 20.00    Pack years: 20.00    Quit date: 07/06/1985    Years since quitting: 35.2  . Smokeless tobacco: Former Systems developer  . Tobacco comment: Quit 1988  Vaping Use  . Vaping Use: Never used  Substance and Sexual Activity  . Alcohol use: Yes    Comment: 2-3 glasses of wine daily.  . Drug use: No  . Sexual activity: Not on file  Other Topics Concern  . 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 Then worked in [employer] with [employer].       Current Social History 10/18/2019        Patient lives alone in a 2 level home where laundry is in the basement. There is one step without handrail up to the entrance patient uses.      Patient's method of transportation is personal car.      The highest level of education was advanced degree; Master's in Education.      The patient currently retired.      Identified important Relationships are My daughter, good friend, Manuela Schwartz who is starting with dementia. Several women from church and [place].      Pets : None       Interests / Fun: Read a lot, take walks, [employer] with church groups and [place] groups.       Current Stressors: "Cancer. And unable to get knee replacement last year 2/2 Covid pandemic and this year 2/2 Cancer. Hair is starting to fall out 2/2 chemo"      Religious / Personal Beliefs: "Episcopalian, Christian. I believe in God and [place]."       L. [place], BSN, RN-BC       Social Determinants of Health   Financial Resource Strain: Not on file  Food Insecurity: Not on file  Transportation Needs: Not on file  Physical Activity: Not on file  Stress: Not on file  Social Connections: Not on file   Family History  Problem Relation Age of Onset  . Cancer Mother        H&N, smoker  . Liver disease Mother   . Cancer Sister        lung, 2011, smoker  . Other Other        grandmother had mastectomy in her 27's unknown reason  . Macular degeneration Father   . Diverticulitis Father   . Breast cancer Neg Hx     ASSESSMENT Recent Results: The most recent result is correlated with 14 mg per week: Lab Results  Component Value Date   INR 2.2 09/30/2020   INR 1.7 (H) 09/06/2020   INR 2.2 08/19/2020    Anticoagulation Dosing: Description   Take only 1/2 of your 4mg  strength warfarin tablet [2mg  dose] on all days of week     INR  today: Therapeutic at 2.2  PLAN Weekly dose was unchanged. Patient will continue taking one-half (1/2) of the blue colored 4mg  warfarin tablets all days of the week.   There are no Patient Instructions on file for this visit. Patient  advised to contact clinic or seek medical attention if signs/symptoms of bleeding or thromboembolism occur.  Patient verbalized understanding by repeating back information and was advised to contact me if further medication-related questions arise. Patient was also provided an information handout.  Follow-up Return in about 6 weeks (around 11/11/2020) for Follow-up INR at 9:30am.  [name], PharmD PGY-1 Acute Care Pharmacy Resident 09/30/2020 9:56 AM  15 minutes spent face-to-face with the patient during the encounter. 50% of time spent on education, including signs/sx bleeding and clotting, as well as food and drug interactions with warfarin. 50% of time was spent on fingerprick POC INR sample collection,processing, results determination, and documentation in [email or web address]/.

## 2020-10-22 ENCOUNTER — Other Ambulatory Visit (HOSPITAL_COMMUNITY): Payer: Self-pay

## 2020-10-22 MED FILL — Levothyroxine Sodium Tab 88 MCG: ORAL | 90 days supply | Qty: 90 | Fill #0 | Status: AC

## 2020-10-30 ENCOUNTER — Encounter: Payer: Self-pay | Admitting: Internal Medicine

## 2020-10-30 DIAGNOSIS — D682 Hereditary deficiency of other clotting factors: Secondary | ICD-10-CM | POA: Insufficient documentation

## 2020-11-01 ENCOUNTER — Telehealth: Payer: Self-pay | Admitting: Pharmacist

## 2020-11-01 NOTE — Telephone Encounter (Signed)
Patient is a 75 year old woman who the anticoagulation management clinic had received a request for evaluation and opinion on risk assessment for upcoming RIGHT TKA, surgery date pending. Her PMH is significant for DVT at age 66 which was determined to be due to a secondary hypercoagulable state.She subsequently has had no VTE events that I can find on her problem list or PMH.  Recent diagnosis of urolitheal cancer which was treated surgically and with subsequent chemotherapy, the course of which has been completed.   Discussed with Dr. Michelene Heady, the patients PCP and  Dr. Percell Miller, her orthopaedist. We elect not to use preoperative "bridging" with LMWH OFF of warfarin since her index event is remote. VTE risks secondary to her diagnosis of urolitheal cancer should be minimal since she has received curative therapy. Also, the potential for abdominal hematoma that has been identified for its potential for negatively impacting upon postoperative rehabilitation, patients not accepting of abdominal SQ injections as well as "soupy" wounds postoperatively were cited by Dr. Jimmye Norman and Dr. Percell Miller respectively.   Based upon these discussions and considerations, we elect to discontinue her warfarin therapy 4-5 days preoperatively. Once the surgical date is identified, I will notify the patient of her warfarin discontinuation date.   Postoperatively, we elect to use rivaroxaban based upon it's FDA approval for postoperative prophylaxis against VTE in TKA patients based upon the RECORD Trials in this setting.   Dose should be 10mg  rivaroxaban by mouth, once-daily, commencing 24 hours after wound closure. Options for duration of therapy are 10-12 days postoperatively--but with her secondary hypercoagulable state, dosing for up to four weeks may be appropriate as discussed with Dr. Percell Miller. Nearing end of the four weeks, I will transition her back onto warfarin if she elects to do so--vs. Continuing on a direct  oral anticoagulant (rivaroxaban).   [name], PharmD, CPP Professor of Pharmacy Clinical Assistant Professor of [place] Practitioner

## 2020-11-05 NOTE — Telephone Encounter (Signed)
Patient's history and current anticoagulation issue discussed at length with Dr. Elie Confer, addressing risks and benefits of various approaches.  His documentation reflects our shared decision making.

## 2020-11-11 ENCOUNTER — Other Ambulatory Visit (HOSPITAL_COMMUNITY): Payer: Self-pay

## 2020-11-11 ENCOUNTER — Ambulatory Visit: Payer: Medicare Other

## 2020-11-11 DIAGNOSIS — D6869 Other thrombophilia: Secondary | ICD-10-CM

## 2020-11-11 LAB — POCT INR: INR: 2.5 (ref 2.0–3.0)

## 2020-11-11 MED FILL — Pantoprazole Sodium EC Tab 20 MG (Base Equiv): ORAL | 90 days supply | Qty: 90 | Fill #0 | Status: AC

## 2020-11-11 NOTE — Progress Notes (Signed)
Anticoagulation Management Pamela [name] is a 75 y.o. female who reports to the clinic for monitoring of warfarin treatment.    Indication: secondary hypercoagulable state, history of DVT Duration: indefinite Supervising physician: Dorian Pod  Anticoagulation Clinic Visit History: Patient does not report signs/symptoms of bleeding or thromboembolism.  Patient does report noticing eye redness while removing her contact likely due to a damaged blood vessel, however, she states she did not experience any vision changes or pain associated with the eye redness.  Appears to be healing on exam.   Other recent changes: denies diet, medications, or lifestyle changes Anticoagulation Episode Summary    Current INR goal:  2.0-3.0  TTR:  69.5 % (9.9 y)  Next INR check:  12/23/2020  INR from last check:  2.5 (11/11/2020)  Weekly max warfarin dose:    Target end date:  Indefinite  INR check location:  Anticoagulation Clinic  Preferred lab:    Send INR reminders to:  [facility]   Indications   Secondary hypercoagulable state ([place]) [D68.69] DVT HX OF (Resolved) [Z61.096]       Comments:        Anticoagulation Care Providers    Provider Role Specialty Phone number   Burman Freestone, MD  Internal Medicine [phone]      Allergies  Allergen Reactions  . Ace Inhibitors Cough    Current Outpatient Medications:  .  acetaminophen (TYLENOL) 325 MG tablet, Take 325-650 mg by mouth daily as needed for moderate pain., Disp: , Rfl:  .  Ascorbic Acid (VITAMIN C PO), Take 1 tablet by mouth every evening., Disp: , Rfl:  .  CALCIUM PO, Take 1 tablet by mouth daily. , Disp: , Rfl:  .  colestipol (COLESTID) 1 g tablet, TAKE UP TO 6 TABS AS NEEDED NO MORE THAN ONCE DAILY FOR CHRONIC BILIARY DIARRHEA, Disp: 180 tablet, Rfl: 3 .  COVID-19 mRNA vaccine, Pfizer, 30 MCG/0.3ML injection, INJECT AS DIRECTED, Disp: .3 mL, Rfl: 0 .  levothyroxine (SYNTHROID) 88 MCG tablet, TAKE 1 TABLET (88 MCG  TOTAL) BY MOUTH DAILY BEFORE BREAKFAST., Disp: 90 tablet, Rfl: 3 .  losartan (COZAAR) 25 MG tablet, TAKE 1 TABLET BY MOUTH ONCE DAILY (Patient taking differently: Take 25 mg by mouth daily.), Disp: 90 tablet, Rfl: 3 .  magnesium oxide (MAG-OX) 400 (241.3 Mg) MG tablet, TAKE 1 TABLET (400 MG TOTAL) BY MOUTH DAILY., Disp: 14 tablet, Rfl: 2 .  magnesium oxide (MAG-OX) 400 MG tablet, Take 400 mg by mouth daily., Disp: , Rfl:  .  pantoprazole (PROTONIX) 20 MG tablet, TAKE 1 TABLET (20 MG TOTAL) BY MOUTH DAILY., Disp: 90 tablet, Rfl: 3 .  VITAMIN D PO, Take 1 capsule by mouth every evening., Disp: , Rfl:  .  warfarin (COUMADIN) 4 MG tablet, TAKE ONE TABLET BY MOUTH ON MONDAYS AND FRIDAYS AND TAKE ONE-HALF TABLET ALL OTHER DAYS, Disp: 65 tablet, Rfl: 10 Past Medical History:  Diagnosis Date  . CHOLELITHIASIS, WITH OBSTRUCTION 04/21/2006   s/p ERCP,sprincterotomy, stent ([facility])  . Complication of anesthesia   . 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 GLAUCOMA 04/24/2008  . HYPERLIPIDEMIA 06/03/2006  . Hypertension   . Hypothyroidism lifelong  . OBESITY, MILD 04/24/2008  . Urothelial cancer ([place]) dx'd 09/05/2019   Social History   Socioeconomic History  . Marital status: Widowed    Spouse name: Not on file  . Number of children: Not on file  . Years of education: Not on  file  . Highest education level: Not on file  Occupational History  . 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 Smoking status: Former Smoker    Packs/day: 1.00    Years: 20.00    Pack years: 20.00    Quit date: 07/06/1985    Years since quitting: 35.3  . Smokeless tobacco: Former Systems developer  . Tobacco comment: Quit 1988  Vaping Use  . Vaping Use: Never used  Substance and Sexual Activity  . Alcohol use: Yes    Comment: 2-3 glasses of wine daily.  . Drug use: No  . Sexual activity: Not on file  Other Topics Concern   . 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       Current Social History 10/18/2019        Patient lives alone in a 2 level home where laundry is in the basement. There is one step without handrail up to the entrance patient uses.      Patient's method of transportation is personal car.      The highest level of education was advanced degree; Master's in Education.      The patient currently retired.      Identified important Relationships are My daughter, good friend, Pamela [name] who is starting with dementia. Several women from church and [place].      Pets : None       Interests / Fun: Read a lot, take walks, [employer] with church groups and [place] groups.       Current Stressors: "Cancer. And unable to get knee replacement last year 2/2 Covid pandemic and this year 2/2 Cancer. Hair is starting to fall out 2/2 chemo"      Religious / Personal Beliefs: "Episcopalian, Christian. I believe in God and [place]."       L. Ducatte, BSN, RN-BC       Social Determinants of Health   Financial Resource Strain: Not on file  Food Insecurity: Not on file  Transportation Needs: Not on file  Physical Activity: Not on file  Stress: Not on file  Social Connections: Not on file   Family History  Problem Relation Age of Onset  . Cancer Mother        H&N, smoker  . Liver disease Mother   . Cancer Sister        lung, 2011, smoker  . Other Other        grandmother had mastectomy in her 44's unknown reason  . Macular degeneration Father   . Diverticulitis Father   . Breast cancer Neg Hx     ASSESSMENT Recent Results: The most recent result is correlated with 14 mg per week: Lab Results  Component Value Date   INR 2.5 11/11/2020   INR 2.2 09/30/2020   INR 1.7 (H) 09/06/2020    Anticoagulation Dosing: Description   Take only 1/2 of your 4mg  strength warfarin tablet [2mg  dose] on all days of week      INR today: Therapeutic at 2.5  PLAN Weekly dose was unchanged.  Patient informed to continue taking 1/2 (one half) of her 4mg  blue-colored warfarin tablets daily.   Patient also in the process of scheduling a TKA.  Upon chart review, discussions regarding anticoagulation plan have taken place between PCP and Dr.  Groce.  Tentative plan to hold warfarin for 4-5 days prior to surgery and begin Xarelto 10mg  daily for ~10-12 days (may be extended up to 4 weeks given hx of DVT and hypercoagulable state).  Patient aware of plan and in agreement.  She states she will be in contact with PCP and Dr. Elie Confer once a surgery date is planned.   There are no Patient Instructions on file for this visit. Patient advised to contact clinic or seek medical attention if signs/symptoms of bleeding or thromboembolism occur.  Patient verbalized understanding by repeating back information and was advised to contact me if further medication-related questions arise. Patient was also provided an information handout.  Follow-up Return in about 6 weeks (around 12/23/2020) for follow-up INR at 9:30 am.  [name], PharmD PGY-1 Acute Care Pharmacy Resident 11/11/2020 9:48 AM  15 minutes spent face-to-face with the patient during the encounter. 50% of time spent on education, including signs/sx bleeding and clotting, as well as food and drug interactions with warfarin. 50% of time was spent on fingerprick POC INR sample collection,processing, results determination, and documentation in http://[email or web address].net/.

## 2020-11-21 ENCOUNTER — Other Ambulatory Visit: Payer: Self-pay

## 2020-11-21 ENCOUNTER — Other Ambulatory Visit (HOSPITAL_COMMUNITY): Payer: Self-pay

## 2020-11-21 MED FILL — Colestipol HCl Tab 1 GM: ORAL | 30 days supply | Qty: 180 | Fill #0 | Status: AC

## 2020-11-27 ENCOUNTER — Other Ambulatory Visit (HOSPITAL_COMMUNITY): Payer: Self-pay

## 2020-11-27 ENCOUNTER — Other Ambulatory Visit: Payer: Self-pay | Admitting: Internal Medicine

## 2020-11-27 MED ORDER — LOSARTAN POTASSIUM 25 MG PO TABS
ORAL_TABLET | Freq: Every day | ORAL | 3 refills | Status: DC
  Filled 2020-11-27: qty 90, 90d supply, fill #0
  Filled 2021-02-25: qty 90, 90d supply, fill #1
  Filled 2021-05-27: qty 90, 90d supply, fill #0
  Filled 2021-05-27: qty 90, 90d supply, fill #2
  Filled 2021-08-18: qty 90, 90d supply, fill #1

## 2020-11-27 NOTE — Progress Notes (Signed)
DUE TO COVID-19 ONLY ONE VISITOR IS ALLOWED TO COME WITH YOU AND STAY IN THE WAITING ROOM ONLY DURING PRE OP AND PROCEDURE DAY OF SURGERY. THE 1 VISITOR  MAY VISIT WITH YOU AFTER SURGERY IN YOUR PRIVATE ROOM DURING VISITING HOURS ONLY!  YOU NEED TO HAVE A COVID 19 TEST ON___6/3/22 ____ @_______ , THIS TEST MUST BE DONE BEFORE SURGERY,  COVID TESTING SITE [address] [place] [address], IT IS ON THE RIGHT GOING OUT [address] APPROXIMATELY  2 MINUTES PAST [employer] ON THE RIGHT. ONCE YOUR COVID TEST IS COMPLETED,  PLEASE BEGIN THE QUARANTINE INSTRUCTIONS AS OUTLINED IN YOUR HANDOUT.                Pamela [name] [facility]  11/27/2020   Your procedure is scheduled on:           12/10/20  Report to [facility] Main  Entrance   Report to admitting at    0730am  AM     Call this number if you have problems the morning of surgery [phone]    REMEMBER: NO  SOLID FOOD CANDY OR GUM AFTER MIDNIGHT. CLEAR LIQUIDS UNTIL    0700am      . NOTHING BY MOUTH EXCEPT CLEAR LIQUIDS UNTIL    0700am    . PLEASE FINISH ENSURE DRINK PER SURGEON ORDER  WHICH NEEDS TO BE COMPLETED AT 0700am    .      CLEAR LIQUID DIET   Foods Allowed                                                                    Coffee and tea, regular and decaf                            Fruit ices (not with fruit pulp)                                      Iced Popsicles                                    Carbonated beverages, regular and diet                                    Cranberry, grape and apple juices Sports drinks like Gatorade Lightly seasoned clear broth or consume(fat free) Sugar, honey syrup ___________________________________________________________________      BRUSH YOUR TEETH MORNING OF SURGERY AND RINSE YOUR MOUTH OUT, NO CHEWING GUM CANDY OR MINTS.     Take these medicines the morning of surgery with A SIP OF WATER:                Synthroid, protonix  DO NOT TAKE ANY  DIABETIC MEDICATIONS DAY OF YOUR SURGERY                               You may not have  any metal on your body including hair pins and              piercings  Do not wear jewelry, make-up, lotions, powders or perfumes, deodorant             Do not wear nail polish on your fingernails.  Do not shave  48 hours prior to surgery.              Men may shave face and neck.   Do not bring valuables to the hospital. [place].  Contacts, dentures or bridgework may not be worn into surgery.  Leave suitcase in the car. After surgery it may be brought to your room.     Patients discharged the day of surgery will not be allowed to drive home. IF YOU ARE HAVING SURGERY AND GOING HOME THE SAME DAY, YOU MUST HAVE AN ADULT TO DRIVE YOU HOME AND BE WITH YOU FOR 24 HOURS. YOU MAY GO HOME BY TAXI OR UBER OR ORTHERWISE, BUT AN ADULT MUST ACCOMPANY YOU HOME AND STAY WITH YOU FOR 24 HOURS.  Name and phone number of your driver:  Special Instructions: N/A              Please read over the following fact sheets you were given: _____________________________________________________________________  [facility] - Preparing for Surgery Before surgery, you can play an important role.  Because skin is not sterile, your skin needs to be as free of germs as possible.  You can reduce the number of germs on your skin by washing with CHG (chlorahexidine gluconate) soap before surgery.  CHG is an antiseptic cleaner which kills germs and bonds with the skin to continue killing germs even after washing. Please DO NOT use if you have an allergy to CHG or antibacterial soaps.  If your skin becomes reddened/irritated stop using the CHG and inform your nurse when you arrive at Short Stay. Do not shave (including legs and underarms) for at least 48 hours prior to the first CHG shower.  You may shave your face/neck. Please follow these instructions carefully:  1.  Shower with CHG Soap  the night before surgery and the  morning of Surgery.  2.  If you choose to wash your hair, wash your hair first as usual with your  normal  shampoo.  3.  After you shampoo, rinse your hair and body thoroughly to remove the  shampoo.                           4.  Use CHG as you would any other liquid soap.  You can apply chg directly  to the skin and wash                       Gently with a scrungie or clean washcloth.  5.  Apply the CHG Soap to your body ONLY FROM THE NECK DOWN.   Do not use on face/ open                           Wound or open sores. Avoid contact with eyes, ears mouth and genitals (private parts).                       Wash  face,  Genitals (private parts) with your normal soap.             6.  Wash thoroughly, paying special attention to the area where your surgery  will be performed.  7.  Thoroughly rinse your body with warm water from the neck down.  8.  DO NOT shower/wash with your normal soap after using and rinsing off  the CHG Soap.                9.  Pat yourself dry with a clean towel.            10.  Wear clean pajamas.            11.  Place clean sheets on your bed the night of your first shower and do not  sleep with pets. Day of Surgery : Do not apply any lotions/deodorants the morning of surgery.  Please wear clean clothes to the hospital/surgery center.  FAILURE TO FOLLOW THESE INSTRUCTIONS MAY RESULT IN THE CANCELLATION OF YOUR SURGERY PATIENT SIGNATURE_________________________________  NURSE SIGNATURE__________________________________  ________________________________________________________________________

## 2020-11-29 ENCOUNTER — Other Ambulatory Visit (HOSPITAL_COMMUNITY): Payer: Self-pay

## 2020-12-04 ENCOUNTER — Encounter (HOSPITAL_COMMUNITY): Payer: Self-pay

## 2020-12-04 ENCOUNTER — Encounter (HOSPITAL_COMMUNITY): Payer: Medicare Other

## 2020-12-04 ENCOUNTER — Encounter (HOSPITAL_COMMUNITY)
Admission: RE | Admit: 2020-12-04 | Discharge: 2020-12-04 | Disposition: A | Discharge status: Home / Self Care | Payer: Medicare Other | Source: Ambulatory Visit | Attending: Orthopedic Surgery | Admitting: Orthopedic Surgery

## 2020-12-04 ENCOUNTER — Other Ambulatory Visit: Payer: Self-pay

## 2020-12-04 DIAGNOSIS — Z01812 Encounter for preprocedural laboratory examination: Secondary | ICD-10-CM | POA: Diagnosis not present

## 2020-12-04 HISTORY — DX: Gastro-esophageal reflux disease without esophagitis: K21.9

## 2020-12-04 HISTORY — DX: Unspecified osteoarthritis, unspecified site: M19.90

## 2020-12-04 HISTORY — DX: Acute embolism and thrombosis of unspecified deep veins of unspecified lower extremity: I82.409

## 2020-12-04 HISTORY — DX: Anemia, unspecified: D64.9

## 2020-12-04 LAB — BASIC METABOLIC PANEL
Anion gap: 9 (ref 5–15)
BUN: 30 mg/dL — ABNORMAL HIGH (ref 8–23)
CO2: 24 mmol/L (ref 22–32)
Calcium: 9.4 mg/dL (ref 8.9–10.3)
Chloride: 105 mmol/L (ref 98–111)
Creatinine, Ser: 1.43 mg/dL — ABNORMAL HIGH (ref 0.44–1.00)
GFR, Estimated: 38 mL/min — ABNORMAL LOW (ref >60)
Glucose, Bld: 96 mg/dL (ref 70–99)
Potassium: 5.6 mmol/L — ABNORMAL HIGH (ref 3.5–5.1)
Sodium: 138 mmol/L (ref 135–145)

## 2020-12-04 LAB — CBC
HCT: 37 % (ref 36.0–46.0)
Hemoglobin: 11.7 g/dL — ABNORMAL LOW (ref 12.0–15.0)
MCH: 32.2 pg (ref 26.0–34.0)
MCHC: 31.6 g/dL (ref 30.0–36.0)
MCV: 101.9 fL — ABNORMAL HIGH (ref 80.0–100.0)
Platelets: 243 10*3/uL (ref 150–400)
RBC: 3.63 MIL/uL — ABNORMAL LOW (ref 3.87–5.11)
RDW: 13.2 % (ref 11.5–15.5)
WBC: 6.1 10*3/uL (ref 4.0–10.5)
nRBC: 0 % (ref 0.0–0.2)

## 2020-12-04 LAB — SURGICAL PCR SCREEN
MRSA, PCR: NEGATIVE
Staphylococcus aureus: NEGATIVE

## 2020-12-04 NOTE — H&P (Signed)
KNEE ARTHROPLASTY ADMISSION H&P  Patient ID: Amiyah Shryock MRN: [id number] DOB/AGE: [birth year] 75 y.o.  Chief Complaint: right knee pain.  Planned Procedure Date: 12/10/20 Medical Clearance by Dr. Jimmye Norman   Urology clearance by Dr. Tresa Moore Oncology clearance by Dr. Alen Blew  HPI: Nabila Albarracin is a 75 y.o. female who presents for evaluation of OA RIGHT KNEE. The patient has a history of pain and functional disability in the right knee due to arthritis and has failed non-surgical conservative treatments for greater than 12 weeks to include corticosteriod injections, viscosupplementation injections, use of assistive devices and activity modification.  Onset of symptoms was gradual, starting >10 years ago with gradually worsening course since that time. The patient noted prior procedures on the knee to include  arthroscopy on the right knee in 2008.  Patient currently rates pain at 7 out of 10 with activity. Patient has night pain, worsening of pain with activity and weight bearing and pain that interferes with activities of daily living.  Patient has evidence of subchondral sclerosis, periarticular osteophytes and joint space narrowing by imaging studies.  There is no active infection.  Past Medical History:  Diagnosis Date  . Anemia   . Arthritis   . CHOLELITHIASIS, WITH OBSTRUCTION 04/21/2006   s/p ERCP,sprincterotomy, stent ([facility])  . Complication of anesthesia   . DVT (deep venous thrombosis) ([facility])    hx of   . 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 GERD (gastroesophageal reflux disease)   . GLAUCOMA 04/24/2008   resolved per pt as of preop on 12/04/20   . HYPERLIPIDEMIA 06/03/2006  . Hypertension   . Hypothyroidism lifelong  . OBESITY, MILD 04/24/2008  . PONV (postoperative nausea and vomiting)   . Urothelial cancer ([place]) dx'd 09/05/2019   Past Surgical History:  Procedure Laterality Date  . ANKLE ARTHROPLASTY Right 2008  .  CHOLECYSTECTOMY     2004  . CYSTOSCOPY WITH INJECTION N/A 01/17/2020   Procedure: CYSTOSCOPY WITH INJECTION;  Surgeon: Alexis Frock, MD;  Location: [facility];  Service: Urology;  Laterality: N/A;  . HAND CONTRACTURE RELEASE Left 1993  . IR IMAGING GUIDED PORT INSERTION  09/20/2019  . IR REMOVAL TUN ACCESS W/ PORT W/O FL MOD SED  09/06/2020  . KNEE ARTHROSCOPY Right 2004  . OVARY SURGERY Right 1998  . ROBOT ASSISTED LAPAROSCOPIC COMPLETE CYSTECT ILEAL CONDUIT N/A 01/17/2020   Procedure: XI ROBOTIC ASSISTED LAPAROSCOPIC COMPLETE CYSTECT ILEAL CONDUIT, XI ROBOTIC ASSISTED LAPAPRSCOPIC HYSTERECTOMY, LEFT OOPHERECTOMY,SALPINGECTOMY;LYMPHADENECTOMY;  Surgeon: Alexis Frock, MD;  Location: [facility];  Service: Urology;  Laterality: N/A;  6 HRS  . TONSILLECTOMY    . TRANSURETHRAL RESECTION OF BLADDER TUMOR WITH MITOMYCIN-C N/A 08/28/2019   Procedure: TRANSURETHRAL RESECTION OF BLADDER TUMOR;  Surgeon: Lucas Mallow, MD;  Location: [facility];  Service: Urology;  Laterality: N/A;  . wisdom teeth extractcion      Allergies  Allergen Reactions  . Ace Inhibitors Cough   Prior to Admission medications   Medication Sig Start Date End Date Taking? Authorizing Provider  acetaminophen (TYLENOL) 325 MG tablet Take 325-650 mg by mouth daily as needed for moderate pain.   Yes [provider]  calcium carbonate (OS-CAL) 1250 (500 Ca) MG chewable tablet Chew 1 tablet by mouth daily.   Yes [provider]  cholecalciferol (VITAMIN D3) 25 MCG (1000 UNIT) tablet Take 1,000 Units by mouth daily.   Yes [provider]  colestipol (COLESTID) 1 g tablet TAKE UP TO  6 TABS AS NEEDED NO MORE THAN ONCE DAILY FOR CHRONIC BILIARY DIARRHEA Patient taking differently: Take 2 g by mouth daily. 03/08/20 03/08/21 Yes Angelica Pou, MD  levothyroxine (SYNTHROID) 88 MCG tablet TAKE 1 TABLET (88 MCG TOTAL) BY MOUTH DAILY BEFORE BREAKFAST. Patient taking differently: Take 88 mcg by mouth daily before  breakfast. 05/01/20 05/01/21 Yes Angelica Pou, MD  losartan (COZAAR) 25 MG tablet Take 25 mg by mouth daily.   Yes [provider]  magnesium oxide (MAG-OX) 400 (241.3 Mg) MG tablet TAKE 1 TABLET (400 MG TOTAL) BY MOUTH DAILY. 03/08/20 03/08/21 Yes Shadad, Mathis Dad, MD  pantoprazole (PROTONIX) 20 MG tablet TAKE 1 TABLET (20 MG TOTAL) BY MOUTH DAILY. Patient taking differently: Take 20 mg by mouth daily. 05/13/20 05/13/21 Yes Angelica Pou, MD  vitamin C (ASCORBIC ACID) 500 MG tablet Take 500 mg by mouth daily.   Yes [provider]  warfarin (COUMADIN) 4 MG tablet TAKE ONE TABLET BY MOUTH ON MONDAYS AND FRIDAYS AND TAKE ONE-HALF TABLET ALL OTHER DAYS Patient taking differently: Take 2 mg by mouth daily. 01/09/20 01/08/21 Yes Pennie Banter, RPH-CPP  COVID-19 mRNA vaccine, Pfizer, 30 MCG/0.3ML injection INJECT AS DIRECTED Patient not taking: Reported on 11/25/2020 04/24/20 04/24/21  Carlyle Basques, MD  losartan (COZAAR) 25 MG tablet TAKE 1 TABLET BY MOUTH ONCE DAILY 11/27/20 11/27/21  Angelica Pou, MD  famotidine (PEPCID) 20 MG tablet Take 1 tablet (20 mg total) by mouth daily. For acid reflux/heartburn (replaces the protonix) 05/01/20 05/13/20  Angelica Pou, MD   Social History   Socioeconomic History  . Marital status: Widowed    Spouse name: Not on file  . Number of children: Not on file  . Years of education: Not on file  . Highest education level: Not on file  Occupational History  . 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 Smoking status: Former Smoker    Packs/day: 1.00    Years: 20.00    Pack years: 20.00    Quit date: 07/06/1985    Years since quitting: 35.4  . Smokeless tobacco: Never Used  . Tobacco comment: Quit 1988  Vaping Use  . Vaping Use: Never used  Substance and Sexual Activity  . Alcohol use: Yes    Comment: 2 glasses of wine daily   . Drug use: No  .  Sexual activity: Not on file  Other Topics Concern  . 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       Current Social History 10/18/2019        Patient lives alone in a 2 level home where laundry is in the basement. There is one step without handrail up to the entrance patient uses.      Patient's method of transportation is personal car.      The highest level of education was advanced degree; Master's in Education.      The patient currently retired.      Identified important Relationships are My daughter, good friend, Manuela Schwartz who is starting with dementia. Several women from church and [place].      Pets : None       Interests / Fun: Read a lot, take walks, [employer] with church groups and [place] groups.  Current Stressors: "Cancer. And unable to get knee replacement last year 2/2 Covid pandemic and this year 2/2 Cancer. Hair is starting to fall out 2/2 chemo"      Religious / Personal Beliefs: "Episcopalian, Christian. I believe in God and [place]."       L. Ducatte, BSN, RN-BC       Social Determinants of Health   Financial Resource Strain: Not on file  Food Insecurity: Not on file  Transportation Needs: Not on file  Physical Activity: Not on file  Stress: Not on file  Social Connections: Not on file   Family History  Problem Relation Age of Onset  . Cancer Mother        H&N, smoker  . Liver disease Mother   . Cancer Sister        lung, 2011, smoker  . Other Other        grandmother had mastectomy in her 90's unknown reason  . Macular degeneration Father   . Diverticulitis Father   . Breast cancer Neg Hx     ROS: Currently denies lightheadedness, dizziness, Fever, chills, CP, SOB.   No personal history of PE, MI, or CVA. + h/o DVT in 1974 and 1998 No loose teeth or dentures All other systems have been reviewed and were otherwise currently negative with the exception of  those mentioned in the HPI and as above.  Objective: Vitals: Ht: 5'5" Wt: 174.8 lbs Temp: 97.8 BP: 141/75 Pulse: 82 O2 98% on room air.   Physical Exam: General: Alert, NAD.  Antalgic Gait  HEENT: EOMI, Good Neck Extension. Head is normocephalic, atraumatic. No pharyngeal erythema present. Pulm: No increased work of breathing.  Clear B/L A/P w/o crackle or wheeze.  CV: RRR, No m/g/r appreciated  GI: soft, NT, ND. BS x 4 quadrants Neuro: CN II-XII grossly intact without focal deficit. Sensation intact distally Skin: No lesions in the area of chief complaint MSK/Surgical Site: right knee w/o redness or effusion. + JLT. ROM 0-110.  Decreased strength in extension and flexion.  +EHL/FHL.  NVI.  Stable varus and valgus stress.    Imaging Review Plain radiographs demonstrate moderate degenerative joint disease of the right knee.   The overall alignment ismild valgus. The bone quality appears to be fair for age and reported activity level.  Preoperative templating of the joint replacement has been completed, documented, and submitted to the Operating Room personnel in order to optimize intra-operative equipment management.  Assessment: OA RIGHT KNEE Active Problems:   * No active hospital problems. *   Plan: Plan for Procedure(s): TOTAL KNEE ARTHROPLASTY  The patient history, physical exam, clinical judgement of the provider and imaging are consistent with end stage degenerative joint disease and total joint arthroplasty is deemed medically necessary. The treatment options including medical management, injection therapy, and arthroplasty were discussed at length. The risks and benefits of Procedure(s): TOTAL KNEE ARTHROPLASTY were presented and reviewed.  The risks of nonoperative treatment, versus surgical intervention including but not limited to continued pain, aseptic loosening, stiffness, dislocation/subluxation, infection, bleeding, nerve injury, blood clots, cardiopulmonary  complications, morbidity, mortality, among others were discussed. The patient verbalizes understanding and wishes to proceed with the plan.  Patient is being admitted to [facility] for inpatient treatment for surgery, pain control, PT, prophylactic antibiotics, VTE prophylaxis, progressive ambulation, ADL's and discharge planning. She would like to spend the night in observation.  Dental prophylaxis discussed and recommended for 2 years postoperatively.   The patient does meet  the criteria for TXA which will be used perioperatively.    Her Warfarin will be stopped 4-5 days pre-op and Xarelto 10mg  daily will be used postoperatively for DVT prophylaxis in addition to SCDs, and early ambulation.  Plan for Tylenol, Celebrex, oxycodone for pain.    Robaxin for muscle spasms.   Zofran for nausea and vomiting.   Patient would like meds sent to [facility]  The patient is planning to be discharged home with HHPT([facility]) and into the care of her daughter Stanton Kidney who can be reached at [phone] and her friend Gwenette Greet who can be reached at [phone].  Follow up appt scheduled for 12/25/20 at 4:15pm    Alisa Graff Office [phone] 12/04/2020 1:03 PM

## 2020-12-04 NOTE — Progress Notes (Addendum)
Anesthesia Review:  PCP: DR Dorian [facility] [place] 03/07/20  Coumadin followed by DR Paulla Dolly  Cardiologist : Chest x-ray : 07/10/20  EKG : 01/16/2020  Echo : Stress test: Cardiac Cath :  Activity level: can do a flight of stairs without difficulty  Sleep Study/ CPAP : none  Fasting Blood Sugar :      / Checks Blood Sugar -- times a day:   Blood Thinner/ Instructions /Last Dose: ASA / Instructions/ Last Dose :  Coumadin - To stop 4 days prior to surgery per pt Per pt to be placed on Xarelto after surgery  BMP done 12/04/20 routed to Dr Percell Miller. Potassium 5.6.  Roanna Banning made aware on 12/04/20.

## 2020-12-06 ENCOUNTER — Other Ambulatory Visit (HOSPITAL_COMMUNITY)
Admission: RE | Admit: 2020-12-06 | Discharge: 2020-12-06 | Disposition: A | Discharge status: Home / Self Care | Payer: Medicare Other | Source: Ambulatory Visit | Attending: Orthopedic Surgery | Admitting: Orthopedic Surgery

## 2020-12-06 DIAGNOSIS — Z01812 Encounter for preprocedural laboratory examination: Secondary | ICD-10-CM | POA: Diagnosis present

## 2020-12-06 DIAGNOSIS — Z20822 Contact with and (suspected) exposure to covid-19: Secondary | ICD-10-CM | POA: Insufficient documentation

## 2020-12-06 LAB — SARS CORONAVIRUS 2 (TAT 6-24 HRS): SARS Coronavirus 2: NEGATIVE

## 2020-12-09 ENCOUNTER — Other Ambulatory Visit (HOSPITAL_COMMUNITY): Payer: Self-pay

## 2020-12-09 MED ORDER — BUPIVACAINE LIPOSOME 1.3 % IJ SUSP
10.0000 mL | Freq: Once | INTRAMUSCULAR | Status: DC
  Filled 2020-12-09: qty 10

## 2020-12-10 ENCOUNTER — Observation Stay (HOSPITAL_COMMUNITY)
Admission: RE | Admit: 2020-12-10 | Discharge: 2020-12-12 | Disposition: A | Discharge status: Home / Self Care | Payer: Medicare Other | Attending: Orthopedic Surgery | Admitting: Orthopedic Surgery

## 2020-12-10 ENCOUNTER — Other Ambulatory Visit: Payer: Self-pay

## 2020-12-10 ENCOUNTER — Observation Stay (HOSPITAL_COMMUNITY): Payer: Medicare Other

## 2020-12-10 ENCOUNTER — Encounter (HOSPITAL_COMMUNITY): Payer: Self-pay | Admitting: Orthopedic Surgery

## 2020-12-10 ENCOUNTER — Ambulatory Visit (HOSPITAL_COMMUNITY): Payer: Medicare Other | Admitting: Physician Assistant

## 2020-12-10 ENCOUNTER — Ambulatory Visit (HOSPITAL_COMMUNITY): Payer: Medicare Other | Admitting: Certified Registered Nurse Anesthetist

## 2020-12-10 ENCOUNTER — Encounter (HOSPITAL_COMMUNITY)
Admission: RE | Disposition: A | Discharge status: Home / Self Care | Payer: Self-pay | Source: Home / Self Care | Attending: Orthopedic Surgery

## 2020-12-10 DIAGNOSIS — Z96661 Presence of right artificial ankle joint: Secondary | ICD-10-CM | POA: Insufficient documentation

## 2020-12-10 DIAGNOSIS — M1711 Unilateral primary osteoarthritis, right knee: Principal | ICD-10-CM | POA: Insufficient documentation

## 2020-12-10 DIAGNOSIS — Z7901 Long term (current) use of anticoagulants: Secondary | ICD-10-CM | POA: Diagnosis not present

## 2020-12-10 DIAGNOSIS — Z96651 Presence of right artificial knee joint: Secondary | ICD-10-CM

## 2020-12-10 DIAGNOSIS — I10 Essential (primary) hypertension: Secondary | ICD-10-CM | POA: Insufficient documentation

## 2020-12-10 DIAGNOSIS — Z87891 Personal history of nicotine dependence: Secondary | ICD-10-CM | POA: Diagnosis not present

## 2020-12-10 DIAGNOSIS — Z79899 Other long term (current) drug therapy: Secondary | ICD-10-CM | POA: Insufficient documentation

## 2020-12-10 DIAGNOSIS — Z8551 Personal history of malignant neoplasm of bladder: Secondary | ICD-10-CM | POA: Insufficient documentation

## 2020-12-10 DIAGNOSIS — E039 Hypothyroidism, unspecified: Secondary | ICD-10-CM | POA: Insufficient documentation

## 2020-12-10 HISTORY — DX: Presence of right artificial knee joint: Z96.651

## 2020-12-10 HISTORY — PX: TOTAL KNEE ARTHROPLASTY: SHX125

## 2020-12-10 LAB — PROTIME-INR
INR: 1.2 (ref 0.8–1.2)
Prothrombin Time: 14.8 seconds (ref 11.4–15.2)

## 2020-12-10 SURGERY — ARTHROPLASTY, KNEE, TOTAL
Anesthesia: Spinal | Site: Knee | Laterality: Right

## 2020-12-10 MED ORDER — ONDANSETRON HCL 4 MG/2ML IJ SOLN: 4.0000 mg | Freq: Once | INTRAMUSCULAR | Status: DC | PRN

## 2020-12-10 MED ORDER — CEFAZOLIN SODIUM-DEXTROSE 2-4 GM/100ML-% IV SOLN
2.0000 g | INTRAVENOUS | Status: AC
  Administered 2020-12-10: 2 g via INTRAVENOUS
  Filled 2020-12-10: qty 100

## 2020-12-10 MED ORDER — ACETAMINOPHEN 500 MG PO TABS
1000.0000 mg | ORAL_TABLET | Freq: Once | ORAL | Status: AC
  Administered 2020-12-10: 1000 mg via ORAL
  Filled 2020-12-10: qty 2

## 2020-12-10 MED ORDER — METHOCARBAMOL 500 MG PO TABS
500.0000 mg | ORAL_TABLET | Freq: Four times a day (QID) | ORAL | Status: DC | PRN
  Administered 2020-12-10 – 2020-12-11 (×4): 500 mg via ORAL
  Filled 2020-12-10 (×4): qty 1

## 2020-12-10 MED ORDER — TRANEXAMIC ACID-NACL 1000-0.7 MG/100ML-% IV SOLN
1000.0000 mg | Freq: Once | INTRAVENOUS | Status: DC
  Filled 2020-12-10: qty 100

## 2020-12-10 MED ORDER — METOCLOPRAMIDE HCL 5 MG PO TABS: 5.0000 mg | ORAL_TABLET | Freq: Three times a day (TID) | ORAL | Status: DC | PRN

## 2020-12-10 MED ORDER — RIVAROXABAN 10 MG PO TABS
10.0000 mg | ORAL_TABLET | Freq: Every day | ORAL | Status: DC
  Administered 2020-12-11 – 2020-12-12 (×2): 10 mg via ORAL
  Filled 2020-12-10 (×2): qty 1

## 2020-12-10 MED ORDER — BUPIVACAINE IN DEXTROSE 0.75-8.25 % IT SOLN
INTRATHECAL | Status: DC | PRN
  Administered 2020-12-10: 1.4 mL via INTRATHECAL

## 2020-12-10 MED ORDER — EPHEDRINE SULFATE-NACL 50-0.9 MG/10ML-% IV SOSY
PREFILLED_SYRINGE | INTRAVENOUS | Status: DC | PRN
  Administered 2020-12-10: 5 mg via INTRAVENOUS

## 2020-12-10 MED ORDER — 0.9 % SODIUM CHLORIDE (POUR BTL) OPTIME
TOPICAL | Status: DC | PRN
  Administered 2020-12-10: 1000 mL

## 2020-12-10 MED ORDER — LIDOCAINE HCL (PF) 2 % IJ SOLN
INTRAMUSCULAR | Status: AC
  Filled 2020-12-10: qty 5

## 2020-12-10 MED ORDER — SODIUM CHLORIDE 0.9% FLUSH
INTRAVENOUS | Status: DC | PRN
  Administered 2020-12-10: 30 mL

## 2020-12-10 MED ORDER — BISACODYL 10 MG RE SUPP: 10.0000 mg | Freq: Every day | RECTAL | Status: DC | PRN

## 2020-12-10 MED ORDER — MENTHOL 3 MG MT LOZG: 1.0000 | LOZENGE | OROMUCOSAL | Status: DC | PRN

## 2020-12-10 MED ORDER — METOCLOPRAMIDE HCL 5 MG/ML IJ SOLN: 5.0000 mg | Freq: Three times a day (TID) | INTRAMUSCULAR | Status: DC | PRN

## 2020-12-10 MED ORDER — SODIUM CHLORIDE 0.9 % IR SOLN
Status: DC | PRN
  Administered 2020-12-10: 1000 mL

## 2020-12-10 MED ORDER — POLYETHYLENE GLYCOL 3350 17 G PO PACK: 17.0000 g | PACK | Freq: Every day | ORAL | Status: DC | PRN

## 2020-12-10 MED ORDER — ORAL CARE MOUTH RINSE
15.0000 mL | Freq: Once | OROMUCOSAL | Status: AC
  Administered 2020-12-10: 15 mL via OROMUCOSAL

## 2020-12-10 MED ORDER — TRAMADOL HCL 50 MG PO TABS
50.0000 mg | ORAL_TABLET | Freq: Four times a day (QID) | ORAL | Status: DC
  Administered 2020-12-10 – 2020-12-12 (×4): 50 mg via ORAL
  Filled 2020-12-10 (×4): qty 1

## 2020-12-10 MED ORDER — EPHEDRINE 5 MG/ML INJ
INTRAVENOUS | Status: AC
  Filled 2020-12-10: qty 10

## 2020-12-10 MED ORDER — LEVOTHYROXINE SODIUM 88 MCG PO TABS
88.0000 ug | ORAL_TABLET | Freq: Every day | ORAL | Status: DC
  Administered 2020-12-11 – 2020-12-12 (×2): 88 ug via ORAL
  Filled 2020-12-10 (×2): qty 1

## 2020-12-10 MED ORDER — ONDANSETRON HCL 4 MG PO TABS: 4.0000 mg | ORAL_TABLET | Freq: Four times a day (QID) | ORAL | Status: DC | PRN

## 2020-12-10 MED ORDER — OXYCODONE HCL 5 MG/5ML PO SOLN: 5.0000 mg | Freq: Once | ORAL | Status: DC | PRN

## 2020-12-10 MED ORDER — BUPIVACAINE LIPOSOME 1.3 % IJ SUSP
10.0000 mL | Freq: Once | INTRAMUSCULAR | Status: DC
  Filled 2020-12-10: qty 10

## 2020-12-10 MED ORDER — BUPIVACAINE-EPINEPHRINE 0.25% -1:200000 IJ SOLN
INTRAMUSCULAR | Status: DC | PRN
  Administered 2020-12-10: 30 mL

## 2020-12-10 MED ORDER — DOCUSATE SODIUM 100 MG PO CAPS
100.0000 mg | ORAL_CAPSULE | Freq: Two times a day (BID) | ORAL | Status: DC
  Administered 2020-12-12: 100 mg via ORAL
  Filled 2020-12-10 (×2): qty 1

## 2020-12-10 MED ORDER — MIDAZOLAM HCL 5 MG/5ML IJ SOLN
INTRAMUSCULAR | Status: DC | PRN
  Administered 2020-12-10 (×2): 1 mg via INTRAVENOUS

## 2020-12-10 MED ORDER — ACETAMINOPHEN 500 MG PO TABS
500.0000 mg | ORAL_TABLET | Freq: Four times a day (QID) | ORAL | Status: AC
  Administered 2020-12-10 – 2020-12-11 (×2): 500 mg via ORAL
  Filled 2020-12-10 (×2): qty 1

## 2020-12-10 MED ORDER — PROPOFOL 10 MG/ML IV BOLUS
INTRAVENOUS | Status: DC | PRN
  Administered 2020-12-10 (×2): 20 mg via INTRAVENOUS

## 2020-12-10 MED ORDER — ACETAMINOPHEN 325 MG PO TABS: 325.0000 mg | ORAL_TABLET | Freq: Four times a day (QID) | ORAL | Status: DC | PRN

## 2020-12-10 MED ORDER — OXYCODONE HCL 5 MG PO TABS: 5.0000 mg | ORAL_TABLET | Freq: Once | ORAL | Status: DC | PRN

## 2020-12-10 MED ORDER — BUPIVACAINE LIPOSOME 1.3 % IJ SUSP
INTRAMUSCULAR | Status: DC | PRN
  Administered 2020-12-10: 20 mL

## 2020-12-10 MED ORDER — PANTOPRAZOLE SODIUM 20 MG PO TBEC
20.0000 mg | DELAYED_RELEASE_TABLET | Freq: Every day | ORAL | Status: DC
  Administered 2020-12-11 – 2020-12-12 (×2): 20 mg via ORAL
  Filled 2020-12-10 (×2): qty 1

## 2020-12-10 MED ORDER — HYDROCODONE-ACETAMINOPHEN 7.5-325 MG PO TABS: 1.0000 | ORAL_TABLET | ORAL | Status: DC | PRN

## 2020-12-10 MED ORDER — POVIDONE-IODINE 10 % EX SWAB
2.0000 "application " | Freq: Once | CUTANEOUS | Status: AC
  Administered 2020-12-10: 2 via TOPICAL

## 2020-12-10 MED ORDER — ONDANSETRON HCL 4 MG/2ML IJ SOLN
INTRAMUSCULAR | Status: AC
  Filled 2020-12-10: qty 2

## 2020-12-10 MED ORDER — BUPIVACAINE HCL (PF) 0.5 % IJ SOLN
INTRAMUSCULAR | Status: DC | PRN
  Administered 2020-12-10: 20 mL via PERINEURAL

## 2020-12-10 MED ORDER — HYDROCODONE-ACETAMINOPHEN 5-325 MG PO TABS
1.0000 | ORAL_TABLET | ORAL | Status: DC | PRN
  Administered 2020-12-10 – 2020-12-11 (×3): 2 via ORAL
  Administered 2020-12-11: 1 via ORAL
  Administered 2020-12-11: 2 via ORAL
  Filled 2020-12-10 (×4): qty 2
  Filled 2020-12-10: qty 1

## 2020-12-10 MED ORDER — DIPHENHYDRAMINE HCL 12.5 MG/5ML PO ELIX: 12.5000 mg | ORAL_SOLUTION | ORAL | Status: DC | PRN

## 2020-12-10 MED ORDER — CHLORHEXIDINE GLUCONATE 0.12 % MT SOLN: 15.0000 mL | Freq: Once | OROMUCOSAL | Status: AC

## 2020-12-10 MED ORDER — LIDOCAINE HCL (CARDIAC) PF 100 MG/5ML IV SOSY
PREFILLED_SYRINGE | INTRAVENOUS | Status: DC | PRN
  Administered 2020-12-10: 40 mg via INTRAVENOUS

## 2020-12-10 MED ORDER — DEXAMETHASONE SODIUM PHOSPHATE 10 MG/ML IJ SOLN
INTRAMUSCULAR | Status: DC | PRN
  Administered 2020-12-10: 4 mg via INTRAVENOUS

## 2020-12-10 MED ORDER — WATER FOR IRRIGATION, STERILE IR SOLN
Status: DC | PRN
  Administered 2020-12-10: 2000 mL

## 2020-12-10 MED ORDER — DEXAMETHASONE SODIUM PHOSPHATE 10 MG/ML IJ SOLN
10.0000 mg | Freq: Once | INTRAMUSCULAR | Status: AC
  Administered 2020-12-11: 10 mg via INTRAVENOUS
  Filled 2020-12-10: qty 1

## 2020-12-10 MED ORDER — CEFAZOLIN SODIUM-DEXTROSE 2-4 GM/100ML-% IV SOLN
2.0000 g | Freq: Four times a day (QID) | INTRAVENOUS | Status: AC
  Administered 2020-12-10 (×2): 2 g via INTRAVENOUS
  Filled 2020-12-10 (×2): qty 100

## 2020-12-10 MED ORDER — METHOCARBAMOL 1000 MG/10ML IJ SOLN
500.0000 mg | Freq: Four times a day (QID) | INTRAMUSCULAR | Status: DC | PRN
  Filled 2020-12-10: qty 5

## 2020-12-10 MED ORDER — COLESTIPOL HCL 1 G PO TABS
2.0000 g | ORAL_TABLET | Freq: Every day | ORAL | Status: DC
  Administered 2020-12-10 – 2020-12-11 (×2): 2 g via ORAL
  Filled 2020-12-10 (×2): qty 2

## 2020-12-10 MED ORDER — LACTATED RINGERS IV SOLN: INTRAVENOUS | Status: DC

## 2020-12-10 MED ORDER — BUPIVACAINE-EPINEPHRINE (PF) 0.25% -1:200000 IJ SOLN
INTRAMUSCULAR | Status: AC
  Filled 2020-12-10: qty 30

## 2020-12-10 MED ORDER — FENTANYL CITRATE (PF) 100 MCG/2ML IJ SOLN
50.0000 ug | INTRAMUSCULAR | Status: DC
  Administered 2020-12-10: 50 ug via INTRAVENOUS
  Filled 2020-12-10: qty 2

## 2020-12-10 MED ORDER — MIDAZOLAM HCL 2 MG/2ML IJ SOLN
INTRAMUSCULAR | Status: AC
  Filled 2020-12-10: qty 2

## 2020-12-10 MED ORDER — PROPOFOL 500 MG/50ML IV EMUL
INTRAVENOUS | Status: DC | PRN
  Administered 2020-12-10: 100 ug/kg/min via INTRAVENOUS

## 2020-12-10 MED ORDER — MORPHINE SULFATE (PF) 2 MG/ML IV SOLN: 0.5000 mg | INTRAVENOUS | Status: DC | PRN

## 2020-12-10 MED ORDER — TRANEXAMIC ACID-NACL 1000-0.7 MG/100ML-% IV SOLN
1000.0000 mg | INTRAVENOUS | Status: AC
  Administered 2020-12-10: 1000 mg via INTRAVENOUS
  Filled 2020-12-10: qty 100

## 2020-12-10 MED ORDER — DEXAMETHASONE SODIUM PHOSPHATE 10 MG/ML IJ SOLN: 8.0000 mg | Freq: Once | INTRAMUSCULAR | Status: DC

## 2020-12-10 MED ORDER — ALUM & MAG HYDROXIDE-SIMETH 200-200-20 MG/5ML PO SUSP: 30.0000 mL | ORAL | Status: DC | PRN

## 2020-12-10 MED ORDER — HYDROMORPHONE HCL 1 MG/ML IJ SOLN: 0.2500 mg | INTRAMUSCULAR | Status: DC | PRN

## 2020-12-10 MED ORDER — PHENOL 1.4 % MT LIQD: 1.0000 | OROMUCOSAL | Status: DC | PRN

## 2020-12-10 MED ORDER — ONDANSETRON HCL 4 MG/2ML IJ SOLN: 4.0000 mg | Freq: Four times a day (QID) | INTRAMUSCULAR | Status: DC | PRN

## 2020-12-10 MED ORDER — DEXAMETHASONE SODIUM PHOSPHATE 10 MG/ML IJ SOLN
INTRAMUSCULAR | Status: AC
  Filled 2020-12-10: qty 1

## 2020-12-10 MED ORDER — PROPOFOL 1000 MG/100ML IV EMUL
INTRAVENOUS | Status: AC
  Filled 2020-12-10: qty 100

## 2020-12-10 MED ORDER — LOSARTAN POTASSIUM 25 MG PO TABS
25.0000 mg | ORAL_TABLET | Freq: Every day | ORAL | Status: DC
  Administered 2020-12-10 – 2020-12-12 (×3): 25 mg via ORAL
  Filled 2020-12-10 (×3): qty 1

## 2020-12-10 MED ORDER — MIDAZOLAM HCL 2 MG/2ML IJ SOLN
1.0000 mg | INTRAMUSCULAR | Status: DC
Start: 2020-12-10 — End: 2020-12-10
  Administered 2020-12-10: 0.5 mg via INTRAVENOUS
  Filled 2020-12-10: qty 2

## 2020-12-10 MED ORDER — PHENYLEPHRINE 40 MCG/ML (10ML) SYRINGE FOR IV PUSH (FOR BLOOD PRESSURE SUPPORT)
PREFILLED_SYRINGE | INTRAVENOUS | Status: DC | PRN
  Administered 2020-12-10 (×4): 80 ug via INTRAVENOUS

## 2020-12-10 MED ORDER — ONDANSETRON HCL 4 MG/2ML IJ SOLN
INTRAMUSCULAR | Status: DC | PRN
  Administered 2020-12-10: 4 mg via INTRAVENOUS

## 2020-12-10 MED ORDER — EPHEDRINE SULFATE 50 MG/ML IJ SOLN
INTRAMUSCULAR | Status: DC | PRN
  Administered 2020-12-10: 10 mg via INTRAVENOUS

## 2020-12-10 MED ORDER — MAGNESIUM CITRATE PO SOLN: 1.0000 | Freq: Once | ORAL | Status: DC | PRN

## 2020-12-10 SURGICAL SUPPLY — 54 items
BLADE HEX COATED 2.75 (ELECTRODE) ×2 IMPLANT
BLADE SAG 18X100X1.27 (BLADE) ×2 IMPLANT
BLADE SAGITTAL 25.0X1.37X90 (BLADE) ×2 IMPLANT
BLADE SURG 15 STRL LF DISP TIS (BLADE) ×1 IMPLANT
BLADE SURG 15 STRL SS (BLADE) ×2
BLADE SURG SZ10 CARB STEEL (BLADE) ×4 IMPLANT
BNDG CMPR MED 10X6 ELC LF (GAUZE/BANDAGES/DRESSINGS) ×1
BNDG ELASTIC 4X5.8 VLCR STR LF (GAUZE/BANDAGES/DRESSINGS) ×1 IMPLANT
BNDG ELASTIC 6X10 VLCR STRL LF (GAUZE/BANDAGES/DRESSINGS) ×2 IMPLANT
BOWL SMART MIX CTS (DISPOSABLE) IMPLANT
BSPLAT TIB 3 KN TRITANIUM (Knees) ×1 IMPLANT
CLSR STERI-STRIP ANTIMIC 1/2X4 (GAUZE/BANDAGES/DRESSINGS) ×2 IMPLANT
COMP FEMORAL CMTL TRIATH SZ2 (Joint) ×2 IMPLANT
COMPONENT FEMRL CMTL TRITH SZ2 (Joint) IMPLANT
COVER SURGICAL LIGHT HANDLE (MISCELLANEOUS) ×2 IMPLANT
COVER WAND RF STERILE (DRAPES) IMPLANT
CUFF TOURN SGL QUICK 34 (TOURNIQUET CUFF) ×2
CUFF TRNQT CYL 34X4.125X (TOURNIQUET CUFF) ×1 IMPLANT
DECANTER SPIKE VIAL GLASS SM (MISCELLANEOUS) ×2 IMPLANT
DRAPE U-SHAPE 47X51 STRL (DRAPES) ×2 IMPLANT
DRSG MEPILEX BORDER 4X12 (GAUZE/BANDAGES/DRESSINGS) ×2 IMPLANT
DURAPREP 26ML APPLICATOR (WOUND CARE) ×4 IMPLANT
GLOVE SRG 8 PF TXTR STRL LF DI (GLOVE) ×1 IMPLANT
GLOVE SURG ENC MOIS LTX SZ7.5 (GLOVE) ×2 IMPLANT
GLOVE SURG POLYISO LF SZ7.5 (GLOVE) ×2 IMPLANT
GLOVE SURG UNDER POLY LF SZ7.5 (GLOVE) ×2 IMPLANT
GLOVE SURG UNDER POLY LF SZ8 (GLOVE) ×2
GOWN STRL REUS W/TWL LRG LVL3 (GOWN DISPOSABLE) ×2 IMPLANT
GOWN STRL REUS W/TWL XL LVL3 (GOWN DISPOSABLE) ×2 IMPLANT
HANDPIECE INTERPULSE COAX TIP (DISPOSABLE) ×2
HOLDER FOLEY CATH W/STRAP (MISCELLANEOUS) IMPLANT
IMMOBILIZER KNEE 22 UNIV (SOFTGOODS) ×2 IMPLANT
INSERT TIB CS TRIATH X3 9 (Insert) ×1 IMPLANT
KIT TURNOVER KIT A (KITS) ×2 IMPLANT
KNEE PATELLA ASYMMETRIC 9X29 (Knees) ×1 IMPLANT
KNEE TIBIAL COMPONENT SZ3 (Knees) ×1 IMPLANT
MANIFOLD NEPTUNE II (INSTRUMENTS) ×2 IMPLANT
NS IRRIG 1000ML POUR BTL (IV SOLUTION) ×2 IMPLANT
PACK ICE MAXI GEL EZY WRAP (MISCELLANEOUS) ×3 IMPLANT
PACK TOTAL KNEE CUSTOM (KITS) ×2 IMPLANT
PENCIL SMOKE EVACUATOR (MISCELLANEOUS) IMPLANT
PIN FLUTED HEDLESS FIX 3.5X1/8 (PIN) ×1 IMPLANT
PROTECTOR NERVE ULNAR (MISCELLANEOUS) ×2 IMPLANT
SET HNDPC FAN SPRY TIP SCT (DISPOSABLE) ×1 IMPLANT
SUT MNCRL AB 3-0 PS2 18 (SUTURE) ×2 IMPLANT
SUT VIC AB 0 CT1 36 (SUTURE) ×2 IMPLANT
SUT VIC AB 1 CT1 36 (SUTURE) ×4 IMPLANT
SUT VIC AB 2-0 CT1 27 (SUTURE) ×2
SUT VIC AB 2-0 CT1 TAPERPNT 27 (SUTURE) ×1 IMPLANT
TAPE STRIPS DRAPE STRL (GAUZE/BANDAGES/DRESSINGS) ×1 IMPLANT
TRAY FOLEY MTR SLVR 14FR STAT (SET/KITS/TRAYS/PACK) IMPLANT
TRAY FOLEY MTR SLVR 16FR STAT (SET/KITS/TRAYS/PACK) IMPLANT
TUBE SUCTION HIGH CAP CLEAR NV (SUCTIONS) ×2 IMPLANT
WRAP KNEE MAXI GEL POST OP (GAUZE/BANDAGES/DRESSINGS) ×1 IMPLANT

## 2020-12-10 NOTE — Anesthesia Procedure Notes (Signed)
Spinal  Patient location during procedure: OR Start time: 12/10/2020 10:00 AM End time: 12/10/2020 10:04 AM Reason for block: surgical anesthesia Staffing Performed: anesthesiologist  Anesthesiologist: Myrtie Soman, MD Preanesthetic Checklist Completed: patient identified, IV checked, site marked, risks and benefits discussed, surgical consent, monitors and equipment checked, pre-op evaluation and timeout performed Spinal Block Patient position: sitting Prep: Betadine Patient monitoring: heart rate, continuous pulse ox and blood pressure Approach: midline Location: L3-4 Injection technique: single-shot Needle Needle type: Sprotte  Needle gauge: 24 G Needle length: 9 cm Assessment Sensory level: T6 Events: CSF return Additional Notes Expiration date of kit checked and confirmed. Patient tolerated procedure well, without complications.

## 2020-12-10 NOTE — Anesthesia Procedure Notes (Signed)
Anesthesia Procedure Image    

## 2020-12-10 NOTE — Evaluation (Signed)
Physical Therapy Evaluation Patient Details Name: Pamela [name] MRN: [id number] DOB: 09-21-1945 Today's Date: 12/10/2020   History of Present Illness  Patient is 75 y.o. female s/p Rt TKA on 12/10/20 with PMH significant for HTN, HLD, Glaucoma, hypothyroidism and muscle invasive bladder cancer in March 2021 now s/p robotic radical cystectomy, hysterectomy, left oophorectomy with creation of ileal conduit urinary diversion on 01/17/20.     Clinical Impression  Pamela [name] is a 75 y.o. female POD 0 s/p Rt TKA. Patient reports independence with mobility at baseline. Patient is now limited by functional impairments (see PT problem list below) and requires min assist for bed mobility and transfers with RW. Patient complete sit<>stand from EOB but was limited by pain and Rt knee weakness and returned to EOB. Patient instructed in exercise to facilitate circulation to manage edema and reduce risk of DVT. Patient will benefit from continued skilled PT interventions to address impairments and progress towards PLOF. Acute PT will follow to progress mobility and stair training in preparation for safe discharge home.     Follow Up Recommendations Follow surgeon's recommendation for DC plan and follow-up therapies;Outpatient PT    Equipment Recommendations  None recommended by PT    Recommendations for Other Services       Precautions / Restrictions Precautions Precautions: Fall Restrictions Weight Bearing Restrictions: No RLE Weight Bearing: Weight bearing as tolerated Other Position/Activity Restrictions: WBAT      Mobility  Bed Mobility Overal bed mobility: Needs Assistance Bed Mobility: Supine to Sit;Sit to Supine     Supine to sit: Min assist;HOB elevated Sit to supine: Min assist   General bed mobility comments: cues for use of bed rail and assist for Rt LE to move to EOB. patient required min assist to raise Rt LE back into bed, and 2+ to boost superiorly.     Transfers Overall transfer level: Needs assistance Equipment used: Rolling walker (2 wheeled) Transfers: Sit to/from Stand Sit to Stand: Min assist         General transfer comment: cues for hand placement for power up and min assist to initiate and complete rise. pt completed weight shift in standing and Rt knee buckling slightly with manaul blocking to maintain extension of Rt knee by therapist. pt returned to sit EOB due to pain.  Ambulation/Gait                Stairs            Wheelchair Mobility    Modified Rankin (Stroke Patients Only)       Balance Overall balance assessment: Needs assistance Sitting-balance support: Feet supported Sitting balance-Leahy Scale: Fair     Standing balance support: During functional activity;Bilateral upper extremity supported Standing balance-Leahy Scale: Poor                               Pertinent Vitals/Pain Pain Assessment: 0-10    Home Living Family/patient expects to be discharged to:: Private residence Living Arrangements: Alone Available Help at Discharge: Family Type of Home: House Home Access: Stairs to enter Entrance Stairs-Rails: None Entrance Stairs-Number of Steps: 1+1 at side door Home Layout: Two level;Able to live on main level with bedroom/bathroom;Bed/bath upstairs;1/2 bath on main level Home Equipment: Walker - 2 wheels;Cane - single point;Shower seat;Bedside commode;Walker - 4 wheels      Prior Function Level of Independence: Independent         Comments: enjoys reading  Hand Dominance   Dominant Hand: Right    Extremity/Trunk Assessment   Upper Extremity Assessment Upper Extremity Assessment: Overall WFL for tasks assessed    Lower Extremity Assessment Lower Extremity Assessment: Generalized weakness    Cervical / Trunk Assessment Cervical / Trunk Assessment: Normal  Communication   Communication: No difficulties  Cognition Arousal/Alertness:  Awake/alert Behavior During Therapy: WFL for tasks assessed/performed Overall Cognitive Status: Within Functional Limits for tasks assessed                                        General Comments      Exercises Total Joint Exercises Ankle Circles/Pumps: AROM;Both;Supine;15 reps   Assessment/Plan    PT Assessment Patient needs continued PT services  PT Problem List Decreased strength;Decreased range of motion;Decreased activity tolerance;Decreased balance;Decreased mobility;Decreased knowledge of use of DME;Decreased knowledge of precautions;Pain       PT Treatment Interventions DME instruction;Gait training;Stair training;Functional mobility training;Therapeutic activities;Therapeutic exercise;Balance training;Patient/family education    PT Goals (Current goals can be found in the Care Plan section)  Acute Rehab PT Goals Patient Stated Goal: be strong enough to get home PT Goal Formulation: With patient Time For Goal Achievement: 12/17/20 Potential to Achieve Goals: Good    Frequency 7X/week   Barriers to discharge        Co-evaluation               AM-PAC PT "6 Clicks" Mobility  Outcome Measure Help needed turning from your back to your side while in a flat bed without using bedrails?: A Little Help needed moving from lying on your back to sitting on the side of a flat bed without using bedrails?: A Little Help needed moving to and from a bed to a chair (including a wheelchair)?: A Little Help needed standing up from a chair using your arms (e.g., wheelchair or bedside chair)?: A Little Help needed to walk in hospital room?: A Little Help needed climbing 3-5 steps with a railing? : A Lot 6 Click Score: 17    End of Session Equipment Utilized During Treatment: Gait belt Activity Tolerance: Patient tolerated treatment well Patient left: with call bell/phone within reach;in bed;with bed alarm set;with family/visitor present;with SCD's  reapplied Nurse Communication: Mobility status PT Visit Diagnosis: Muscle weakness (generalized) (M62.81);Difficulty in walking, not elsewhere classified (R26.2)    Time: 9983-3825 PT Time Calculation (min) (ACUTE ONLY): 19 min   Charges:   PT Evaluation $PT Eval Low Complexity: 1 Low          Verner Mould, DPT Acute Rehabilitation Services Office [phone] Pager [phone]     Jacques Navy 12/10/2020, 6:24 PM

## 2020-12-10 NOTE — Anesthesia Procedure Notes (Signed)
Anesthesia Regional Block: Adductor canal block   Pre-Anesthetic Checklist: ,, timeout performed, Correct Patient, Correct Site, Correct Laterality, Correct Procedure, Correct Position, site marked, Risks and benefits discussed,  Surgical consent,  Pre-op evaluation,  At surgeon's request and post-op pain management  Laterality: Right  Prep: chloraprep       Needles:  Injection technique: Single-shot  Needle Type: Echogenic Needle     Needle Length: 9cm      Additional Needles:   Procedures:,,,, ultrasound used (permanent image in chart),,,,  Narrative:  Start time: 12/10/2020 8:58 AM End time: 12/10/2020 9:04 AM Injection made incrementally with aspirations every 5 mL.  Performed by: Personally  Anesthesiologist: Myrtie Soman, MD  Additional Notes: Patient tolerated the procedure well without complications

## 2020-12-10 NOTE — Progress Notes (Signed)
Assisted Dr. Kalman Shan with right, ultrasound guided, popliteal/saphenous block. Side rails up, monitors on throughout procedure. See vital signs in flow sheet. Tolerated Procedure well.

## 2020-12-10 NOTE — Op Note (Signed)
DATE OF SURGERY:  12/10/2020 TIME: 11:17 AM  PATIENT NAME:  Pamela [name]   AGE: 75 y.o.    PRE-OPERATIVE DIAGNOSIS:  OA RIGHT KNEE  POST-OPERATIVE DIAGNOSIS:  Same  PROCEDURE:  Procedure(s): TOTAL KNEE ARTHROPLASTY   SURGEON:  Renette Butters, MD   ASSISTANT:  Aggie Moats, PA-C, he was present and scrubbed throughout the case, critical for completion in a timely fashion, and for retraction, instrumentation, and closure.    OPERATIVE IMPLANTS: [employer] Triathlon CR. Press fit knee  Femur size 2, Tibia size 3, Patella size 29 3-peg oval button, with a 9 mm polyethylene insert.   PREOPERATIVE INDICATIONS:  Pamela [name] is a 75 y.o. year old female with end stage bone on bone degenerative arthritis of the knee who failed conservative treatment, including injections, antiinflammatories, activity modification, and assistive devices, and had significant impairment of their activities of daily living, and elected for Total Knee Arthroplasty.   The risks, benefits, and alternatives were discussed at length including but not limited to the risks of infection, bleeding, nerve injury, stiffness, blood clots, the need for revision surgery, cardiopulmonary complications, among others, and they were willing to proceed.   OPERATIVE DESCRIPTION:  The patient was brought to the operative room and placed in a supine position.  General anesthesia was administered.  IV antibiotics were given.  The lower extremity was prepped and draped in the usual sterile fashion.  Time out was performed.  The leg was elevated and exsanguinated and the tourniquet was inflated.  Anterior approach was performed.  The patella was everted and osteophytes were removed.  The anterior horn of the medial and lateral meniscus was removed.   The distal femur was opened with the drill and the intramedullary distal femoral cutting jig was utilized, set at 5 degrees resecting 10 mm off the distal femur.  Care  was taken to protect the collateral ligaments.  The distal femoral sizing jig was applied, taking care to avoid notching.  Then the 4-in-1 cutting jig was applied and the anterior and posterior femur was cut, along with the chamfer cuts.  All posterior osteophytes were removed.  The flexion gap was then measured and was symmetric with the extension gap.  Then the extramedullary tibial cutting jig was utilized making the appropriate cut using the anterior tibial crest as a reference building in appropriate posterior slope.  Care was taken during the cut to protect the medial and collateral ligaments.  The proximal tibia was removed along with the posterior horns of the menisci.  The PCL was sacrificed.    The extensor gap was measured and was approximately 15mm.    I completed the distal femoral preparation using the appropriate jig to prepare the box.  The patella was then measured, and cut with the saw.    The proximal tibia sized and prepared accordingly with the reamer and the punch, and then all components were trialed with the above sized poly insert.  The knee was found to have excellent balance and full motion.    The above named components were then impacted into place and Poly tibial piece and patella were inserted.  I was very happy with his stability and ROM  I performed a periarticular injection with marcaine and toradol  The knee was easily taken through a range of motion and the patella tracked well and the knee irrigated copiously and the parapatellar and subcutaneous tissue closed with vicryl, and monocryl with steri strips for the skin.  The  incision was dressed with sterile gauze and the tourniquet released and the patient was awakened and returned to the PACU in stable and satisfactory condition.  There were no complications.  Total tourniquet time was roughly 75 minutes.   POSTOPERATIVE PLAN: post op Abx, DVT px: SCD's, TED's, Early ambulation and chemical px

## 2020-12-10 NOTE — Transfer of Care (Signed)
Immediate Anesthesia Transfer of Care Note  Patient: Pamela [name]  Procedure(s) Performed: TOTAL KNEE ARTHROPLASTY (Right Knee)  Patient Location: PACU  Anesthesia Type:Spinal  Level of Consciousness: awake, alert , oriented and patient cooperative  Airway & Oxygen Therapy: Patient Spontanous Breathing and Patient connected to face mask oxygen  Post-op Assessment: Report given to RN and Post -op Vital signs reviewed and stable  Post vital signs: Reviewed and stable  Last Vitals:  Vitals Value Taken Time  BP 113/63 12/10/20 1202  Temp    Pulse 77 12/10/20 1204  Resp 20 12/10/20 1204  SpO2 100 % 12/10/20 1204  Vitals shown include unvalidated device data.  Last Pain:  Vitals:   12/10/20 0912  PainSc: 0-No pain         Complications: No complications documented.

## 2020-12-10 NOTE — Interval H&P Note (Signed)
History and Physical Interval Note:  12/10/2020 7:23 AM  Pamela [name]  has presented today for surgery, with the diagnosis of OA RIGHT KNEE.  The various methods of treatment have been discussed with the patient and family. After consideration of risks, benefits and other options for treatment, the patient has consented to  Procedure(s): TOTAL KNEE ARTHROPLASTY (Right) as a surgical intervention.  The patient's history has been reviewed, patient examined, no change in status, stable for surgery.  I have reviewed the patient's chart and labs.  Questions were answered to the patient's satisfaction.     Renette Butters

## 2020-12-10 NOTE — Anesthesia Postprocedure Evaluation (Signed)
Anesthesia Post Note  Patient: Pamela [name]  Procedure(s) Performed: TOTAL KNEE ARTHROPLASTY (Right Knee)     Patient location during evaluation: PACU Anesthesia Type: Spinal Level of consciousness: oriented and awake and alert Pain management: pain level controlled Vital Signs Assessment: post-procedure vital signs reviewed and stable Respiratory status: spontaneous breathing, respiratory function stable and patient connected to nasal cannula oxygen Cardiovascular status: blood pressure returned to baseline and stable Postop Assessment: no headache, no backache and no apparent nausea or vomiting Anesthetic complications: no   No complications documented.  Last Vitals:  Vitals:   12/10/20 1330 12/10/20 1345  BP: 124/76   Pulse: 69 63  Resp: 18 11  Temp: 36.5 C   SpO2: 98% 96%    Last Pain:  Vitals:   12/10/20 1330  PainSc: 0-No pain                 Manvi Guilliams S

## 2020-12-10 NOTE — Anesthesia Preprocedure Evaluation (Signed)
Anesthesia Evaluation  Patient identified by MRN, date of birth, ID band Patient awake    Reviewed: Allergy & Precautions, NPO status , Patient's Chart, lab work & pertinent test results  Airway Mallampati: II  TM Distance: >3 FB Neck ROM: Full    Dental no notable dental hx.    Pulmonary neg pulmonary ROS, former smoker,    Pulmonary exam normal breath sounds clear to auscultation       Cardiovascular hypertension, Normal cardiovascular exam Rhythm:Regular Rate:Normal     Neuro/Psych negative neurological ROS  negative psych ROS   GI/Hepatic Neg liver ROS, GERD  ,  Endo/Other  Hypothyroidism   Renal/GU negative Renal ROS  negative genitourinary   Musculoskeletal negative musculoskeletal ROS (+)   Abdominal   Peds negative pediatric ROS (+)  Hematology Factor II mutation Chronic coumadin   Anesthesia Other Findings   Reproductive/Obstetrics negative OB ROS                             Anesthesia Physical Anesthesia Plan  ASA: III  Anesthesia Plan: Spinal   Post-op Pain Management:  Regional for Post-op pain   Induction: Intravenous  PONV Risk Score and Plan: 2 and Ondansetron, Dexamethasone and Propofol infusion  Airway Management Planned: Simple Face Mask  Additional Equipment:   Intra-op Plan:   Post-operative Plan:   Informed Consent: I have reviewed the patients History and Physical, chart, labs and discussed the procedure including the risks, benefits and alternatives for the proposed anesthesia with the patient or authorized representative who has indicated his/her understanding and acceptance.     Dental advisory given  Plan Discussed with: CRNA and Surgeon  Anesthesia Plan Comments:         Anesthesia Quick Evaluation

## 2020-12-11 ENCOUNTER — Other Ambulatory Visit (HOSPITAL_COMMUNITY): Payer: Self-pay

## 2020-12-11 ENCOUNTER — Encounter (HOSPITAL_COMMUNITY): Payer: Self-pay | Admitting: Orthopedic Surgery

## 2020-12-11 ENCOUNTER — Encounter: Payer: Self-pay | Admitting: Oncology

## 2020-12-11 DIAGNOSIS — M1711 Unilateral primary osteoarthritis, right knee: Secondary | ICD-10-CM | POA: Diagnosis not present

## 2020-12-11 MED ORDER — ONDANSETRON HCL 4 MG PO TABS
4.0000 mg | ORAL_TABLET | Freq: Every day | ORAL | 0 refills | Status: DC | PRN
  Filled 2020-12-11: qty 10, 10d supply, fill #0

## 2020-12-11 MED ORDER — CELECOXIB 100 MG PO CAPS
100.0000 mg | ORAL_CAPSULE | Freq: Two times a day (BID) | ORAL | 0 refills | Status: AC
  Filled 2020-12-11: qty 60, 30d supply, fill #0

## 2020-12-11 MED ORDER — OXYCODONE HCL 5 MG PO TABS
5.0000 mg | ORAL_TABLET | Freq: Four times a day (QID) | ORAL | 0 refills | Status: DC | PRN
  Filled 2020-12-11: qty 28, 7d supply, fill #0

## 2020-12-11 MED ORDER — ACETAMINOPHEN 500 MG PO TABS
1000.0000 mg | ORAL_TABLET | Freq: Four times a day (QID) | ORAL | 0 refills | Status: DC | PRN
  Filled 2020-12-11: qty 60, 8d supply, fill #0

## 2020-12-11 MED ORDER — RIVAROXABAN 10 MG PO TABS
10.0000 mg | ORAL_TABLET | Freq: Every day | ORAL | 0 refills | Status: DC
  Filled 2020-12-11: qty 30, 30d supply, fill #0

## 2020-12-11 MED ORDER — METHOCARBAMOL 500 MG PO TABS
500.0000 mg | ORAL_TABLET | Freq: Three times a day (TID) | ORAL | 0 refills | Status: DC | PRN
  Filled 2020-12-11: qty 20, 7d supply, fill #0

## 2020-12-11 NOTE — Progress Notes (Signed)
Physical Therapy Treatment Patient Details Name: Pamela [name] MRN: [id number] DOB: Jan 28, 1946 Today's Date: 12/11/2020    History of Present Illness Patient is 74 y.o. female s/p Rt TKA on 12/10/20 with PMH significant for HTN, HLD, Glaucoma, hypothyroidism and muscle invasive bladder cancer in March 2021 now s/p robotic radical cystectomy, hysterectomy, left oophorectomy with creation of ileal conduit urinary diversion on 01/17/20.    PT Comments    Pt progressing slowly with PT. Limited amb distance d/t pain. Will see how pm session goes, may need another day   Follow Up Recommendations  Follow surgeon's recommendation for DC plan and follow-up therapies;Outpatient PT     Equipment Recommendations  None recommended by PT    Recommendations for Other Services       Precautions / Restrictions Precautions Precautions: Fall;Knee Restrictions Weight Bearing Restrictions: No Other Position/Activity Restrictions: WBAT    Mobility  Bed Mobility Overal bed mobility: Needs Assistance Bed Mobility: Supine to Sit     Supine to sit: Min assist;HOB elevated     General bed mobility comments: assist with RLE, trunk, incr time    Transfers Overall transfer level: Needs assistance Equipment used: Rolling walker (2 wheeled) Transfers: Sit to/from Stand Sit to Stand: Min assist;From elevated surface         General transfer comment: cues for hand placement for power up and min assist to initiate and complete rise.  Ambulation/Gait Ambulation/Gait assistance: Min assist;Mod assist Gait Distance (Feet): 5 Feet Assistive device: Rolling walker (2 wheeled) Gait Pattern/deviations: Step-to pattern;Decreased stance time - right;Decreased weight shift to right     General Gait Details: cues for sequence, use of UEs, step lenght. pt limited by fatigue and pain   Stairs             Wheelchair Mobility    Modified Rankin (Stroke Patients Only)       Balance  Overall balance assessment: Needs assistance Sitting-balance support: Feet supported Sitting balance-Leahy Scale: Fair     Standing balance support: During functional activity;Bilateral upper extremity supported Standing balance-Leahy Scale: Poor                              Cognition Arousal/Alertness: Awake/alert Behavior During Therapy: WFL for tasks assessed/performed Overall Cognitive Status: Within Functional Limits for tasks assessed                                        Exercises Total Joint Exercises Ankle Circles/Pumps: AROM;Both;Supine;15 reps Quad Sets: AROM;Both;10 reps Heel Slides: AAROM;Right;10 reps    General Comments        Pertinent Vitals/Pain Pain Assessment: 0-10 Pain Score: 7  Pain Location: R knee Pain Descriptors / Indicators: Grimacing;Sore Pain Intervention(s): Limited activity within patient's tolerance;Monitored during session;Premedicated before session;Repositioned;Ice applied    Home Living                      Prior Function            PT Goals (current goals can now be found in the care plan section) Acute Rehab PT Goals Patient Stated Goal: be strong enough to get home PT Goal Formulation: With patient Time For Goal Achievement: 12/17/20 Potential to Achieve Goals: Good Progress towards PT goals: Progressing toward goals    Frequency    7X/week  PT Plan Current plan remains appropriate    Co-evaluation              AM-PAC PT "6 Clicks" Mobility   Outcome Measure  Help needed turning from your back to your side while in a flat bed without using bedrails?: A Little Help needed moving from lying on your back to sitting on the side of a flat bed without using bedrails?: A Little Help needed moving to and from a bed to a chair (including a wheelchair)?: A Little Help needed standing up from a chair using your arms (e.g., wheelchair or bedside chair)?: A Little Help needed to  walk in hospital room?: A Little Help needed climbing 3-5 steps with a railing? : A Lot 6 Click Score: 17    End of Session Equipment Utilized During Treatment: Gait belt Activity Tolerance: Patient limited by fatigue;Patient limited by pain Patient left: with call bell/phone within reach;in chair;with chair alarm set Nurse Communication: Mobility status PT Visit Diagnosis: Muscle weakness (generalized) (M62.81);Difficulty in walking, not elsewhere classified (R26.2)     Time: 9169-4503 PT Time Calculation (min) (ACUTE ONLY): 18 min  Charges:  $Gait Training: 8-22 mins                     Baxter Flattery, PT  Acute Rehab Dept ([place]) [phone] Pager [phone]  12/11/2020    [facility] 12/11/2020, 12:53 PM

## 2020-12-11 NOTE — Discharge Instructions (Signed)
POST-OPERATIVE OPIOID TAPER INSTRUCTIONS: . It is important to wean off of your opioid medication as soon as possible. If you do not need pain medication after your surgery it is ok to stop day one. . Opioids include: o Codeine, Hydrocodone(Norco, Vicodin), Oxycodone(Percocet, oxycontin) and hydromorphone amongst others.  . Long term and even short term use of opiods can cause: o Increased pain response o Dependence o Constipation o Depression o Respiratory depression o And more.  . Withdrawal symptoms can include o Flu like symptoms o Nausea, vomiting o And more . Techniques to manage these symptoms o Hydrate well o Eat regular healthy meals o Stay active o Use relaxation techniques(deep breathing, meditating, yoga) . Do Not substitute Alcohol to help with tapering . If you have been on opioids for less than two weeks and do not have pain than it is ok to stop all together.  . Plan to wean off of opioids o This plan should start within one week post op of your joint replacement. o Maintain the same interval or time between taking each dose and first decrease the dose.  o Cut the total daily intake of opioids by one tablet each day o Next start to increase the time between doses. o The last dose that should be eliminated is the evening dose.    

## 2020-12-11 NOTE — Discharge Summary (Signed)
Physician Discharge Summary  Patient ID: Pamela [name] MRN: [id number] DOB/AGE: 1946/05/08 75 y.o.  Admit date: 12/10/2020 Discharge date: 12/12/2020  Admission Diagnoses: right knee OA  Discharge Diagnoses:  Active Problems:   S/P total knee arthroplasty, right   Discharged Condition: fair  Hospital Course: Patient underwent a Right TKA on 12/10/20 by Dr. Percell Miller without complications. She spent 2 nights in the hospital working on her mobilization. She is now ready for d/c home with HHPT.  Consults: None  Significant Diagnostic Studies: none  Treatments: IV hydration, antibiotics: Ancef, analgesia: acetaminophen, Vicodin, Dilaudid, and Morphine, anticoagulation: Xarelto, and surgery: R TKA  Discharge Exam: Blood pressure (!) 153/82, pulse 67, temperature 98.2 F (36.8 C), temperature source Oral, resp. rate 16, SpO2 95 %. General appearance: alert, cooperative, and no distress Head: Normocephalic, without obvious abnormality, atraumatic Eyes: conjunctivae/corneas clear. PERRL, EOM's intact. Fundi benign. Resp: clear to auscultation bilaterally Cardio: regular rate and rhythm, S1, S2 normal, no murmur, click, rub or gallop GI: soft, non-tender; bowel sounds normal; no masses,  no organomegaly Extremities: extremities normal, atraumatic, no cyanosis or edema Pulses:  L brachial 2+ R brachial 2+  L radial 2+ R radial 2+  L inguinal 2+ R inguinal 2+  L popliteal 2+ R popliteal 2+  L posterior tibial 2+ R posterior tibial 2+  L dorsalis pedis 2+ R dorsalis pedis 2+   Neurologic: Alert and oriented X 3, normal strength and tone. Normal symmetric reflexes. Normal coordination and gait Incision/Wound: c/d/I, covered with bandage and ACE wrap  Disposition: Discharge disposition: 01-Home or Self Care       Discharge Instructions     Call MD / Call 911   Complete by: As directed    If you experience chest pain or shortness of breath, CALL 911 and be transported to the  hospital emergency room.  If you develope a fever above 101 F, pus (white drainage) or increased drainage or redness at the wound, or calf pain, call your surgeon's office.   Diet - low sodium heart healthy   Complete by: As directed    Discharge instructions   Complete by: As directed    You may bear weight as tolerated. Keep your dressing on and dry until follow up. Take medicine to prevent blood clots as directed. Take pain medicine as needed with the goal of transitioning to over the counter medicines.    INSTRUCTIONS AFTER JOINT REPLACEMENT   Remove items at home which could result in a fall. This includes throw rugs or furniture in walking pathways ICE to the affected joint every three hours while awake for 30 minutes at a time, for at least the first 3-5 days, and then as needed for pain and swelling.  Continue to use ice for pain and swelling. You may notice swelling that will progress down to the foot and ankle.  This is normal after surgery.  Elevate your leg when you are not up walking on it.   Continue to use the breathing machine you got in the hospital (incentive spirometer) which will help keep your temperature down.  It is common for your temperature to cycle up and down following surgery, especially at night when you are not up moving around and exerting yourself.  The breathing machine keeps your lungs expanded and your temperature down.   DIET:  As you were doing prior to hospitalization, we recommend a well-balanced diet.  DRESSING / WOUND CARE / SHOWERING  You may shower 3 days after  surgery, but keep the wounds dry during showering.  You may use an occlusive plastic wrap (Press'n Seal for example) with blue painter's tape at edges, NO SOAKING/SUBMERGING IN THE BATHTUB.  If the bandage gets wet, change with a clean dry gauze.  If the incision gets wet, pat the wound dry with a clean towel.  ACTIVITY  Increase activity slowly as tolerated, but follow the weight bearing  instructions below.   No driving for 6 weeks or until further direction given by your physician.  You cannot drive while taking narcotics.  No lifting or carrying greater than 10 lbs. until further directed by your surgeon. Avoid periods of inactivity such as sitting longer than an hour when not asleep. This helps prevent blood clots.  You may return to work once you are authorized by your doctor.    WEIGHT BEARING   Weight bearing as tolerated with assist device (walker, cane, etc) as directed, use it as long as suggested by your surgeon or therapist, typically at least 4-6 weeks.   EXERCISES  Results after joint replacement surgery are often greatly improved when you follow the exercise, range of motion and muscle strengthening exercises prescribed by your doctor. Safety measures are also important to protect the joint from further injury. Any time any of these exercises cause you to have increased pain or swelling, decrease what you are doing until you are comfortable again and then slowly increase them. If you have problems or questions, call your caregiver or physical therapist for advice.   Rehabilitation is important following a joint replacement. After just a few days of immobilization, the muscles of the leg can become weakened and shrink (atrophy).  These exercises are designed to build up the tone and strength of the thigh and leg muscles and to improve motion. Often times heat used for twenty to thirty minutes before working out will loosen up your tissues and help with improving the range of motion but do not use heat for the first two weeks following surgery (sometimes heat can increase post-operative swelling).   These exercises can be done on a training (exercise) mat, on the floor, on a table or on a bed. Use whatever works the best and is most comfortable for you.    Use music or television while you are exercising so that the exercises are a pleasant break in your day. This will  make your life better with the exercises acting as a break in your routine that you can look forward to.   Perform all exercises about fifteen times, three times per day or as directed.  You should exercise both the operative leg and the other leg as well.  Exercises include:   Quad Sets - Tighten up the muscle on the front of the thigh (Quad) and hold for 5-10 seconds.   Straight Leg Raises - With your knee straight (if you were given a brace, keep it on), lift the leg to 60 degrees, hold for 3 seconds, and slowly lower the leg.  Perform this exercise against resistance later as your leg gets stronger.  Leg Slides: Lying on your back, slowly slide your foot toward your buttocks, bending your knee up off the floor (only go as far as is comfortable). Then slowly slide your foot back down until your leg is flat on the floor again.  Angel Wings: Lying on your back spread your legs to the side as far apart as you can without causing discomfort.  Hamstring Strength:  Lying on your back, push your heel against the floor with your leg straight by tightening up the muscles of your buttocks.  Repeat, but this time bend your knee to a comfortable angle, and push your heel against the floor.  You may put a pillow under the heel to make it more comfortable if necessary.   A rehabilitation program following joint replacement surgery can speed recovery and prevent re-injury in the future due to weakened muscles. Contact your doctor or a physical therapist for more information on knee rehabilitation.    CONSTIPATION  Constipation is defined medically as fewer than three stools per week and severe constipation as less than one stool per week.  Even if you have a regular bowel pattern at home, your normal regimen is likely to be disrupted due to multiple reasons following surgery.  Combination of anesthesia, postoperative narcotics, change in appetite and fluid intake all can affect your bowels.   YOU MUST use at  least one of the following options; they are listed in order of increasing strength to get the job done.  They are all available over the counter, and you may need to use some, POSSIBLY even all of these options:    Drink plenty of fluids (prune juice may be helpful) and high fiber foods Colace 100 mg by mouth twice a day  Senokot for constipation as directed and as needed Dulcolax (bisacodyl), take with full glass of water  Miralax (polyethylene glycol) once or twice a day as needed.  If you have tried all these things and are unable to have a bowel movement in the first 3-4 days after surgery call either your surgeon or your primary doctor.    If you experience loose stools or diarrhea, hold the medications until you stool forms back up.  If your symptoms do not get better within 1 week or if they get worse, check with your doctor.  If you experience "the worst abdominal pain ever" or develop nausea or vomiting, please contact the office immediately for further recommendations for treatment.   ITCHING:  If you experience itching with your medications, try taking only a single pain pill, or even half a pain pill at a time.  You can also use Benadryl over the counter for itching or also to help with sleep.   TED HOSE STOCKINGS:  Use stockings on both legs until for at least 2 weeks or as directed by physician office. They may be removed at night for sleeping.  MEDICATIONS:  See your medication summary on the "After Visit Summary" that nursing will review with you.  You may have some home medications which will be placed on hold until you complete the course of blood thinner medication.  It is important for you to complete the blood thinner medication as prescribed.  Take medicines as prescribed.   You have several different medicines that work in different ways. - Tylenol is for mild to moderate pain. Try to take this medicine before turning to your narcotic medicines.  - Celebrex is to reduce  pain / inflammation - Robaxin is for muscle spasms. This medicine can make you drowsy - Oxycodone is a narcotic pain medicine.  Take this for severe pain. This medicine can be dehydrating / constipating. - Zofran is for nausea and vomiting. - Xarelto is to prevent blood clots after surgery. YOU MUST TAKE THIS MEDICINE!  PRECAUTIONS:  If you experience chest pain or shortness of breath - call 911 immediately for transfer to  the hospital emergency department.   If you develop a fever greater that 101 F, purulent drainage from wound, increased redness or drainage from wound, foul odor from the wound/dressing, or calf pain - CONTACT YOUR SURGEON.                                                   FOLLOW-UP APPOINTMENTS:  If you do not already have a post-op appointment, please call the office [phone] for an appointment to be seen by Dr. Percell Miller in 2 weeks.   OTHER INSTRUCTIONS:   MAKE SURE YOU:  Understand these instructions.  Get help right away if you are not doing well or get worse.    Thank you for letting us be a part of your medical care team.  It is a privilege we respect greatly.  We hope these instructions will help you stay on track for a fast and full recovery!   Do not put a pillow under the knee. Place it under the heel.   Complete by: As directed    Post-operative opioid taper instructions:   Complete by: As directed    POST-OPERATIVE OPIOID TAPER INSTRUCTIONS: It is important to wean off of your opioid medication as soon as possible. If you do not need pain medication after your surgery it is ok to stop day one. Opioids include: Codeine, Hydrocodone(Norco, Vicodin), Oxycodone(Percocet, oxycontin) and hydromorphone amongst others.  Long term and even short term use of opiods can cause: Increased pain response Dependence Constipation Depression Respiratory depression And more.  Withdrawal symptoms can include Flu like symptoms Nausea, vomiting And more Techniques to  manage these symptoms Hydrate well Eat regular healthy meals Stay active Use relaxation techniques(deep breathing, meditating, yoga) Do Not substitute Alcohol to help with tapering If you have been on opioids for less than two weeks and do not have pain than it is ok to stop all together.  Plan to wean off of opioids This plan should start within one week post op of your joint replacement. Maintain the same interval or time between taking each dose and first decrease the dose.  Cut the total daily intake of opioids by one tablet each day Next start to increase the time between doses. The last dose that should be eliminated is the evening dose.      Weight bearing as tolerated   Complete by: As directed       Allergies as of 12/11/2020       Reactions   Ace Inhibitors Cough        Medication List     STOP taking these medications    Pfizer-BioNTech COVID-19 Vacc 30 MCG/0.3ML injection Generic drug: COVID-19 mRNA vaccine (Pfizer)   warfarin 4 MG tablet Commonly known as: COUMADIN       TAKE these medications    acetaminophen 500 MG tablet Commonly known as: TYLENOL Take 2 tablets (1,000 mg total) by mouth every 6 (six) hours as needed for mild pain or moderate pain. What changed:  medication strength how much to take when to take this reasons to take this   calcium carbonate 1250 (500 Ca) MG chewable tablet Commonly known as: OS-CAL Chew 1 tablet by mouth daily.   celecoxib 100 MG capsule Commonly known as: CeleBREX Take 1 capsule (100 mg total) by mouth 2 (two) times daily.  cholecalciferol 25 MCG (1000 UNIT) tablet Commonly known as: VITAMIN D3 Take 1,000 Units by mouth daily.   colestipol 1 g tablet Commonly known as: COLESTID TAKE UP TO 6 TABS AS NEEDED NO MORE THAN ONCE DAILY FOR CHRONIC BILIARY DIARRHEA What changed:  how much to take how to take this when to take this   levothyroxine 88 MCG tablet Commonly known as: SYNTHROID TAKE 1  TABLET (88 MCG TOTAL) BY MOUTH DAILY BEFORE BREAKFAST. What changed: how much to take   losartan 25 MG tablet Commonly known as: COZAAR Take 25 mg by mouth daily.   losartan 25 MG tablet Commonly known as: COZAAR TAKE 1 TABLET BY MOUTH ONCE DAILY   magnesium oxide 400 (241.3 Mg) MG tablet Commonly known as: MAG-OX TAKE 1 TABLET (400 MG TOTAL) BY MOUTH DAILY.   methocarbamol 500 MG tablet Commonly known as: Robaxin Take 1 tablet (500 mg total) by mouth every 8 (eight) hours as needed for muscle spasms.   ondansetron 4 MG tablet Commonly known as: Zofran Take 1 tablet (4 mg total) by mouth daily as needed for nausea or vomiting.   oxyCODONE 5 MG immediate release tablet Commonly known as: Roxicodone Take 1 tablet (5 mg total) by mouth every 6 (six) hours as needed for severe pain. Do not take more than 6 tablets in a 24 hour period.   pantoprazole 20 MG tablet Commonly known as: PROTONIX TAKE 1 TABLET (20 MG TOTAL) BY MOUTH DAILY. What changed: how much to take   rivaroxaban 10 MG Tabs tablet Commonly known as: XARELTO Take 1 tablet (10 mg total) by mouth daily. To prevent post-op blood clots   vitamin C 500 MG tablet Commonly known as: ASCORBIC ACID Take 500 mg by mouth daily.               Discharge Care Instructions  (From admission, onward)           Start     Ordered   12/11/20 0000  Weight bearing as tolerated        12/11/20 1403            Follow-up Information     Renette Butters, MD. Schedule an appointment as soon as possible for a visit in 2 week(s).   Specialty: Orthopedic Surgery Contact information: [address] [place] [address] [phone]                 Signed: Alisa Graff 12/11/2020, 2:04 PM

## 2020-12-11 NOTE — TOC Transition Note (Signed)
Transition of Care [facility]) - CM/SW Discharge Note  Patient Details  Name: Pamela [name] MRN: [id number] Date of Birth: 07/21/1945  Transition of Care [facility]) CM/SW Contact:  Sherie Don, LCSW Phone Number: 12/11/2020, 2:56 PM  Clinical Narrative: Patient expected to discharge after working with PT. CSW met with patient to confirm discharge plan. Patient will discharge home with HHPT through [facility]. Per patient, [employer] set up her CPM prior to surgery and she already has a rolling walker, 3N1, and shower chair at home. TOC signing off.    Final next level of care: [place] Barriers to Discharge: No Barriers Identified  Patient Goals and CMS Choice Patient states their goals for this hospitalization and ongoing recovery are:: Discharge home with [place] CMS Medicare.gov Compare Post Acute Care list provided to:: Patient Choice offered to / list presented to : Patient  Discharge Plan and Services        DME Arranged: N/A DME Agency: NA HH Arranged: PT HH Agency: [facility] Representative spoke with at [place]: Pre-arranged in orthopedist's office  Readmission Risk Interventions No flowsheet data found.

## 2020-12-11 NOTE — Progress Notes (Signed)
    Subjective: Patient reports pain as mild to moderate.  Tolerating diet.  Urinating.   No CP, SOB.  Has mobilized with PT/OT OOB. Feels good. Ready to go home  Objective:   VITALS:   Vitals:   12/10/20 1622 12/10/20 2220 12/11/20 0127 12/11/20 0512  BP: 138/86 (!) 155/80 137/75 (!) 147/78  Pulse: 66 68 73 77  Resp: 16 16 16 16   Temp: 97.9 F (36.6 C) 98 F (36.7 C) 98 F (36.7 C) 97.9 F (36.6 C)  TempSrc: Oral Oral Oral Oral  SpO2: 99% 98% 99% 98%   CBC Latest Ref Rng & Units 12/04/2020 09/06/2020 08/27/2020  WBC 4.0 - 10.5 K/uL 6.1 6.0 6.4  Hemoglobin 12.0 - 15.0 g/dL 11.7(L) 11.2(L) 10.7(L)  Hematocrit 36.0 - 46.0 % 37.0 34.9(L) 32.4(L)  Platelets 150 - 400 K/uL 243 256 219   BMP Latest Ref Rng & Units 12/04/2020 08/27/2020 02/29/2020  Glucose 70 - 99 mg/dL 96 96 96  BUN 8 - 23 mg/dL 30(H) 28(H) 17  Creatinine 0.44 - 1.00 mg/dL 1.43(H) 1.34(H) 1.52(H)  BUN/Creat Ratio 12 - 28 - - -  Sodium 135 - 145 mmol/L 138 136 138  Potassium 3.5 - 5.1 mmol/L 5.6(H) 4.7 4.3  Chloride 98 - 111 mmol/L 105 106 105  CO2 22 - 32 mmol/L 24 21(L) 26  Calcium 8.9 - 10.3 mg/dL 9.4 8.9 9.2   Intake/Output      06/07 0701 06/08 0700 06/08 0701 06/09 0700   P.O. 100    I.V. 1800    IV Piggyback 300    Total Intake 2200    Urine 1600    Blood 50    Total Output 1650    Net +550            Physical Exam: General: NAD. Sitting in bedside chair, calm Resp: No increased wob Cardio: regular rate and rhythm ABD soft Neurologically intact MSK Neurovascularly intact Sensation intact distally Intact pulses distally Dorsiflexion/Plantar flexion intact Incision: dressing C/D/I   Assessment: 1 Day Post-Op  S/P Procedure(s) (LRB): TOTAL KNEE ARTHROPLASTY (Right) by Dr. Ernesta Amble. Murphy on 12/10/20  Active Problems:   S/P total knee arthroplasty, right   Plan:  Advance diet Up with therapy Incentive Spirometry Elevate and Apply ice  Weightbearing: WBAT RLE Insicional and  dressing care: Dressings left intact until follow-up and Reinforce dressings as needed Orthopedic device(s): None Showering: Keep dressing dry VTE prophylaxis: Xarelto 10mg  daily  for 30 days post-op, SCDs, ambulation Pain control: Continue current regimen Follow - up plan: 2 weeks Contact information:  Edmonia Lynch MD, Aggie Moats PA-C  Dispo: Home with HHPT already set up    Britt Bottom, PA-C Office [phone] 12/11/2020, 8:27 AM

## 2020-12-11 NOTE — Evaluation (Addendum)
Occupational Therapy Evaluation Patient Details Name: Pamela [name] MRN: [id number] DOB: 1946-05-07 Today's Date: 12/11/2020    History of Present Illness Patient is 75 y.o. female s/p Rt TKA on 12/10/20 with PMH significant for HTN, HLD, Glaucoma, hypothyroidism and muscle invasive bladder cancer in March 2021 now s/p robotic radical cystectomy, hysterectomy, left oophorectomy with creation of ileal conduit urinary diversion on 01/17/20.   Clinical Impression   Patient lives home alone in a two story home, can stay on main level and 1 + 1 step to enter. At baseline patient is independent with self care tasks. Currently patient reports 4/10 pain at rest, 8/10 with activity in R knee limiting her overall activity tolerance, balance and safety. Patient needing min A and cues to sequence pivoting feet along floor to recliner as she was unable to lift L foot despite pushing through walker with arms. Patient instructed in use of reacher + sock aid as she was unable to doff/don R sock with min cues for technique with equipment. Recommend continued acute OT services to maximize patient safety and independence with self care in order to facilitate D/C home.    Follow Up Recommendations  Home health OT;Supervision/Assistance - 24 hour;Other (comment) (pending progress)    Equipment Recommendations  Other (comment) (long handle adaptive equipment)       Precautions / Restrictions Precautions Precautions: Fall Restrictions RLE Weight Bearing: Weight bearing as tolerated      Mobility Bed Mobility Overal bed mobility: Needs Assistance Bed Mobility: Supine to Sit     Supine to sit: Supervision;HOB elevated     General bed mobility comments: no physical assist just increased time    Transfers Overall transfer level: Needs assistance Equipment used: Rolling walker (2 wheeled) Transfers: Sit to/from Stand Sit to Stand: Min assist;From elevated surface         General transfer  comment: elevated bed height to match patient's at home, cues for hand placement and min A to stabilize. unable to lift L LE to take steps to recliner, had to pivot feet along floor    Balance Overall balance assessment: Needs assistance Sitting-balance support: Feet supported Sitting balance-Leahy Scale: Fair     Standing balance support: During functional activity;Bilateral upper extremity supported Standing balance-Leahy Scale: Poor Standing balance comment: reliant on UE support                           ADL either performed or assessed with clinical judgement   ADL Overall ADL's : Needs assistance/impaired Eating/Feeding: Independent   Grooming: Oral care;Set up;Sitting   Upper Body Bathing: Set up;Sitting   Lower Body Bathing: Moderate assistance;Sitting/lateral leans;Sit to/from stand   Upper Body Dressing : Set up;Sitting   Lower Body Dressing: Moderate assistance;Sitting/lateral leans Lower Body Dressing Details (indicate cue type and reason): patient demo ability to doff/don L sock, unable R. educate patient on use of reacher + sock aid patient needing max A to use sock aid due to pain in knee Toilet Transfer: Minimal assistance;Cueing for safety;Cueing for sequencing;RW Toilet Transfer Details (indicate cue type and reason): to recliner, patient unable to clear L foot from floor instead pivoting in feet to transfer over to recliner chair needing cues for body mechanics Toileting- Clothing Manipulation and Hygiene: Moderate assistance;Sitting/lateral lean;Sit to/from stand       Functional mobility during ADLs: Minimal assistance;Cueing for safety;Cueing for sequencing;Rolling walker General ADL Comments: patient requiring increased assistance with self care tasks due to  pain limiting her overall activity tolerance, balance, safety                  Pertinent Vitals/Pain Pain Assessment: 0-10 Pain Score: 8  Pain Location: R knee Pain Descriptors /  Indicators: Grimacing Pain Intervention(s): Premedicated before session     Hand Dominance Right   Extremity/Trunk Assessment Upper Extremity Assessment Upper Extremity Assessment: Overall WFL for tasks assessed   Lower Extremity Assessment Lower Extremity Assessment: Defer to PT evaluation       Communication Communication Communication: No difficulties   Cognition Arousal/Alertness: Awake/alert Behavior During Therapy: WFL for tasks assessed/performed Overall Cognitive Status: Within Functional Limits for tasks assessed                                                Home Living Family/patient expects to be discharged to:: Private residence Living Arrangements: Alone Available Help at Discharge: Family Type of Home: House Home Access: Stairs to enter [employer] of Steps: 1+1 at side door Entrance Stairs-Rails: None Home Layout: Two level;Able to live on main level with bedroom/bathroom;Bed/bath upstairs;1/2 bath on main level Alternate Level Stairs-Number of Steps: 16 Alternate Level Stairs-Rails: Left Bathroom Shower/Tub: Occupational psychologist: Standard Bathroom Accessibility: Yes   Home Equipment: Environmental consultant - 2 wheels;Cane - single point;Shower seat;Bedside commode;Walker - 4 wheels          Prior Functioning/Environment Level of Independence: Independent        Comments: enjoys reading        OT Problem List: Impaired balance (sitting and/or standing);Decreased activity tolerance;Decreased safety awareness;Decreased knowledge of use of DME or AE;Pain      OT Treatment/Interventions: Self-care/ADL training;DME and/or AE instruction;Therapeutic activities;Patient/family education;Balance training    OT Goals(Current goals can be found in the care plan section) Acute Rehab OT Goals Patient Stated Goal: be strong enough to get home OT Goal Formulation: With patient Time For Goal Achievement: 12/25/20 Potential to  Achieve Goals: Good  OT Frequency: Min 2X/week    AM-PAC OT "6 Clicks" Daily Activity     Outcome Measure Help from another person eating meals?: None Help from another person taking care of personal grooming?: A Little Help from another person toileting, which includes using toliet, bedpan, or urinal?: A Lot Help from another person bathing (including washing, rinsing, drying)?: A Lot Help from another person to put on and taking off regular upper body clothing?: A Little Help from another person to put on and taking off regular lower body clothing?: A Lot 6 Click Score: 16   End of Session Equipment Utilized During Treatment: Rolling walker Nurse Communication: Mobility status  Activity Tolerance: Patient tolerated treatment well;Patient limited by pain Patient left: in chair;with call bell/phone within reach  OT Visit Diagnosis: Unsteadiness on feet (R26.81);Other abnormalities of gait and mobility (R26.89);Pain Pain - Right/Left: Right Pain - part of body: Knee                Time: 1062-6948 OT Time Calculation (min): 26 min Charges:  OT General Charges $OT Visit: 1 Visit OT Evaluation $OT Eval Low Complexity: 1 Low OT Treatments $Self Care/Home Management : 8-22 mins  Delbert Phenix OT OT pager: [place] 12/11/2020, 8:59 AM

## 2020-12-11 NOTE — Progress Notes (Signed)
12/11/20 1400  PT Visit Information  Last PT Received On 12/11/20  Assistance Needed Pt progressing although slowly, still having difficulty with wt shift, antalgic gait and only  amb short distance into hallway (~25').  Pt states she has a much longer walk at home to get to her bedroom.  Pt was able to ascend one step with min assist. She may benefit from additional day of PT prior to d/c  History of Present Illness Patient is 75 y.o. female s/p Rt TKA on 12/10/20 with PMH significant for HTN, HLD, Glaucoma, hypothyroidism and muscle invasive bladder cancer in March 2021 now s/p robotic radical cystectomy, hysterectomy, left oophorectomy with creation of ileal conduit urinary diversion on 01/17/20.  Subjective Data  Patient Stated Goal be strong enough to get home  Precautions  Precautions Fall;Knee  Restrictions  Weight Bearing Restrictions No  Other Position/Activity Restrictions WBAT  Pain Assessment  Pain Assessment 0-10  Pain Score 8  Pain Location R knee  Pain Descriptors / Indicators Grimacing;Sore  Cognition  Arousal/Alertness Awake/alert  Behavior During Therapy WFL for tasks assessed/performed  Overall Cognitive Status Within Functional Limits for tasks assessed  Bed Mobility  Overal bed mobility Needs Assistance  Bed Mobility Supine to Sit  Supine to sit Min assist;HOB elevated  General bed mobility comments assist with RLE, trunk, incr time  Transfers  Overall transfer level Needs assistance  Equipment used Rolling walker (2 wheeled)  Transfers Sit to/from Stand  Sit to Stand Min assist;From elevated surface;Min guard  General transfer comment cues for hand placement for power up and min-guard assist to initiate and complete rise  Ambulation/Gait  Ambulation/Gait assistance Min assist  Gait Distance (Feet) 25 Feet  Assistive device Rolling walker (2 wheeled)  Gait Pattern/deviations Step-to pattern;Decreased stance time - right;Decreased weight shift to right   General Gait Details cues for sequence, use of UEs, step length. difficulty with wt shift to RLE, in turn difficulty advancing LLE. pt limited by fatigue and pain  Gait velocity decr  Stairs Yes  Stairs assistance Min assist  Stair Management Step to pattern;Backwards;With walker  Number of Stairs 1  General stair comments cues for sequence and safe technique, assist to balance and steady with wt shift, heavy use of UEs  Balance  Overall balance assessment Needs assistance  Sitting-balance support Feet supported  Sitting balance-Leahy Scale Fair  Standing balance support During functional activity;Bilateral upper extremity supported  Standing balance-Leahy Scale Poor  Exercises  Exercises Total Joint  PT - End of Session  Equipment Utilized During Treatment Gait belt  Activity Tolerance Patient limited by fatigue;Patient limited by pain  Patient left with call bell/phone within reach;in chair;with chair alarm set  Nurse Communication Mobility status   PT - Assessment/Plan  PT Plan Current plan remains appropriate  PT Visit Diagnosis Muscle weakness (generalized) (M62.81);Difficulty in walking, not elsewhere classified (R26.2)  PT Frequency (ACUTE ONLY) 7X/week  Follow Up Recommendations Follow surgeon's recommendation for DC plan and follow-up therapies;Outpatient PT  PT equipment None recommended by PT  AM-PAC PT "6 Clicks" Mobility Outcome Measure (Version 2)  Help needed turning from your back to your side while in a flat bed without using bedrails? 3  Help needed moving from lying on your back to sitting on the side of a flat bed without using bedrails? 3  Help needed moving to and from a bed to a chair (including a wheelchair)? 3  Help needed standing up from a chair using your arms (e.g., wheelchair  or bedside chair)? 3  Help needed to walk in hospital room? 3  Help needed climbing 3-5 steps with a railing?  2  6 Click Score 17  Consider Recommendation of Discharge To: Home  with [facility]  PT Goal Progression  Progress towards PT goals Progressing toward goals  Acute Rehab PT Goals  PT Goal Formulation With patient  Time For Goal Achievement 12/17/20  Potential to Achieve Goals Good  PT Time Calculation  PT Start Time (ACUTE ONLY) 1424  PT Stop Time (ACUTE ONLY) 1448  PT Time Calculation (min) (ACUTE ONLY) 24 min  PT General Charges  $$ ACUTE PT VISIT 1 Visit  PT Treatments  $Gait Training 23-37 mins

## 2020-12-12 ENCOUNTER — Other Ambulatory Visit (HOSPITAL_COMMUNITY): Payer: Self-pay

## 2020-12-12 DIAGNOSIS — M1711 Unilateral primary osteoarthritis, right knee: Secondary | ICD-10-CM | POA: Diagnosis not present

## 2020-12-12 NOTE — Progress Notes (Addendum)
Physical Therapy Treatment Patient Details Name: Pamela [name] MRN: [id number] DOB: Aug 16, 1945 Today's Date: 12/12/2020    History of Present Illness Patient is 75 y.o. female s/p Rt TKA on 12/10/20 with PMH significant for HTN, HLD, Glaucoma, hypothyroidism and muscle invasive bladder cancer in March 2021 now s/p robotic radical cystectomy, hysterectomy, left oophorectomy with creation of ileal conduit urinary diversion on 01/17/20.    PT Comments    Pt ambulated 15' x 2 with seated rest break, distance limited by fatigue. Pt was encouraged to attempt a longer distance but did not feel she could 2* generalized and BUE fatigue ("I have poor stamina."). Reviewed TKA HEP. Pt requires assistance for R straight leg raise and for R knee extension AROM. Pt feels she can manage at home with assistance from her daughter. Encouraged pt to ambulate hourly at home and to focus on increasing distance. She is ready to DC home from a PT standpoint.   Follow Up Recommendations  Follow surgeon's recommendation for DC plan and follow-up therapies;Outpatient PT     Equipment Recommendations  None recommended by PT    Recommendations for Other Services       Precautions / Restrictions Precautions Precautions: Fall;Knee Precaution Booklet Issued: Yes (comment) Precaution Comments: reviewed no pillow under knee Restrictions Weight Bearing Restrictions: No RLE Weight Bearing: Weight bearing as tolerated Other Position/Activity Restrictions: WBAT    Mobility  Bed Mobility Overal bed mobility: Needs Assistance Bed Mobility: Supine to Sit     Supine to sit: Min assist;HOB elevated     General bed mobility comments: assist with RLE, incr time    Transfers Overall transfer level: Needs assistance Equipment used: Rolling walker (2 wheeled) Transfers: Sit to/from Stand Sit to Stand: From elevated surface;Min guard         General transfer comment: cues for hand  placement  Ambulation/Gait Ambulation/Gait assistance: Min guard Gait Distance (Feet): 30 Feet Assistive device: Rolling walker (2 wheeled) Gait Pattern/deviations: Step-to pattern;Decreased stance time - right;Decreased weight shift to right Gait velocity: decr   General Gait Details: 15' x 2 with seated rest break,  pt limited by fatigue (she stated she has poor stamina 2* staying in during the pandemic)   Stairs             Wheelchair Mobility    Modified Rankin (Stroke Patients Only)       Balance Overall balance assessment: Needs assistance Sitting-balance support: Feet supported Sitting balance-Leahy Scale: Fair     Standing balance support: During functional activity;Bilateral upper extremity supported Standing balance-Leahy Scale: Poor                              Cognition Arousal/Alertness: Awake/alert Behavior During Therapy: WFL for tasks assessed/performed Overall Cognitive Status: Within Functional Limits for tasks assessed                                        Exercises Total Joint Exercises Ankle Circles/Pumps: AROM;Both;Supine;15 reps Quad Sets: AROM;Both;5 reps Heel Slides: AAROM;Right;5 reps;Supine Hip ABduction/ADduction: AAROM;Right;10 reps;Supine Straight Leg Raises: AAROM;Right;5 reps;Supine Long Arc Quad: AAROM;Right;5 reps;Seated Knee Flexion: AAROM;Right;10 reps;Seated Goniometric ROM: 5-45* AAROM limited by pain    General Comments        Pertinent Vitals/Pain Pain Score: 7  Pain Location: R knee Pain Descriptors / Indicators: Grimacing;Sore Pain Intervention(s): Limited activity  within patient's tolerance;Monitored during session;Premedicated before session;Ice applied    Home Living                      Prior Function            PT Goals (current goals can now be found in the care plan section) Acute Rehab PT Goals Patient Stated Goal: be strong enough to get home, walk  farther PT Goal Formulation: With patient Time For Goal Achievement: 12/17/20 Potential to Achieve Goals: Good Progress towards PT goals: Progressing toward goals    Frequency    7X/week      PT Plan Current plan remains appropriate    Co-evaluation              AM-PAC PT "6 Clicks" Mobility   Outcome Measure  Help needed turning from your back to your side while in a flat bed without using bedrails?: A Little Help needed moving from lying on your back to sitting on the side of a flat bed without using bedrails?: A Little Help needed moving to and from a bed to a chair (including a wheelchair)?: A Little Help needed standing up from a chair using your arms (e.g., wheelchair or bedside chair)?: A Little Help needed to walk in hospital room?: A Little Help needed climbing 3-5 steps with a railing? : A Lot 6 Click Score: 17    End of Session Equipment Utilized During Treatment: Gait belt Activity Tolerance: Patient limited by fatigue;Patient limited by pain Patient left: with call bell/phone within reach;in chair;with chair alarm set Nurse Communication: Mobility status PT Visit Diagnosis: Muscle weakness (generalized) (M62.81);Difficulty in walking, not elsewhere classified (R26.2)     Time: 1219-7588 PT Time Calculation (min) (ACUTE ONLY): 40 min  Charges:  $Gait Training: 8-22 mins $Therapeutic Exercise: 8-22 mins $Therapeutic Activity: 8-22 mins                     Blondell Reveal Kistler PT 12/12/2020  Acute Rehabilitation Services Pager [phone] Office [phone]

## 2020-12-12 NOTE — Progress Notes (Signed)
Minimal pain overnight. Vitals stable.

## 2020-12-12 NOTE — Progress Notes (Signed)
Pt alert and oriented. D/C intsructions given, pt d/cd to home.

## 2020-12-12 NOTE — Progress Notes (Signed)
    Subjective: After seeing patient yesterday, she tried working with PT on mobilization and didn't feel comfortable to discharge home after all. She felt another night in the hospital would be a good idea. Today patient reports pain as mild. Well controlled with medicine. Tolerating diet. Urinating. No CP, SOB.  Continuing to work on Transport planner.   Objective:   VITALS:   Vitals:   12/11/20 0935 12/11/20 1259 12/11/20 2204 12/12/20 0515  BP: (!) 147/74 (!) 153/82 133/72 133/66  Pulse: 75 67 72 83  Resp: 16 16 16 16   Temp: 98.6 F (37 C) 98.2 F (36.8 C) 98.4 F (36.9 C) 99 F (37.2 C)  TempSrc: Oral Oral Oral Oral  SpO2: 97% 95% 97% 95%   CBC Latest Ref Rng & Units 12/04/2020 09/06/2020 08/27/2020  WBC 4.0 - 10.5 K/uL 6.1 6.0 6.4  Hemoglobin 12.0 - 15.0 g/dL 11.7(L) 11.2(L) 10.7(L)  Hematocrit 36.0 - 46.0 % 37.0 34.9(L) 32.4(L)  Platelets 150 - 400 K/uL 243 256 219   BMP Latest Ref Rng & Units 12/04/2020 08/27/2020 02/29/2020  Glucose 70 - 99 mg/dL 96 96 96  BUN 8 - 23 mg/dL 30(H) 28(H) 17  Creatinine 0.44 - 1.00 mg/dL 1.43(H) 1.34(H) 1.52(H)  BUN/Creat Ratio 12 - 28 - - -  Sodium 135 - 145 mmol/L 138 136 138  Potassium 3.5 - 5.1 mmol/L 5.6(H) 4.7 4.3  Chloride 98 - 111 mmol/L 105 106 105  CO2 22 - 32 mmol/L 24 21(L) 26  Calcium 8.9 - 10.3 mg/dL 9.4 8.9 9.2   Intake/Output      06/08 0701 06/09 0700 06/09 0701 06/10 0700   P.O. 320    I.V.     IV Piggyback     Total Intake 320    Urine 2950    Blood     Total Output 2950    Net -2630            Physical Exam: General: NAD.  Laying in bed. Calm, comfortable Resp: No increased wob Cardio: regular rate and rhythm ABD soft Neurologically intact MSK Neurovascularly intact Sensation intact distally Intact pulses distally Dorsiflexion/Plantar flexion intact Incision: dressing C/D/I, KI in place   Assessment: 2 Days Post-Op  S/P Procedure(s) (LRB): TOTAL KNEE ARTHROPLASTY (Right) by Dr. Ernesta Amble. Murphy  on 12/10/20  Active Problems:   S/P total knee arthroplasty, right    Plan: Will work with PT once more and hopefully the sessions goes better Advance diet Up with therapy Incentive Spirometry Elevate and Apply ice  Weightbearing: WBAT RLE Insicional and dressing care: Dressings left intact until follow-up and Reinforce dressings as needed Orthopedic device(s):  can d/c the KI Showering: POD 3, Keep dressing dry VTE prophylaxis:  Xarelto 10mg  daily sent to pharmacy  , SCDs, ambulation Pain control: Sent meds to pharmacy yesterday Follow - up plan: 2 weeks Contact information:  Edmonia Lynch MD, Aggie Moats PA-C  Dispo: Home with HHPT already set up    Britt Bottom, PA-C Office [phone] 12/12/2020, 7:21 AM

## 2020-12-13 ENCOUNTER — Other Ambulatory Visit (HOSPITAL_COMMUNITY): Payer: Self-pay

## 2020-12-23 ENCOUNTER — Ambulatory Visit: Payer: Medicare Other

## 2020-12-26 ENCOUNTER — Other Ambulatory Visit (HOSPITAL_COMMUNITY): Payer: Self-pay

## 2020-12-26 MED ORDER — HYDROCODONE-ACETAMINOPHEN 5-325 MG PO TABS
ORAL_TABLET | ORAL | 0 refills | Status: DC
  Filled 2020-12-26: qty 30, 4d supply, fill #0

## 2021-01-07 ENCOUNTER — Encounter: Payer: Self-pay | Admitting: *Deleted

## 2021-01-09 ENCOUNTER — Telehealth: Payer: Self-pay | Admitting: Pharmacist

## 2021-01-09 ENCOUNTER — Other Ambulatory Visit (HOSPITAL_COMMUNITY): Payer: Self-pay

## 2021-01-09 MED ORDER — RIVAROXABAN 10 MG PO TABS
10.0000 mg | ORAL_TABLET | Freq: Every day | ORAL | 0 refills | Status: DC
  Filled 2021-01-09: qty 90, 90d supply, fill #0

## 2021-01-09 NOTE — Telephone Encounter (Signed)
Patient is a 74 year old woman having been seen in the [facility] when she was on warfarin--prior to her R TKA performed by Dr. Tim Murphy. Dr. Murphy, Dr. 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 She has had no subsequent events while on oral anticoagulation with warfarin which required monthly monitoring to maintain her therapeutic time in range, required dietary considerations as well as a myriad of drug-drug interactions, many of which are NOT present upon the DOCA, rivaroxaban. There is no dietary restrictions or laboratory monitoring on rivaroxaban/Xarelto. After discussion with the patient, she elects to continue upon rivaroxaban/Xarelto at 10mg once-daily, by mouth. Discussed with Dr. Williams who does not have any objections to the patient's decision to make this change. Patient was advised that she would (as was required on warfarin) call myself, her Physician/the [facility] or report to an Emergency Room if she were to develop or observe shortness of breath, increased pulse-rate, coughing up blood or any of the signs of PE---as well as pain in her lower extremities, etc. Was advised to similarly alert us or go to the ED if she were to observe blood in her urine, stool, nose-bleeds, coughing up blood, increased bruising,etc. She verbalized her understanding of these signs and symptoms and their clinical significance. A new prescription from the [facility] will be sent electronically to the patient's pharmacy. She was instructed to DISCONTINUE warfarin, I.e. NOT TO RESUME warfarin. Prescription for warfarin will be discontinued.  

## 2021-01-10 ENCOUNTER — Other Ambulatory Visit (HOSPITAL_COMMUNITY): Payer: Self-pay

## 2021-01-23 ENCOUNTER — Other Ambulatory Visit: Payer: Self-pay

## 2021-01-23 ENCOUNTER — Ambulatory Visit (HOSPITAL_COMMUNITY)
Admission: RE | Admit: 2021-01-23 | Discharge: 2021-01-23 | Disposition: A | Discharge status: Home / Self Care | Payer: Medicare Other | Source: Ambulatory Visit | Attending: Urology | Admitting: Urology

## 2021-01-23 ENCOUNTER — Other Ambulatory Visit (HOSPITAL_COMMUNITY): Payer: Self-pay | Admitting: Urology

## 2021-01-23 DIAGNOSIS — C674 Malignant neoplasm of posterior wall of bladder: Secondary | ICD-10-CM | POA: Diagnosis not present

## 2021-01-23 MED FILL — Levothyroxine Sodium Tab 88 MCG: ORAL | 90 days supply | Qty: 90 | Fill #1 | Status: CN

## 2021-01-24 ENCOUNTER — Other Ambulatory Visit (HOSPITAL_COMMUNITY): Payer: Self-pay

## 2021-01-24 MED FILL — Levothyroxine Sodium Tab 88 MCG: ORAL | 90 days supply | Qty: 90 | Fill #0 | Status: AC

## 2021-02-04 MED FILL — Pantoprazole Sodium EC Tab 20 MG (Base Equiv): ORAL | 90 days supply | Qty: 90 | Fill #1 | Status: CN

## 2021-02-05 ENCOUNTER — Other Ambulatory Visit (HOSPITAL_COMMUNITY): Payer: Self-pay

## 2021-02-05 MED FILL — Pantoprazole Sodium EC Tab 20 MG (Base Equiv): ORAL | 90 days supply | Qty: 90 | Fill #0 | Status: AC

## 2021-02-12 NOTE — Telephone Encounter (Signed)
Ms. Saint interesting case was discussed at length with Dr. Elie Confer, who nicely summarizes the clinical course and decision-making with regard to her coagulopathy management.  I agree with his documentation of our shared assessment and plan.

## 2021-02-25 ENCOUNTER — Inpatient Hospital Stay: Payer: Medicare Other | Admitting: Oncology

## 2021-02-25 ENCOUNTER — Other Ambulatory Visit: Payer: Self-pay

## 2021-02-25 ENCOUNTER — Other Ambulatory Visit (HOSPITAL_COMMUNITY): Payer: Self-pay

## 2021-02-25 ENCOUNTER — Inpatient Hospital Stay: Payer: Medicare Other | Attending: Oncology

## 2021-02-25 VITALS — BP 143/84 | HR 87 | Temp 97.7°F | Resp 18 | Wt 170.7 lb

## 2021-02-25 DIAGNOSIS — C689 Malignant neoplasm of urinary organ, unspecified: Secondary | ICD-10-CM

## 2021-02-25 DIAGNOSIS — Z86718 Personal history of other venous thrombosis and embolism: Secondary | ICD-10-CM | POA: Insufficient documentation

## 2021-02-25 DIAGNOSIS — D649 Anemia, unspecified: Secondary | ICD-10-CM | POA: Insufficient documentation

## 2021-02-25 DIAGNOSIS — C679 Malignant neoplasm of bladder, unspecified: Secondary | ICD-10-CM

## 2021-02-25 DIAGNOSIS — Z8551 Personal history of malignant neoplasm of bladder: Secondary | ICD-10-CM | POA: Diagnosis not present

## 2021-02-25 DIAGNOSIS — Z95828 Presence of other vascular implants and grafts: Secondary | ICD-10-CM

## 2021-02-25 DIAGNOSIS — Z96661 Presence of right artificial ankle joint: Secondary | ICD-10-CM | POA: Insufficient documentation

## 2021-02-25 DIAGNOSIS — Z7901 Long term (current) use of anticoagulants: Secondary | ICD-10-CM | POA: Insufficient documentation

## 2021-02-25 DIAGNOSIS — Z906 Acquired absence of other parts of urinary tract: Secondary | ICD-10-CM | POA: Diagnosis not present

## 2021-02-25 LAB — CBC WITH DIFFERENTIAL (CANCER CENTER ONLY)
Abs Immature Granulocytes: 0.04 10*3/uL (ref 0.00–0.07)
Basophils Absolute: 0.1 10*3/uL (ref 0.0–0.1)
Basophils Relative: 1 %
Eosinophils Absolute: 0.1 10*3/uL (ref 0.0–0.5)
Eosinophils Relative: 1 %
HCT: 34.1 % — ABNORMAL LOW (ref 36.0–46.0)
Hemoglobin: 11.3 g/dL — ABNORMAL LOW (ref 12.0–15.0)
Immature Granulocytes: 0 %
Lymphocytes Relative: 12 %
Lymphs Abs: 1.1 10*3/uL (ref 0.7–4.0)
MCH: 33.6 pg (ref 26.0–34.0)
MCHC: 33.1 g/dL (ref 30.0–36.0)
MCV: 101.5 fL — ABNORMAL HIGH (ref 80.0–100.0)
Monocytes Absolute: 0.7 10*3/uL (ref 0.1–1.0)
Monocytes Relative: 7 %
Neutro Abs: 7.3 10*3/uL (ref 1.7–7.7)
Neutrophils Relative %: 79 %
Platelet Count: 271 10*3/uL (ref 150–400)
RBC: 3.36 MIL/uL — ABNORMAL LOW (ref 3.87–5.11)
RDW: 13 % (ref 11.5–15.5)
WBC Count: 9.2 10*3/uL (ref 4.0–10.5)
nRBC: 0 % (ref 0.0–0.2)

## 2021-02-25 LAB — CMP (CANCER CENTER ONLY)
ALT: 9 U/L (ref 0–44)
AST: 16 U/L (ref 15–41)
Albumin: 3.8 g/dL (ref 3.5–5.0)
Alkaline Phosphatase: 72 U/L (ref 38–126)
Anion gap: 11 (ref 5–15)
BUN: 21 mg/dL (ref 8–23)
CO2: 23 mmol/L (ref 22–32)
Calcium: 9.9 mg/dL (ref 8.9–10.3)
Chloride: 102 mmol/L (ref 98–111)
Creatinine: 1.3 mg/dL — ABNORMAL HIGH (ref 0.44–1.00)
GFR, Estimated: 42 mL/min — ABNORMAL LOW (ref >60)
Glucose, Bld: 110 mg/dL — ABNORMAL HIGH (ref 70–99)
Potassium: 4.7 mmol/L (ref 3.5–5.1)
Sodium: 136 mmol/L (ref 135–145)
Total Bilirubin: 1.1 mg/dL (ref 0.3–1.2)
Total Protein: 7.4 g/dL (ref 6.5–8.1)

## 2021-02-25 NOTE — Progress Notes (Signed)
Hematology and Oncology Follow Up Visit  Pamela [name] [id number] 1946/02/14 75 y.o. 02/25/2021 10:06 AM Pamela [name], MDWilliams, Pamela Pattee, MD   Principle Diagnosis: 75 year old with bladder cancer diagnosed in February 2021.  She achieved a complete response after radical cystectomy after T2N0 disease.   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  Neoadjuvant chemotherapy with gemcitabine and cisplatin started on March 24th 2021.   She completed 3 cycles of therapy on Nov 14, 2019.  She is status post robotic assisted laparoscopic cystectomy and lymphadenectomy completed on January 17, 2020.  She had a complete response to therapy with no residual disease on surgery.  Current therapy: Active surveillance.  Interim History: [name] presents is here for a follow-up visit.  Since the last visit, she underwent right knee replacement surgery without any complications.  She has resumed most activities of daily living although her stamina remains slightly low.  She denies any shortness of breath or difficulty breathing.  She denies any hematuria or dysuria.  She denies any flank pain or discomfort.    Medications: Unchanged on review. Current Outpatient Medications  Medication Sig Dispense Refill   acetaminophen (TYLENOL) 500 MG tablet Take 2 tablets (1,000 mg total) by mouth every 6 (six) hours as needed for mild pain or moderate pain. 60 tablet 0   calcium carbonate (OS-CAL) 1250 (500 Ca) MG chewable tablet Chew 1 tablet by mouth daily.     cholecalciferol (VITAMIN D3) 25 MCG (1000 UNIT) tablet Take 1,000 Units by mouth daily.     colestipol (COLESTID) 1 g tablet TAKE UP TO 6 TABS AS NEEDED NO MORE THAN ONCE DAILY FOR CHRONIC BILIARY DIARRHEA (Patient taking differently: Take 2 g by mouth daily.) 180 tablet 3   HYDROcodone-acetaminophen (NORCO/VICODIN) 5-325 MG tablet Take 1-2 tablet by mouth  every 6 to 8 hours as needed for pain (Patient not taking: Reported on 01/09/2021) 30 tablet 0   levothyroxine (SYNTHROID) 88 MCG tablet TAKE 1 TABLET (88 MCG TOTAL) BY MOUTH DAILY BEFORE BREAKFAST. (Patient taking differently: Take 88 mcg by mouth daily before breakfast.) 90 tablet 3   losartan (COZAAR) 25 MG tablet Take 25 mg by mouth daily.     losartan (COZAAR) 25 MG tablet TAKE 1 TABLET BY MOUTH ONCE DAILY 90 tablet 3   magnesium oxide (MAG-OX) 400 (241.3 Mg) MG tablet TAKE 1 TABLET (400 MG TOTAL) BY MOUTH DAILY. (Patient not taking: Reported on 01/09/2021) 14 tablet 2   methocarbamol (ROBAXIN) 500 MG tablet Take 1 tablet (500 mg total) by mouth every 8 (eight) hours as needed for muscle spasms. (Patient not taking: Reported on 01/09/2021) 20 tablet 0   ondansetron (ZOFRAN) 4 MG tablet Take 1 tablet (4 mg total) by mouth daily as needed for nausea or vomiting. 10 tablet 0   oxyCODONE (ROXICODONE) 5 MG immediate release tablet Take 1 tablet (5 mg total) by mouth every 6 (six) hours as needed for severe pain. Do not take more than 6 tablets in a 24 hour period. (Patient not taking: Reported on 01/09/2021) 28 tablet 0   pantoprazole (PROTONIX) 20 MG tablet TAKE 1 TABLET (20 MG TOTAL) BY MOUTH DAILY. (Patient taking differently: Take 20 mg by mouth daily.) 90 tablet 3   rivaroxaban (XARELTO) 10 MG TABS tablet Take 1 tablet (10 mg total) by mouth daily. 90 tablet 0   vitamin C (ASCORBIC ACID) 500 MG tablet Take 500  mg by mouth daily.     No current facility-administered medications for this visit.     Allergies:  Allergies  Allergen Reactions   Ace Inhibitors Cough      Physical Exam:   Blood pressure (!) 143/84, pulse 87, temperature 97.7 F (36.5 C), temperature source Oral, resp. rate 18, weight 170 lb 11.2 oz (77.4 kg), SpO2 98 %.     ECOG: 1   General appearance: Comfortable appearing without any discomfort Head: Normocephalic without any trauma Oropharynx: Mucous membranes are  moist and pink without any thrush or ulcers. Eyes: Pupils are equal and round reactive to light. Lymph nodes: No cervical, supraclavicular, inguinal or axillary lymphadenopathy.   Heart:regular rate and rhythm.  S1 and S2 without leg edema. Lung: Clear without any rhonchi or wheezes.  No dullness to percussion. Abdomin: Soft, nontender, nondistended with good bowel sounds.  No hepatosplenomegaly. Musculoskeletal: No joint deformity or effusion.  Full range of motion noted. Neurological: No deficits noted on motor, sensory and deep tendon reflex exam. Dermatological exam: No skin rash or lesions.       Lab Results: Lab Results  Component Value Date   WBC 6.1 12/04/2020   HGB 11.7 (L) 12/04/2020   HCT 37.0 12/04/2020   MCV 101.9 (H) 12/04/2020   PLT 243 12/04/2020     Chemistry      Component Value Date/Time   NA 138 12/04/2020 1022   NA 143 06/13/2019 1520   K 5.6 (H) 12/04/2020 1022   CL 105 12/04/2020 1022   CO2 24 12/04/2020 1022   BUN 30 (H) 12/04/2020 1022   BUN 15 06/13/2019 1520   CREATININE 1.43 (H) 12/04/2020 1022   CREATININE 1.34 (H) 08/27/2020 0921   CREATININE 0.78 12/13/2014 0954      Component Value Date/Time   CALCIUM 9.4 12/04/2020 1022   ALKPHOS 52 08/27/2020 0921   AST 17 08/27/2020 0921   ALT 13 08/27/2020 0921   BILITOT 0.7 08/27/2020 0921          Impression and Plan:   75 year old woman with:   1.    Bladder cancer diagnosed in July 2021.  She presented with T2N0 disease and had a complete response to neoadjuvant chemotherapy after cystectomy.   She is currently on active surveillance after achieving a complete response to therapy utilizing neoadjuvant treatments followed by radical cystectomy.  CT scan and chest x-ray obtained on January 23, 2021 were personally reviewed and showed no evidence of metastatic disease.  The natural course of this disease and the risk of relapse was assessed and given her complete response prior to cystectomy  anticipated low risk of relapse at this time.    2.  IV access: Port-A-Cath has been removed without any issues.    3.  Renal function surveillance: Creatinine clearance remained stable at this time without any further decline.     4.  History of recurrent thrombosis: She is chronically anticoagulated with Xarelto without any recurrent bleeding or thrombosis episode.   5.  Anemia: Hemoglobin close to normal range at this time.  Her anemia is related to malignancy, chemotherapy and recent orthopedic surgery.   6.  Follow-up: in 6 months for repeat follow-up.   30  minutes were spent on this encounter.  The time was dedicated to reviewing her disease status, treatment choices and outlining future plan of care discussion.        Zola Button, MD 8/23/202210:06 AM

## 2021-03-20 ENCOUNTER — Ambulatory Visit (INDEPENDENT_AMBULATORY_CARE_PROVIDER_SITE_OTHER): Payer: Medicare Other | Admitting: Internal Medicine

## 2021-03-20 ENCOUNTER — Encounter: Payer: Self-pay | Admitting: Internal Medicine

## 2021-03-20 ENCOUNTER — Ambulatory Visit: Payer: Medicare Other

## 2021-03-20 VITALS — BP 112/60 | HR 78 | Temp 98.6°F | Ht 64.0 in | Wt 171.8 lb

## 2021-03-20 DIAGNOSIS — K219 Gastro-esophageal reflux disease without esophagitis: Secondary | ICD-10-CM

## 2021-03-20 DIAGNOSIS — K9089 Other intestinal malabsorption: Secondary | ICD-10-CM

## 2021-03-20 DIAGNOSIS — Z23 Encounter for immunization: Secondary | ICD-10-CM

## 2021-03-20 DIAGNOSIS — E663 Overweight: Secondary | ICD-10-CM | POA: Insufficient documentation

## 2021-03-20 DIAGNOSIS — E039 Hypothyroidism, unspecified: Secondary | ICD-10-CM

## 2021-03-20 DIAGNOSIS — I10 Essential (primary) hypertension: Secondary | ICD-10-CM

## 2021-03-20 DIAGNOSIS — D6869 Other thrombophilia: Secondary | ICD-10-CM

## 2021-03-20 DIAGNOSIS — F5104 Psychophysiologic insomnia: Secondary | ICD-10-CM

## 2021-03-20 DIAGNOSIS — M810 Age-related osteoporosis without current pathological fracture: Secondary | ICD-10-CM

## 2021-03-20 DIAGNOSIS — C689 Malignant neoplasm of urinary organ, unspecified: Secondary | ICD-10-CM

## 2021-03-20 DIAGNOSIS — D682 Hereditary deficiency of other clotting factors: Secondary | ICD-10-CM

## 2021-03-20 NOTE — Assessment & Plan Note (Addendum)
No sxs with the pantoprazole (generic omeprazole wasn't helpful).

## 2021-03-20 NOTE — Assessment & Plan Note (Signed)
TSH done by Dr. Layne Benton at ortho.  I can't see these results yet.

## 2021-03-20 NOTE — Assessment & Plan Note (Addendum)
Improved since cancer surgery, which is probably coincidental, though about a foot of bowel was resected for the urostomy surgery which may have altered (for the better) function.  Now taking colestid only 2 pills daily, and sometimes not at all.

## 2021-03-20 NOTE — Patient Instructions (Signed)
Ms. Pamela, [name] to see you looking and feeling so well!  Lots of water under the bridge in the past two years.  Your BP and weight are looking very good and we made no changes today.  Please keep me up to speed on the osteoporosis plan, and I'll see you again for a recheck in 6-12 months. Take care and stay well, Dr. Jimmye Norman

## 2021-03-20 NOTE — Progress Notes (Signed)
[name] is here for routine f/u.  She is doing very very well, having had a successful recovery from R TKR, and a recent f/u visit with her oncologist indicating ongoing remission.  She has had her portacath removed and feels that her greatest challenge now will be recovering her lost strength, conditioning, and energy from losses due to illness over the previous 2 years.  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  She has lost her high notes.  Incidental finding of spinal compression fracture found on urologic CT surveillance, referred to Dr. Layne Benton in ortho by urologist Dr. Tammi Klippel.  Many tests done, DEXA pending for 04/28/21 (has to wait for authorization since one was done 2 yrs ago) - Prolia recommended, Vit D/Ca and vit K2 recommended for bone health.  Has f/u with Dr. Jacinto Reap. 05/08/21.  TSH  already done by Dr. Layne Benton as well.  I cannot view their practice's documentation in our EMR.  BP 112/60 (BP Location: Right Arm, Patient Position: Sitting, Cuff Size: Small)   Pulse 78   Temp 98.6 F (37 C) (Oral)   Ht '5\' 4"'$  (1.626 m)   Wt 171 lb 12.8 oz (77.9 kg)   SpO2 100%   BMI 29.49 kg/m   Appearance is younger than stated age.  Gait is brisk and balanced, no varus or valgus deformity of knees, no limp, no swelling.  Scar is well healed.    Assessment and plan:  Generally she is doing very well!  Unfortunate finding of compression fracture signaling clinical osteoporosis, which is being addressed elsewhere, resulting in fragmented care. No documentation visible.   See problem list.  No RF needed Desires flu shot Desires 2nd Covid booster which was missed due to recent TKR.  Will wait for Omicron booster.

## 2021-03-21 NOTE — Assessment & Plan Note (Signed)
Well controlled on current regimen. Continue.  

## 2021-03-21 NOTE — Assessment & Plan Note (Signed)
Slow desired weight loss, no longer in obesity range.  SHe looks forward to walking for exercise now that her knee has been replaced and the weather is cooling.

## 2021-03-21 NOTE — Assessment & Plan Note (Signed)
Resumed OTC melatonin which has been helpful.  SHe is satisfied at this time.

## 2021-03-21 NOTE — Assessment & Plan Note (Signed)
Remains in remission per recent urology visit, portacath removed, doing well.

## 2021-03-21 NOTE — Assessment & Plan Note (Signed)
During perioperative period for recent TKR, discussions held with patient and with Dr. Elie Confer.  Decision made to transition to [place].  She is doing well with xarelto, though cost is more.  SHe is doing ok with about 30$/mo. Monitor.

## 2021-03-21 NOTE — Assessment & Plan Note (Signed)
See factor 2 mutation problem

## 2021-03-21 NOTE — Assessment & Plan Note (Signed)
Incidental finding of spinal compression fracture on recent CT performed for cancer surveillance.  Urology referred her to Dr. Layne Benton (FP with ortho group) who has begun evaluation.  DEXA scheduled for 05/2021, when insurance permits.  Vit D level completed and vitamins prescribed, no records visible in Epic.

## 2021-03-27 ENCOUNTER — Ambulatory Visit: Payer: Medicare Other | Attending: Internal Medicine

## 2021-03-27 ENCOUNTER — Other Ambulatory Visit (HOSPITAL_BASED_OUTPATIENT_CLINIC_OR_DEPARTMENT_OTHER): Payer: Self-pay

## 2021-03-27 DIAGNOSIS — Z23 Encounter for immunization: Secondary | ICD-10-CM

## 2021-03-27 MED ORDER — PFIZER COVID-19 VAC BIVALENT 30 MCG/0.3ML IM SUSP
INTRAMUSCULAR | 0 refills | Status: DC
  Filled 2021-03-27: qty 0.3, 1d supply, fill #0

## 2021-03-27 NOTE — Progress Notes (Signed)
   Covid-19 Vaccination Clinic  Name:  Pamela [name]    MRN: [id number] DOB: [birth year]  03/27/2021  [name] was observed post Covid-19 immunization for 15 minutes without incident. She was provided with Vaccine Information Sheet and instruction to access the V-Safe system.   [name] was instructed to call 911 with any severe reactions post vaccine: Difficulty breathing  Swelling of face and throat  A fast heartbeat  A bad rash all over body  Dizziness and weakness

## 2021-04-05 ENCOUNTER — Other Ambulatory Visit: Payer: Self-pay | Admitting: Pharmacist

## 2021-04-07 ENCOUNTER — Other Ambulatory Visit (HOSPITAL_COMMUNITY): Payer: Self-pay

## 2021-04-09 ENCOUNTER — Other Ambulatory Visit (HOSPITAL_COMMUNITY): Payer: Self-pay

## 2021-04-09 ENCOUNTER — Other Ambulatory Visit: Payer: Self-pay | Admitting: Internal Medicine

## 2021-04-09 MED ORDER — AMOXICILLIN 500 MG PO CAPS
2000.0000 mg | ORAL_CAPSULE | ORAL | 1 refills | Status: DC
  Filled 2021-04-09: qty 4, 1d supply, fill #0
  Filled 2021-04-23: qty 4, 1d supply, fill #1

## 2021-04-09 MED ORDER — RIVAROXABAN 10 MG PO TABS
10.0000 mg | ORAL_TABLET | Freq: Every day | ORAL | 3 refills | Status: DC
  Filled 2021-04-09: qty 90, 90d supply, fill #0
  Filled 2021-07-05 – 2021-07-10 (×2): qty 90, 90d supply, fill #1
  Filled 2021-09-23 – 2021-09-30 (×2): qty 90, 90d supply, fill #2

## 2021-04-10 ENCOUNTER — Other Ambulatory Visit (HOSPITAL_COMMUNITY): Payer: Self-pay

## 2021-04-10 ENCOUNTER — Telehealth: Payer: Self-pay

## 2021-04-10 NOTE — Telephone Encounter (Signed)
I called [facility] who stated Xarelto refill will be ready today and cost is $111.00 for 90 days.  Called pt / informed of refill. She also stated she's aware of cost.

## 2021-04-10 NOTE — Telephone Encounter (Signed)
rivaroxaban (XARELTO) 10 MG TABS tablet, refill request @ outpatient pharmacy. Per patient the pharmacy is waiting for the clinic to reply back.

## 2021-04-23 ENCOUNTER — Other Ambulatory Visit (HOSPITAL_COMMUNITY): Payer: Self-pay

## 2021-04-23 ENCOUNTER — Other Ambulatory Visit: Payer: Self-pay

## 2021-04-23 DIAGNOSIS — E039 Hypothyroidism, unspecified: Secondary | ICD-10-CM

## 2021-04-23 MED ORDER — LEVOTHYROXINE SODIUM 88 MCG PO TABS
88.0000 ug | ORAL_TABLET | Freq: Every day | ORAL | 3 refills | Status: DC
  Filled 2021-04-23: qty 90, 90d supply, fill #0
  Filled 2021-07-22: qty 90, 90d supply, fill #1
  Filled 2021-10-19: qty 90, 90d supply, fill #2
  Filled 2022-01-15: qty 90, 90d supply, fill #3

## 2021-04-24 ENCOUNTER — Ambulatory Visit: Payer: Medicare Other

## 2021-04-28 LAB — HM DEXA SCAN

## 2021-05-07 ENCOUNTER — Other Ambulatory Visit (HOSPITAL_COMMUNITY): Payer: Self-pay

## 2021-05-07 ENCOUNTER — Other Ambulatory Visit: Payer: Self-pay

## 2021-05-07 MED ORDER — PANTOPRAZOLE SODIUM 20 MG PO TBEC
20.0000 mg | DELAYED_RELEASE_TABLET | Freq: Every day | ORAL | 3 refills | Status: DC
  Filled 2021-05-07: qty 90, 90d supply, fill #0
  Filled 2021-08-11: qty 90, 90d supply, fill #1
  Filled 2021-11-02: qty 90, 90d supply, fill #2
  Filled 2022-02-02: qty 90, 90d supply, fill #3

## 2021-05-08 ENCOUNTER — Other Ambulatory Visit (HOSPITAL_COMMUNITY): Payer: Self-pay

## 2021-05-08 MED ORDER — IBANDRONATE SODIUM 150 MG PO TABS
150.0000 mg | ORAL_TABLET | ORAL | 6 refills | Status: DC
  Filled 2021-05-08: qty 3, 84d supply, fill #0
  Filled 2021-07-22: qty 3, 84d supply, fill #1

## 2021-05-13 ENCOUNTER — Other Ambulatory Visit: Payer: Self-pay

## 2021-05-15 ENCOUNTER — Other Ambulatory Visit (HOSPITAL_COMMUNITY): Payer: Self-pay

## 2021-05-15 ENCOUNTER — Other Ambulatory Visit: Payer: Self-pay | Admitting: Internal Medicine

## 2021-05-15 DIAGNOSIS — K9089 Other intestinal malabsorption: Secondary | ICD-10-CM

## 2021-05-15 MED ORDER — COLESTIPOL HCL 1 G PO TABS
6.0000 g | ORAL_TABLET | Freq: Every day | ORAL | 3 refills | Status: DC | PRN
  Filled 2021-05-15: qty 180, 30d supply, fill #0
  Filled 2021-07-28: qty 180, 30d supply, fill #1

## 2021-05-27 ENCOUNTER — Other Ambulatory Visit (HOSPITAL_COMMUNITY): Payer: Self-pay

## 2021-07-07 ENCOUNTER — Other Ambulatory Visit (HOSPITAL_COMMUNITY): Payer: Self-pay

## 2021-07-10 ENCOUNTER — Encounter: Payer: Self-pay | Admitting: Oncology

## 2021-07-10 ENCOUNTER — Other Ambulatory Visit (HOSPITAL_COMMUNITY): Payer: Self-pay

## 2021-07-22 ENCOUNTER — Encounter: Payer: Self-pay | Admitting: Oncology

## 2021-07-22 ENCOUNTER — Other Ambulatory Visit (HOSPITAL_COMMUNITY): Payer: Self-pay

## 2021-07-22 MED ORDER — ZOSTER VAC RECOMB ADJUVANTED 50 MCG/0.5ML IM SUSR
0.5000 mL | INTRAMUSCULAR | 1 refills | Status: DC
  Filled 2021-07-22: qty 0.5, 1d supply, fill #0
  Filled 2021-07-28 – 2021-10-15 (×4): qty 0.5, 1d supply, fill #1

## 2021-07-23 ENCOUNTER — Other Ambulatory Visit (HOSPITAL_COMMUNITY): Payer: Self-pay

## 2021-07-28 ENCOUNTER — Other Ambulatory Visit (HOSPITAL_COMMUNITY): Payer: Self-pay | Admitting: Internal Medicine

## 2021-07-28 ENCOUNTER — Other Ambulatory Visit (HOSPITAL_COMMUNITY): Payer: Self-pay

## 2021-07-28 DIAGNOSIS — Z1231 Encounter for screening mammogram for malignant neoplasm of breast: Secondary | ICD-10-CM

## 2021-07-30 ENCOUNTER — Ambulatory Visit (HOSPITAL_COMMUNITY)
Admission: RE | Admit: 2021-07-30 | Discharge: 2021-07-30 | Disposition: A | Discharge status: Home / Self Care | Payer: Medicare Other | Source: Ambulatory Visit | Attending: Internal Medicine | Admitting: Internal Medicine

## 2021-07-30 ENCOUNTER — Other Ambulatory Visit: Payer: Self-pay

## 2021-07-30 DIAGNOSIS — Z1231 Encounter for screening mammogram for malignant neoplasm of breast: Secondary | ICD-10-CM | POA: Diagnosis present

## 2021-08-11 ENCOUNTER — Other Ambulatory Visit (HOSPITAL_COMMUNITY): Payer: Self-pay

## 2021-08-18 ENCOUNTER — Other Ambulatory Visit (HOSPITAL_COMMUNITY): Payer: Self-pay

## 2021-08-27 ENCOUNTER — Inpatient Hospital Stay: Payer: Medicare Other | Admitting: Oncology

## 2021-08-27 ENCOUNTER — Other Ambulatory Visit: Payer: Self-pay

## 2021-08-27 ENCOUNTER — Inpatient Hospital Stay: Payer: Medicare Other | Attending: Oncology

## 2021-08-27 VITALS — BP 129/68 | HR 76 | Temp 97.8°F | Resp 16 | Ht 64.0 in | Wt 170.8 lb

## 2021-08-27 DIAGNOSIS — Z95828 Presence of other vascular implants and grafts: Secondary | ICD-10-CM

## 2021-08-27 DIAGNOSIS — Z8551 Personal history of malignant neoplasm of bladder: Secondary | ICD-10-CM | POA: Diagnosis present

## 2021-08-27 DIAGNOSIS — C689 Malignant neoplasm of urinary organ, unspecified: Secondary | ICD-10-CM | POA: Diagnosis not present

## 2021-08-27 DIAGNOSIS — D6481 Anemia due to antineoplastic chemotherapy: Secondary | ICD-10-CM | POA: Insufficient documentation

## 2021-08-27 DIAGNOSIS — Z86718 Personal history of other venous thrombosis and embolism: Secondary | ICD-10-CM | POA: Diagnosis not present

## 2021-08-27 DIAGNOSIS — C679 Malignant neoplasm of bladder, unspecified: Secondary | ICD-10-CM

## 2021-08-27 DIAGNOSIS — T451X5A Adverse effect of antineoplastic and immunosuppressive drugs, initial encounter: Secondary | ICD-10-CM | POA: Insufficient documentation

## 2021-08-27 DIAGNOSIS — D630 Anemia in neoplastic disease: Secondary | ICD-10-CM | POA: Diagnosis not present

## 2021-08-27 LAB — CBC WITH DIFFERENTIAL (CANCER CENTER ONLY)
Abs Immature Granulocytes: 0.03 10*3/uL (ref 0.00–0.07)
Basophils Absolute: 0.1 10*3/uL (ref 0.0–0.1)
Basophils Relative: 1 %
Eosinophils Absolute: 0.3 10*3/uL (ref 0.0–0.5)
Eosinophils Relative: 5 %
HCT: 35.2 % — ABNORMAL LOW (ref 36.0–46.0)
Hemoglobin: 11.8 g/dL — ABNORMAL LOW (ref 12.0–15.0)
Immature Granulocytes: 0 %
Lymphocytes Relative: 23 %
Lymphs Abs: 1.7 10*3/uL (ref 0.7–4.0)
MCH: 33.4 pg (ref 26.0–34.0)
MCHC: 33.5 g/dL (ref 30.0–36.0)
MCV: 99.7 fL (ref 80.0–100.0)
Monocytes Absolute: 0.6 10*3/uL (ref 0.1–1.0)
Monocytes Relative: 8 %
Neutro Abs: 4.6 10*3/uL (ref 1.7–7.7)
Neutrophils Relative %: 63 %
Platelet Count: 248 10*3/uL (ref 150–400)
RBC: 3.53 MIL/uL — ABNORMAL LOW (ref 3.87–5.11)
RDW: 12.3 % (ref 11.5–15.5)
WBC Count: 7.3 10*3/uL (ref 4.0–10.5)
nRBC: 0 % (ref 0.0–0.2)

## 2021-08-27 LAB — CMP (CANCER CENTER ONLY)
ALT: 10 U/L (ref 0–44)
AST: 17 U/L (ref 15–41)
Albumin: 4.1 g/dL (ref 3.5–5.0)
Alkaline Phosphatase: 47 U/L (ref 38–126)
Anion gap: 6 (ref 5–15)
BUN: 24 mg/dL — ABNORMAL HIGH (ref 8–23)
CO2: 26 mmol/L (ref 22–32)
Calcium: 9.3 mg/dL (ref 8.9–10.3)
Chloride: 106 mmol/L (ref 98–111)
Creatinine: 1.23 mg/dL — ABNORMAL HIGH (ref 0.44–1.00)
GFR, Estimated: 45 mL/min — ABNORMAL LOW (ref >60)
Glucose, Bld: 95 mg/dL (ref 70–99)
Potassium: 5 mmol/L (ref 3.5–5.1)
Sodium: 138 mmol/L (ref 135–145)
Total Bilirubin: 0.8 mg/dL (ref 0.3–1.2)
Total Protein: 7.2 g/dL (ref 6.5–8.1)

## 2021-08-27 NOTE — Progress Notes (Signed)
Hematology and Oncology Follow Up Visit  Abel Ra [id number] 05-Jun-1946 76 y.o. 08/27/2021 9:56 AM Jimmye Norman [name], MDWilliams, [name], MD   Principle Diagnosis: 76 year old with T2N0 high-grade urothelial carcinoma with small cell features diagnosed in 2021.  She achieved complete response with radical cystectomy after neoadjuvant chemotherapy.  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  Neoadjuvant chemotherapy with gemcitabine and cisplatin started on March 24th 2021.   She completed 3 cycles of therapy on Nov 14, 2019.  She is status post robotic assisted laparoscopic cystectomy and lymphadenectomy completed on January 17, 2020.  She had a complete response to therapy with no residual disease on surgery.  Current therapy: Active surveillance.  Interim History: Ms. Pho returns today for repeat follow-up.  Since the last visit, she reports no major changes in her health.  She denies any recent hospitalizations or illnesses.  She denies any chest pain or shortness of breath.  She denies any abdominal discomfort.    Medications: Updated on review. Current Outpatient Medications  Medication Sig Dispense Refill   amoxicillin (AMOXIL) 500 MG capsule Take 4 capsules (2,000 mg total) by mouth 1 hour before procedure 4 capsule 1   colestipol (COLESTID) 1 g tablet Take up to 6 tablets by mouth as needed no more than once daily for chronic biliary diarrhea 180 tablet 3   COVID-19 mRNA bivalent vaccine, Pfizer, (PFIZER COVID-19 VAC BIVALENT) injection Inject into the muscle. 0.3 mL 0   ibandronate (BONIVA) 150 MG tablet Take 1 tablet by mouth once a month on an empty stomach with a large amount of water. 4 tablet 6   levothyroxine (SYNTHROID) 88 MCG tablet Take 1 tablet (88 mcg total) by mouth daily before breakfast. 90 tablet 3   losartan (COZAAR) 25 MG tablet TAKE 1 TABLET BY MOUTH ONCE  DAILY 90 tablet 3   Menaquinone-7 (VITAMIN K2 PO) Take by mouth.     Multiple Vitamins-Minerals (ALGAE BASED CALCIUM) TABS Take 500 mg by mouth daily.     pantoprazole (PROTONIX) 20 MG tablet Take 1 tablet (20 mg total) by mouth daily. 90 tablet 3   rivaroxaban (XARELTO) 10 MG TABS tablet Take 1 tablet (10 mg total) by mouth daily. 90 tablet 3   Zoster Vaccine Adjuvanted [facility]) injection Inject 0.5 mLs into the muscle. 0.5 mL 1   No current facility-administered medications for this visit.     Allergies:  Allergies  Allergen Reactions   Ace Inhibitors Cough      Physical Exam:     Blood pressure 129/68, pulse 76, temperature 97.8 F (36.6 C), temperature source Temporal, resp. rate 16, height 5\' 4"  (1.626 m), weight 170 lb 12.8 oz (77.5 kg), SpO2 100 %.    ECOG: 1    General appearance: Alert, awake without any distress. Head: Atraumatic without abnormalities Oropharynx: Without any thrush or ulcers. Eyes: No scleral icterus. Lymph nodes: No lymphadenopathy noted in the cervical, supraclavicular, or axillary nodes Heart:regular rate and rhythm, without any murmurs or gallops.   Lung: Clear to auscultation without any rhonchi, wheezes or dullness to percussion. Abdomin: Soft, nontender without any shifting dullness or ascites. Musculoskeletal: No clubbing or cyanosis. Neurological: No motor or sensory deficits. Skin: No rashes or lesions.       Lab Results: Lab Results  Component Value Date   WBC 9.2 02/25/2021   HGB 11.3 (L) 02/25/2021   HCT 34.1 (L) 02/25/2021  MCV 101.5 (H) 02/25/2021   PLT 271 02/25/2021     Chemistry      Component Value Date/Time   NA 136 02/25/2021 1005   NA 143 06/13/2019 1520   K 4.7 02/25/2021 1005   CL 102 02/25/2021 1005   CO2 23 02/25/2021 1005   BUN 21 02/25/2021 1005   BUN 15 06/13/2019 1520   CREATININE 1.30 (H) 02/25/2021 1005   CREATININE 0.78 12/13/2014 0954      Component Value Date/Time   CALCIUM 9.9  02/25/2021 1005   ALKPHOS 72 02/25/2021 1005   AST 16 02/25/2021 1005   ALT 9 02/25/2021 1005   BILITOT 1.1 02/25/2021 1005          Impression and Plan:   76 year old woman with:   1.    T2N0 urothelial carcinoma with small cell features diagnosed in 2021.  She achieved a complete response with neoadjuvant chemotherapy and underwent a radical cystectomy.     She is currently on active surveillance without any evidence of relapse at this time.  Imaging studies obtained in July 2022 did not show any evidence of metastatic disease.  Salvage therapy options including immunotherapy, antibody drug conjugate as well as oral targeted therapy were reviewed and these will be deferred unless she has metastatic disease.  She is agreeable to proceed at this time.  Repeat imaging studies will be arranged for July 2023 in the care of Dr. Tresa Moore     2.  History of recurrent thrombosis related to prothrombin gene mutation: She is currently on Xarelto without any complications.  I recommended continuing for the time being indefinitely.  She is considering switching back to warfarin for financial reasons.  I have no objections to that at this time.  3.  Age-appropriate cancer screening: She is up-to-date including mammography.   4.  Anemia: Related to malignancy and previous chemotherapy exposure.    5.  Follow-up: In 6 months for repeat evaluation and annually after that.   30  minutes were dedicated to this visit.  The time was spent on reviewing laboratory data, disease status update, future treatment choices and answering questions regarding future plan of care.        Zola Button, MD 2/22/20239:56 AM

## 2021-08-28 ENCOUNTER — Telehealth: Payer: Self-pay | Admitting: Oncology

## 2021-08-28 NOTE — Telephone Encounter (Signed)
Scheduled appt per 2/22 los. Pt aware.

## 2021-09-18 ENCOUNTER — Encounter: Payer: Self-pay | Admitting: Internal Medicine

## 2021-09-18 ENCOUNTER — Ambulatory Visit (INDEPENDENT_AMBULATORY_CARE_PROVIDER_SITE_OTHER): Payer: Medicare Other | Admitting: Internal Medicine

## 2021-09-18 VITALS — BP 125/61 | HR 85 | Temp 98.0°F | Ht 64.0 in | Wt 170.5 lb

## 2021-09-18 DIAGNOSIS — N179 Acute kidney failure, unspecified: Secondary | ICD-10-CM | POA: Insufficient documentation

## 2021-09-18 DIAGNOSIS — K9089 Other intestinal malabsorption: Secondary | ICD-10-CM

## 2021-09-18 DIAGNOSIS — D6869 Other thrombophilia: Secondary | ICD-10-CM

## 2021-09-18 DIAGNOSIS — Z8639 Personal history of other endocrine, nutritional and metabolic disease: Secondary | ICD-10-CM

## 2021-09-18 DIAGNOSIS — K219 Gastro-esophageal reflux disease without esophagitis: Secondary | ICD-10-CM

## 2021-09-18 DIAGNOSIS — Z95828 Presence of other vascular implants and grafts: Secondary | ICD-10-CM

## 2021-09-18 DIAGNOSIS — M810 Age-related osteoporosis without current pathological fracture: Secondary | ICD-10-CM

## 2021-09-18 DIAGNOSIS — I10 Essential (primary) hypertension: Secondary | ICD-10-CM | POA: Diagnosis not present

## 2021-09-18 DIAGNOSIS — M1711 Unilateral primary osteoarthritis, right knee: Secondary | ICD-10-CM

## 2021-09-18 DIAGNOSIS — E663 Overweight: Secondary | ICD-10-CM

## 2021-09-18 NOTE — Assessment & Plan Note (Signed)
Frequency of diarrhea continues to decline.  She may need a less Colestid a couple of times a week but in general is doing quite well and in this regard. ?

## 2021-09-18 NOTE — Assessment & Plan Note (Signed)
Pantoprazole is the only acid modifying medicine which is controlled her nighttime cough.  Unfortunately it is a risk factor for her known osteoporosis which is currently being treated.  No change at this time. ?

## 2021-09-18 NOTE — Assessment & Plan Note (Signed)
125/61, excellent control on current regimen of losartan 25 mg daily.  No change in therapy. ?

## 2021-09-18 NOTE — Progress Notes (Signed)
Pamela [name] is here for routine 54-monthfollow-up-she is treated for hypertension, osteoporosis, hypothyroidism, and in the past couple of years has undergone a right total knee replacement and successful treatment of an aggressive urothelial cancer managed with surgery and chemotherapy. ? ?Feels great, looks terrific. ?Lost appetite a couple of years ago, 30# down over that time period though weight has been stable for over 6 months. ? ?While she has no acute complaints, she is anticipating some stress in near future-moving from her childhood home to an apartment, which will entail sorting and selling and more physical activity than she is accustomed to.  She will be in a single level dwelling which will be much easier for her. ? ?She reports recent change in medication.  Because creatinine had gone up in 07/2021,  Dr. BLayne Benton(to whom she was referred for osteoporosis by urologist) [facility]ia twice a year. ? ?Longstanding history of bile acid malabsorption diarrhea- hasn't needed colestipol since the surgery.  For the last couple of months, has taken only a couple of pills a week.  No explanation for this improvement, but she is pleased.  She only takes this when there is a loose morning bm -moves bowels daily now, infrequently twice daily. ? ?Financial difficulties regarding Xarelto -at the end of 06/2021 - tried to get a refill and cost had quadrupled.  Medicare managed care reported that she'd hit the donut hole.  Clock reset in January and she is ok now.  July is donut hole again.  Wouldn't mind going back to warfarain.   I will check into Eliquis. ? ?Taking vit D 3,multivit, K2, calcium upon advice of Dr. BCaryl Comesgood at taking every day but at least qod.  ? ?Exam  ? ?BP 125/61 (BP Location: Right Arm, Patient Position: Sitting, Cuff Size: Small)   Pulse 85   Temp 98 ?F (36.7 ?C) (Oral)   Ht '5\' 4"'$  (1.626 m)   Wt 170 lb 8 oz (77.3 kg)   SpO2 100%   BMI 29.27 kg/m?  ? ?Looks  terrific ?R forearm 1 cm red spot with scaling, has AK appearance.  assessment.   ?Varicose veins right leg, 1+ dp pulses bilat ?R TKR scar looks great. ?Gait is normal, without antalgia. ? ?Assessment and plan (see also problem based documentation) ? ?AKI on labs 07/2021-rechecked 3wks ago and had already improved.  She wishes to hold off on further recheck until more time is passed.  She had not use NSAIDs and did not feel that she was volume depleted at the time of the January renal finding.  We discussed importance of adequate fluid intake. ? ?I will check on cost of Eliquis.  I will check on calcium and VK2 with respect to kidney function per her request. ? ?No changes in meds today. ?

## 2021-09-18 NOTE — Assessment & Plan Note (Signed)
Succesfully underwent right TKR with complete healing, normal gait, doing extremely well.  Will resolve problem. ?

## 2021-09-18 NOTE — Assessment & Plan Note (Signed)
Vitamin B12 658 in 03/2020.  She is on a multivitamin.  We will check this periodically. ?

## 2021-09-18 NOTE — Assessment & Plan Note (Signed)
[name] has been changed to Pamela [name] per Dr. Layne Benton, family medicine with orthopedic group, to whom her urologist had referred her to address osteoporosis.  Because Dr. Layne Benton does not share our EMR and this problem was being managed in our clinic, I will discuss with Dr. Layne Benton and with Pamela [name] if we can resume her follow-up care here. ?

## 2021-09-18 NOTE — Assessment & Plan Note (Addendum)
07/24/21 - bmp at [facility] creatinine 1.49 and eGFR 36.  No known hypovolemia or use of NSAIDS, boniva was discontinued in response (changed to Prolia).  Recheck 3 weeks ago showed improvement.  I will monitor at a future visit although suspect that she will be seen by another provider before then. ?

## 2021-09-18 NOTE — Assessment & Plan Note (Signed)
Weight remains stable around 170 since 12/2020.  Now that her knee has healed, she looks forward to being more mobile. ?

## 2021-09-18 NOTE — Assessment & Plan Note (Signed)
Xarelto caused her to fall into the donut hole at the end of December.  When the clock reset 07/2021, she was then able to 48 again, but states that she will be back in the donut hole in 6 months.  If Eliquis is not cheaper, she wishes to return to Coumadin.  I will look into this and discussed with Dr. Elie Confer. ?

## 2021-09-23 ENCOUNTER — Other Ambulatory Visit (HOSPITAL_COMMUNITY): Payer: Self-pay

## 2021-09-25 ENCOUNTER — Ambulatory Visit (INDEPENDENT_AMBULATORY_CARE_PROVIDER_SITE_OTHER): Payer: Medicare Other | Admitting: Internal Medicine

## 2021-09-25 ENCOUNTER — Other Ambulatory Visit (HOSPITAL_COMMUNITY): Payer: Self-pay

## 2021-09-25 DIAGNOSIS — U071 COVID-19: Secondary | ICD-10-CM

## 2021-09-25 HISTORY — DX: COVID-19: U07.1

## 2021-09-25 MED ORDER — MOLNUPIRAVIR 200 MG PO CAPS
4.0000 | ORAL_CAPSULE | Freq: Two times a day (BID) | ORAL | 0 refills | Status: AC
  Filled 2021-09-25: qty 40, 5d supply, fill #0

## 2021-09-25 NOTE — Progress Notes (Signed)
?  [facility] Telephone Encounter ?Continuity Care Appointment ? ?HPI:  ?This telephone encounter was created for [name] on 09/25/2021 for the following purpose/cc COVID infection.  ? ?Past Medical History:  ?Past Medical History:  ?Diagnosis Date  ? Anemia   ? Anemia requiring transfusions 06/03/2006  ? Required post operative transfusion 01/2020. Mixed anemia with hx of macrocytic B12 deficiency anemia.   ? Arthritis   ? CHOLELITHIASIS, WITH OBSTRUCTION 04/21/2006  ? s/p ERCP,sprincterotomy, stent ([facility])  ? Complication of anesthesia   ? DVT (deep venous thrombosis) ([place])   ? hx of   ? 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 GERD (gastroesophageal reflux disease)   ? GLAUCOMA 04/24/2008  ? resolved per pt as of preop on 12/04/20   ? HYPERLIPIDEMIA 06/03/2006  ? Hypertension   ? Hypothyroidism lifelong  ? OBESITY, MILD 04/24/2008  ? Osteoarthritis of right knee, now s/p R TKR 03/02/2019  ? Dr. Percell Miller with [facility] is following.  ? PONV (postoperative nausea and vomiting)   ? S/P total knee arthroplasty, right 12/10/2020  ? Urothelial cancer ([place]) dx'd 09/05/2019  ?  ? ?ROS:  ?  ? ?Assessment / Plan / Recommendations:  ?Please see A&P under problem oriented charting for assessment of the patient's acute and chronic medical conditions.  ?As always, pt is advised that if symptoms worsen or new symptoms arise, they should go to an urgent care facility or to to ER for further evaluation.  ? ?Consent and Medical Decision Making:  ?Patient discussed with Dr. Daryll Drown ?This is a telephone encounter between Pamela [name] and Orvis Brill on 09/25/2021 for COVID infection. The visit was conducted with the patient located at home and Orvis Brill at [facility]. The patient's identity was confirmed using their DOB and current address. The patient has consented to being evaluated through a telephone encounter and understands the  associated risks (an examination cannot be done and the patient may need to come in for an appointment) / benefits (allows the patient to remain at home, decreasing exposure to coronavirus). I personally spent 12 minutes on medical discussion.  ?  ? ? ?

## 2021-09-25 NOTE — Patient Instructions (Addendum)
Patient will take molnupiravir 800 mg twice daily for 5 days. Return precautions discussed.  ?

## 2021-09-25 NOTE — Assessment & Plan Note (Signed)
The patient is here today for a virtual visit due to a positive at-home COVID test this morning.  She states that she was getting lunch with a friend last Monday, and her friend called her later that week telling her that she had tested positive for COVID.  Yesterday, the patient noticed a worsening headache, fever to 101, body aches, and slight congestion.  This morning, she tested positive for COVID prompting her to be seen here.  She denies shortness of breath or chest pain.  No sore throat.  She does have a cough productive of clear phlegm.  Denies change in taste.  She has taken Tylenol for her headache.  She is fully vaccinated against COVID and has had 2 booster shots. ? ?Plan: ?Given that her symptom onset was 1 day ago, recommend treatment.  Considered Paxlovid, however the patient is on Xarelto, therefore will start treatment with molnupiravir for 5 days.  Patient is in agreement this plan.  Return precautions discussed. ?

## 2021-09-30 ENCOUNTER — Other Ambulatory Visit (HOSPITAL_COMMUNITY): Payer: Self-pay

## 2021-10-02 ENCOUNTER — Other Ambulatory Visit (HOSPITAL_COMMUNITY): Payer: Self-pay

## 2021-10-02 NOTE — Progress Notes (Signed)
Internal Medicine Clinic Attending ? ?Case discussed with Dr. Lorin Glass  at the time of the visit.  We reviewed the resident?s history and exam and pertinent patient test results.  I agree with the assessment, diagnosis, and plan of care documented in the resident?s note.  ? ?Review of symptoms is as noted in Dr. Evette Georges HPI and discussion.  ?

## 2021-10-15 ENCOUNTER — Other Ambulatory Visit (HOSPITAL_COMMUNITY): Payer: Self-pay

## 2021-10-20 ENCOUNTER — Other Ambulatory Visit (HOSPITAL_COMMUNITY): Payer: Self-pay

## 2021-10-27 ENCOUNTER — Other Ambulatory Visit (HOSPITAL_COMMUNITY): Payer: Self-pay

## 2021-10-27 MED ORDER — AMOXICILLIN 500 MG PO CAPS
2000.0000 mg | ORAL_CAPSULE | ORAL | 1 refills | Status: DC
  Filled 2021-10-27: qty 16, 4d supply, fill #0

## 2021-11-03 ENCOUNTER — Other Ambulatory Visit (HOSPITAL_COMMUNITY): Payer: Self-pay

## 2021-11-18 ENCOUNTER — Other Ambulatory Visit: Payer: Self-pay

## 2021-11-18 ENCOUNTER — Other Ambulatory Visit (HOSPITAL_COMMUNITY): Payer: Self-pay

## 2021-11-18 MED ORDER — LOSARTAN POTASSIUM 25 MG PO TABS
25.0000 mg | ORAL_TABLET | Freq: Every day | ORAL | 3 refills | Status: DC
  Filled 2021-11-18: qty 90, 90d supply, fill #0
  Filled 2022-02-16: qty 90, 90d supply, fill #1
  Filled 2022-05-24: qty 90, 90d supply, fill #2

## 2021-11-19 ENCOUNTER — Other Ambulatory Visit (HOSPITAL_COMMUNITY): Payer: Self-pay

## 2021-12-30 ENCOUNTER — Other Ambulatory Visit: Payer: Self-pay | Admitting: *Deleted

## 2021-12-30 DIAGNOSIS — D682 Hereditary deficiency of other clotting factors: Secondary | ICD-10-CM

## 2022-01-07 ENCOUNTER — Other Ambulatory Visit (HOSPITAL_COMMUNITY): Payer: Self-pay

## 2022-01-07 MED ORDER — RIVAROXABAN 10 MG PO TABS
10.0000 mg | ORAL_TABLET | Freq: Every day | ORAL | 0 refills | Status: DC
  Filled 2022-01-07: qty 30, 30d supply, fill #0

## 2022-01-07 MED ORDER — RIVAROXABAN 10 MG PO TABS
10.0000 mg | ORAL_TABLET | Freq: Every day | ORAL | 0 refills | Status: DC
  Filled 2022-01-08: qty 30, 30d supply, fill #0

## 2022-01-07 NOTE — Telephone Encounter (Signed)
Plan RF for one month and in the meantime will get her reestablished with Dr. Gladstone Pih coumadin clinic where she was previously managed.

## 2022-01-07 NOTE — Telephone Encounter (Signed)
Called pt - made aware Xarelto refill and appt schedule w/Dr Elie Confer coumadin clinic Monday 7/17 @ 10AM.

## 2022-01-08 ENCOUNTER — Other Ambulatory Visit (HOSPITAL_COMMUNITY): Payer: Self-pay

## 2022-01-12 ENCOUNTER — Other Ambulatory Visit (HOSPITAL_COMMUNITY): Payer: Self-pay

## 2022-01-12 ENCOUNTER — Other Ambulatory Visit: Payer: Self-pay

## 2022-01-12 MED ORDER — PREDNISONE 10 MG PO TABS
ORAL_TABLET | ORAL | 0 refills | Status: DC
  Filled 2022-01-12: qty 48, 12d supply, fill #0

## 2022-01-12 MED ORDER — COLCHICINE 0.6 MG PO TABS
ORAL_TABLET | ORAL | 1 refills | Status: DC
  Filled 2022-01-12: qty 20, 10d supply, fill #0

## 2022-01-15 ENCOUNTER — Other Ambulatory Visit (HOSPITAL_COMMUNITY): Payer: Self-pay

## 2022-01-19 ENCOUNTER — Ambulatory Visit: Payer: Medicare Other

## 2022-01-20 ENCOUNTER — Telehealth: Payer: Self-pay | Admitting: Oncology

## 2022-01-20 NOTE — Telephone Encounter (Signed)
Rescheduled August appointments per provider pal, called to inform patient about new upcoming rescheduled appointment. Patient is notified.

## 2022-01-26 ENCOUNTER — Other Ambulatory Visit: Payer: Self-pay

## 2022-01-26 ENCOUNTER — Ambulatory Visit: Payer: Medicare Other | Admitting: Pharmacist

## 2022-01-26 ENCOUNTER — Other Ambulatory Visit (HOSPITAL_COMMUNITY): Payer: Self-pay

## 2022-01-26 DIAGNOSIS — Z7901 Long term (current) use of anticoagulants: Secondary | ICD-10-CM | POA: Insufficient documentation

## 2022-01-26 DIAGNOSIS — D6869 Other thrombophilia: Secondary | ICD-10-CM

## 2022-01-26 MED ORDER — WARFARIN SODIUM 2 MG PO TABS
2.0000 mg | ORAL_TABLET | Freq: Every day | ORAL | 11 refills | Status: DC
  Filled 2022-01-26: qty 30, 30d supply, fill #0
  Filled 2022-02-16: qty 30, 30d supply, fill #1

## 2022-01-26 NOTE — Progress Notes (Signed)
Anticoagulation Management Pamela [name] is a 76 y.o. female who reports to the clinic for monitoring of warfarin treatment.    Indication:  Primary hypercoagulable state. Patient wishes to discontinue rivaroxaban/Xarelto and transition back upon warfarin. Will overlap/bridge the Xa inhibitor for four days, then discontinue rivaroxaban/Xarelto and CONTINUE warfarin, '2mg'$  PO qd until next INR on Monday 31-JUL-23.   Duration: indefinite Supervising physician:  Elberta Leatherwood, MD  Anticoagulation Clinic Visit History: Patient does not report signs/symptoms of bleeding or thromboembolism  Other recent changes: No diet, medications, lifestyle changes.  Anticoagulation Episode Summary     Current INR goal:  2.0-3.0  TTR:  69.3 % (10 y)  Next INR check:  02/02/2022  INR from last check:    Weekly max warfarin dose:    Target end date:  Indefinite  INR check location:  Anticoagulation Clinic  Preferred lab:    Send INR reminders to:  ANTICO[facility]   Indications   Secondary hypercoagulable state ([place]) [D68.69] DVT HX OF (Resolved) [Z86.718]        Comments:          Anticoagulation Care Providers     Provider Role Specialty Phone number   Burman Freestone, MD  Internal Medicine [phone]       Allergies  Allergen Reactions   Ace Inhibitors Cough    Current Outpatient Medications:    colestipol (COLESTID) 1 g tablet, Take up to 6 tablets by mouth as needed no more than once daily for chronic biliary diarrhea, Disp: 180 tablet, Rfl: 3   denosumab (PROLIA) 60 MG/ML SOSY injection, Inject 60 mg into the skin every 6 (six) months., Disp: , Rfl:    levothyroxine (SYNTHROID) 88 MCG tablet, Take 1 tablet (88 mcg total) by mouth daily before breakfast., Disp: 90 tablet, Rfl: 3   losartan (COZAAR) 25 MG tablet, Take 1 tablet (25 mg total) by mouth daily., Disp: 90 tablet, Rfl: 3   Multiple Vitamins-Minerals (ALGAE BASED CALCIUM) TABS, Take 500 mg by mouth daily., Disp: , Rfl:     pantoprazole (PROTONIX) 20 MG tablet, Take 1 tablet (20 mg total) by mouth daily., Disp: 90 tablet, Rfl: 3   warfarin (COUMADIN) 2 MG tablet, Take 1 tablet (2 mg total) by mouth daily., Disp: 30 tablet, Rfl: 11   amoxicillin (AMOXIL) 500 MG capsule, Take 4 capsules (2,000 mg total) by mouth 1 hour before dental treatment (Patient not taking: Reported on 01/26/2022), Disp: 16 capsule, Rfl: 1   colchicine 0.6 MG tablet, Take 1 tablet by mouth twice a day for gout flare ups (Patient not taking: Reported on 01/26/2022), Disp: 20 tablet, Rfl: 1   COVID-19 mRNA bivalent vaccine, Pfizer, (PFIZER COVID-19 VAC BIVALENT) injection, Inject into the muscle. (Patient not taking: Reported on 01/26/2022), Disp: 0.3 mL, Rfl: 0   predniSONE (DELTASONE) 10 MG tablet, Take 6 tabets by mouth for 4 days and then take 4 tablets by mouth for 4 days and then 2 tablets by mouth for 4 days. Drink plenty of water with this mediciine. (Patient not taking: Reported on 01/26/2022), Disp: 48 tablet, Rfl: 0   Zoster Vaccine Adjuvanted [facility]) injection, Inject 0.5 mLs into the muscle. (Patient not taking: Reported on 01/26/2022), Disp: 0.5 mL, Rfl: 1 Past Medical History:  Diagnosis Date   Anemia    Anemia requiring transfusions 06/03/2006   Required post operative transfusion 01/2020. Mixed anemia with hx of macrocytic B12 deficiency anemia.    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 Percell Miller with [facility] is following.   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       Current Social History 10/18/2019        Patient lives alone in a 2 level home where laundry is in the basement. There is one step without handrail up to the entrance patient uses.      Patient's method of transportation is personal car.      The highest level of education was advanced degree; Master's in Education.      The patient currently retired.      Identified important Relationships are My daughter, good friend, Manuela Schwartz who is starting with dementia. Several women from church and [place].      Pets : None       Interests / Fun: Read a lot, take walks, [employer] with church groups and [place] groups.       Current Stressors: "Cancer. And unable to get knee replacement last year 2/2 Covid pandemic and this year 2/2 Cancer. Hair is starting to fall out 2/2 chemo"      Religious / Personal Beliefs: "Episcopalian, Christian. I believe in God and [place]."       L. Ducatte, BSN, RN-BC        Social Determinants of Health   Financial Resource Strain: Not on file  Food Insecurity: Not on file  Transportation Needs: Not on file  Physical Activity: Not on file  Stress: Not on file  Social Connections: Not on file   Family History  Problem Relation Age of Onset   Cancer Mother        H&N, smoker   Liver disease Mother    Cancer Sister  lung, 2011, smoker   Other Other        grandmother had mastectomy in her 56's unknown reason   Macular degeneration Father    Diverticulitis Father    Breast cancer Neg Hx     ASSESSMENT Recent Results: The most recent result is correlated with 0 mg per week: Lab Results  Component Value Date   INR 1.2 12/10/2020   INR 2.5 11/11/2020   INR 2.2 09/30/2020    Anticoagulation Dosing: Description   Take one (1) of your '2mg'$  strength warfarin tablets by mouth, once-daily.      INR today:  Baseline.  PLAN Weekly dose was increased by recommencing the '2mg'$  dose of warfarin that had sustained her INR as therapeutic for six months prior to her discontinuing warfarin and commencing upon Xarelto.   Patient Instructions  Patient instructed to take medications as defined in the Anti-coagulation Track section of this encounter.  Patient instructed to take today's dose.  Patient instructed to take one (1) of your '2mg'$  strength warfarin tablets by mouth, once-daily.  Patient verbalized understanding of these instructions.   Patient advised to contact clinic or seek medical attention if signs/symptoms of bleeding or thromboembolism occur.  Patient verbalized understanding by repeating back information and was advised to contact me if further medication-related questions arise. Patient was also provided an information handout.  Follow-up Return in 1 week (on 02/02/2022) for Follow up INR.  Pennie Banter, PharmD, CPP  15 minutes spent face-to-face with the patient during the encounter. 50% of time spent on education, including  signs/sx bleeding and clotting, as well as food and drug interactions with warfarin. 50% of time was spent on fingerprick POC INR sample collection,processing, results determination, and documentation in [email or web address].[email or web address]/.

## 2022-01-26 NOTE — Patient Instructions (Signed)
Patient instructed to take medications as defined in the Anti-coagulation Track section of this encounter.  Patient instructed to take today's dose.  Patient instructed to take one (1) of your '2mg'$  strength warfarin tablets by mouth, once-daily.  Patient verbalized understanding of these instructions.

## 2022-02-02 ENCOUNTER — Other Ambulatory Visit (HOSPITAL_COMMUNITY): Payer: Self-pay

## 2022-02-02 ENCOUNTER — Ambulatory Visit (INDEPENDENT_AMBULATORY_CARE_PROVIDER_SITE_OTHER): Payer: Medicare Other | Admitting: Pharmacist

## 2022-02-02 DIAGNOSIS — D6869 Other thrombophilia: Secondary | ICD-10-CM

## 2022-02-02 DIAGNOSIS — Z7901 Long term (current) use of anticoagulants: Secondary | ICD-10-CM | POA: Diagnosis not present

## 2022-02-02 LAB — POCT INR: INR: 1.6 — AB (ref 2.0–3.0)

## 2022-02-02 NOTE — Progress Notes (Signed)
Anticoagulation Management Pamela [name] is a 76 y.o. female who reports to the clinic for monitoring of warfarin treatment.    Indication:  Primary hypercoagulable state; History of VTE; Long term current use of warfarin with target INR 2.0 -3.0. She has only had six days of therapy that provided this result.   Duration: indefinite Supervising physician:  Dorian Pod, MD  Anticoagulation Clinic Visit History: Patient does not report signs/symptoms of bleeding or thromboembolism  Other recent changes: No diet, medications, lifestyle changes.  Anticoagulation Episode Summary     Current INR goal:  2.0-3.0  TTR:  62.1 % (11.1 y)  Next INR check:  02/09/2022  INR from last check:  1.6 (02/02/2022)  Weekly max warfarin dose:    Target end date:  Indefinite  INR check location:  Anticoagulation Clinic  Preferred lab:    Send INR reminders to:  ANTICOAG IMP   Indications   Secondary hypercoagulable state ([place]) [D68.69] DVT HX OF (Resolved) [Z86.718] Long term (current) use of anticoagulants [Z79.01]        Comments:          Anticoagulation Care Providers     Provider Role Specialty Phone number   Burman Freestone, MD  Internal Medicine [phone]       Allergies  Allergen Reactions   Ace Inhibitors Cough    Current Outpatient Medications:    colestipol (COLESTID) 1 g tablet, Take up to 6 tablets by mouth as needed no more than once daily for chronic biliary diarrhea, Disp: 180 tablet, Rfl: 3   denosumab (PROLIA) 60 MG/ML SOSY injection, Inject 60 mg into the skin every 6 (six) months., Disp: , Rfl:    levothyroxine (SYNTHROID) 88 MCG tablet, Take 1 tablet (88 mcg total) by mouth daily before breakfast., Disp: 90 tablet, Rfl: 3   losartan (COZAAR) 25 MG tablet, Take 1 tablet (25 mg total) by mouth daily., Disp: 90 tablet, Rfl: 3   Multiple Vitamins-Minerals (ALGAE BASED CALCIUM) TABS, Take 500 mg by mouth daily., Disp: , Rfl:    pantoprazole (PROTONIX) 20 MG  tablet, Take 1 tablet (20 mg total) by mouth daily., Disp: 90 tablet, Rfl: 3   warfarin (COUMADIN) 2 MG tablet, Take 1 tablet (2 mg total) by mouth daily., Disp: 30 tablet, Rfl: 11   amoxicillin (AMOXIL) 500 MG capsule, Take 4 capsules (2,000 mg total) by mouth 1 hour before dental treatment (Patient not taking: Reported on 01/26/2022), Disp: 16 capsule, Rfl: 1   colchicine 0.6 MG tablet, Take 1 tablet by mouth twice a day for gout flare ups (Patient not taking: Reported on 01/26/2022), Disp: 20 tablet, Rfl: 1   COVID-19 mRNA bivalent vaccine, Pfizer, (PFIZER COVID-19 VAC BIVALENT) injection, Inject into the muscle. (Patient not taking: Reported on 01/26/2022), Disp: 0.3 mL, Rfl: 0   predniSONE (DELTASONE) 10 MG tablet, Take 6 tabets by mouth for 4 days and then take 4 tablets by mouth for 4 days and then 2 tablets by mouth for 4 days. Drink plenty of water with this mediciine. (Patient not taking: Reported on 01/26/2022), Disp: 48 tablet, Rfl: 0   Zoster Vaccine Adjuvanted [facility]) injection, Inject 0.5 mLs into the muscle. (Patient not taking: Reported on 01/26/2022), Disp: 0.5 mL, Rfl: 1 Past Medical History:  Diagnosis Date   Anemia    Anemia requiring transfusions 06/03/2006   Required post operative transfusion 01/2020. Mixed anemia with hx of macrocytic B12 deficiency anemia.    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 Percell Miller with [facility] is following.   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       Current Social History 10/18/2019        Patient lives alone in a 2 level home where laundry is in the basement. There is one step without handrail up to the entrance patient uses.      Patient's method of transportation is personal car.      The highest level of education was advanced degree; Master's in Education.      The patient currently retired.      Identified important Relationships are My daughter, good friend, Manuela Schwartz who is starting with dementia. Several women from church and [place].      Pets : None       Interests / Fun: Read a lot, take walks, [employer] with church groups and [place] groups.       Current Stressors: "Cancer. And unable to get knee replacement last year 2/2 Covid pandemic and this year 2/2 Cancer. Hair is starting to fall out 2/2 chemo"      Religious / Personal Beliefs: "Episcopalian, Christian. I believe in God and [place]."       L. Ducatte, BSN, RN-BC       Social Determinants of Health    Financial Resource Strain: Not on file  Food Insecurity: Not on file  Transportation Needs: Not on file  Physical Activity: Not on file  Stress: Not on file  Social Connections: Not on file   Family History  Problem Relation Age of Onset   Cancer Mother        H&N, smoker   Liver disease Mother    Cancer Sister  lung, 2011, smoker   Other Other        grandmother had mastectomy in her 71's unknown reason   Macular degeneration Father    Diverticulitis Father    Breast cancer Neg Hx     ASSESSMENT Recent Results: The most recent result is correlated with 14 mg per day for past six days having recommenced warfarin after shared decision making to discontinue DOAC therapy--citing cost.  Lab Results  Component Value Date   INR 1.6 (A) 02/02/2022   INR 1.2 12/10/2020   INR 2.5 11/11/2020    Anticoagulation Dosing: Description   Take one (1) of your '2mg'$  strength warfarin tablets by mouth, once-daily--EXCEPT on TUESDAY and FRIDAY--take one-and-one-half (1 & 1/2) of your '2mg'$  strength warfarin tablets on these two days. Return to clinic on Monday February 09, 2022 at 9:00 am.     INR today: Subtherapeutic  PLAN Weekly dose was increased by 14% to 16 mg per week  Patient Instructions  Patient instructed to take medications as defined in the Anti-coagulation Track section of this encounter.  Patient instructed to take today's dose.  Patient instructed to take one (1) of your '2mg'$  strength warfarin tablets by mouth, once-daily--EXCEPT on TUESDAY and FRIDAY--take one-and-one-half (1 & 1/2) of your '2mg'$  strength warfarin tablets on these two days. Return to clinic on Monday February 09, 2022 at 9:00 am. Patient verbalized understanding of these instructions.   Patient advised to contact clinic or seek medical attention if signs/symptoms of bleeding or thromboembolism occur.  Patient verbalized understanding by repeating back information and was advised to contact me if further  medication-related questions arise. Patient was also provided an information handout.  Follow-up Return in 1 week (on 02/09/2022) for Follow up INR.  Pennie Banter, PharmD, CPP  15 minutes spent face-to-face with the patient during the encounter. 50% of time spent on education, including signs/sx bleeding and clotting, as well as food and drug interactions with warfarin. 50% of time was spent on fingerprick POC INR sample collection,processing, results determination, and documentation in [email or web address].[email or web address]/.

## 2022-02-02 NOTE — Patient Instructions (Signed)
Patient instructed to take medications as defined in the Anti-coagulation Track section of this encounter.  Patient instructed to take today's dose.  Patient instructed to take one (1) of your '2mg'$  strength warfarin tablets by mouth, once-daily--EXCEPT on TUESDAY and FRIDAY--take one-and-one-half (1 & 1/2) of your '2mg'$  strength warfarin tablets on these two days. Return to clinic on Monday February 09, 2022 at 9:00 am. Patient verbalized understanding of these instructions.

## 2022-02-03 ENCOUNTER — Other Ambulatory Visit: Payer: Self-pay

## 2022-02-05 ENCOUNTER — Telehealth: Payer: Self-pay | Admitting: *Deleted

## 2022-02-05 NOTE — Telephone Encounter (Addendum)
Call from patient has a Urostomy bag.  Has a white creamy discharge at the Stoma.  Unable to wipe off. Unable to get an appointment with her Urologist office.  Was told to see her doctor here for assessment of.  No fevers.  Has a Urine smell that is not unusual.  No redness at the site .

## 2022-02-05 NOTE — Telephone Encounter (Signed)
RTC to patient given an appointment for 8:45 AM on Friday am for assessment of Urostomy site.

## 2022-02-06 ENCOUNTER — Ambulatory Visit (INDEPENDENT_AMBULATORY_CARE_PROVIDER_SITE_OTHER): Payer: Medicare Other | Admitting: Student

## 2022-02-06 ENCOUNTER — Encounter: Payer: Self-pay | Admitting: Student

## 2022-02-06 DIAGNOSIS — C689 Malignant neoplasm of urinary organ, unspecified: Secondary | ICD-10-CM

## 2022-02-06 NOTE — Patient Instructions (Addendum)
Thank you, [name] for allowing [place] to provide your care today. Today we discussed your stoma. There is some redness around the stoma but I was able to clean and remove the whitish material you were concern about. Your vitals look great.     Follow up with your urologist when you can and your cancer doctor as scheduled. Please call the office if the redness worsens, you start having fevers, chills, nausea, vomiting or abdominal pain  My Chart Access: [email or web address]  Please make sure to arrive 15 minutes prior to your next appointment. If you arrive late, you may be asked to reschedule.    We look forward to seeing you next time. Please call our clinic at [phone] if you have any questions or concerns. The best time to call is Monday-Friday from 9am-4pm, but there is someone available 24/7. If after hours or the weekend, call the main hospital number and ask for the Internal Medicine Resident On-Call. If you need medication refills, please notify your pharmacy one week in advance and they will send [place] a request.   Thank you for letting us take part in your care. Wishing you the best!  Lacinda Axon, MD 02/06/2022, 9:16 AM IM Resident, PGY-3 [facility]ian 41:10

## 2022-02-06 NOTE — Progress Notes (Signed)
   CC: Stoma site evaluation  HPI:  [name] is a 76 y.o. female with PMH as below who presents to clinic today for evaluation of her stoma. Please see problem based charting for evaluation, assessment and plan.  Past Medical History:  Diagnosis Date   Anemia    Anemia requiring transfusions 06/03/2006   Required post operative transfusion 01/2020. Mixed anemia with hx of macrocytic B12 deficiency anemia.    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 Percell Miller with [facility] is following.   PONV (postoperative nausea and vomiting)    S/P total knee arthroplasty, right 12/10/2020   Urothelial cancer ([place]) dx'd 09/05/2019    Review of Systems:  Constitutional: Negative for fever, chills or fatigue Abdomen: Negative for abdominal pain, nausea, vomiting, constipation or diarrhea.  Skin: Negative for rash. Positive for mild redness around stoma.   Physical Exam: General: Pleasant, well-appearing elderly woman.  No acute distress. Cardiac: RRR. No murmurs, rubs or gallops.  Respiratory: Lungs CTAB. No wheezing or crackles. Abdominal: Soft. NT/ND. Healthy stoma in the umbilicus with mild surrounding erythema draining clear urine. Site is nontender. Norma BS.  Skin: Warm, dry and intact without rashes or lesions  Vitals:   02/06/22 0918  BP: 132/74  Pulse: 79  Temp: 97.9 F (36.6 C)  TempSrc: Oral  SpO2: 100%  Weight: 167 lb 9.6 oz (76 kg)  Height: '5\' 4"'$  (1.626 m)    Assessment & Plan:   See  Encounters Tab for problem based charting.  Patient discussed with Dr. Lockie Pares, MD, MPH

## 2022-02-06 NOTE — Assessment & Plan Note (Addendum)
Patient with a history of urothelial cancer status post cystectomy, hysterectomy, left oophorectomy, node dissection and ileal conduit diversion in 2021 now with urostomy bag for the last 2 years. Patient presented today to be evaluated for a white creamy discharge from her stoma site. On evaluation, the stoma site looks healthy. A whitish material was cleaned off gauze and a new urostomy bag was placed by patient. There is mild erythema around the stoma site but no tenderness. Patient denies any fevers, chills, nausea, vomiting, abdominal pain or changes to her bowels. The volume of her urine has been stable.  Patient stoma site overall looks fine she has no evidence of systemic infection.  Advised patient to still follow-up with urology as soon as possible and to call the office if she has worsening redness around the stoma or signs of systemic infection. -Follow-up with urology and oncology -Closely monitor for signs of infection around site

## 2022-02-09 ENCOUNTER — Ambulatory Visit (INDEPENDENT_AMBULATORY_CARE_PROVIDER_SITE_OTHER): Payer: Medicare Other | Admitting: Pharmacist

## 2022-02-09 DIAGNOSIS — D6869 Other thrombophilia: Secondary | ICD-10-CM

## 2022-02-09 DIAGNOSIS — Z7901 Long term (current) use of anticoagulants: Secondary | ICD-10-CM

## 2022-02-09 LAB — POCT INR: INR: 1.4 — AB (ref 2.0–3.0)

## 2022-02-09 NOTE — Patient Instructions (Signed)
Patient instructed to take medications as defined in the Anti-coagulation Track section of this encounter.  Patient instructed to take today's dose.  Patient instructed to take one-and-one-half (1 & 1/2) of your '2mg'$  strength warfarin tablets, Monday through Friday. On Saturday and Sunday,k take only one (1) tablet on these two days. Return to clinic on Monday February 16, 2022 at 9:00 am. Patient verbalized understanding of these instructions.

## 2022-02-09 NOTE — Progress Notes (Signed)
Internal Medicine Clinic Attending  Case discussed with Dr. Amponsah  At the time of the visit.  We reviewed the resident's history and exam and pertinent patient test results.  I agree with the assessment, diagnosis, and plan of care documented in the resident's note.  

## 2022-02-09 NOTE — Progress Notes (Signed)
Anticoagulation Management Pamela [name] is a 76 y.o. female who reports to the clinic for monitoring of warfarin treatment.    Indication:  Hypercoagulable state, History of DVT; Long term current use of oral anticoagulant, warfarin. Target INR 2.0 -3.0.    Duration: indefinite Supervising physician:  Dorian Pod, MD  Anticoagulation Clinic Visit History: Patient does not report signs/symptoms of bleeding or thromboembolism  Other recent changes: No diet, medications, lifestyle changes endorsed. Previous dosing reviewed. Patient had been on as high as 20 - '22mg'$  weekly. Will increase dose.  Anticoagulation Episode Summary     Current INR goal:  2.0-3.0  TTR:  62.0 % (11.1 y)  Next INR check:  02/16/2022  INR from last check:  1.4 (02/09/2022)  Weekly max warfarin dose:    Target end date:  Indefinite  INR check location:  Anticoagulation Clinic  Preferred lab:    Send INR reminders to:  [facility]   Indications   Secondary hypercoagulable state ([place]) [D68.69] DVT HX OF (Resolved) [Z86.718] Long term (current) use of anticoagulants [Z79.01]        Comments:          Anticoagulation Care Providers     Provider Role Specialty Phone number   Burman Freestone, MD  Internal Medicine [phone]       Allergies  Allergen Reactions   Ace Inhibitors Cough    Current Outpatient Medications:    colestipol (COLESTID) 1 g tablet, Take up to 6 tablets by mouth as needed no more than once daily for chronic biliary diarrhea, Disp: 180 tablet, Rfl: 3   denosumab (PROLIA) 60 MG/ML SOSY injection, Inject 60 mg into the skin every 6 (six) months., Disp: , Rfl:    levothyroxine (SYNTHROID) 88 MCG tablet, Take 1 tablet (88 mcg total) by mouth daily before breakfast., Disp: 90 tablet, Rfl: 3   losartan (COZAAR) 25 MG tablet, Take 1 tablet (25 mg total) by mouth daily., Disp: 90 tablet, Rfl: 3   Multiple Vitamins-Minerals (ALGAE BASED CALCIUM) TABS, Take 500 mg by mouth  daily., Disp: , Rfl:    pantoprazole (PROTONIX) 20 MG tablet, Take 1 tablet (20 mg total) by mouth daily., Disp: 90 tablet, Rfl: 3   warfarin (COUMADIN) 2 MG tablet, Take 1 tablet (2 mg total) by mouth daily., Disp: 30 tablet, Rfl: 11   colchicine 0.6 MG tablet, Take 1 tablet by mouth twice a day for gout flare ups (Patient not taking: Reported on 01/26/2022), Disp: 20 tablet, Rfl: 1   COVID-19 mRNA bivalent vaccine, Pfizer, (PFIZER COVID-19 VAC BIVALENT) injection, Inject into the muscle. (Patient not taking: Reported on 01/26/2022), Disp: 0.3 mL, Rfl: 0   predniSONE (DELTASONE) 10 MG tablet, Take 6 tabets by mouth for 4 days and then take 4 tablets by mouth for 4 days and then 2 tablets by mouth for 4 days. Drink plenty of water with this mediciine. (Patient not taking: Reported on 01/26/2022), Disp: 48 tablet, Rfl: 0   Zoster Vaccine Adjuvanted [facility]) injection, Inject 0.5 mLs into the muscle. (Patient not taking: Reported on 01/26/2022), Disp: 0.5 mL, Rfl: 1 Past Medical History:  Diagnosis Date   Anemia    Anemia requiring transfusions 06/03/2006   Required post operative transfusion 01/2020. Mixed anemia with hx of macrocytic B12 deficiency anemia.    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 Percell Miller with [facility] is following.   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       Current Social History 10/18/2019        Patient lives alone in a 2 level home where laundry is in the basement. There is one step without handrail up to the entrance patient uses.      Patient's method of transportation is personal car.      The highest level of education was advanced degree; Master's in Education.      The patient currently retired.      Identified important Relationships are My daughter, good friend, Manuela Schwartz who is starting with dementia. Several women from church and [place].      Pets : None       Interests / Fun: Read a lot, take walks, [employer] with church groups and [place] groups.       Current Stressors: "Cancer. And unable to get knee replacement last year 2/2 Covid pandemic and this year 2/2 Cancer. Hair is starting to fall out 2/2 chemo"      Religious / Personal Beliefs: "Episcopalian, Christian. I believe in God and [place]."       L. Ducatte, BSN, RN-BC       Social Determinants of Health   Financial Resource Strain: Not on file  Food Insecurity: Not on file  Transportation Needs: Not on file  Physical  Activity: Not on file  Stress: Not on file  Social Connections: Not on file   Family History  Problem Relation Age of Onset   Cancer Mother        H&N, smoker   Liver disease Mother    Cancer Sister        lung, 2011, smoker   Other Other        grandmother had mastectomy in her 50's unknown reason  Macular degeneration Father    Diverticulitis Father    Breast cancer Neg Hx     ASSESSMENT Recent Results: The most recent result is correlated with 16 mg per week: Lab Results  Component Value Date   INR 1.4 (A) 02/09/2022   INR 1.6 (A) 02/02/2022   INR 1.2 12/10/2020    Anticoagulation Dosing: Description   Take one-and-one-half (1 & 1/2) of your '2mg'$  strength warfarin tablets, Monday through Friday. On Saturday and Sunday,k take only one (1) tablet on these two days. Return to clinic on Monday February 16, 2022 at 9:00 am.     INR today: Subtherapeutic  PLAN Weekly dose was increased by 19% to 19 mg per week  Patient Instructions  Patient instructed to take medications as defined in the Anti-coagulation Track section of this encounter.  Patient instructed to take today's dose.  Patient instructed to take one-and-one-half (1 & 1/2) of your '2mg'$  strength warfarin tablets, Monday through Friday. On Saturday and Sunday,k take only one (1) tablet on these two days. Return to clinic on Monday February 16, 2022 at 9:00 am. Patient verbalized understanding of these instructions.   Patient advised to contact clinic or seek medical attention if signs/symptoms of bleeding or thromboembolism occur.  Patient verbalized understanding by repeating back information and was advised to contact me if further medication-related questions arise. Patient was also provided an information handout.  Follow-up Return in 1 week (on 02/16/2022) for Follow up INR.  Pennie Banter, PharmD, CPP  15 minutes spent face-to-face with the patient during the encounter. 50% of time spent on education,  including signs/sx bleeding and clotting, as well as food and drug interactions with warfarin. 50% of time was spent on fingerprick POC INR sample collection,processing, results determination, and documentation in [email or web address].kim.net/.

## 2022-02-16 ENCOUNTER — Other Ambulatory Visit (HOSPITAL_COMMUNITY): Payer: Self-pay

## 2022-02-16 ENCOUNTER — Other Ambulatory Visit: Payer: Self-pay | Admitting: Pharmacist

## 2022-02-16 ENCOUNTER — Ambulatory Visit (INDEPENDENT_AMBULATORY_CARE_PROVIDER_SITE_OTHER): Payer: Medicare Other | Admitting: Pharmacist

## 2022-02-16 DIAGNOSIS — D6869 Other thrombophilia: Secondary | ICD-10-CM

## 2022-02-16 DIAGNOSIS — Z7901 Long term (current) use of anticoagulants: Secondary | ICD-10-CM

## 2022-02-16 LAB — POCT INR: INR: 1.7 — AB (ref 2.0–3.0)

## 2022-02-16 MED ORDER — WARFARIN SODIUM 2 MG PO TABS
ORAL_TABLET | ORAL | 3 refills | Status: DC
  Filled 2022-02-16: qty 50, 30d supply, fill #0
  Filled 2022-03-18: qty 50, 30d supply, fill #1
  Filled 2022-04-23: qty 50, 30d supply, fill #2
  Filled 2022-05-24: qty 50, 30d supply, fill #3

## 2022-02-16 NOTE — Patient Instructions (Signed)
Patient instructed to take medications as defined in the Anti-coagulation Track section of this encounter.  Patient instructed to take today's dose.  Patient instructed to take  two (2) tablets on Mondays and Thursdays. On all other days, take only one-and-one-half (1 & 1/2) tablets of your '2mg'$  strength, lavender colored warfarin tablets.  Patient verbalized understanding of these instructions.

## 2022-02-16 NOTE — Progress Notes (Signed)
Anticoagulation Management Pamela [name] is a 76 y.o. female who reports to the clinic for monitoring of warfarin treatment.    Indication:  Secondary hypercoagulable state, long term current use of oral anticoagulant, warfarin. Target INR 2.0 - 3.0.   Duration: indefinite Supervising physician: [place] [facility] Visit History: Patient does not report signs/symptoms of bleeding or thromboembolism  Other recent changes: No diet, medications, lifestyle changes.  Anticoagulation Episode Summary     Current INR goal:  2.0-3.0  TTR:  61.9 % (11.1 y)  Next INR check:  02/26/2022  INR from last check:  1.7 (02/16/2022)  Weekly max warfarin dose:    Target end date:  Indefinite  INR check location:  Anticoagulation Clinic  Preferred lab:    Send INR reminders to:  [facility]   Indications   Secondary hypercoagulable state ([place]) [D68.69] DVT HX OF (Resolved) [Z86.718] Long term (current) use of anticoagulants [Z79.01]        Comments:          Anticoagulation Care Providers     Provider Role Specialty Phone number   Burman Freestone, MD  Internal Medicine [phone]       Allergies  Allergen Reactions   Ace Inhibitors Cough    Current Outpatient Medications:    colestipol (COLESTID) 1 g tablet, Take up to 6 tablets by mouth as needed no more than once daily for chronic biliary diarrhea, Disp: 180 tablet, Rfl: 3   denosumab (PROLIA) 60 MG/ML SOSY injection, Inject 60 mg into the skin every 6 (six) months., Disp: , Rfl:    levothyroxine (SYNTHROID) 88 MCG tablet, Take 1 tablet (88 mcg total) by mouth daily before breakfast., Disp: 90 tablet, Rfl: 3   losartan (COZAAR) 25 MG tablet, Take 1 tablet (25 mg total) by mouth daily., Disp: 90 tablet, Rfl: 3   Multiple Vitamins-Minerals (ALGAE BASED CALCIUM) TABS, Take 500 mg by mouth daily., Disp: , Rfl:    pantoprazole (PROTONIX) 20 MG tablet, Take 1 tablet (20 mg total) by mouth daily., Disp: 90  tablet, Rfl: 3   warfarin (COUMADIN) 2 MG tablet, Take 1 tablet (2 mg total) by mouth daily., Disp: 30 tablet, Rfl: 11   colchicine 0.6 MG tablet, Take 1 tablet by mouth twice a day for gout flare ups (Patient not taking: Reported on 01/26/2022), Disp: 20 tablet, Rfl: 1   COVID-19 mRNA bivalent vaccine, Pfizer, (PFIZER COVID-19 VAC BIVALENT) injection, Inject into the muscle. (Patient not taking: Reported on 02/16/2022), Disp: 0.3 mL, Rfl: 0   predniSONE (DELTASONE) 10 MG tablet, Take 6 tabets by mouth for 4 days and then take 4 tablets by mouth for 4 days and then 2 tablets by mouth for 4 days. Drink plenty of water with this mediciine. (Patient not taking: Reported on 01/26/2022), Disp: 48 tablet, Rfl: 0   Zoster Vaccine Adjuvanted [facility]) injection, Inject 0.5 mLs into the muscle. (Patient not taking: Reported on 01/26/2022), Disp: 0.5 mL, Rfl: 1 Past Medical History:  Diagnosis Date   Anemia    Anemia requiring transfusions 06/03/2006   Required post operative transfusion 01/2020. Mixed anemia with hx of macrocytic B12 deficiency anemia.    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 Percell Miller with [facility] is following.   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       Current Social History 10/18/2019        Patient lives alone in a 2 level home where laundry is in the basement. There is one step without handrail up to the entrance patient uses.      Patient's method of transportation is personal car.      The highest level of education was advanced degree; Master's in Education.      The patient currently retired.      Identified important Relationships are My daughter, good friend, Manuela Schwartz who is starting with dementia. Several women from church and [place].      Pets : None       Interests / Fun: Read a lot, take walks, [employer] with church groups and [place] groups.       Current Stressors: "Cancer. And unable to get knee replacement last year 2/2 Covid pandemic and this year 2/2 Cancer. Hair is starting to fall out 2/2 chemo"      Religious / Personal Beliefs: "Episcopalian, Christian. I believe in God and [place]."       L. Ducatte, BSN, RN-BC       Social Determinants of Health   Financial Resource Strain: Not on file  Food Insecurity: Not on file  Transportation Needs: Not on file  Physical Activity: Not on file  Stress: Not on file  Social Connections: Not on file   Family History  Problem Relation Age of  Onset   Cancer Mother        H&N, smoker   Liver disease Mother    Cancer Sister        lung, 2011, smoker   Other Other        grandmother had mastectomy in her 96's unknown reason   Macular degeneration Father    Diverticulitis Father    Breast cancer Neg Hx  ASSESSMENT Recent Results: The most recent result is correlated with 19 mg per week: Lab Results  Component Value Date   INR 1.7 (A) 02/16/2022   INR 1.4 (A) 02/09/2022   INR 1.6 (A) 02/02/2022    Anticoagulation Dosing: Description   Take two (2) tablets on Mondays and Thursdays. On all other days, take only one-and-one-half (1 & 1/2) tablets of your '2mg'$  strength, lavender colored warfarin tablets.      INR today: Subtherapeutic  PLAN Weekly dose was increased by 21% to 23 mg per week.   Patient Instructions  Patient instructed to take medications as defined in the Anti-coagulation Track section of this encounter.  Patient instructed to take today's dose.  Patient instructed to take  two (2) tablets on Mondays and Thursdays. On all other days, take only one-and-one-half (1 & 1/2) tablets of your '2mg'$  strength, lavender colored warfarin tablets.  Patient verbalized understanding of these instructions.   Patient advised to contact clinic or seek medical attention if signs/symptoms of bleeding or thromboembolism occur.  Patient verbalized understanding by repeating back information and was advised to contact me if further medication-related questions arise. Patient was also provided an information handout.  Follow-up Return in 10 days (on 02/26/2022) for Follow up INR.  Pennie Banter, PharmD, CPP  15 minutes spent face-to-face with the patient during the encounter. 50% of time spent on education, including signs/sx bleeding and clotting, as well as food and drug interactions with warfarin. 50% of time was spent on fingerprick POC INR sample collection,processing, results determination, and documentation in  http://www.kim.net/.

## 2022-02-16 NOTE — Addendum Note (Signed)
Addended by: [name] B on: 02/16/2022 02:25 PM   Modules accepted: Orders

## 2022-02-20 ENCOUNTER — Other Ambulatory Visit: Payer: Self-pay

## 2022-02-25 ENCOUNTER — Other Ambulatory Visit: Payer: Medicare Other

## 2022-02-25 ENCOUNTER — Ambulatory Visit: Payer: Medicare Other | Admitting: Oncology

## 2022-02-26 ENCOUNTER — Ambulatory Visit (INDEPENDENT_AMBULATORY_CARE_PROVIDER_SITE_OTHER): Payer: Medicare Other | Admitting: Pharmacist

## 2022-02-26 DIAGNOSIS — Z7901 Long term (current) use of anticoagulants: Secondary | ICD-10-CM

## 2022-02-26 DIAGNOSIS — D6869 Other thrombophilia: Secondary | ICD-10-CM

## 2022-02-26 LAB — POCT INR: INR: 3.3 — AB (ref 2.0–3.0)

## 2022-02-26 NOTE — Progress Notes (Signed)
INTERNAL MEDICINE TEACHING ATTENDING ADDENDUM - Caytlyn Evers M.D  Duration- indefinite, Indication- Recurrent DVT, Factor II mutation, INR- supratherapeutic. Agree with pharmacy recommendations as outlined in their note.

## 2022-02-26 NOTE — Patient Instructions (Signed)
Patient instructed to take medications as defined in the Anti-coagulation Track section of this encounter.  Patient instructed to take today's dose.  Patient instructed to take one-and-one-half (1 & 1/2) tablets of your '2mg'$  strength, lavender colored warfarin tablets.  Patient verbalized understanding of these instructions.

## 2022-02-26 NOTE — Progress Notes (Signed)
Anticoagulation Management Please cosign and attest this encounter. Thank you.  Pamela [name] is a 76 y.o. female who reports to the clinic for monitoring of warfarin treatment.    Indication:  Secondary hypercoagulable state; long term current use of oral anticoagulant, warfarin. Target INR 2.0 - 3.0.   Duration: indefinite Supervising physician: Aldine Contes, MD  Anticoagulation Clinic Visit History: Patient does not report signs/symptoms of bleeding or thromboembolism  Other recent changes: No diet, medications, lifestyle changes identified. Anticoagulation Episode Summary     Current INR goal:  2.0-3.0  TTR:  61.9 % (11.2 y)  Next INR check:  03/12/2022  INR from last check:  3.3 (02/26/2022)  Weekly max warfarin dose:    Target end date:  Indefinite  INR check location:  Anticoagulation Clinic  Preferred lab:    Send INR reminders to:  [facility]   Indications   Secondary hypercoagulable state ([place]) [D68.69] DVT HX OF (Resolved) [Z86.718] Long term (current) use of anticoagulants [Z79.01]        Comments:          Anticoagulation Care Providers     Provider Role Specialty Phone number   Burman Freestone, MD  Internal Medicine [phone]       Allergies  Allergen Reactions   Ace Inhibitors Cough    Current Outpatient Medications:    colestipol (COLESTID) 1 g tablet, Take up to 6 tablets by mouth as needed no more than once daily for chronic biliary diarrhea, Disp: 180 tablet, Rfl: 3   denosumab (PROLIA) 60 MG/ML SOSY injection, Inject 60 mg into the skin every 6 (six) months., Disp: , Rfl:    levothyroxine (SYNTHROID) 88 MCG tablet, Take 1 tablet (88 mcg total) by mouth daily before breakfast., Disp: 90 tablet, Rfl: 3   losartan (COZAAR) 25 MG tablet, Take 1 tablet (25 mg total) by mouth daily., Disp: 90 tablet, Rfl: 3   Multiple Vitamins-Minerals (ALGAE BASED CALCIUM) TABS, Take 500 mg by mouth daily., Disp: , Rfl:    pantoprazole (PROTONIX)  20 MG tablet, Take 1 tablet (20 mg total) by mouth daily., Disp: 90 tablet, Rfl: 3   warfarin (COUMADIN) 2 MG tablet, Take two (2) tablets on Mondays and Thursdays. All other days, Sundays,Tuesdays, Wednesdays, Fridays and Saturdays, take only one-and-one-half (1 & 1/2) tablets., Disp: 50 tablet, Rfl: 3   colchicine 0.6 MG tablet, Take 1 tablet by mouth twice a day for gout flare ups (Patient not taking: Reported on 01/26/2022), Disp: 20 tablet, Rfl: 1   COVID-19 mRNA bivalent vaccine, Pfizer, (PFIZER COVID-19 VAC BIVALENT) injection, Inject into the muscle. (Patient not taking: Reported on 02/16/2022), Disp: 0.3 mL, Rfl: 0   predniSONE (DELTASONE) 10 MG tablet, Take 6 tabets by mouth for 4 days and then take 4 tablets by mouth for 4 days and then 2 tablets by mouth for 4 days. Drink plenty of water with this mediciine. (Patient not taking: Reported on 01/26/2022), Disp: 48 tablet, Rfl: 0   Zoster Vaccine Adjuvanted [facility]) injection, Inject 0.5 mLs into the muscle. (Patient not taking: Reported on 01/26/2022), Disp: 0.5 mL, Rfl: 1 Past Medical History:  Diagnosis Date   Anemia    Anemia requiring transfusions 06/03/2006   Required post operative transfusion 01/2020. Mixed anemia with hx of macrocytic B12 deficiency anemia.    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 Percell Miller with [facility] is following.   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       Current Social History 10/18/2019        Patient lives alone in a 2 level home where laundry is in the basement. There is one step without handrail up to the entrance patient uses.      Patient's method of transportation is personal car.      The highest level of education was advanced degree; Master's in Education.      The patient currently retired.      Identified important Relationships are My daughter, good friend, Manuela Schwartz who is starting with dementia. Several women from church and [place].      Pets : None       Interests / Fun: Read a lot, take walks, [employer] with church groups and [place] groups.       Current Stressors: "Cancer. And unable to get knee replacement last year 2/2 Covid pandemic and this year 2/2 Cancer. Hair is starting to fall out 2/2 chemo"      Religious / Personal Beliefs: "Episcopalian, Christian. I believe in God and [place]."       L. Ducatte, BSN, RN-BC       Social Determinants of Health   Financial Resource Strain: Not on file  Food Insecurity:  Not on file  Transportation Needs: Not on file  Physical Activity: Not on file  Stress: Not on file  Social Connections: Not on file   Family History  Problem Relation Age of Onset   Cancer Mother        H&N, smoker   Liver disease Mother    Cancer Sister        lung, 2011, smoker   Other Other        grandmother had mastectomy  in her 68's unknown reason   Macular degeneration Father    Diverticulitis Father    Breast cancer Neg Hx     ASSESSMENT Recent Results: The most recent result is correlated with 23 mg per week: Lab Results  Component Value Date   INR 3.3 (A) 02/26/2022   INR 1.7 (A) 02/16/2022   INR 1.4 (A) 02/09/2022    Anticoagulation Dosing: Description   Take one-and-one-half (1 & 1/2) tablets of your '2mg'$  strength, lavender colored warfarin tablets.      INR today: Supratherapeutic  PLAN Weekly dose was decreased by 8% to 21 mg per week  Patient Instructions  Patient instructed to take medications as defined in the Anti-coagulation Track section of this encounter.  Patient instructed to take today's dose.  Patient instructed to take one-and-one-half (1 & 1/2) tablets of your '2mg'$  strength, lavender colored warfarin tablets.  Patient verbalized understanding of these instructions.   Patient advised to contact clinic or seek medical attention if signs/symptoms of bleeding or thromboembolism occur.  Patient verbalized understanding by repeating back information and was advised to contact me if further medication-related questions arise. Patient was also provided an information handout.  Follow-up Return in 2 weeks (on 03/12/2022) for Follow up INR.  Pennie Banter, PharmD, CPP  15 minutes spent face-to-face with the patient during the encounter. 50% of time spent on education, including signs/sx bleeding and clotting, as well as food and drug interactions with warfarin. 50% of time was spent on fingerprick POC INR sample collection,processing, results  determination, and documentation in [email or web address]/.

## 2022-03-04 ENCOUNTER — Other Ambulatory Visit (HOSPITAL_COMMUNITY): Payer: Self-pay | Admitting: Urology

## 2022-03-04 ENCOUNTER — Ambulatory Visit (HOSPITAL_COMMUNITY)
Admission: RE | Admit: 2022-03-04 | Discharge: 2022-03-04 | Disposition: A | Discharge status: Home / Self Care | Payer: Medicare Other | Source: Ambulatory Visit | Attending: Urology | Admitting: Urology

## 2022-03-04 DIAGNOSIS — C674 Malignant neoplasm of posterior wall of bladder: Secondary | ICD-10-CM | POA: Diagnosis present

## 2022-03-12 ENCOUNTER — Inpatient Hospital Stay: Payer: Medicare Other | Attending: Oncology | Admitting: Oncology

## 2022-03-12 ENCOUNTER — Other Ambulatory Visit: Payer: Medicare Other

## 2022-03-12 ENCOUNTER — Other Ambulatory Visit: Payer: Self-pay

## 2022-03-12 VITALS — BP 136/78 | HR 71 | Temp 97.9°F | Resp 15 | Wt 169.3 lb

## 2022-03-12 DIAGNOSIS — D649 Anemia, unspecified: Secondary | ICD-10-CM | POA: Diagnosis not present

## 2022-03-12 DIAGNOSIS — Z7901 Long term (current) use of anticoagulants: Secondary | ICD-10-CM | POA: Diagnosis not present

## 2022-03-12 DIAGNOSIS — C689 Malignant neoplasm of urinary organ, unspecified: Secondary | ICD-10-CM | POA: Diagnosis not present

## 2022-03-12 DIAGNOSIS — Z86718 Personal history of other venous thrombosis and embolism: Secondary | ICD-10-CM | POA: Insufficient documentation

## 2022-03-12 DIAGNOSIS — Z8551 Personal history of malignant neoplasm of bladder: Secondary | ICD-10-CM | POA: Diagnosis present

## 2022-03-12 DIAGNOSIS — D6852 Prothrombin gene mutation: Secondary | ICD-10-CM | POA: Diagnosis not present

## 2022-03-12 NOTE — Progress Notes (Signed)
Hematology and Oncology Follow Up Visit  Pamela [name] [id number] 1946/04/05 76 y.o. 03/12/2022 8:14 AM Pamela [name], MDWilliams, [name], MD   Principle Diagnosis: 76 year old with bladder cancer diagnosed in 2021.  She was found to have T2N0 high-grade urothelial carcinoma with small cell features.    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  Neoadjuvant chemotherapy with gemcitabine and cisplatin started on March 24th 2021.   She completed 3 cycles of therapy on Nov 14, 2019.  She is status post robotic assisted laparoscopic cystectomy and lymphadenectomy completed on January 17, 2020.  She had a complete response to therapy with no residual disease on surgery.  Current therapy: Active surveillance.  Interim History: [name] returns today for a follow-up visit.  Since last visit, she reports feeling well without any major complaints.  She denies any nausea, vomiting or abdominal pain.  She denies any hospitalizations or illnesses.    Medications: Reviewed without changes. Current Outpatient Medications  Medication Sig Dispense Refill   colchicine 0.6 MG tablet Take 1 tablet by mouth twice a day for gout flare ups (Patient not taking: Reported on 01/26/2022) 20 tablet 1   colestipol (COLESTID) 1 g tablet Take up to 6 tablets by mouth as needed no more than once daily for chronic biliary diarrhea 180 tablet 3   COVID-19 mRNA bivalent vaccine, Pfizer, (PFIZER COVID-19 VAC BIVALENT) injection Inject into the muscle. (Patient not taking: Reported on 02/16/2022) 0.3 mL 0   denosumab (PROLIA) 60 MG/ML SOSY injection Inject 60 mg into the skin every 6 (six) months.     levothyroxine (SYNTHROID) 88 MCG tablet Take 1 tablet (88 mcg total) by mouth daily before breakfast. 90 tablet 3   losartan (COZAAR) 25 MG tablet Take 1 tablet (25 mg total) by mouth daily. 90 tablet 3   Multiple  Vitamins-Minerals (ALGAE BASED CALCIUM) TABS Take 500 mg by mouth daily.     pantoprazole (PROTONIX) 20 MG tablet Take 1 tablet (20 mg total) by mouth daily. 90 tablet 3   predniSONE (DELTASONE) 10 MG tablet Take 6 tabets by mouth for 4 days and then take 4 tablets by mouth for 4 days and then 2 tablets by mouth for 4 days. Drink plenty of water with this mediciine. (Patient not taking: Reported on 01/26/2022) 48 tablet 0   warfarin (COUMADIN) 2 MG tablet Take two (2) tablets on Mondays and Thursdays. All other days, Sundays,Tuesdays, Wednesdays, Fridays and Saturdays, take only one-and-one-half (1 & 1/2) tablets. 50 tablet 3   Zoster Vaccine Adjuvanted [facility]) injection Inject 0.5 mLs into the muscle. (Patient not taking: Reported on 01/26/2022) 0.5 mL 1   No current facility-administered medications for this visit.     Allergies:  Allergies  Allergen Reactions   Ace Inhibitors Cough      Physical Exam:    Blood pressure 136/78, pulse 71, temperature 97.9 F (36.6 C), temperature source Oral, resp. rate 15, weight 169 lb 4.8 oz (76.8 kg), SpO2 95 %.      ECOG: 1   General appearance: Comfortable appearing without any discomfort Head: Normocephalic without any trauma Oropharynx: Mucous membranes are moist and pink without any thrush or ulcers. Eyes: Pupils are equal and round reactive to light. Lymph nodes: No cervical, supraclavicular, inguinal or axillary lymphadenopathy.   Heart:regular rate and rhythm.  S1 and S2 without leg edema. Lung: Clear without any rhonchi or wheezes.  No  dullness to percussion. Abdomin: Soft, nontender, nondistended with good bowel sounds.  No hepatosplenomegaly. Musculoskeletal: No joint deformity or effusion.  Full range of motion noted. Neurological: No deficits noted on motor, sensory and deep tendon reflex exam. Skin: No petechial rash or dryness.  Appeared moist.         Lab Results: Lab Results  Component Value Date   WBC 7.3  08/27/2021   HGB 11.8 (L) 08/27/2021   HCT 35.2 (L) 08/27/2021   MCV 99.7 08/27/2021   PLT 248 08/27/2021     Chemistry      Component Value Date/Time   NA 138 08/27/2021 1007   NA 143 06/13/2019 1520   K 5.0 08/27/2021 1007   CL 106 08/27/2021 1007   CO2 26 08/27/2021 1007   BUN 24 (H) 08/27/2021 1007   BUN 15 06/13/2019 1520   CREATININE 1.23 (H) 08/27/2021 1007   CREATININE 0.78 12/13/2014 0954      Component Value Date/Time   CALCIUM 9.3 08/27/2021 1007   ALKPHOS 47 08/27/2021 1007   AST 17 08/27/2021 1007   ALT 10 08/27/2021 1007   BILITOT 0.8 08/27/2021 1007          Impression and Plan:   76 year old woman with:   1.  Bladder cancer diagnosed in 2021.  She was found to have T2N0 urothelial carcinoma with small cell features and achieved a complete response to chemotherapy and radical cystectomy.  Imaging studies completed on March 04, 2022 were personally reviewed and discussed with the patient.  She has no evidence of disease relapse based on CT scan of the abdomen and pelvis and a chest x-ray.  At this time I recommended continued active surveillance and repeat evaluation in 1 year.  Treatment upon relapse disease will be recommended including immunotherapy, antibody drug conjugate among others.  These will be deferred unless she has relapsed disease.   2.  History of recurrent thrombosis related to prothrombin gene mutation: She is to continue anticoagulation indefinitely.  3.  Age-appropriate cancer screening: She is up-to-date with mammography to be repeated in January 2024.   4.  Anemia: Follow-up as the consequences of her previous chemotherapy.  Her most recent hemoglobin August 2 was 11.9.   5.  Follow-up: In 1 year for a follow-up.   30  minutes were spent on this encounter.  The time was dedicated to reviewing laboratory data, imaging studies and treatment choices and future plan of care discussion.        Zola Button, MD 9/7/20238:14 AM

## 2022-03-16 ENCOUNTER — Ambulatory Visit (INDEPENDENT_AMBULATORY_CARE_PROVIDER_SITE_OTHER): Payer: Medicare Other | Admitting: Pharmacist

## 2022-03-16 DIAGNOSIS — Z7901 Long term (current) use of anticoagulants: Secondary | ICD-10-CM | POA: Diagnosis not present

## 2022-03-16 DIAGNOSIS — D6869 Other thrombophilia: Secondary | ICD-10-CM

## 2022-03-16 LAB — POCT INR: INR: 2.9 (ref 2.0–3.0)

## 2022-03-16 NOTE — Progress Notes (Signed)
Anticoagulation Management Pamela [name] is a 76 y.o. female who reports to the clinic for monitoring of warfarin treatment.    Indication: Hypercoagulable state, wished to come off her DOAC citing high costs. Long term current use of oral anticoagulant, warfarin to maintain INR 2.0  - 3.0.  Duration: indefinite Supervising physician: [place] [facility] Visit History: Patient does not report signs/symptoms of bleeding or thromboembolism  Other recent changes: No diet, medications, lifestyle changes reported.  Anticoagulation Episode Summary     Current INR goal:  2.0-3.0  TTR:  61.7 % (11.2 y)  Next INR check:  04/06/2022  INR from last check:  2.9 (03/16/2022)  Weekly max warfarin dose:    Target end date:  Indefinite  INR check location:  Anticoagulation Clinic  Preferred lab:    Send INR reminders to:  ANTICOAG [facility]   Indications   Secondary hypercoagulable state ([place]) [D68.69] DVT HX OF (Resolved) [Z86.718] Long term (current) use of anticoagulants [Z79.01]        Comments:          Anticoagulation Care Providers     Provider Role Specialty Phone number   Burman Freestone, MD  Internal Medicine [phone]       Allergies  Allergen Reactions   Ace Inhibitors Cough    Current Outpatient Medications:    colestipol (COLESTID) 1 g tablet, Take up to 6 tablets by mouth as needed no more than once daily for chronic biliary diarrhea, Disp: 180 tablet, Rfl: 3   denosumab (PROLIA) 60 MG/ML SOSY injection, Inject 60 mg into the skin every 6 (six) months., Disp: , Rfl:    levothyroxine (SYNTHROID) 88 MCG tablet, Take 1 tablet (88 mcg total) by mouth daily before breakfast., Disp: 90 tablet, Rfl: 3   losartan (COZAAR) 25 MG tablet, Take 1 tablet (25 mg total) by mouth daily., Disp: 90 tablet, Rfl: 3   Multiple Vitamins-Minerals (ALGAE BASED CALCIUM) TABS, Take 500 mg by mouth daily., Disp: , Rfl:    pantoprazole (PROTONIX) 20 MG tablet, Take 1  tablet (20 mg total) by mouth daily., Disp: 90 tablet, Rfl: 3   warfarin (COUMADIN) 2 MG tablet, Take two (2) tablets on Mondays and Thursdays. All other days, Sundays,Tuesdays, Wednesdays, Fridays and Saturdays, take only one-and-one-half (1 & 1/2) tablets., Disp: 50 tablet, Rfl: 3   colchicine 0.6 MG tablet, Take 1 tablet by mouth twice a day for gout flare ups (Patient not taking: Reported on 01/26/2022), Disp: 20 tablet, Rfl: 1   COVID-19 mRNA bivalent vaccine, Pfizer, (PFIZER COVID-19 VAC BIVALENT) injection, Inject into the muscle. (Patient not taking: Reported on 02/16/2022), Disp: 0.3 mL, Rfl: 0   predniSONE (DELTASONE) 10 MG tablet, Take 6 tabets by mouth for 4 days and then take 4 tablets by mouth for 4 days and then 2 tablets by mouth for 4 days. Drink plenty of water with this mediciine. (Patient not taking: Reported on 01/26/2022), Disp: 48 tablet, Rfl: 0   Zoster Vaccine Adjuvanted [facility]) injection, Inject 0.5 mLs into the muscle. (Patient not taking: Reported on 01/26/2022), Disp: 0.5 mL, Rfl: 1 Past Medical History:  Diagnosis Date   Anemia    Anemia requiring transfusions 06/03/2006   Required post operative transfusion 01/2020. Mixed anemia with hx of macrocytic B12 deficiency anemia.    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 Percell Miller with [facility] is following.   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       Current Social History 10/18/2019        Patient lives alone in a 2 level home where laundry is in the basement. There is one step without handrail up to the entrance patient uses.      Patient's method of transportation is personal car.      The highest level of education was advanced degree; Master's in Education.      The patient currently retired.      Identified important Relationships are My daughter, good friend, Manuela Schwartz who is starting with dementia. Several women from church and [place].      Pets : None       Interests / Fun: Read a lot, take walks, [employer] with church groups and [place] groups.       Current Stressors: "Cancer. And unable to get knee replacement last year 2/2 Covid pandemic and this year 2/2 Cancer. Hair is starting to fall out 2/2 chemo"      Religious / Personal Beliefs: "Episcopalian, Christian. I believe in God and [place]."       L. Ducatte, BSN, RN-BC       Social Determinants of Health   Financial Resource Strain: Not on file  Food Insecurity: Not on file   Transportation Needs: Not on file  Physical Activity: Not on file  Stress: Not on file  Social Connections: Not on file   Family History  Problem Relation Age of Onset   Cancer Mother        H&N, smoker   Liver disease Mother    Cancer Sister        lung, 2011, smoker   Other Other        grandmother had mastectomy in  her 48's unknown reason   Macular degeneration Father    Diverticulitis Father    Breast cancer Neg Hx     ASSESSMENT Recent Results: The most recent result is correlated with 21 mg per week: Lab Results  Component Value Date   INR 2.9 03/16/2022   INR 3.3 (A) 02/26/2022   INR 1.7 (A) 02/16/2022    Anticoagulation Dosing: Description   Take one-and-one-half (1 & 1/2) tablets of your '2mg'$  strength, lavender colored warfarin tablets.      INR today: Therapeutic  PLAN Weekly dose was unchanged. Will continue to take one-and-one-half (1 & 1/2 ) of her '2mg'$  strength warfarin tablets for a daily dose of '3mg'$ .    Patient Instructions  Patient instructed to take medications as defined in the Anti-coagulation Track section of this encounter.  Patient instructed to take today's dose.  Patient instructed to take one and one-half (1 & 1/2) of your '2mg'$  strength warfarin tablets (daily dose '3mg'$ ) daily.  Patient verbalized understanding of these instructions.   Patient advised to contact clinic or seek medical attention if signs/symptoms of bleeding or thromboembolism occur.  Patient verbalized understanding by repeating back information and was advised to contact me if further medication-related questions arise. Patient was also provided an information handout.  Follow-up Return in 3 weeks (on 04/06/2022) for Follow up INR.  Pennie Banter, PharmD, CPP  15 minutes spent face-to-face with the patient during the encounter. 50% of time spent on education, including signs/sx bleeding and clotting, as well as food and drug interactions with warfarin. 50% of time was spent  on fingerprick POC INR sample collection,processing, results determination, and documentation in [email or web address]/.

## 2022-03-16 NOTE — Patient Instructions (Signed)
Patient instructed to take medications as defined in the Anti-coagulation Track section of this encounter.  Patient instructed to take today's dose.  Patient instructed to take one and one-half (1 & 1/2) of your '2mg'$  strength warfarin tablets (daily dose '3mg'$ ) daily.  Patient verbalized understanding of these instructions.

## 2022-03-18 ENCOUNTER — Other Ambulatory Visit (HOSPITAL_COMMUNITY): Payer: Self-pay

## 2022-03-24 ENCOUNTER — Ambulatory Visit: Payer: Self-pay | Admitting: Licensed Clinical Social Worker

## 2022-03-24 NOTE — Patient Instructions (Signed)
Visit Information  Thank you for taking time to visit with me today. Please don't hesitate to contact me if I can be of assistance to you.   Following are the goals we discussed today:   Goals Addressed               This Visit's Progress     Care Coordination Activities (pt-stated)        Care Coordination Interventions: Reviewed scheduled/upcoming provider appointments including . Discussed plans with patient for ongoing care management follow up and provided patient with direct contact information for care management team Assessed social determinant of health barriers  Patient declined needing further assistance.          Please call the care guide team at [phone] if you need to cancel or reschedule your appointment.    Patient verbalizes understanding of instructions and care plan provided today and agrees to view in [place]. Active MyChart status and patient understanding of how to access instructions and care plan via MyChart confirmed with patient.     No further follow up required: .  Lenor Derrick , MSW Social Worker [facility] Care Management  [phone]

## 2022-03-24 NOTE — Patient Outreach (Signed)
  Care Coordination   Initial Visit Note   03/24/2022 Name: Pamela [name] MRN: [id number] DOB: May 13, 1946  Pamela [name] Pamela [name] is a 76 y.o. year old female who sees Pamela [name], Pamela Pattee, MD for primary care. I spoke with  Pamela [name] by phone today.  What matters to the patients health and wellness today?  No further assistance needed.    Goals Addressed               This Visit's Progress     Care Coordination Activities (pt-stated)        Care Coordination Interventions: Reviewed scheduled/upcoming provider appointments including . Discussed plans with patient for ongoing care management follow up and provided patient with direct contact information for care management team Assessed social determinant of health barriers  Patient declined needing further assistance.        SDOH assessments and interventions completed:  Yes     Care Coordination Interventions Activated:  Yes  Care Coordination Interventions:  Yes, provided   Follow up plan: No further intervention required.   Encounter Outcome:  Pt. Visit Completed   Lenor Derrick, MSW  Social Worker [facility] Care Management  [phone]

## 2022-04-01 ENCOUNTER — Encounter: Payer: Self-pay | Admitting: Oncology

## 2022-04-06 ENCOUNTER — Ambulatory Visit (INDEPENDENT_AMBULATORY_CARE_PROVIDER_SITE_OTHER): Payer: Medicare Other | Admitting: Pharmacist

## 2022-04-06 DIAGNOSIS — D6869 Other thrombophilia: Secondary | ICD-10-CM | POA: Diagnosis not present

## 2022-04-06 DIAGNOSIS — Z23 Encounter for immunization: Secondary | ICD-10-CM

## 2022-04-06 DIAGNOSIS — Z7901 Long term (current) use of anticoagulants: Secondary | ICD-10-CM | POA: Diagnosis not present

## 2022-04-06 LAB — POCT INR: INR: 2.6 (ref 2.0–3.0)

## 2022-04-06 NOTE — Patient Instructions (Signed)
Patient instructed to take medications as defined in the Anti-coagulation Track section of this encounter.  Patient instructed to take today's dose.  Patient instructed to take one-and-one-half (1 & 1/2) tablets of your 2mg strength, lavender colored warfarin tablets, by mouth, once-daily.  Patient verbalized understanding of these instructions.  

## 2022-04-06 NOTE — Progress Notes (Signed)
Anticoagulation Management Pamela [name] is a 76 y.o. female who reports to the clinic for monitoring of warfarin treatment.    Indication:  Secondary hypercoagulable state; Long term current use of oral anticoagulant, warfarin. Target INR 2.0 - 3.0.    Duration: indefinite Supervising physician:  Dorian Pod, MD  Anticoagulation Clinic Visit History: Patient does not report signs/symptoms of bleeding or thromboembolism  Other recent changes: No diet, medications, lifestyle changes.  Anticoagulation Episode Summary     Current INR goal:  2.0-3.0  TTR:  61.9 % (11.3 y)  Next INR check:  05/04/2022  INR from last check:  2.6 (04/06/2022)  Weekly max warfarin dose:    Target end date:  Indefinite  INR check location:  Anticoagulation Clinic  Preferred lab:    Send INR reminders to:  [facility]   Indications   Secondary hypercoagulable state ([place]) [D68.69] DVT HX OF (Resolved) [Z86.718] Long term (current) use of anticoagulants [Z79.01]        Comments:          Anticoagulation Care Providers     Provider Role Specialty Phone number   Burman Freestone, MD  Internal Medicine [phone]       Allergies  Allergen Reactions   Ace Inhibitors Cough    Current Outpatient Medications:    colestipol (COLESTID) 1 g tablet, Take up to 6 tablets by mouth as needed no more than once daily for chronic biliary diarrhea, Disp: 180 tablet, Rfl: 3   denosumab (PROLIA) 60 MG/ML SOSY injection, Inject 60 mg into the skin every 6 (six) months., Disp: , Rfl:    levothyroxine (SYNTHROID) 88 MCG tablet, Take 1 tablet (88 mcg total) by mouth daily before breakfast., Disp: 90 tablet, Rfl: 3   losartan (COZAAR) 25 MG tablet, Take 1 tablet (25 mg total) by mouth daily., Disp: 90 tablet, Rfl: 3   Multiple Vitamins-Minerals (ALGAE BASED CALCIUM) TABS, Take 500 mg by mouth daily., Disp: , Rfl:    pantoprazole (PROTONIX) 20 MG tablet, Take 1 tablet (20 mg total) by mouth daily.,  Disp: 90 tablet, Rfl: 3   warfarin (COUMADIN) 2 MG tablet, Take two (2) tablets on Mondays and Thursdays. All other days, Sundays,Tuesdays, Wednesdays, Fridays and Saturdays, take only one-and-one-half (1 & 1/2) tablets., Disp: 50 tablet, Rfl: 3   colchicine 0.6 MG tablet, Take 1 tablet by mouth twice a day for gout flare ups (Patient not taking: Reported on 01/26/2022), Disp: 20 tablet, Rfl: 1   COVID-19 mRNA bivalent vaccine, Pfizer, (PFIZER COVID-19 VAC BIVALENT) injection, Inject into the muscle. (Patient not taking: Reported on 02/16/2022), Disp: 0.3 mL, Rfl: 0   predniSONE (DELTASONE) 10 MG tablet, Take 6 tabets by mouth for 4 days and then take 4 tablets by mouth for 4 days and then 2 tablets by mouth for 4 days. Drink plenty of water with this mediciine. (Patient not taking: Reported on 01/26/2022), Disp: 48 tablet, Rfl: 0   Zoster Vaccine Adjuvanted [facility]) injection, Inject 0.5 mLs into the muscle. (Patient not taking: Reported on 01/26/2022), Disp: 0.5 mL, Rfl: 1 Past Medical History:  Diagnosis Date   Anemia    Anemia requiring transfusions 06/03/2006   Required post operative transfusion 01/2020. Mixed anemia with hx of macrocytic B12 deficiency anemia.    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 Percell Miller with [facility] is following.   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       Current Social History 10/18/2019        Patient lives alone in a 2 level home where laundry is in the basement. There is one step without handrail up to the entrance patient uses.      Patient's method of transportation is personal car.      The highest level of education was advanced degree; Master's in Education.      The patient currently retired.      Identified important Relationships are My daughter, good friend, Manuela Schwartz who is starting with dementia. Several women from church and [place].      Pets : None       Interests / Fun: Read a lot, take walks, [employer] with church groups and [place] groups.       Current Stressors: "Cancer. And unable to get knee replacement last year 2/2 Covid pandemic and this year 2/2 Cancer. Hair is starting to fall out 2/2 chemo"      Religious / Personal Beliefs: "Episcopalian, Christian. I believe in God and [place]."       L. Ducatte, BSN, RN-BC       Social Determinants of Health   Financial Resource Strain: Not on file  Food Insecurity: No Food Insecurity (03/24/2022)   Hunger Vital Sign     Worried About Running Out of Food in the Last Year: Never true    [place] in the Last Year: Never true  Transportation Needs: No Transportation Needs (03/24/2022)   PRAPARE - Hydrologist (Medical): No    Lack of Transportation (Non-Medical): No  Physical Activity: Not on file  Stress: Not on file  Social Connections: Not on file   Family History  Problem Relation Age  of Onset   Cancer Mother        H&N, smoker   Liver disease Mother    Cancer Sister        lung, 2011, smoker   Other Other        grandmother had mastectomy in her 74's unknown reason   Macular degeneration Father    Diverticulitis Father    Breast cancer Neg Hx     ASSESSMENT Recent Results: The most recent result is correlated with 21 mg per week: Lab Results  Component Value Date   INR 2.6 04/06/2022   INR 2.9 03/16/2022   INR 3.3 (A) 02/26/2022    Anticoagulation Dosing: Description   Take one-and-one-half (1 & 1/2) tablets of your '2mg'$  strength, lavender colored warfarin tablets, by mouth, once-daily.      INR today: Therapeutic  PLAN Weekly dose was unchanged. Patient requested and was provided age appropriate influenza vaccination by Adline Peals, [place].   Patient Instructions  Patient instructed to take medications as defined in the Anti-coagulation Track section of this encounter.  Patient instructed to take today's dose.  Patient instructed to take one-and-one-half (1 & 1/2) tablets of your '2mg'$  strength, lavender colored warfarin tablets, by mouth, once-daily.  Patient verbalized understanding of these instructions.   Patient advised to contact clinic or seek medical attention if signs/symptoms of bleeding or thromboembolism occur.  Patient verbalized understanding by repeating back information and was advised to contact me if further medication-related questions arise. Patient was also provided an information handout.  Follow-up Return in 4 weeks (on  05/04/2022) for Follow up INR.  Pennie Banter, PharmD, CPP  15 minutes spent face-to-face with the patient during the encounter. 50% of time spent on education, including signs/sx bleeding and clotting, as well as food and drug interactions with warfarin. 50% of time was spent on fingerprick POC INR sample collection,processing, results determination, and documentation in [email or web address]/.

## 2022-04-14 ENCOUNTER — Ambulatory Visit (HOSPITAL_COMMUNITY)
Admission: RE | Admit: 2022-04-14 | Discharge: 2022-04-14 | Disposition: A | Discharge status: Home / Self Care | Payer: Medicare Other | Source: Ambulatory Visit | Attending: Nurse Practitioner | Admitting: Nurse Practitioner

## 2022-04-14 DIAGNOSIS — N99528 Other complication of other external stoma of urinary tract: Secondary | ICD-10-CM | POA: Insufficient documentation

## 2022-04-14 DIAGNOSIS — R634 Abnormal weight loss: Secondary | ICD-10-CM | POA: Diagnosis not present

## 2022-04-14 DIAGNOSIS — L24B3 Irritant contact dermatitis related to fecal or urinary stoma or fistula: Secondary | ICD-10-CM | POA: Diagnosis not present

## 2022-04-14 DIAGNOSIS — Z432 Encounter for attention to ileostomy: Secondary | ICD-10-CM | POA: Insufficient documentation

## 2022-04-14 NOTE — Discharge Instructions (Signed)
We switched to pre sized pouch Added belt Added powder and skin prep Keep bag off floor at night Call as needed.

## 2022-04-14 NOTE — Progress Notes (Signed)
[facility]   Reason for visit:  RLQ ileal conduit x 2 years.  Recently noticing leaks and unreliable pouch wear time HPI:  Urolethial cancer with ileal conduit ROS  Review of Systems  Constitutional:        Recent weight loss since urostomy surgery and had a knee replacement.   Gastrointestinal:        Ileal conduit  Genitourinary:        RLQ ileal conduit  All other systems reviewed and are negative.  Vital signs:  BP (!) 149/68 (BP Location: Right Arm)   Pulse 88   Temp 97.9 F (36.6 C) (Oral)   Resp 18   SpO2 95%  Exam:  Physical Exam Vitals reviewed.  Constitutional:      Appearance: Normal appearance.  Abdominal:     Palpations: Abdomen is soft.  Skin:    Coloration: Skin is pale.     Comments: Peristomal maceration  Neurological:     Mental Status: She is oriented to person, place, and time.  Psychiatric:        Mood and Affect: Mood normal.        Behavior: Behavior normal.     Stoma type/location:  RLQ ileostomy Stomal assessment/size:  1" slightly budded pink and moist Peristomal assessment:  intact with maceration from 11 to 2 o'clock.  This is the site of leaks Has had 30 pound weight loss Turns from side to side at night Wears nighttime drainage bag at night, places on floor Treatment options for stomal/peristomal skin: barrier ring and convex pouch Output: clear yellow urine Ostomy pouching: 1pc.convex  switch to pre sized 1 1/8" opening with barrier ring  Implement stoma powder and skin prep to maceration.  Was using skin prep with alcohol, recommend no sting, alcohol free.  Will add ostomy belt to hold pouch securely.  Recommend hanging bag on side of bed (use command hooks)  switch side to side when she turns over.   She asks about wide support belt for when she wears more fitted clothing.  I fit her in a coloplast ostomy support belt (4XL)  she does not want to cut the hole but place belt over pouch.  She understands she will need to  empty and not fasten this too tightly.  It is for occasional use only.  Education provided:  See above. [employer] new items in [employer] catalog.   1 piece presized pouch Barrier ring Stoma powder and no sting skin prep Ostomy belt. Give her a coloplast support belt.     Impression/dx  Ileal conduit Discussion  See above Plan  Will call office as needed for ongoing support.     Visit time: 55 minutes.   Domenic Moras FNP-BC

## 2022-04-16 ENCOUNTER — Other Ambulatory Visit: Payer: Self-pay

## 2022-04-21 ENCOUNTER — Other Ambulatory Visit: Payer: Self-pay | Admitting: Internal Medicine

## 2022-04-21 ENCOUNTER — Other Ambulatory Visit (HOSPITAL_COMMUNITY): Payer: Self-pay

## 2022-04-21 DIAGNOSIS — E039 Hypothyroidism, unspecified: Secondary | ICD-10-CM

## 2022-04-22 ENCOUNTER — Other Ambulatory Visit: Payer: Self-pay | Admitting: Internal Medicine

## 2022-04-22 ENCOUNTER — Other Ambulatory Visit (HOSPITAL_COMMUNITY): Payer: Self-pay

## 2022-04-22 MED ORDER — LEVOTHYROXINE SODIUM 88 MCG PO TABS
88.0000 ug | ORAL_TABLET | Freq: Every day | ORAL | 3 refills | Status: DC
  Filled 2022-04-22: qty 90, 90d supply, fill #0
  Filled 2022-07-22: qty 90, 90d supply, fill #1
  Filled 2022-10-23: qty 90, 90d supply, fill #2
  Filled 2023-01-20: qty 90, 90d supply, fill #3

## 2022-04-23 ENCOUNTER — Other Ambulatory Visit (HOSPITAL_COMMUNITY): Payer: Self-pay

## 2022-05-01 ENCOUNTER — Other Ambulatory Visit: Payer: Self-pay

## 2022-05-04 ENCOUNTER — Ambulatory Visit (INDEPENDENT_AMBULATORY_CARE_PROVIDER_SITE_OTHER): Payer: Medicare Other | Admitting: Pharmacist

## 2022-05-04 DIAGNOSIS — D6869 Other thrombophilia: Secondary | ICD-10-CM

## 2022-05-04 DIAGNOSIS — Z7901 Long term (current) use of anticoagulants: Secondary | ICD-10-CM | POA: Diagnosis not present

## 2022-05-04 LAB — POCT INR: INR: 2.9 (ref 2.0–3.0)

## 2022-05-04 NOTE — Patient Instructions (Signed)
Patient instructed to take medications as defined in the Anti-coagulation Track section of this encounter.  Patient instructed to take today's dose.  Patient instructed to take one-and-one-half (1 & 1/2) tablets of your '2mg'$  strength, lavender colored warfarin tablets, by mouth, once-daily--EXCEPT on MONDAYS, take only one (1) tablet on Mondays.  Patient verbalized understanding of these instructions.

## 2022-05-04 NOTE — Progress Notes (Signed)
Anticoagulation Management Pamela [name] is a 76 y.o. female who reports to the clinic for monitoring of warfarin treatment.    Indication:  Hypercoagulable state requiring oral anticoagulation with warfarin, INR range 2.0 - 3.0   Duration: indefinite Supervising physician: [facility] Visit History: Patient does not report signs/symptoms of bleeding or thromboembolism  Other recent changes: No diet, medications, lifestyle changes endorsed by the patient at this visit.  Anticoagulation Episode Summary     Current INR goal:  2.0-3.0  TTR:  62.2 % (11.4 y)  Next INR check:  06/01/2022  INR from last check:  2.9 (05/04/2022)  Weekly max warfarin dose:    Target end date:  Indefinite  INR check location:  Anticoagulation Clinic  Preferred lab:    Send INR reminders to:  ANTICOAG [facility]   Indications   Secondary hypercoagulable state ([place]) [D68.69] DVT HX OF (Resolved) [Z86.718] Long term (current) use of anticoagulants [Z79.01]        Comments:          Anticoagulation Care Providers     Provider Role Specialty Phone number   Burman Freestone, MD  Internal Medicine [phone]       Allergies  Allergen Reactions   Ace Inhibitors Cough    Current Outpatient Medications:    colestipol (COLESTID) 1 g tablet, Take up to 6 tablets by mouth as needed no more than once daily for chronic biliary diarrhea, Disp: 180 tablet, Rfl: 3   COVID-19 mRNA bivalent vaccine, Pfizer, (PFIZER COVID-19 VAC BIVALENT) injection, Inject into the muscle., Disp: 0.3 mL, Rfl: 0   denosumab (PROLIA) 60 MG/ML SOSY injection, Inject 60 mg into the skin every 6 (six) months., Disp: , Rfl:    levothyroxine (SYNTHROID) 88 MCG tablet, Take 1 tablet (88 mcg total) by mouth daily before breakfast., Disp: 90 tablet, Rfl: 3   losartan (COZAAR) 25 MG tablet, Take 1 tablet (25 mg total) by mouth daily., Disp: 90 tablet, Rfl: 3   Multiple Vitamins-Minerals (ALGAE BASED CALCIUM)  TABS, Take 500 mg by mouth daily., Disp: , Rfl:    pantoprazole (PROTONIX) 20 MG tablet, Take 1 tablet (20 mg total) by mouth daily., Disp: 90 tablet, Rfl: 3   warfarin (COUMADIN) 2 MG tablet, Take two (2) tablets on Mondays and Thursdays. All other days, Sundays,Tuesdays, Wednesdays, Fridays and Saturdays, take only one-and-one-half (1 & 1/2) tablets., Disp: 50 tablet, Rfl: 3   colchicine 0.6 MG tablet, Take 1 tablet by mouth twice a day for gout flare ups (Patient not taking: Reported on 01/26/2022), Disp: 20 tablet, Rfl: 1   predniSONE (DELTASONE) 10 MG tablet, Take 6 tabets by mouth for 4 days and then take 4 tablets by mouth for 4 days and then 2 tablets by mouth for 4 days. Drink plenty of water with this mediciine. (Patient not taking: Reported on 01/26/2022), Disp: 48 tablet, Rfl: 0   Zoster Vaccine Adjuvanted [facility]) injection, Inject 0.5 mLs into the muscle. (Patient not taking: Reported on 05/04/2022), Disp: 0.5 mL, Rfl: 1 Past Medical History:  Diagnosis Date   Anemia    Anemia requiring transfusions 06/03/2006   Required post operative transfusion 01/2020. Mixed anemia with hx of macrocytic B12 deficiency anemia.    Arthritis    CHOLELITHIASIS, WITH OBSTRUCTION 04/21/2006   s/p ERCP,sprincterotomy, stent ([facility])   Complication of anesthesia    DVT (deep venous thrombosis) (HCC)    hx of    Factor II deficiency ([place])    II  mutation-[id number]-on chronic coumadin tx   GERD (gastroesophageal reflux disease)    GLAUCOMA 04/24/2008   resolved per pt as of preop on 12/04/20    HYPERLIPIDEMIA 06/03/2006   Hypertension    Hypothyroidism lifelong   OBESITY, MILD 04/24/2008   Osteoarthritis of right knee, now s/p R TKR 03/02/2019   Dr. Percell Miller with [facility] is following.   PONV (postoperative nausea and vomiting)    S/P total knee arthroplasty, right 12/10/2020   Urothelial cancer (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.       Current Social History 10/18/2019        Patient lives alone in a 2 level home where laundry is in the basement. There is one step without handrail up to the entrance patient uses.      Patient's method of transportation is personal car.      The highest level of education was advanced degree; Master's in Education.      The patient currently retired.      Identified important Relationships are My daughter, good friend, Manuela Schwartz who is starting with dementia. Several women from church and [place].      Pets : None       Interests / Fun: Read a lot, take walks, [employer] with church groups and [place] groups.       Current Stressors: "Cancer. And unable to get knee replacement last year 2/2 Covid pandemic and this year 2/2 Cancer. Hair is starting to fall out 2/2 chemo"      Religious / Personal Beliefs: "Episcopalian, Christian. I believe in God and [place]."       L. Ducatte, BSN, RN-BC       Social Determinants of Health   Financial Resource Strain: Not on file  Food Insecurity: No Food Insecurity (03/24/2022)   Hunger Vital Sign    Worried About Running Out of Food in the  Last Year: Never true    [place] in the Last Year: Never true  Transportation Needs: No Transportation Needs (03/24/2022)   PRAPARE - Hydrologist (Medical): No    Lack of Transportation (Non-Medical): No  Physical Activity: Not on file  Stress: Not on file  Social Connections: Not on file   Family History  Problem Relation Age of Onset   Cancer Mother  H&N, smoker   Liver disease Mother    Cancer Sister        lung, 2011, smoker   Other Other        grandmother had mastectomy in her 63's unknown reason   Macular degeneration Father    Diverticulitis Father    Breast cancer Neg Hx     ASSESSMENT Recent Results: The most recent result is correlated with 21 mg per week: Lab Results  Component Value Date   INR 2.9 05/04/2022   INR 2.6 04/06/2022   INR 2.9 03/16/2022    Anticoagulation Dosing: Description   Take one-and-one-half (1 & 1/2) tablets of your '2mg'$  strength, lavender colored warfarin tablets, by mouth, once-daily--EXCEPT on MONDAYS, take only one (1) tablet on Mondays.      INR today: Therapeutic  PLAN Weekly dose was decreased by 5% to 20 mg per week  Patient Instructions  Patient instructed to take medications as defined in the Anti-coagulation Track section of this encounter.  Patient instructed to take today's dose.  Patient instructed to take one-and-one-half (1 & 1/2) tablets of your '2mg'$  strength, lavender colored warfarin tablets, by mouth, once-daily--EXCEPT on MONDAYS, take only one (1) tablet on Mondays.  Patient verbalized understanding of these instructions.   Patient advised to contact clinic or seek medical attention if signs/symptoms of bleeding or thromboembolism occur.  Patient verbalized understanding by repeating back information and was advised to contact me if further medication-related questions arise. Patient was also provided an information handout.  Follow-up Return in 4 weeks (on 06/01/2022)  for Follow up INR.  Pennie Banter, PharmD, CPP  15 minutes spent face-to-face with the patient during the encounter. 50% of time spent on education, including signs/sx bleeding and clotting, as well as food and drug interactions with warfarin. 50% of time was spent on fingerprick POC INR sample collection,processing, results determination, and documentation in [email or web address].kim.net/.

## 2022-05-05 ENCOUNTER — Other Ambulatory Visit (HOSPITAL_COMMUNITY): Payer: Self-pay

## 2022-05-05 ENCOUNTER — Other Ambulatory Visit: Payer: Self-pay | Admitting: Internal Medicine

## 2022-05-05 MED ORDER — PANTOPRAZOLE SODIUM 20 MG PO TBEC
20.0000 mg | DELAYED_RELEASE_TABLET | Freq: Every day | ORAL | 3 refills | Status: DC
  Filled 2022-05-05: qty 90, 90d supply, fill #0
  Filled 2022-07-28: qty 90, 90d supply, fill #1
  Filled 2022-11-19: qty 90, 90d supply, fill #2
  Filled 2023-03-09: qty 90, 90d supply, fill #3

## 2022-05-05 NOTE — Telephone Encounter (Signed)
Next appt scheduled 07/23/22 with PCP.

## 2022-05-20 ENCOUNTER — Encounter (HOSPITAL_COMMUNITY): Payer: Self-pay | Admitting: Nurse Practitioner

## 2022-05-25 ENCOUNTER — Other Ambulatory Visit (HOSPITAL_COMMUNITY): Payer: Self-pay

## 2022-05-25 ENCOUNTER — Telehealth: Payer: Self-pay | Admitting: Oncology

## 2022-05-25 NOTE — Telephone Encounter (Signed)
Called patient to r/s for after Dr. Hazeline Junker transition, patient r/s w/ Cassie and notified.

## 2022-06-01 ENCOUNTER — Ambulatory Visit (INDEPENDENT_AMBULATORY_CARE_PROVIDER_SITE_OTHER): Payer: Medicare Other | Admitting: Pharmacist

## 2022-06-01 DIAGNOSIS — Z7901 Long term (current) use of anticoagulants: Secondary | ICD-10-CM

## 2022-06-01 DIAGNOSIS — D6869 Other thrombophilia: Secondary | ICD-10-CM | POA: Diagnosis not present

## 2022-06-01 LAB — POCT INR: INR: 2.1 (ref 2.0–3.0)

## 2022-06-01 NOTE — Progress Notes (Signed)
Anticoagulation Management Pamela [name] is a 76 y.o. female who reports to the clinic for monitoring of warfarin treatment.    Indication:  Secondary hypercoagulable state; History of PE and DVT; Long term current use of oral anticoagulant with warfarin. Target INR range 2.0 - 3.0.    Duration: indefinite Supervising physician: Aldine Contes  Anticoagulation Clinic Visit History: Patient does not report signs/symptoms of bleeding or thromboembolism  Other recent changes: No diet, medications, lifestyle changes. Anticoagulation Episode Summary     Current INR goal:  2.0-3.0  TTR:  62.4 % (11.4 y)  Next INR check:  07/13/2022  INR from last check:  2.10 (06/01/2022)  Weekly max warfarin dose:    Target end date:  Indefinite  INR check location:  Anticoagulation Clinic  Preferred lab:    Send INR reminders to:  [facility]   Indications   Secondary hypercoagulable state ([place]) [D68.69] DVT HX OF (Resolved) [Z86.718] Long term (current) use of anticoagulants [Z79.01]        Comments:          Anticoagulation Care Providers     Provider Role Specialty Phone number   Burman Freestone, MD  Internal Medicine [phone]       Allergies  Allergen Reactions   Ace Inhibitors Cough    Current Outpatient Medications:    colestipol (COLESTID) 1 g tablet, Take up to 6 tablets by mouth as needed no more than once daily for chronic biliary diarrhea, Disp: 180 tablet, Rfl: 3   COVID-19 mRNA bivalent vaccine, Pfizer, (PFIZER COVID-19 VAC BIVALENT) injection, Inject into the muscle., Disp: 0.3 mL, Rfl: 0   denosumab (PROLIA) 60 MG/ML SOSY injection, Inject 60 mg into the skin every 6 (six) months., Disp: , Rfl:    levothyroxine (SYNTHROID) 88 MCG tablet, Take 1 tablet (88 mcg total) by mouth daily before breakfast., Disp: 90 tablet, Rfl: 3   losartan (COZAAR) 25 MG tablet, Take 1 tablet (25 mg total) by mouth daily., Disp: 90 tablet, Rfl: 3   Multiple Vitamins-Minerals  (ALGAE BASED CALCIUM) TABS, Take 500 mg by mouth daily., Disp: , Rfl:    pantoprazole (PROTONIX) 20 MG tablet, Take 1 tablet (20 mg total) by mouth daily., Disp: 90 tablet, Rfl: 3   warfarin (COUMADIN) 2 MG tablet, Take two (2) tablets on Mondays and Thursdays. All other days, Sundays,Tuesdays, Wednesdays, Fridays and Saturdays, take only one-and-one-half (1 & 1/2) tablets., Disp: 50 tablet, Rfl: 3   colchicine 0.6 MG tablet, Take 1 tablet by mouth twice a day for gout flare ups (Patient not taking: Reported on 01/26/2022), Disp: 20 tablet, Rfl: 1   predniSONE (DELTASONE) 10 MG tablet, Take 6 tabets by mouth for 4 days and then take 4 tablets by mouth for 4 days and then 2 tablets by mouth for 4 days. Drink plenty of water with this mediciine. (Patient not taking: Reported on 01/26/2022), Disp: 48 tablet, Rfl: 0   Zoster Vaccine Adjuvanted [facility]) injection, Inject 0.5 mLs into the muscle. (Patient not taking: Reported on 05/04/2022), Disp: 0.5 mL, Rfl: 1 Past Medical History:  Diagnosis Date   Anemia    Anemia requiring transfusions 06/03/2006   Required post operative transfusion 01/2020. Mixed anemia with hx of macrocytic B12 deficiency anemia.    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 Percell Miller with [facility] is following.   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 Then worked in [employer] with [place].       Current Social History 10/18/2019        Patient lives alone in a 2 level home where laundry is in the basement. There is one step without handrail up to the entrance patient uses.      Patient's method of transportation is personal car.      The highest level of education was advanced degree; Master's in Education.      The patient currently retired.      Identified important Relationships are My daughter, good friend, Manuela Schwartz who is starting with dementia. Several women from church and [place].      Pets : None       Interests / Fun: Read a lot, take walks, [employer] with church groups and [place] groups.       Current Stressors: "Cancer. And unable to get knee replacement last year 2/2 Covid pandemic and this year 2/2 Cancer. Hair is starting to fall out 2/2 chemo"      Religious / Personal Beliefs: "Episcopalian, Christian. I believe in God and [place]."       L. Ducatte, BSN, RN-BC       Social Determinants of Health   Financial Resource Strain: Not on file  Food Insecurity: No Food Insecurity (03/24/2022)   Hunger Vital Sign    Worried About  Running Out of Food in the Last Year: Never true    [place] in the Last Year: Never true  Transportation Needs: No Transportation Needs (03/24/2022)   PRAPARE - Hydrologist (Medical): No    Lack of Transportation (Non-Medical): No  Physical Activity: Not on file  Stress: Not on file  Social Connections: Not on file   Family History  Problem Relation Age of Onset   Cancer  Mother        H&N, smoker   Liver disease Mother    Cancer Sister        lung, 2011, smoker   Other Other        grandmother had mastectomy in her 80's unknown reason   Macular degeneration Father    Diverticulitis Father    Breast cancer Neg Hx     ASSESSMENT Recent Results: The most recent result is correlated with 20 mg per week: Lab Results  Component Value Date   INR 2.0 06/01/2022   INR 2.10 06/01/2022   INR 2.9 05/04/2022    Anticoagulation Dosing: Description   Take one-and-one-half (1 & 1/2) tablets of your '2mg'$  strength, lavender colored warfarin tablets, by mouth, once-daily.      INR today: Therapeutic  PLAN Weekly dose was increased by 5% to 21 mg per week  Patient Instructions  Patient instructed to take medications as defined in the Anti-coagulation Track section of this encounter.  Patient instructed to take today's dose.  Patient instructed to take one-and-one-half (1 & 1/2) tablets of your '2mg'$  strength, lavender colored warfarin tablets, by mouth, once-daily.  Patient verbalized understanding of these instructions.  Patient advised to contact clinic or seek medical attention if signs/symptoms of bleeding or thromboembolism occur.  Patient verbalized understanding by repeating back information and was advised to contact me if further medication-related questions arise. Patient was also provided an information handout.  Follow-up Return in 6 weeks (on 07/13/2022) for Follow up INR.  Pennie Banter, PharmD, CPP  15 minutes spent face-to-face with  the patient during the encounter. 50% of time spent on education, including signs/sx bleeding and clotting, as well as food and drug interactions with warfarin. 50% of time was spent on fingerprick POC INR sample collection,processing, results determination, and documentation in [email or web address].kim.net/.

## 2022-06-01 NOTE — Addendum Note (Signed)
Addended by: Marcelino Duster on: 06/01/2022 11:04 AM   Modules accepted: Orders

## 2022-06-01 NOTE — Patient Instructions (Signed)
Patient instructed to take medications as defined in the Anti-coagulation Track section of this encounter.  Patient instructed to take today's dose.  Patient instructed to take one-and-one-half (1 & 1/2) tablets of your '2mg'$  strength, lavender colored warfarin tablets, by mouth, once-daily.  Patient verbalized understanding of these instructions.

## 2022-06-03 NOTE — Progress Notes (Signed)
INTERNAL MEDICINE TEACHING ATTENDING ADDENDUM - Aldine Contes M.D  Duration- indefinite, Indication- secondary hypercoaguanle state,DVT/PE , INR- therapeutic. Agree with pharmacy recommendations as outlined in their note.

## 2022-06-27 ENCOUNTER — Other Ambulatory Visit: Payer: Self-pay | Admitting: Pharmacist

## 2022-07-02 ENCOUNTER — Other Ambulatory Visit (HOSPITAL_COMMUNITY): Payer: Self-pay

## 2022-07-02 ENCOUNTER — Other Ambulatory Visit: Payer: Self-pay | Admitting: Pharmacist

## 2022-07-03 ENCOUNTER — Other Ambulatory Visit (HOSPITAL_COMMUNITY): Payer: Self-pay

## 2022-07-06 ENCOUNTER — Encounter: Payer: Self-pay | Admitting: Oncology

## 2022-07-07 ENCOUNTER — Encounter: Payer: Self-pay | Admitting: Oncology

## 2022-07-07 ENCOUNTER — Other Ambulatory Visit (HOSPITAL_COMMUNITY): Payer: Self-pay

## 2022-07-07 MED ORDER — WARFARIN SODIUM 2 MG PO TABS
ORAL_TABLET | ORAL | 3 refills | Status: DC
  Filled 2022-07-07: qty 44, 29d supply, fill #0
  Filled 2022-07-28: qty 44, 29d supply, fill #1
  Filled 2022-08-28: qty 44, 29d supply, fill #2
  Filled 2022-09-28: qty 44, 29d supply, fill #3

## 2022-07-08 ENCOUNTER — Other Ambulatory Visit (HOSPITAL_COMMUNITY): Payer: Self-pay

## 2022-07-08 ENCOUNTER — Encounter: Payer: Self-pay | Admitting: Oncology

## 2022-07-13 ENCOUNTER — Ambulatory Visit (INDEPENDENT_AMBULATORY_CARE_PROVIDER_SITE_OTHER): Payer: BLUE CROSS/BLUE SHIELD | Admitting: Pharmacist

## 2022-07-13 DIAGNOSIS — Z7901 Long term (current) use of anticoagulants: Secondary | ICD-10-CM | POA: Diagnosis not present

## 2022-07-13 DIAGNOSIS — D6869 Other thrombophilia: Secondary | ICD-10-CM

## 2022-07-13 LAB — POCT INR: POC INR: 1.7

## 2022-07-13 NOTE — Progress Notes (Signed)
Anticoagulation Management Pamela [name] is a 77 y.o. female who reports to the clinic for monitoring of warfarin treatment.    Indication:  Secondary hypercoagulable state with multiple VTE episodes; long term current use of oral anticoagulant, warfarin. INR target range 2.0 - 3.0.   Duration: indefinite Supervising physician: Joni Reining  Anticoagulation Clinic Visit History: Patient does not report signs/symptoms of bleeding or thromboembolism  Other recent changes: No diet, medications, lifestyle Anticoagulation Episode Summary     Current INR goal:  2.0-3.0  TTR:  62.1 % (11.5 y)  Next INR check:  08/10/2022  INR from last check:  1.70 (07/13/2022)  Weekly max warfarin dose:    Target end date:  Indefinite  INR check location:  Anticoagulation Clinic  Preferred lab:    Send INR reminders to:  [facility]   Indications   Secondary hypercoagulable state ([place]) [D68.69] DVT HX OF (Resolved) [Z86.718] Long term (current) use of anticoagulants [Z79.01]        Comments:          Anticoagulation Care Providers     Provider Role Specialty Phone number   Burman Freestone, MD  Internal Medicine [phone]       Allergies  Allergen Reactions   Ace Inhibitors Cough    Current Outpatient Medications:    colestipol (COLESTID) 1 g tablet, Take up to 6 tablets by mouth as needed no more than once daily for chronic biliary diarrhea, Disp: 180 tablet, Rfl: 3   COVID-19 mRNA bivalent vaccine, Pfizer, (PFIZER COVID-19 VAC BIVALENT) injection, Inject into the muscle., Disp: 0.3 mL, Rfl: 0   denosumab (PROLIA) 60 MG/ML SOSY injection, Inject 60 mg into the skin every 6 (six) months., Disp: , Rfl:    levothyroxine (SYNTHROID) 88 MCG tablet, Take 1 tablet (88 mcg total) by mouth daily before breakfast., Disp: 90 tablet, Rfl: 3   losartan (COZAAR) 25 MG tablet, Take 1 tablet (25 mg total) by mouth daily., Disp: 90 tablet, Rfl: 3   Multiple Vitamins-Minerals (ALGAE BASED  CALCIUM) TABS, Take 500 mg by mouth daily., Disp: , Rfl:    pantoprazole (PROTONIX) 20 MG tablet, Take 1 tablet (20 mg total) by mouth daily., Disp: 90 tablet, Rfl: 3   warfarin (COUMADIN) 2 MG tablet, Take one-and-one-half (1 & 1/2) tablets by mouth, once-daily., Disp: 44 tablet, Rfl: 3   Zoster Vaccine Adjuvanted [facility]) injection, Inject 0.5 mLs into the muscle., Disp: 0.5 mL, Rfl: 1   colchicine 0.6 MG tablet, Take 1 tablet by mouth twice a day for gout flare ups (Patient not taking: Reported on 01/26/2022), Disp: 20 tablet, Rfl: 1   predniSONE (DELTASONE) 10 MG tablet, Take 6 tabets by mouth for 4 days and then take 4 tablets by mouth for 4 days and then 2 tablets by mouth for 4 days. Drink plenty of water with this mediciine. (Patient not taking: Reported on 01/26/2022), Disp: 48 tablet, Rfl: 0 Past Medical History:  Diagnosis Date   Anemia    Anemia requiring transfusions 06/03/2006   Required post operative transfusion 01/2020. Mixed anemia with hx of macrocytic B12 deficiency anemia.    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 Percell Miller with [facility] is following.   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.       Current Social History 10/18/2019        Patient lives alone in a 2 level home where laundry is in the basement. There is one step without handrail up to the entrance patient uses.      Patient's method of transportation is personal car.      The highest level of education was advanced degree; Master's in Education.      The patient currently retired.      Identified important Relationships are My daughter, good friend, Manuela Schwartz who is starting with dementia. Several women from church and [place].      Pets : None       Interests / Fun: Read a lot, take walks, [employer] with church groups and [place] groups.       Current Stressors: "Cancer. And unable to get knee replacement last year 2/2 Covid pandemic and this year 2/2 Cancer. Hair is starting to fall out 2/2 chemo"      Religious / Personal Beliefs: "Episcopalian, Christian. I believe in God and [place]."       L. Ducatte, BSN, RN-BC       Social Determinants of Health   Financial Resource Strain: Not on file  Food Insecurity: No Food Insecurity (03/24/2022)   Hunger Vital Sign    Worried About Running Out of Food in the Last Year: Never true    [place] in the Last Year: Never true  Transportation Needs: No Transportation Needs  (03/24/2022)   PRAPARE - Hydrologist (Medical): No    Lack of Transportation (Non-Medical): No  Physical Activity: Not on file  Stress: Not on file  Social Connections: Not on file   Family History  Problem Relation Age of Onset   Cancer Mother        H&N, smoker   Liver disease Mother    Cancer Sister  lung, 2011, smoker   Other Other        grandmother had mastectomy in her 31's unknown reason   Macular degeneration Father    Diverticulitis Father    Breast cancer Neg Hx     ASSESSMENT Recent Results: The most recent result is correlated with 21 mg per week: Lab Results  Component Value Date   INR 1.70 07/13/2022   INR 2.1 06/01/2022   INR 2.9 05/04/2022    Anticoagulation Dosing: Description   Take one-and-one-half (1 & 1/2) tablets of your '2mg'$  strength, lavender colored warfarin tablets, by mouth, once-daily--EXCEPT on MONDAYS AND THURSDAYS, TAKE TWO (2) tablets on Mondays and Thursdays.      INR today: Subtherapeutic  PLAN Weekly dose was increased by 10% to 23 mg per week  Patient Instructions  Patient instructed to take medications as defined in the Anti-coagulation Track section of this encounter.  Patient instructed to take today's dose.  Patient instructed to take one-and-one-half (1 & 1/2) tablets of your '2mg'$  strength, lavender colored warfarin tablets, by mouth, once-daily--EXCEPT on MONDAYS AND THURSDAYS, TAKE TWO (2) tablets on Mondays and Thursdays.  Patient verbalized understanding of these instructions.  Patient advised to contact clinic or seek medical attention if signs/symptoms of bleeding or thromboembolism occur.  Patient verbalized understanding by repeating back information and was advised to contact me if further medication-related questions arise. Patient was also provided an information handout.  Follow-up Return in 4 weeks (on 08/10/2022) for Follow up INR.  Pennie Banter, PharmD, CPP  15 minutes spent  face-to-face with the patient during the encounter. 50% of time spent on education, including signs/sx bleeding and clotting, as well as food and drug interactions with warfarin. 50% of time was spent on fingerprick POC INR sample collection,processing, results determination, and documentation in http://www.[email or web address]/.

## 2022-07-13 NOTE — Patient Instructions (Signed)
Patient instructed to take medications as defined in the Anti-coagulation Track section of this encounter.  Patient instructed to take today's dose.  Patient instructed to take one-and-one-half (1 & 1/2) tablets of your '2mg'$  strength, lavender colored warfarin tablets, by mouth, once-daily--EXCEPT on MONDAYS AND THURSDAYS, TAKE TWO (2) tablets on Mondays and Thursdays.  Patient verbalized understanding of these instructions.

## 2022-07-22 ENCOUNTER — Encounter: Payer: Self-pay | Admitting: Internal Medicine

## 2022-07-22 ENCOUNTER — Other Ambulatory Visit (HOSPITAL_COMMUNITY): Payer: Self-pay

## 2022-07-23 ENCOUNTER — Encounter: Payer: Self-pay | Admitting: Oncology

## 2022-07-23 ENCOUNTER — Ambulatory Visit (INDEPENDENT_AMBULATORY_CARE_PROVIDER_SITE_OTHER): Payer: Medicare Other | Admitting: Internal Medicine

## 2022-07-23 ENCOUNTER — Encounter: Payer: Self-pay | Admitting: Internal Medicine

## 2022-07-23 VITALS — BP 105/50 | HR 71 | Temp 98.3°F | Wt 170.6 lb

## 2022-07-23 DIAGNOSIS — D682 Hereditary deficiency of other clotting factors: Secondary | ICD-10-CM

## 2022-07-23 DIAGNOSIS — Q141 Congenital malformation of retina: Secondary | ICD-10-CM

## 2022-07-23 DIAGNOSIS — H903 Sensorineural hearing loss, bilateral: Secondary | ICD-10-CM

## 2022-07-23 DIAGNOSIS — R252 Cramp and spasm: Secondary | ICD-10-CM

## 2022-07-23 DIAGNOSIS — Z87891 Personal history of nicotine dependence: Secondary | ICD-10-CM

## 2022-07-23 DIAGNOSIS — Z8739 Personal history of other diseases of the musculoskeletal system and connective tissue: Secondary | ICD-10-CM

## 2022-07-23 DIAGNOSIS — N99528 Other complication of other external stoma of urinary tract: Secondary | ICD-10-CM

## 2022-07-23 DIAGNOSIS — C689 Malignant neoplasm of urinary organ, unspecified: Secondary | ICD-10-CM

## 2022-07-23 DIAGNOSIS — K9089 Other intestinal malabsorption: Secondary | ICD-10-CM | POA: Diagnosis not present

## 2022-07-23 DIAGNOSIS — Z8639 Personal history of other endocrine, nutritional and metabolic disease: Secondary | ICD-10-CM

## 2022-07-23 DIAGNOSIS — N179 Acute kidney failure, unspecified: Secondary | ICD-10-CM

## 2022-07-23 DIAGNOSIS — K219 Gastro-esophageal reflux disease without esophagitis: Secondary | ICD-10-CM | POA: Diagnosis not present

## 2022-07-23 DIAGNOSIS — E039 Hypothyroidism, unspecified: Secondary | ICD-10-CM | POA: Diagnosis not present

## 2022-07-23 DIAGNOSIS — H9190 Unspecified hearing loss, unspecified ear: Secondary | ICD-10-CM | POA: Insufficient documentation

## 2022-07-23 DIAGNOSIS — E785 Hyperlipidemia, unspecified: Secondary | ICD-10-CM

## 2022-07-23 DIAGNOSIS — M810 Age-related osteoporosis without current pathological fracture: Secondary | ICD-10-CM

## 2022-07-23 DIAGNOSIS — I10 Essential (primary) hypertension: Secondary | ICD-10-CM

## 2022-07-23 NOTE — Assessment & Plan Note (Signed)
Gradual loss, R>L.  Tms nml, canals not occluded with cerumen.  She will self-refer to the [facility] audiology clinic, her preference.

## 2022-07-23 NOTE — Assessment & Plan Note (Signed)
Due for recheck; she could be at higher risk from chronic PPI.

## 2022-07-23 NOTE — Assessment & Plan Note (Signed)
Recurrence of an old problem.  On no potassium wasting meds.  Hx of significant hypomagnesiumemia repleted IV years ago, hasn't been rechecked.  Will do so today, along with usual electrolytes.

## 2022-07-23 NOTE — Assessment & Plan Note (Signed)
Bmp today to f/u.  Would not be surprised if there is some worsening given her declining blood pressure while on ARB.  Stopping ARB regardless; recheck next visit.

## 2022-07-23 NOTE — Assessment & Plan Note (Signed)
Released to annual f/u at this time.  Aside from problems with urostomy, she is doing very well.

## 2022-07-23 NOTE — Assessment & Plan Note (Signed)
Still having nighttime leaking - positional.  Discussed waterproof bedpad and contacting urostomy clinic again since the problem hasn't resolved.

## 2022-07-23 NOTE — Assessment & Plan Note (Signed)
Overly controlled at 105/50, though fortunately no dizziness or lightheadedness.  She is well hydrated.  BP has been gradually declining and we will now trial OFF the losartan 25 mg.

## 2022-07-23 NOTE — Assessment & Plan Note (Signed)
Continues Prolia; I will assume her refills and monitoring from Dr. Layne Benton.  Vit D level today.  No plan for repeating DXA at this time.

## 2022-07-23 NOTE — Assessment & Plan Note (Signed)
No epigastric pain, but she continues to maintain relief of her associated nighttime cough.  Pantoprazole continues.  She is aware of the association with bone health; is being treated for ostoeporosis.

## 2022-07-23 NOTE — Assessment & Plan Note (Addendum)
No weight gain, cold tendency, increased fatigue.  Due for annual TSH today.

## 2022-07-23 NOTE — Assessment & Plan Note (Signed)
Due to cost, she has transitioned back  to coumadin managed by PharmD Dr. Elie Confer.  No complications.

## 2022-07-23 NOTE — Assessment & Plan Note (Signed)
Rare twince, in R great toe, resolved with an Advil.

## 2022-07-23 NOTE — Assessment & Plan Note (Addendum)
Not taking colestipol for several months!  Loose stool, usually in the morning, tolerated and satisfactory!  Got better after the bladder surgery.  She prefers to maintain colestipol refill availability.

## 2022-07-23 NOTE — Progress Notes (Signed)
Independent and active 77 year old Pamela [name] is here for routine follow-up. 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  Feeling well!  Only complaint is of ostomy leaking at night.  Experiencing some cramps in hands; she recalls very low magnesium levels when beginning her cancer treatments.  Other ROS in associated problem based documentation.  Exam: BP (!) 105/50 (BP Location: Right Arm, Patient Position: Sitting, Cuff Size: Small)   Pulse 71   Temp 98.3 F (36.8 C) (Oral)   Wt 170 lb 9.6 oz (77.4 kg)   SpO2 100%   BMI 29.28 kg/m   Stable brisk gait. Transfers independently onto the exam table. Ear canals patent with minimal cerumen. Heart rrr no murmur, lungs clear, no jvd.  Trace chronic edema, varicose veins r > L non tender.  Incidentally noted occasional mouth tremor, none of hands.  Assessment and plan (problems in no particular order)  GERD (gastroesophageal reflux disease) with nocturnal cough No epigastric pain, but she continues to maintain relief of her associated nighttime cough.  Pantoprazole continues.  She is aware of the association with bone health; is being treated for ostoeporosis.  Bile acid malabsorption syndrome / Diarrhea, resolving over the years. Not taking colestipol for several months!  Loose stool, usually in the morning, tolerated and satisfactory!  Got better after the bladder surgery.  She prefers to maintain colestipol refill availability.  Hypothyroidism No weight gain, cold tendency, or increased fatigue.  Due for annual TSH today.  Osteoporosis, post-menopausal Continues Prolia; I will assume her refills and monitoring from Dr. Layne Benton.  Vit D level today.  No plan for repeating DXA at this time.  History of gout Rare twinge, in R great toe, resolves with an Advil.   Essential hypertension, benign Overly controlled at 105/50, though fortunately no dizziness or lightheadedness.  She  is well hydrated.  BP has been gradually declining and we will now trial OFF the losartan 25 mg.    Hx of urothelial cancer ([place]), treated Released to annual f/u at this time.  Aside from problems with urostomy, she is doing very well.  Acute kidney injury ([place]) Bmp today to f/u.  Would not be surprised if there is some worsening given her declining blood pressure while on ARB.  Stopping ARB regardless; recheck next visit.   History of non anemic vitamin B12 deficiency Due for recheck; she could be at higher risk from chronic PPI.  Complication of Ileal conduit ([place]) Still having nighttime leaking - positional.  Discussed waterproof bedpad and contacting urostomy clinic again since the problem hasn't resolved.    Hearing loss Gradual loss, R>L.  Tms nml, canals not occluded with cerumen.  She will self-refer to the [facility] audiology clinic, her preference.    Factor II deficiency ([place]) Due to cost, she has transitioned back  to coumadin managed by PharmD Dr. Elie Confer.  No complications.  Hand cramps Recurrence of an old problem.  On no potassium wasting meds.  Hx of significant hypomagnesiumemia repleted IV years ago, hasn't been rechecked.  Will do so today, along with usual electrolytes.

## 2022-07-24 LAB — BASIC METABOLIC PANEL
BUN/Creatinine Ratio: 18 (ref 12–28)
BUN: 21 mg/dL (ref 8–27)
CO2: 21 mmol/L (ref 20–29)
Calcium: 9.3 mg/dL (ref 8.7–10.3)
Chloride: 102 mmol/L (ref 96–106)
Creatinine, Ser: 1.19 mg/dL — ABNORMAL HIGH (ref 0.57–1.00)
Glucose: 82 mg/dL (ref 70–99)
Potassium: 5 mmol/L (ref 3.5–5.2)
Sodium: 139 mmol/L (ref 134–144)
eGFR: 47 mL/min/{1.73_m2} — ABNORMAL LOW (ref >59)

## 2022-07-24 LAB — VITAMIN D 25 HYDROXY (VIT D DEFICIENCY, FRACTURES): Vit D, 25-Hydroxy: 19.8 ng/mL — ABNORMAL LOW (ref 30.0–100.0)

## 2022-07-24 LAB — LIPID PANEL
Chol/HDL Ratio: 2.1 ratio (ref 0.0–4.4)
Cholesterol, Total: 213 mg/dL — ABNORMAL HIGH (ref 100–199)
HDL: 100 mg/dL (ref >39)
LDL Chol Calc (NIH): 102 mg/dL — ABNORMAL HIGH (ref 0–99)
Triglycerides: 59 mg/dL (ref 0–149)
VLDL Cholesterol Cal: 11 mg/dL (ref 5–40)

## 2022-07-24 LAB — TSH: TSH: 0.16 u[IU]/mL — ABNORMAL LOW (ref 0.450–4.500)

## 2022-07-24 LAB — MAGNESIUM: Magnesium: 1.1 mg/dL — ABNORMAL LOW (ref 1.6–2.3)

## 2022-07-24 NOTE — Progress Notes (Signed)
Discussed results by phone.  She has already resumed her vit D and Mg.  Mildly low TSH is asymptomatic, we'll recheck at future visit but no changes at this time. Kidney fxn is improved, she will be mindful of adequate water intake.

## 2022-07-27 ENCOUNTER — Other Ambulatory Visit (HOSPITAL_COMMUNITY): Payer: Self-pay | Admitting: Internal Medicine

## 2022-07-27 DIAGNOSIS — Z1231 Encounter for screening mammogram for malignant neoplasm of breast: Secondary | ICD-10-CM

## 2022-07-28 ENCOUNTER — Other Ambulatory Visit (HOSPITAL_COMMUNITY): Payer: Self-pay

## 2022-07-28 ENCOUNTER — Other Ambulatory Visit: Payer: Self-pay

## 2022-08-03 ENCOUNTER — Ambulatory Visit (HOSPITAL_COMMUNITY)
Admission: RE | Admit: 2022-08-03 | Discharge: 2022-08-03 | Disposition: A | Discharge status: Home / Self Care | Payer: Medicare Other | Source: Ambulatory Visit | Attending: Internal Medicine | Admitting: Internal Medicine

## 2022-08-03 ENCOUNTER — Encounter: Payer: Self-pay | Admitting: Oncology

## 2022-08-03 DIAGNOSIS — Z1231 Encounter for screening mammogram for malignant neoplasm of breast: Secondary | ICD-10-CM | POA: Diagnosis not present

## 2022-08-10 ENCOUNTER — Ambulatory Visit (INDEPENDENT_AMBULATORY_CARE_PROVIDER_SITE_OTHER): Payer: Medicare Other | Admitting: Pharmacist

## 2022-08-10 DIAGNOSIS — Z7901 Long term (current) use of anticoagulants: Secondary | ICD-10-CM

## 2022-08-10 DIAGNOSIS — D6869 Other thrombophilia: Secondary | ICD-10-CM

## 2022-08-10 LAB — POCT INR: INR: 2.3 (ref 2.0–3.0)

## 2022-08-10 NOTE — Patient Instructions (Signed)
Patient instructed to take medications as defined in the Anti-coagulation Track section of this encounter.  Patient instructed to take today's dose.  Patient instructed to take one-and-one-half (1 & 1/2) tablets of your '2mg'$  strength, lavender colored warfarin tablets, by mouth, once-daily--EXCEPT on MONDAYS AND THURSDAYS, TAKE TWO (2) tablets on Mondays and Thursdays.  Patient verbalized understanding of these instructions.

## 2022-08-10 NOTE — Progress Notes (Signed)
Anticoagulation Management Pamela [name] is a 77 y.o. female who reports to the clinic for monitoring of warfarin treatment.    Indication:  Secondary hypercoagulable state; long term current use of oral anticoagulant, warfarin. Target INR range 2.0 - 3.0.   Duration: indefinite Supervising physician:  Lottie Mussel, MD  Anticoagulation Clinic Visit History: Patient does not report signs/symptoms of bleeding or thromboembolism  Other recent changes: No diet, medications, lifestyle changes cited by the patient at this visit.  Anticoagulation Episode Summary     Current INR goal:  2.0-3.0  TTR:  62.0 % (11.6 y)  Next INR check:  09/21/2022  INR from last check:  2.3 (08/10/2022)  Weekly max warfarin dose:    Target end date:  Indefinite  INR check location:  Anticoagulation Clinic  Preferred lab:    Send INR reminders to:  [facility]   Indications   Secondary hypercoagulable state ([place]); hx of recurrent DVTs [D68.69] DVT HX OF (Resolved) [Z86.718] Long term (current) use of anticoagulants [Z79.01]        Comments:          Anticoagulation Care Providers     Provider Role Specialty Phone number   Burman Freestone, MD  Internal Medicine [phone]       Allergies  Allergen Reactions   Ace Inhibitors Cough    Current Outpatient Medications:    colestipol (COLESTID) 1 g tablet, Take up to 6 tablets by mouth as needed no more than once daily for chronic biliary diarrhea, Disp: 180 tablet, Rfl: 3   denosumab (PROLIA) 60 MG/ML SOSY injection, Inject 60 mg into the skin every 6 (six) months., Disp: , Rfl:    levothyroxine (SYNTHROID) 88 MCG tablet, Take 1 tablet (88 mcg total) by mouth daily before breakfast., Disp: 90 tablet, Rfl: 3   Multiple Vitamins-Minerals (ALGAE BASED CALCIUM) TABS, Take 500 mg by mouth daily., Disp: , Rfl:    pantoprazole (PROTONIX) 20 MG tablet, Take 1 tablet (20 mg total) by mouth daily., Disp: 90 tablet, Rfl: 3   warfarin (COUMADIN) 2  MG tablet, Take one-and-one-half (1 & 1/2) tablets by mouth, once-daily., Disp: 44 tablet, Rfl: 3 Past Medical History:  Diagnosis Date   Age-related nuclear cataract, bilateral 07/14/2018   Anemia    Anemia requiring transfusions 06/03/2006   Required post operative transfusion 01/2020. Mixed anemia with hx of macrocytic B12 deficiency anemia.    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 Percell Miller with [facility] is following.   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       Current Social History 10/18/2019        Patient lives alone in a 2 level home where laundry is in the basement. There is one step without handrail up to the entrance patient uses.      Patient's method of transportation is personal car.      The highest level of education was advanced degree; Master's in Education.      The patient currently retired.      Identified important Relationships are My daughter, good friend, Manuela Schwartz who is starting with dementia. Several women from church and [place].      Pets : None       Interests / Fun: Read a lot, take walks, [employer] with church groups and [place] groups.       Current Stressors: "Cancer. And unable to get knee replacement last year 2/2 Covid pandemic and this year 2/2 Cancer. Hair is starting to fall out 2/2 chemo"      Religious / Personal Beliefs: "Episcopalian, Christian. I believe in God and [place]."       L. Ducatte, BSN, RN-BC       Social Determinants of Health   Financial Resource Strain: Not on file  Food Insecurity: No Food Insecurity (03/24/2022)   Hunger Vital Sign    Worried About Running Out of Food in the Last Year: Never true    [place] in the Last Year: Never true  Transportation Needs: No Transportation Needs (03/24/2022)   PRAPARE - Hydrologist (Medical): No    Lack of Transportation (Non-Medical): No  Physical Activity: Not on file  Stress: Not on file  Social Connections: Not on file   Family History  Problem Relation Age of Onset   Cancer Mother        H&N, smoker   Liver disease Mother    Cancer Sister        lung, 2011, smoker   Other Other        grandmother had mastectomy in her 22's unknown reason   Macular degeneration Father    Diverticulitis Father    Breast cancer Neg Hx     ASSESSMENT Recent Results: The most recent result is correlated with 23 mg per week: Lab Results  Component Value  Date   INR 2.3 08/10/2022   INR 1.70 07/13/2022   INR 2.1 06/01/2022    Anticoagulation Dosing: Description   Take one-and-one-half (1 & 1/2) tablets of your '2mg'$  strength, lavender colored warfarin tablets, by mouth, once-daily--EXCEPT on MONDAYS AND THURSDAYS, TAKE TWO (2) tablets on Mondays  and Thursdays.      INR today: Therapeutic  PLAN Weekly dose was unchanged.  Patient Instructions  Patient instructed to take medications as defined in the Anti-coagulation Track section of this encounter.  Patient instructed to take today's dose.  Patient instructed to take one-and-one-half (1 & 1/2) tablets of your '2mg'$  strength, lavender colored warfarin tablets, by mouth, once-daily--EXCEPT on MONDAYS AND THURSDAYS, TAKE TWO (2) tablets on Mondays and Thursdays.  Patient verbalized understanding of these instructions.  Patient advised to contact clinic or seek medical attention if signs/symptoms of bleeding or thromboembolism occur.  Patient verbalized understanding by repeating back information and was advised to contact me if further medication-related questions arise. Patient was also provided an information handout.  Follow-up Return in 6 weeks (on 09/21/2022) for Follow up INR.  Pennie Banter, PharmD, CPP  15 minutes spent face-to-face with the patient during the encounter. 50% of time spent on education, including signs/sx bleeding and clotting, as well as food and drug interactions with warfarin. 50% of time was spent on fingerprick POC INR sample collection,processing, results determination, and documentation in http://www.kim.net/.

## 2022-08-25 ENCOUNTER — Other Ambulatory Visit: Payer: Self-pay

## 2022-08-25 DIAGNOSIS — C689 Malignant neoplasm of urinary organ, unspecified: Secondary | ICD-10-CM

## 2022-09-01 ENCOUNTER — Encounter: Payer: Self-pay | Admitting: Oncology

## 2022-09-01 ENCOUNTER — Other Ambulatory Visit (HOSPITAL_COMMUNITY): Payer: Self-pay

## 2022-09-01 MED ORDER — COLCHICINE 0.6 MG PO TABS
0.6000 mg | ORAL_TABLET | Freq: Every day | ORAL | 1 refills | Status: DC
  Filled 2022-09-01: qty 6, 6d supply, fill #0
  Filled 2022-09-17: qty 6, 6d supply, fill #1

## 2022-09-01 MED ORDER — VITAMIN D (ERGOCALCIFEROL) 1.25 MG (50000 UNIT) PO CAPS
50000.0000 [IU] | ORAL_CAPSULE | ORAL | 0 refills | Status: DC
  Filled 2022-09-01: qty 13, 91d supply, fill #0

## 2022-09-02 ENCOUNTER — Telehealth: Payer: Self-pay | Admitting: *Deleted

## 2022-09-02 NOTE — Telephone Encounter (Signed)
Call from Dr. Layne Benton- Orthopedic doctor for patient.  Will be unable to continue to do Prolia injections for patient in her office.   Would like to speak with patient's PCP Dr. Jimmye Norman about an alternative way for patient to receive her Porlia injections.  Patient mentioned that she would be able to get them in.  Patient is not due for right now. Would like for Dr. Jimmye Norman to give her a call at [phone] to discuss options.

## 2022-09-07 ENCOUNTER — Ambulatory Visit (INDEPENDENT_AMBULATORY_CARE_PROVIDER_SITE_OTHER): Payer: Medicare Other | Admitting: Pharmacist

## 2022-09-07 DIAGNOSIS — Z7901 Long term (current) use of anticoagulants: Secondary | ICD-10-CM | POA: Diagnosis not present

## 2022-09-07 DIAGNOSIS — D6869 Other thrombophilia: Secondary | ICD-10-CM

## 2022-09-07 LAB — POCT INR: INR: 4 — AB (ref 2.0–3.0)

## 2022-09-07 NOTE — Patient Instructions (Signed)
Patient instructed to take medications as defined in the Anti-coagulation Track section of this encounter.  Patient instructed to take today's dose.  Patient instructed to take one-and-one-half (1 & 1/2) tablets of your '2mg'$  strength, lavender colored warfarin tablets, by mouth, once-daily--EXCEPT on TUESDAYS, TAKE ONLY ONE (1) tablet on Tuesdays.   Patient verbalized understanding of these instructions.

## 2022-09-07 NOTE — Progress Notes (Signed)
Anticoagulation Management Pamela [name] is a 77 y.o. female who reports to the clinic for monitoring of warfarin treatment.    Indication:  Secondary hypercoagulable state with history of VTE; long term current use of oral anticoagulant, warfarin to maintain INR target range 2.0 - 3.0.    Duration: indefinite Supervising physician: Gilles Chiquito  Anticoagulation Clinic Visit History: Patient does not report signs/symptoms of bleeding or thromboembolism  Other recent changes: No diet, medications, lifestyle except as noted in patient findings.  Anticoagulation Episode Summary     Current INR goal:  2.0-3.0  TTR:  61.8 % (11.7 y)  Next INR check:  09/28/2022  INR from last check:  4.0 (09/07/2022)  Weekly max warfarin dose:    Target end date:  Indefinite  INR check location:  Anticoagulation Clinic  Preferred lab:    Send INR reminders to:  [facility]   Indications   Secondary hypercoagulable state ([place]); hx of recurrent DVTs [D68.69] DVT HX OF (Resolved) [Z86.718] Long term (current) use of anticoagulants [Z79.01]        Comments:          Anticoagulation Care Providers     Provider Role Specialty Phone number   Burman Freestone, MD  Internal Medicine [phone]       Allergies  Allergen Reactions   Ace Inhibitors Cough    Current Outpatient Medications:    colchicine 0.6 MG tablet, Take 1 tablet (0.6 mg total) by mouth daily., Disp: 6 tablet, Rfl: 1   colestipol (COLESTID) 1 g tablet, Take up to 6 tablets by mouth as needed no more than once daily for chronic biliary diarrhea, Disp: 180 tablet, Rfl: 3   denosumab (PROLIA) 60 MG/ML SOSY injection, Inject 60 mg into the skin every 6 (six) months., Disp: , Rfl:    levothyroxine (SYNTHROID) 88 MCG tablet, Take 1 tablet (88 mcg total) by mouth daily before breakfast., Disp: 90 tablet, Rfl: 3   Multiple Vitamins-Minerals (ALGAE BASED CALCIUM) TABS, Take 500 mg by mouth daily., Disp: , Rfl:    pantoprazole  (PROTONIX) 20 MG tablet, Take 1 tablet (20 mg total) by mouth daily., Disp: 90 tablet, Rfl: 3   Vitamin D, Ergocalciferol, (DRISDOL) 1.25 MG (50000 UNIT) CAPS capsule, Take 1 capsule (50,000 Units total) by mouth once a week., Disp: 13 capsule, Rfl: 0   warfarin (COUMADIN) 2 MG tablet, Take one-and-one-half (1 & 1/2) tablets by mouth, once-daily., Disp: 44 tablet, Rfl: 3 Past Medical History:  Diagnosis Date   Age-related nuclear cataract, bilateral 07/14/2018   Anemia    Anemia requiring transfusions 06/03/2006   Required post operative transfusion 01/2020. Mixed anemia with hx of macrocytic B12 deficiency anemia.    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 Percell Miller with [facility] is following.   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       Current Social History 10/18/2019        Patient lives alone in a 2 level home where laundry is in the basement. There is one step without handrail up to the entrance patient uses.      Patient's method of transportation is personal car.      The highest level of education was advanced degree; Master's in Education.      The patient currently retired.      Identified important Relationships are My daughter, good friend, Manuela Schwartz who is starting with dementia. Several women from church and [place].      Pets : None       Interests / Fun: Read a lot, take walks, [employer] with church groups and [place] groups.       Current Stressors: "Cancer. And unable to get knee replacement last year 2/2 Covid pandemic and this year 2/2 Cancer. Hair is starting to fall out 2/2 chemo"      Religious / Personal Beliefs: "Episcopalian, Christian. I believe in God and [place]."       L. Ducatte, BSN, RN-BC       Social Determinants of Health   Financial Resource Strain: Not on file  Food Insecurity: No Food Insecurity (03/24/2022)   Hunger Vital Sign    Worried About Running Out of Food in the Last Year: Never true    [place] in the Last Year: Never true  Transportation Needs: No Transportation Needs (03/24/2022)   PRAPARE - Hydrologist (Medical): No    Lack of Transportation (Non-Medical): No  Physical Activity: Not on file  Stress: Not on file  Social Connections: Not on file   Family History  Problem Relation Age of Onset   Cancer Mother        H&N, smoker   Liver disease Mother    Cancer Sister        lung, 2011, smoker    Other Other        grandmother had mastectomy in her 27's unknown reason   Macular degeneration Father    Diverticulitis Father    Breast cancer Neg Hx     ASSESSMENT Recent Results: The most recent result is correlated with 23 mg per week: Lab Results  Component Value  Date   INR 4.0 (A) 09/07/2022   INR 2.3 08/10/2022   INR 1.70 07/13/2022    Anticoagulation Dosing: Description   Take one-and-one-half (1 & 1/2) tablets of your '2mg'$  strength, lavender colored warfarin tablets, by mouth, once-daily--EXCEPT on TUESDAYS, TAKE ONLY ONE (1) tablet on Tuesdays.       INR today: Supratherapeutic  PLAN Weekly dose was decreased by 13% to 20 mg per week  Patient Instructions  Patient instructed to take medications as defined in the Anti-coagulation Track section of this encounter.  Patient instructed to take today's dose.  Patient instructed to take one-and-one-half (1 & 1/2) tablets of your '2mg'$  strength, lavender colored warfarin tablets, by mouth, once-daily--EXCEPT on TUESDAYS, TAKE ONLY ONE (1) tablet on Tuesdays.   Patient verbalized understanding of these instructions.  Patient advised to contact clinic or seek medical attention if signs/symptoms of bleeding or thromboembolism occur.  Patient verbalized understanding by repeating back information and was advised to contact me if further medication-related questions arise. Patient was also provided an information handout.  Follow-up Return in 3 weeks (on 09/28/2022) for Follow up INR.  Pennie Banter, PharmD, CPP  15 minutes spent face-to-face with the patient during the encounter. 50% of time spent on education, including signs/sx bleeding and clotting, as well as food and drug interactions with warfarin. 50% of time was spent on fingerprick POC INR sample collection,processing, results determination, and documentation in [email or web address].kim.net/.

## 2022-09-21 ENCOUNTER — Ambulatory Visit: Payer: Medicare Other

## 2022-09-28 ENCOUNTER — Ambulatory Visit (INDEPENDENT_AMBULATORY_CARE_PROVIDER_SITE_OTHER): Payer: Medicare Other | Admitting: Pharmacist

## 2022-09-28 DIAGNOSIS — D6869 Other thrombophilia: Secondary | ICD-10-CM

## 2022-09-28 DIAGNOSIS — Z7901 Long term (current) use of anticoagulants: Secondary | ICD-10-CM | POA: Diagnosis not present

## 2022-09-28 LAB — POCT INR: INR: 3.9 — AB (ref 2.0–3.0)

## 2022-09-28 NOTE — Patient Instructions (Signed)
Patient instructed to take medications as defined in the Anti-coagulation Track section of this encounter.  Patient instructed to take today's dose.  Patient instructed to take one-and-one-half (1 & 1/2) tablets of your 2mg  strength, lavender colored warfarin tablets, by mouth, once-daily--EXCEPT on MONDAYS, WEDNESDAYS and FRIDAYS--take ONLY ONE (1) tablet on these days.  Patient verbalized understanding of these instructions.

## 2022-09-28 NOTE — Progress Notes (Signed)
Anticoagulation Management Pamela [name] is a 77 y.o. female who reports to the clinic for monitoring of warfarin treatment.    Indication:  Secondary hypercoagulable state; History of VTE with recurrences; Long term current use of oral anticoagulation with warfarin; Target INR range 2.0 - 3.0.   Duration: indefinite Supervising physician:  Velna Ochs, MD  Anticoagulation Clinic Visit History: Patient does not report signs/symptoms of bleeding or thromboembolism  Other recent changes: No diet, medications, lifestyle changes except as noted in patient findings.  Anticoagulation Episode Summary     Current INR goal:  2.0-3.0  TTR:  61.5 % (11.8 y)  Next INR check:  10/19/2022  INR from last check:  3.9 (09/28/2022)  Weekly max warfarin dose:    Target end date:  Indefinite  INR check location:  Anticoagulation Clinic  Preferred lab:    Send INR reminders to:  [facility]   Indications   Secondary hypercoagulable state ([place]); hx of recurrent DVTs [D68.69] DVT HX OF (Resolved) [Z86.718] Long term (current) use of anticoagulants [Z79.01]        Comments:          Anticoagulation Care Providers     Provider Role Specialty Phone number   Burman Freestone, MD  Internal Medicine [phone]       Allergies  Allergen Reactions   Ace Inhibitors Cough    Current Outpatient Medications:    colchicine 0.6 MG tablet, Take 1 tablet (0.6 mg total) by mouth daily., Disp: 6 tablet, Rfl: 1   colestipol (COLESTID) 1 g tablet, Take up to 6 tablets by mouth as needed no more than once daily for chronic biliary diarrhea, Disp: 180 tablet, Rfl: 3   denosumab (PROLIA) 60 MG/ML SOSY injection, Inject 60 mg into the skin every 6 (six) months., Disp: , Rfl:    levothyroxine (SYNTHROID) 88 MCG tablet, Take 1 tablet (88 mcg total) by mouth daily before breakfast., Disp: 90 tablet, Rfl: 3   Multiple Vitamins-Minerals (ALGAE BASED CALCIUM) TABS, Take 500 mg by mouth daily., Disp: ,  Rfl:    pantoprazole (PROTONIX) 20 MG tablet, Take 1 tablet (20 mg total) by mouth daily., Disp: 90 tablet, Rfl: 3   Vitamin D, Ergocalciferol, (DRISDOL) 1.25 MG (50000 UNIT) CAPS capsule, Take 1 capsule (50,000 Units total) by mouth once a week., Disp: 13 capsule, Rfl: 0   warfarin (COUMADIN) 2 MG tablet, Take one-and-one-half (1 & 1/2) tablets by mouth, once-daily., Disp: 44 tablet, Rfl: 3 Past Medical History:  Diagnosis Date   Age-related nuclear cataract, bilateral 07/14/2018   Anemia    Anemia requiring transfusions 06/03/2006   Required post operative transfusion 01/2020. Mixed anemia with hx of macrocytic B12 deficiency anemia.    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 Percell Miller with [facility] is following.   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    Employer: [employer]: Retired in 2011  Tobacco Use   Smoking status: Former    Packs/day: 1.00    Years: 20.00    Additional pack years: 0.00    Total pack years: 20.00    Types: Cigarettes    Quit date: 07/06/1985    Years since quitting: 37.2   Smokeless  tobacco: Never   Tobacco comments:    Quit 1988  Vaping Use   Vaping Use: Never used  Substance and Sexual Activity   Alcohol use: Yes    Comment: 2 glasses of wine daily    Drug use: No   Sexual activity: Not on file  Other Topics Concern   Not on file  Social History Narrative   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       Current Social History 10/18/2019        Patient lives alone in a 2 level home where laundry is in the basement. There is one step without handrail up to the entrance patient uses.      Patient's method of transportation is personal car.      The highest level of education was advanced degree; Master's in Education.      The patient currently retired.      Identified important Relationships are My daughter, good friend, Manuela Schwartz who is starting with dementia. Several women from church and [place].      Pets : None       Interests / Fun: Read a lot, take walks, [employer] with church groups and [place] groups.       Current Stressors: "Cancer. And unable to get knee replacement last year 2/2 Covid pandemic and this year 2/2 Cancer. Hair is starting to fall out 2/2 chemo"      Religious / Personal Beliefs: "Episcopalian, Christian. I believe in God and [place]."       L. Ducatte, BSN, RN-BC       Social Determinants of Health   Financial Resource Strain: Not on file  Food Insecurity: No Food Insecurity (03/24/2022)   Hunger Vital Sign    Worried About Running Out of Food in the Last Year: Never true    [place] in the Last Year: Never true  Transportation Needs: No Transportation Needs (03/24/2022)   PRAPARE - Hydrologist (Medical): No    Lack of Transportation (Non-Medical): No  Physical Activity: Not on file  Stress: Not on file  Social Connections: Not on file   Family History  Problem Relation Age of Onset   Cancer Mother        H&N, smoker   Liver disease  Mother    Cancer Sister        lung, 2011, smoker   Other Other        grandmother had mastectomy in her 33's unknown reason   Macular degeneration Father    Diverticulitis Father    Breast cancer Neg Hx     ASSESSMENT Recent Results: The most recent result is correlated with 20 mg  per week: Lab Results  Component Value Date   INR 3.9 (A) 09/28/2022   INR 4.0 (A) 09/07/2022   INR 2.3 08/10/2022    Anticoagulation Dosing: Description   Take one-and-one-half (1 & 1/2) tablets of your 2mg  strength, lavender colored warfarin tablets, by mouth, once-daily--EXCEPT on MONDAYS, WEDNESDAYS and FRIDAYS--take ONLY ONE (1) tablet on these days.      INR today: Supratherapeutic  PLAN Weekly dose was decreased by 10% to 18 mg per week  Patient Instructions  Patient instructed to take medications as defined in the Anti-coagulation Track section of this encounter.  Patient instructed to take today's dose.  Patient instructed to take one-and-one-half (1 & 1/2) tablets of your 2mg  strength, lavender colored warfarin tablets, by mouth, once-daily--EXCEPT on MONDAYS, WEDNESDAYS and FRIDAYS--take ONLY ONE (1) tablet on these days.  Patient verbalized understanding of these instructions.  Patient advised to contact clinic or seek medical attention if signs/symptoms of bleeding or thromboembolism occur.  Patient verbalized understanding by repeating back information and was advised to contact me if further medication-related questions arise. Patient was also provided an information handout.  Follow-up Return in 3 weeks (on 10/19/2022) for Follow up INR.  Pennie Banter, PharmD, CPP  15 minutes spent face-to-face with the patient during the encounter. 50% of time spent on education, including signs/sx bleeding and clotting, as well as food and drug interactions with warfarin. 50% of time was spent on fingerprick POC INR sample collection,processing, results determination, and documentation in  [email or web address]

## 2022-10-07 ENCOUNTER — Encounter: Payer: Self-pay | Admitting: Internal Medicine

## 2022-10-16 ENCOUNTER — Other Ambulatory Visit: Payer: Self-pay | Admitting: Internal Medicine

## 2022-10-19 ENCOUNTER — Ambulatory Visit (INDEPENDENT_AMBULATORY_CARE_PROVIDER_SITE_OTHER): Payer: Medicare Other | Admitting: Pharmacist

## 2022-10-19 DIAGNOSIS — Z7901 Long term (current) use of anticoagulants: Secondary | ICD-10-CM | POA: Diagnosis not present

## 2022-10-19 DIAGNOSIS — D6869 Other thrombophilia: Secondary | ICD-10-CM | POA: Diagnosis not present

## 2022-10-19 LAB — POCT INR: INR: 3.5 — AB (ref 2.0–3.0)

## 2022-10-19 NOTE — Progress Notes (Signed)
Anticoagulation Management Pamela [name] is a 77 y.o. female who reports to the clinic for monitoring of warfarin treatment.    Indication:  Secondary hypercoagulable state, history of VTE, Long term current use of oral anticoagulation with warfarin, target INR range 2.0 - 3.0   Duration: indefinite Supervising physician: Carlynn Purl  Anticoagulation Clinic Visit History: Patient does not report signs/symptoms of bleeding or thromboembolism  Other recent changes: No diet, medications, lifestyle changes endorsed by the patient at this visit.  Anticoagulation Episode Summary     Current INR goal:  2.0-3.0  TTR:  61.2 % (11.8 y)  Next INR check:  11/09/2022  INR from last check:  3.5 (10/19/2022)  Weekly max warfarin dose:    Target end date:  Indefinite  INR check location:  Anticoagulation Clinic  Preferred lab:    Send INR reminders to:  [facility]   Indications   Secondary hypercoagulable state (HCC); hx of recurrent DVTs [D68.69] DVT HX OF (Resolved) [Z86.718] Long term (current) use of anticoagulants [Z79.01]        Comments:          Anticoagulation Care Providers     Provider Role Specialty Phone number   Zoila Shutter, MD  Internal Medicine [phone]       Allergies  Allergen Reactions   Ace Inhibitors Cough    Current Outpatient Medications:    colchicine 0.6 MG tablet, Take 1 tablet (0.6 mg total) by mouth daily., Disp: 6 tablet, Rfl: 1   colestipol (COLESTID) 1 g tablet, Take up to 6 tablets by mouth as needed no more than once daily for chronic biliary diarrhea, Disp: 180 tablet, Rfl: 3   denosumab (PROLIA) 60 MG/ML SOSY injection, Inject 60 mg into the skin every 6 (six) months., Disp: , Rfl:    levothyroxine (SYNTHROID) 88 MCG tablet, Take 1 tablet (88 mcg total) by mouth daily before breakfast., Disp: 90 tablet, Rfl: 3   Multiple Vitamins-Minerals (ALGAE BASED CALCIUM) TABS, Take 500 mg by mouth daily., Disp: , Rfl:    pantoprazole  (PROTONIX) 20 MG tablet, Take 1 tablet (20 mg total) by mouth daily., Disp: 90 tablet, Rfl: 3   Vitamin D, Ergocalciferol, (DRISDOL) 1.25 MG (50000 UNIT) CAPS capsule, Take 1 capsule (50,000 Units total) by mouth once a week., Disp: 13 capsule, Rfl: 0   warfarin (COUMADIN) 2 MG tablet, Take one-and-one-half (1 & 1/2) tablets by mouth, once-daily., Disp: 44 tablet, Rfl: 3 Past Medical History:  Diagnosis Date   Age-related nuclear cataract, bilateral 07/14/2018   Anemia    Anemia requiring transfusions 06/03/2006   Required post operative transfusion 01/2020. Mixed anemia with hx of macrocytic B12 deficiency anemia.    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 Eulah Pont with [facility] is following.   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    Employer: [employer]    Comment: Retired in 2011  Tobacco Use   Smoking status: Former    Packs/day: 1.00    Years: 20.00    Additional pack years: 0.00    Total pack years: 20.00    Types: Cigarettes    Quit date: 07/06/1985    Years since quitting: 37.3   Smokeless tobacco: Never    Tobacco comments:    Quit 1988  Vaping Use   Vaping Use: Never used  Substance and Sexual Activity   Alcohol use: Yes    Comment: 2 glasses of wine daily    Drug use: No   Sexual activity: Not on file  Other Topics Concern   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       Current Social History 10/18/2019        Patient lives alone in a 2 level home where laundry is in the basement. There is one step without handrail up to the entrance patient uses.      Patient's method of transportation is personal car.      The highest level of education was advanced degree; Master's in Education.      The patient currently retired.      Identified important Relationships are My daughter, good friend, Darl Pikes who is starting with dementia. Several women from church and [employer].      Pets : None       Interests / Fun: Read a lot, take walks, [employer] with church groups and [employer] groups.       Current Stressors: "Cancer. And unable to get knee replacement last year 2/2 Covid pandemic and this year 2/2 Cancer. Hair is starting to fall out 2/2 chemo"      Religious / Personal Beliefs: "Episcopalian, Christian. I believe in God and Heaven and the Trinity."       L. Ducatte, BSN, RN-BC       Social Determinants of Health   Financial Resource Strain: Not on file  Food Insecurity: No Food Insecurity (03/24/2022)   Hunger Vital Sign    Worried About Running Out of Food in the Last Year: Never true    Ran Out of Food in the Last Year: Never true  Transportation Needs: No Transportation Needs (03/24/2022)   PRAPARE - Administrator, Civil Service (Medical): No    Lack of Transportation (Non-Medical): No  Physical Activity: Not on file  Stress: Not on file  Social Connections: Not on file   Family History  Problem Relation Age of Onset   Cancer Mother        H&N, smoker   Liver disease Mother    Cancer  Sister        lung, 2011, smoker   Other Other        grandmother had mastectomy in her 69's unknown reason   Macular degeneration Father    Diverticulitis Father    Breast cancer Neg Hx     ASSESSMENT Recent Results: The most recent result is correlated with 18 mg per  week: Lab Results  Component Value Date   INR 3.5 (A) 10/19/2022   INR 3.9 (A) 09/28/2022   INR 4.0 (A) 09/07/2022    Anticoagulation Dosing: Description   Take one-and-one-half (1 & 1/2) tablets of your 2mg  strength, lavender colored warfarin tablets, by mouth, once-daily on SUNDAYS. All other days,take ONLY ONE (1) tablet.     INR today: Subtherapeutic  PLAN Weekly dose was decreased by 17% to 15 mg per week  Patient Instructions  Patient instructed to take medications as defined in the Anti-coagulation Track section of this encounter.  Patient instructed to take today's dose.  Patient instructed to take one-and-one-half (1 & 1/2) tablets of your 2mg  strength, lavender colored warfarin tablets, by mouth, once-daily on SUNDAYS. All other days,take ONLY ONE (1) tablet. Patient verbalized understanding of these instructions.  Patient advised to contact clinic or seek medical attention if signs/symptoms of bleeding or thromboembolism occur.  Patient verbalized understanding by repeating back information and was advised to contact me if further medication-related questions arise. Patient was also provided an information handout.  Follow-up Return in about 3 weeks (around 11/09/2022) for Follow up INR.  Elicia Lamp, PharmD, CPP  15 minutes spent face-to-face with the patient during the encounter. 50% of time spent on education, including signs/sx bleeding and clotting, as well as food and drug interactions with warfarin. 50% of time was spent on fingerprick POC INR sample collection,processing, results determination, and documentation in [email or web address].

## 2022-10-19 NOTE — Patient Instructions (Signed)
Patient instructed to take medications as defined in the Anti-coagulation Track section of this encounter.  Patient instructed to take today's dose.  Patient instructed to take one-and-one-half (1 & 1/2) tablets of your 2mg  strength, lavender colored warfarin tablets, by mouth, once-daily on SUNDAYS. All other days,take ONLY ONE (1) tablet. Patient verbalized understanding of these instructions.

## 2022-10-23 ENCOUNTER — Other Ambulatory Visit: Payer: Self-pay | Admitting: Pharmacist

## 2022-10-23 NOTE — Telephone Encounter (Signed)
Next appt scheduled  6/27 with PCP. 

## 2022-10-29 ENCOUNTER — Other Ambulatory Visit: Payer: Self-pay | Admitting: Pharmacist

## 2022-10-29 ENCOUNTER — Other Ambulatory Visit (HOSPITAL_COMMUNITY): Payer: Self-pay

## 2022-10-29 MED ORDER — WARFARIN SODIUM 2 MG PO TABS
2.0000 mg | ORAL_TABLET | Freq: Every day | ORAL | 3 refills | Status: DC
  Filled 2022-10-29: qty 28, 28d supply, fill #0
  Filled 2022-11-27: qty 28, 28d supply, fill #1
  Filled 2022-12-24: qty 28, 28d supply, fill #2
  Filled 2023-01-12: qty 28, 28d supply, fill #3

## 2022-11-02 ENCOUNTER — Other Ambulatory Visit (HOSPITAL_COMMUNITY): Payer: Self-pay

## 2022-11-09 ENCOUNTER — Ambulatory Visit (INDEPENDENT_AMBULATORY_CARE_PROVIDER_SITE_OTHER): Payer: Medicare Other | Admitting: Pharmacist

## 2022-11-09 DIAGNOSIS — D6869 Other thrombophilia: Secondary | ICD-10-CM | POA: Diagnosis not present

## 2022-11-09 DIAGNOSIS — Z7901 Long term (current) use of anticoagulants: Secondary | ICD-10-CM

## 2022-11-09 LAB — POCT INR: INR: 2.2 (ref 2.0–3.0)

## 2022-11-09 NOTE — Progress Notes (Signed)
Anticoagulation Management Pamela [name] is a 77 y.o. female who reports to the clinic for monitoring of warfarin treatment.    Indication:  Hypercoagulable state, history of DVT, Long term current use of oral anticoagulant, warfarin with target INR 2.0 - 3.0.   Duration: indefinite Supervising physician:  Reymundo Poll, MD  Anticoagulation Clinic Visit History: Patient does not report signs/symptoms of bleeding or thromboembolism  Other recent changes: No diet, medications, lifestyle changes per patient.  Anticoagulation Episode Summary     Current INR goal:  2.0-3.0  TTR:  61.2 % (11.9 y)  Next INR check:  12/07/2022  INR from last check:  2.2 (11/09/2022)  Weekly max warfarin dose:    Target end date:  Indefinite  INR check location:  Anticoagulation Clinic  Preferred lab:    Send INR reminders to:  [facility]   Indications   Secondary hypercoagulable state (HCC); hx of recurrent DVTs [D68.69] DVT HX OF (Resolved) [Z86.718] Long term (current) use of anticoagulants [Z79.01]        Comments:          Anticoagulation Care Providers     Provider Role Specialty Phone number   Zoila Shutter, MD  Internal Medicine [phone]       Allergies  Allergen Reactions   Ace Inhibitors Cough    Current Outpatient Medications:    colchicine 0.6 MG tablet, Take 1 tablet (0.6 mg total) by mouth daily., Disp: 6 tablet, Rfl: 1   colestipol (COLESTID) 1 g tablet, Take up to 6 tablets by mouth as needed no more than once daily for chronic biliary diarrhea, Disp: 180 tablet, Rfl: 3   denosumab (PROLIA) 60 MG/ML SOSY injection, Inject 60 mg into the skin every 6 (six) months., Disp: , Rfl:    levothyroxine (SYNTHROID) 88 MCG tablet, Take 1 tablet (88 mcg total) by mouth daily before breakfast., Disp: 90 tablet, Rfl: 3   Multiple Vitamins-Minerals (ALGAE BASED CALCIUM) TABS, Take 500 mg by mouth daily., Disp: , Rfl:    pantoprazole (PROTONIX) 20 MG tablet, Take 1 tablet  (20 mg total) by mouth daily., Disp: 90 tablet, Rfl: 3   Vitamin D, Ergocalciferol, (DRISDOL) 1.25 MG (50000 UNIT) CAPS capsule, Take 1 capsule (50,000 Units total) by mouth once a week., Disp: 13 capsule, Rfl: 0   warfarin (COUMADIN) 2 MG tablet, Take 1 tablet (2 mg total) by mouth daily at 4 PM., Disp: 28 tablet, Rfl: 3 Past Medical History:  Diagnosis Date   Age-related nuclear cataract, bilateral 07/14/2018   Anemia    Anemia requiring transfusions 06/03/2006   Required post operative transfusion 01/2020. Mixed anemia with hx of macrocytic B12 deficiency anemia.    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 Eulah Pont [facility] is following.   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    Employer: [employer]    Comment: Retired in 2011  Tobacco Use   Smoking status: Former    Packs/day: 1.00    Years: 20.00    Additional pack years: 0.00    Total pack years: 20.00    Types: Cigarettes    Quit date: 07/06/1985    Years since quitting: 37.3   Smokeless tobacco: Never   Tobacco comments:    Quit 1988  Vaping Use   Vaping  Use: Never used  Substance and Sexual Activity   Alcohol use: Yes    Comment: 2 glasses of wine daily    Drug use: No   Sexual activity: Not on file  Other Topics Concern   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       Current Social History 10/18/2019        Patient lives alone in a 2 level home where laundry is in the basement. There is one step without handrail up to the entrance patient uses.      Patient's method of transportation is personal car.      The highest level of education was advanced degree; Master's in Education.      The patient currently retired.      Identified important Relationships are My daughter, good friend, Darl Pikes who is starting with dementia. Several women from church and [employer].      Pets : None       Interests / Fun: Read a lot, take walks, [employer] with church groups and [employer] groups.       Current Stressors: "Cancer. And unable to get knee replacement last year 2/2 Covid pandemic and this year 2/2 Cancer. Hair is starting to fall out 2/2 chemo"      Religious / Personal Beliefs: "Episcopalian, Christian. I believe in God and Heaven and the Trinity."       L. Ducatte, BSN, RN-BC       Social Determinants of Health   Financial Resource Strain: Not on file  Food Insecurity: No Food Insecurity (03/24/2022)   Hunger Vital Sign    Worried About Running Out of Food in the Last Year: Never true    Ran Out of Food in the Last Year: Never true  Transportation Needs: No Transportation Needs (03/24/2022)   PRAPARE - Administrator, Civil Service (Medical): No    Lack of Transportation (Non-Medical): No  Physical Activity: Not on file  Stress: Not on file  Social Connections: Not on file   Family History  Problem Relation Age of Onset   Cancer Mother        H&N, smoker   Liver disease Mother    Cancer Sister        lung, 2011, smoker   Other Other         grandmother had mastectomy in her 44's unknown reason   Macular degeneration Father    Diverticulitis Father    Breast cancer Neg Hx     ASSESSMENT Recent Results: The most recent result is correlated with 15 mg per week: Lab Results  Component Value Date   INR 2.2 11/09/2022   INR 3.5 (A) 10/19/2022   INR 3.9 (A) 09/28/2022    Anticoagulation Dosing: Description   Take one-and-one-half (1 & 1/2) tablets of your 2mg  strength, lavender colored warfarin tablets, by mouth, once-daily on SUNDAYS. All other days,take ONLY ONE (1) tablet.     INR today: Therapeutic  PLAN Weekly dose was unchanged.   Patient Instructions  Patient instructed to take medications as defined in the Anti-coagulation Track section of this encounter.  Patient instructed to take today's dose.  Patient instructed to take one-and-one-half (1 & 1/2) tablets of your 2mg  strength, lavender colored warfarin tablets, by mouth, once-daily on SUNDAYS. All other days,take ONLY ONE (1) tablet. Patient verbalized understanding of these instructions.  Patient advised to contact clinic or seek medical attention if signs/symptoms of bleeding or thromboembolism occur.  Patient verbalized understanding by repeating back information and was advised to contact me if further medication-related questions arise. Patient was also provided an information handout.  Follow-up Return in 4 weeks (on 12/07/2022) for Follow up INR.  Elicia Lamp, PharmD, CPP  15 minutes spent face-to-face with the patient during the encounter. 50% of time spent on education, including signs/sx bleeding and clotting, as well as food and drug interactions with warfarin. 50% of time was spent on fingerprick POC INR sample collection,processing, results determination, and documentation in [email or web address].

## 2022-11-09 NOTE — Patient Instructions (Signed)
Patient instructed to take medications as defined in the Anti-coagulation Track section of this encounter.  Patient instructed to take today's dose.  Patient instructed to take one-and-one-half (1 & 1/2) tablets of your 2mg strength, lavender colored warfarin tablets, by mouth, once-daily on SUNDAYS. All other days,take ONLY ONE (1) tablet. Patient verbalized understanding of these instructions.  

## 2022-12-01 ENCOUNTER — Other Ambulatory Visit (HOSPITAL_COMMUNITY): Payer: Self-pay

## 2022-12-07 ENCOUNTER — Ambulatory Visit (INDEPENDENT_AMBULATORY_CARE_PROVIDER_SITE_OTHER): Payer: Medicare Other | Admitting: Pharmacist

## 2022-12-07 DIAGNOSIS — D6869 Other thrombophilia: Secondary | ICD-10-CM

## 2022-12-07 DIAGNOSIS — Z7901 Long term (current) use of anticoagulants: Secondary | ICD-10-CM | POA: Diagnosis not present

## 2022-12-07 LAB — POCT INR: INR: 1.7 — AB (ref 2.0–3.0)

## 2022-12-07 NOTE — Patient Instructions (Signed)
Patient instructed to take medications as defined in the Anti-coagulation Track section of this encounter.  Patient instructed to take today's dose, plus ONE (1) additional 2mg  strength warfarin tablet--just for today, Monday 3-JUNE-24.  Patient instructed to take one-and-one-half (1 & 1/2) tablets of your 2mg  strength, lavender colored warfarin tablets, by mouth, once-daily on SUNDAYS and WEDNESDAYS. All other days,take ONLY ONE (1) tablet. Patient verbalized understanding of these instructions.

## 2022-12-07 NOTE — Progress Notes (Signed)
Anticoagulation Management Pamela [name] is a 77 y.o. female who reports to the clinic for monitoring of warfarin treatment.    Indication:  History of VTE 2/2 a secondary hypercoagulable state. Long term current use of oral anticoagulation with warfarin for a targeted INR range of 2.0 - 3.0.   Duration: indefinite Supervising physician:  Reymundo Poll, MD  Anticoagulation Clinic Visit History: Patient does not report signs/symptoms of bleeding or thromboembolism  Other recent changes: No diet, medications, lifestyle except as noted in the patient findings section.  Anticoagulation Episode Summary     Current INR goal:  2.0-3.0  TTR:  61.1 % (11.9 y)  Next INR check:  12/28/2022  INR from last check:  1.7 (12/07/2022)  Weekly max warfarin dose:    Target end date:  Indefinite  INR check location:  Anticoagulation Clinic  Preferred lab:    Send INR reminders to:  [facility]   Indications   Secondary hypercoagulable state (HCC); hx of recurrent DVTs [D68.69] DVT HX OF (Resolved) [Z86.718] Long term (current) use of anticoagulants [Z79.01]        Comments:          Anticoagulation Care Providers     Provider Role Specialty Phone number   Zoila Shutter, MD  Internal Medicine [phone]       Allergies  Allergen Reactions   Ace Inhibitors Cough    Current Outpatient Medications:    denosumab (PROLIA) 60 MG/ML SOSY injection, Inject 60 mg into the skin every 6 (six) months., Disp: , Rfl:    levothyroxine (SYNTHROID) 88 MCG tablet, Take 1 tablet (88 mcg total) by mouth daily before breakfast., Disp: 90 tablet, Rfl: 3   Multiple Vitamins-Minerals (ALGAE BASED CALCIUM) TABS, Take 500 mg by mouth daily., Disp: , Rfl:    pantoprazole (PROTONIX) 20 MG tablet, Take 1 tablet (20 mg total) by mouth daily., Disp: 90 tablet, Rfl: 3   Vitamin D, Ergocalciferol, (DRISDOL) 1.25 MG (50000 UNIT) CAPS capsule, Take 1 capsule (50,000 Units total) by mouth once a week.,  Disp: 13 capsule, Rfl: 0   warfarin (COUMADIN) 2 MG tablet, Take 1 tablet (2 mg total) by mouth daily at 4 PM., Disp: 28 tablet, Rfl: 3   colchicine 0.6 MG tablet, Take 1 tablet (0.6 mg total) by mouth daily. (Patient not taking: Reported on 12/07/2022), Disp: 6 tablet, Rfl: 1   colestipol (COLESTID) 1 g tablet, Take up to 6 tablets by mouth as needed no more than once daily for chronic biliary diarrhea (Patient not taking: Reported on 12/07/2022), Disp: 180 tablet, Rfl: 3 Past Medical History:  Diagnosis Date   Age-related nuclear cataract, bilateral 07/14/2018   Anemia    Anemia requiring transfusions 06/03/2006   Required post operative transfusion 01/2020. Mixed anemia with hx of macrocytic B12 deficiency anemia.    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 Eulah Pont with [facility] is following.   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    Employer: [employer]    Comment: Retired in 2011  Tobacco Use   Smoking status: Former    Packs/day: 1.00    Years: 20.00    Additional pack years: 0.00    Total pack years: 20.00    Types:  Cigarettes    Quit date: 07/06/1985    Years since quitting: 37.4   Smokeless tobacco: Never   Tobacco comments:    Quit 1988  Vaping Use   Vaping Use: Never used  Substance and Sexual Activity   Alcohol use: Yes    Comment: 2 glasses of wine daily    Drug use: No   Sexual activity: Not on file  Other Topics Concern   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       Current Social History 10/18/2019        Patient lives alone in a 2 level home where laundry is in the basement. There is one step without handrail up to the entrance patient uses.      Patient's method of transportation is personal car.      The highest level of education was advanced degree; Master's in Education.      The patient currently retired.      Identified important Relationships are My daughter, good friend, Darl Pikes who is starting with dementia. Several women from church and [employer].      Pets : None       Interests / Fun: Read a lot, take walks, [employer] with church groups and [employer] groups.       Current Stressors: "Cancer. And unable to get knee replacement last year 2/2 Covid pandemic and this year 2/2 Cancer. Hair is starting to fall out 2/2 chemo"      Religious / Personal Beliefs: "Episcopalian, Christian. I believe in God and Heaven and the Trinity."       L. Ducatte, BSN, RN-BC       Social Determinants of Health   Financial Resource Strain: Not on file  Food Insecurity: No Food Insecurity (03/24/2022)   Hunger Vital Sign    Worried About Running Out of Food in the Last Year: Never true    Ran Out of Food in the Last Year: Never true  Transportation Needs: No Transportation Needs (03/24/2022)   PRAPARE - Administrator, Civil Service (Medical): No    Lack of Transportation (Non-Medical): No  Physical Activity: Not on file  Stress: Not on file  Social Connections: Not on file   Family History  Problem  Relation Age of Onset   Cancer Mother        H&N, smoker   Liver disease Mother    Cancer Sister        lung, 2011, smoker   Other Other        grandmother had mastectomy in her 45's unknown reason   Macular degeneration Father    Diverticulitis Father  Breast cancer Neg Hx     ASSESSMENT Recent Results: The most recent result is correlated with 15 mg per week: Lab Results  Component Value Date   INR 1.7 (A) 12/07/2022   INR 2.2 11/09/2022   INR 3.5 (A) 10/19/2022    Anticoagulation Dosing: Description   Take one-and-one-half (1 & 1/2) tablets of your 2mg  strength, lavender colored warfarin tablets, by mouth, once-daily on SUNDAYS and WEDNESDAYS. All other days,take ONLY ONE (1) tablet.     INR today: Subtherapeutic  PLAN Weekly dose was increased by 7% to 18 mg per week  Patient Instructions  Patient instructed to take medications as defined in the Anti-coagulation Track section of this encounter.  Patient instructed to take today's dose, plus ONE (1) additional 2mg  strength warfarin tablet--just for today, Monday 3-JUNE-24.  Patient instructed to take one-and-one-half (1 & 1/2) tablets of your 2mg  strength, lavender colored warfarin tablets, by mouth, once-daily on SUNDAYS and WEDNESDAYS. All other days,take ONLY ONE (1) tablet. Patient verbalized understanding of these instructions.  Patient advised to contact clinic or seek medical attention if signs/symptoms of bleeding or thromboembolism occur.  Patient verbalized understanding by repeating back information and was advised to contact me if further medication-related questions arise. Patient was also provided an information handout.  Follow-up Return in 3 weeks (on 12/28/2022).  Elicia Lamp, PharmD, CPP  15 minutes spent face-to-face with the patient during the encounter. 50% of time spent on education, including signs/sx bleeding and clotting, as well as food and drug interactions with warfarin. 50% of time was  spent on fingerprick POC INR sample collection,processing, results determination, and documentation in [email or web address].

## 2022-12-07 NOTE — Progress Notes (Signed)
INTERNAL MEDICINE TEACHING ATTENDING ADDENDUM   I agree with pharmacy recommendations as outlined in their note.   Tawnee Clegg, MD  

## 2022-12-16 ENCOUNTER — Ambulatory Visit (INDEPENDENT_AMBULATORY_CARE_PROVIDER_SITE_OTHER): Payer: Medicare Other

## 2022-12-16 VITALS — Ht 64.0 in | Wt 170.0 lb

## 2022-12-16 DIAGNOSIS — Z0000 Encounter for general adult medical examination without abnormal findings: Secondary | ICD-10-CM

## 2022-12-16 NOTE — Progress Notes (Addendum)
I reviewed the AWV findings with the provider who conducted the visit. I was present in the office suite and immediately available to provide assistance and direction throughout the time the service was provided.   Katie M. Masters, D.O.  Internal Medicine Resident, PGY-2 [facility]      I connected with  Pamela [name] on 12/16/22 by a audio enabled telemedicine application and verified that I am speaking with the correct person using two identifiers.  Patient Location: Home  Provider Location: Office/Clinic  I discussed the limitations of evaluation and management by telemedicine. The patient expressed understanding and agreed to proceed. Subjective:   Pamela [name] is a 77 y.o. female who presents for Medicare Annual (Subsequent) preventive examination.  Review of Systems     Cardiac Risk Factors include: advanced age (>76men, >52 women);hypertension     Objective:    Today's Vitals   12/16/22 0957  Weight: 170 lb (77.1 kg)  Height: 5\' 4"  (1.626 m)   Body mass index is 29.18 kg/m.     12/16/2022   10:03 AM 07/23/2022    9:52 AM 09/18/2021    9:25 AM 03/20/2021   11:13 AM 12/10/2020    3:00 PM 12/10/2020    8:01 AM 12/04/2020   10:03 AM  Advanced Directives  Does Patient Have a Medical Advance Directive? Yes Yes Yes Yes Yes Yes Yes  Type of Estate agent of [place];Living will Healthcare Power of [place];Living will Healthcare Power of [place];Living will Healthcare Power of [place];Living will Healthcare Power of [place];Living will Healthcare Power of [place];Living will Healthcare Power of [place];Living will  Does patient want to make changes to medical advance directive?  No - Patient declined No - Patient declined No - Patient declined No - Patient declined    Copy of Healthcare Power of Attorney in Chart? Yes - validated most recent copy scanned in chart (See row information) Yes - validated most  recent copy scanned in chart (See row information) Yes - validated most recent copy scanned in chart (See row information) Yes - validated most recent copy scanned in chart (See row information) No - copy requested No - copy requested     Current Medications (verified) Outpatient Encounter Medications as of 12/16/2022  Medication Sig   Cholecalciferol (VITAMIN D-3) 25 MCG (1000 UT) CAPS Take 1 capsule by mouth daily.   denosumab (PROLIA) 60 MG/ML SOSY injection Inject 60 mg into the skin every 6 (six) months.   levothyroxine (SYNTHROID) 88 MCG tablet Take 1 tablet (88 mcg total) by mouth daily before breakfast.   Magnesium 500 MG TABS Take 1 tablet by mouth daily.   Multiple Vitamins-Minerals (ALGAE BASED CALCIUM) TABS Take 500 mg by mouth daily.   pantoprazole (PROTONIX) 20 MG tablet Take 1 tablet (20 mg total) by mouth daily.   warfarin (COUMADIN) 2 MG tablet Take 1 tablet (2 mg total) by mouth daily at 4 PM.   colchicine 0.6 MG tablet Take 1 tablet (0.6 mg total) by mouth daily. (Patient not taking: Reported on 12/07/2022)   colestipol (COLESTID) 1 g tablet Take up to 6 tablets by mouth as needed no more than once daily for chronic biliary diarrhea (Patient not taking: Reported on 12/07/2022)   Vitamin D, Ergocalciferol, (DRISDOL) 1.25 MG (50000 UNIT) CAPS capsule Take 1 capsule (50,000 Units total) by mouth once a week. (Patient not taking: Reported on 12/16/2022)   No facility-administered encounter medications on file as of 12/16/2022.  Allergies (verified) Ace inhibitors   History: Past Medical History:  Diagnosis Date   Age-related nuclear cataract, bilateral 07/14/2018   Anemia    Anemia requiring transfusions 06/03/2006   Required post operative transfusion 01/2020. Mixed anemia with hx of macrocytic B12 deficiency anemia.    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 Eulah Pont with [facility] is following.   PONV (postoperative nausea and vomiting)    S/P total knee arthroplasty, right 12/10/2020   Urothelial cancer (HCC) dx'd 09/05/2019   Past Surgical History:  Procedure Laterality Date   ANKLE ARTHROPLASTY Right 2008   CHOLECYSTECTOMY     2004   CYSTOSCOPY WITH INJECTION N/A 01/17/2020   Procedure: CYSTOSCOPY WITH INJECTION;  Surgeon: Sebastian Ache, MD;  Location: [facility];  Service: Urology;  Laterality: N/A;   HAND CONTRACTURE RELEASE Left 1993   IR IMAGING GUIDED PORT INSERTION  09/20/2019   IR REMOVAL TUN ACCESS W/ PORT W/O FL MOD SED  09/06/2020   KNEE ARTHROSCOPY Right 2004   OVARY SURGERY Right 1998   ROBOT ASSISTED LAPAROSCOPIC COMPLETE CYSTECT ILEAL CONDUIT N/A 01/17/2020   Procedure: XI ROBOTIC ASSISTED LAPAROSCOPIC COMPLETE CYSTECT ILEAL CONDUIT, XI ROBOTIC ASSISTED LAPAPRSCOPIC HYSTERECTOMY, LEFT OOPHERECTOMY,SALPINGECTOMY;LYMPHADENECTOMY;  Surgeon: Sebastian Ache, MD;  Location: [facility];  Service: Urology;  Laterality: N/A;  6 HRS   TONSILLECTOMY     TOTAL KNEE ARTHROPLASTY Right 12/10/2020   Procedure: TOTAL KNEE ARTHROPLASTY;  Surgeon: Sheral Apley, MD;  Location: [facility];  Service: Orthopedics;  Laterality: Right;   TRANSURETHRAL RESECTION OF BLADDER TUMOR WITH MITOMYCIN-C N/A 08/28/2019   Procedure: TRANSURETHRAL RESECTION OF BLADDER TUMOR;  Surgeon: Crista Elliot, MD;  Location: [facility];  Service: Urology;  Laterality: N/A;   wisdom teeth extractcion      Family History  Problem Relation Age of Onset   Cancer Mother        H&N, smoker    Liver disease Mother    Cancer Sister        lung, 2011, smoker   Other Other        grandmother had mastectomy in her 52's unknown reason   Macular degeneration Father    Diverticulitis Father    Breast cancer Neg Hx    Social History   Socioeconomic History   Marital status: Widowed    Spouse name: Not on file   Number of children: Not on file   Years of education: Not on file   Highest education level: Not on file  Occupational History   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    Employer: [employer]    Comment: Retired in 2011  Tobacco Use   Smoking status: Former    Packs/day: 1.00    Years: 20.00    Additional pack years: 0.00    Total pack years: 20.00    Types: Cigarettes    Quit date: 07/06/1985    Years since quitting:  37.4   Smokeless tobacco: Never   Tobacco comments:    Quit 1988  Vaping Use   Vaping Use: Never used  Substance and Sexual Activity   Alcohol use: Yes    Comment: 2 glasses of wine daily    Drug use: No   Sexual activity: Not on file  Other Topics Concern   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       Current Social History 10/18/2019        Patient lives alone in a 2 level home where laundry is in the basement. There is one step without handrail up to the entrance patient uses.      Patient's method of transportation is personal car.      The highest level of education was advanced degree; Master's in Education.      The patient currently retired.      Identified important Relationships are My daughter, good friend, Darl Pikes who is starting with dementia. Several women from church and [employer].      Pets : None       Interests / Fun: Read a lot, take walks, [employer] with church groups and [employer] groups.       Current Stressors: "Cancer. And unable to get knee replacement last year 2/2 Covid pandemic and this year 2/2 Cancer. Hair is starting to fall out 2/2  chemo"      Religious / Personal Beliefs: "Episcopalian, Christian. I believe in God and Heaven and the Trinity."       L. [place], BSN, RN-BC       Social Determinants of Health   Financial Resource Strain: Low Risk  (12/16/2022)   Overall Financial Resource Strain (CARDIA)    Difficulty of Paying Living Expenses: Not hard at all  Food Insecurity: No Food Insecurity (12/16/2022)   Hunger Vital Sign    Worried About Running Out of Food in the Last Year: Never true    Ran Out of Food in the Last Year: Never true  Transportation Needs: No Transportation Needs (12/16/2022)   PRAPARE - Administrator, Civil Service (Medical): No    Lack of Transportation (Non-Medical): No  Physical Activity: Insufficiently Active (12/16/2022)   Exercise Vital Sign    Days of Exercise per Week: 2 days    Minutes of Exercise per Session: 30 min  Stress: No Stress Concern Present (12/16/2022)   [employer] of Occupational Health - Occupational Stress Questionnaire    Feeling of Stress : Not at all  Social Connections: Not on file    Tobacco Counseling Counseling given: Not Answered Tobacco comments: Quit 1988   Clinical Intake:  Pre-visit preparation completed: Yes  Pain : No/denies pain     Nutritional Status: BMI 25 -29 Overweight Nutritional Risks: Nausea/ vomitting/ diarrhea (diarrhea due to bile) Diabetes: No  How often do you need to have someone help you when you read instructions, pamphlets, or other written materials from your doctor or pharmacy?: 1 - Never  Diabetic? no  Interpreter Needed?: No  Information entered by :: NAllen LPN   Activities of Daily Living    12/16/2022   10:05 AM 07/23/2022    9:52 AM  In your present state of health, do you have any difficulty performing the following activities:  Hearing? 1 0  Comment has hearing aids   Vision? 0 0  Difficulty concentrating or  making decisions? 0 0  Walking or climbing stairs? 0 1  Dressing or  bathing? 0 0  Doing errands, shopping? 0 0  Preparing Food and eating ? N   Using the Toilet? N   In the past six months, have you accidently leaked urine? N   Comment has an urostomy   Do you have problems with loss of bowel control? N   Managing your Medications? N   Managing your Finances? N   Housekeeping or managing your Housekeeping? N     Patient Care Team: Miguel Aschoff, MD as PCP - General (Internal Medicine) Vida Rigger, MD as Consulting Physician (Gastroenterology) Sheral Apley, MD as Attending Physician (Orthopedic Surgery) Geryl Rankins, MD as Consulting Physician (Obstetrics and Gynecology) Benjiman Core, MD (Inactive) as Consulting Physician (Oncology) Elicia Lamp, RPH-CPP as Pharmacist (Pharmacist) Marvis Repress, MD as Referring Physician (Ophthalmology) Maris Berger, MD as Consulting Physician (Ophthalmology) Berneice Heinrich Delbert Phenix., MD as Consulting Physician (Urology)  Indicate any recent Medical Services you may have received from other than [facility] providers in the past year (date may be approximate).     Assessment:   This is a routine wellness examination for [place].  Hearing/Vision screen Vision Screening - Comments:: Regular eye exams, Dr. Charlotte Sanes  Dietary issues and exercise activities discussed: Current Exercise Habits: Home exercise routine, Type of exercise: walking, Time (Minutes): 30, Frequency (Times/Week): 2, Weekly Exercise (Minutes/Week): 60   Goals Addressed             This Visit's Progress    Patient Stated       12/16/2022, wants to lose more weight       Depression Screen    12/16/2022   10:05 AM 07/23/2022    9:55 AM 09/18/2021    9:27 AM 03/20/2021   11:14 AM 03/07/2020    9:05 AM 10/18/2019    2:55 PM 06/13/2019    2:44 PM  PHQ 2/9 Scores  PHQ - 2 Score 0 0 0 0 0 1 0  PHQ- 9 Score      5     Fall Risk    12/16/2022   10:04 AM 07/23/2022    9:52 AM 09/18/2021    9:27 AM 03/20/2021   11:11 AM 03/07/2020     9:05 AM  Fall Risk   Falls in the past year? 1 0 0 0 0  Comment fell out of bed      Number falls in past yr: 0 0 0 0 0  Injury with Fall? 0 0 0 0 0  Risk for fall due to : Medication side effect      Follow up Falls prevention discussed;Education provided;Falls evaluation completed Falls evaluation completed Falls evaluation completed Falls evaluation completed     FALL RISK PREVENTION PERTAINING TO THE HOME:  Any stairs in or around the home? No  If so, are there any without handrails?  N/a Home free of loose throw rugs in walkways, pet beds, electrical cords, etc? Yes  Adequate lighting in your home to reduce risk of falls? Yes   ASSISTIVE DEVICES UTILIZED TO PREVENT FALLS:  Life alert? No  Use of a cane, walker or w/c? No  Grab bars in the bathroom? Yes  Shower chair or bench in shower? Yes  Elevated toilet seat or a handicapped toilet? Yes   TIMED UP AND GO:  Was the test performed? No .      Cognitive Function:  12/16/2022   10:07 AM  6CIT Screen  What Year? 0 points  What month? 0 points  What time? 0 points  Count back from 20 0 points  Months in reverse 0 points  Repeat phrase 0 points  Total Score 0 points    Immunizations Immunization History  Administered Date(s) Administered   Covid-19, Mrna,Vaccine(Spikevax)42yrs and older 10/22/2022   Fluad Quad(high Dose 65+) 03/20/2021, 04/06/2022   Influenza Whole 03/06/2010, 03/04/2011   Influenza,inj,Quad PF,6+ Mos 03/19/2014, 03/04/2015, 03/22/2017, 02/24/2018, 05/01/2019, 03/07/2020   Influenza-Unspecified 04/05/2013   PFIZER(Purple Top)SARS-COV-2 Vaccination 07/28/2019, 08/18/2019, 04/24/2020   Pfizer Covid-19 Vaccine Bivalent Booster 74yrs & up 03/27/2021   Pneumococcal Conjugate-13 12/13/2014   Pneumococcal Polysaccharide-23 04/09/2009   Respiratory Syncytial Virus Vaccine,Recomb Aduvanted(Arexvy) 09/10/2022   Td 02/16/2007   Tdap 06/21/2017   Zoster Recombinat (Shingrix) 07/23/2021,  10/15/2021    TDAP status: Up to date  Flu Vaccine status: Up to date  Pneumococcal vaccine status: Up to date  Covid-19 vaccine status: Completed vaccines  Qualifies for Shingles Vaccine? Yes   Zostavax completed Yes   Shingrix Completed?: Yes  Screening Tests Health Maintenance  Topic Date Due   Medicare Annual Wellness (AWV)  10/17/2020   COVID-19 Vaccine (6 - 2023-24 season) 12/17/2022   INFLUENZA VACCINE  02/04/2023   DTaP/Tdap/Td (3 - Td or Tdap) 06/22/2027   Pneumonia Vaccine 109+ Years old  Completed   DEXA SCAN  Completed   Hepatitis C Screening  Completed   Zoster Vaccines- Shingrix  Completed   HPV VACCINES  Aged Out   Colonoscopy  Discontinued    Health Maintenance  Health Maintenance Due  Topic Date Due   Medicare Annual Wellness (AWV)  10/17/2020   COVID-19 Vaccine (6 - 2023-24 season) 12/17/2022    Colorectal cancer screening: No longer required.   Mammogram status: Completed 08/03/2022. Repeat every year  Bone Density status: Completed 04/28/2021.   Lung Cancer Screening: (Low Dose CT Chest recommended if Age 71-80 years, 30 pack-year currently smoking OR have quit w/in 15years.) does not qualify.   Lung Cancer Screening Referral: no  Additional Screening:  Hepatitis C Screening: does qualify; Completed 12/12/2015  Vision Screening: Recommended annual ophthalmology exams for early detection of glaucoma and other disorders of the eye. Is the patient up to date with their annual eye exam?  Yes  Who is the provider or what is the name of the office in which the patient attends annual eye exams? Dr. Charlotte Sanes If pt is not established with a provider, would they like to be referred to a provider to establish care? No .   Dental Screening: Recommended annual dental exams for proper oral hygiene  Community Resource Referral / Chronic Care Management: CRR required this visit?  No   CCM required this visit?  No      Plan:     I have personally  reviewed and noted the following in the patient's chart:   Medical and social history Use of alcohol, tobacco or illicit drugs  Current medications and supplements including opioid prescriptions. Patient is not currently taking opioid prescriptions. Functional ability and status Nutritional status Physical activity Advanced directives List of other physicians Hospitalizations, surgeries, and ER visits in previous 12 months Vitals Screenings to include cognitive, depression, and falls Referrals and appointments  In addition, I have reviewed and discussed with patient certain preventive protocols, quality metrics, and best practice recommendations. A written personalized care plan for preventive services as well as general preventive health recommendations were provided  to patient.     Barb Merino, LPN   [id number]   Nurse Notes: none  Due to this being a virtual visit, the after visit summary with patients personalized plan was offered to patient via mail or my-chart.  Patient would like to access on my-chart

## 2022-12-16 NOTE — Patient Instructions (Signed)
[name] , Thank you for taking time to come for your Medicare Wellness Visit. I appreciate your ongoing commitment to your health goals. Please review the following plan we discussed and let me know if I can assist you in the future.   These are the goals we discussed:  Goals       Blood Pressure < 130/80      Care Coordination Activities (pt-stated)      Care Coordination Interventions: Reviewed scheduled/upcoming provider appointments including . Discussed plans with patient for ongoing care management follow up and provided patient with direct contact information for care management team Assessed social determinant of health barriers  Patient declined needing further assistance.      Walk daily 20 minutes per time (pt-stated)      Patient would like to increase this to 40 minutes daily once she has right knee replaced        This is a list of the screening recommended for you and due dates:  Health Maintenance  Topic Date Due   Medicare Annual Wellness Visit  10/17/2020   COVID-19 Vaccine (5 - 2023-24 season) 03/06/2022   Flu Shot  02/04/2023   DTaP/Tdap/Td vaccine (3 - Td or Tdap) 06/22/2027   Pneumonia Vaccine  Completed   DEXA scan (bone density measurement)  Completed   Hepatitis C Screening  Completed   Zoster (Shingles) Vaccine  Completed   HPV Vaccine  Aged Out   Colon Cancer Screening  Discontinued    Advanced directives: copy in chart  Conditions/risks identified: none  Next appointment: Follow up in one year for your annual wellness visit    Preventive Care 65 Years and Older, Female Preventive care refers to lifestyle choices and visits with your health care provider that can promote health and wellness. What does preventive care include? A yearly physical exam. This is also called an annual well check. Dental exams once or twice a year. Routine eye exams. Ask your health care provider how often you should have your eyes checked. Personal lifestyle  choices, including: Daily care of your teeth and gums. Regular physical activity. Eating a healthy diet. Avoiding tobacco and drug use. Limiting alcohol use. Practicing safe sex. Taking low-dose aspirin every day. Taking vitamin and mineral supplements as recommended by your health care provider. What happens during an annual well check? The services and screenings done by your health care provider during your annual well check will depend on your age, overall health, lifestyle risk factors, and family history of disease. Counseling  Your health care provider may ask you questions about your: Alcohol use. Tobacco use. Drug use. Emotional well-being. Home and relationship well-being. Sexual activity. Eating habits. History of falls. Memory and ability to understand (cognition). Work and work Astronomer. Reproductive health. Screening  You may have the following tests or measurements: Height, weight, and BMI. Blood pressure. Lipid and cholesterol levels. These may be checked every 5 years, or more frequently if you are over 62 years old. Skin check. Lung cancer screening. You may have this screening every year starting at age 63 if you have a 30-pack-year history of smoking and currently smoke or have quit within the past 15 years. Fecal occult blood test (FOBT) of the stool. You may have this test every year starting at age 29. Flexible sigmoidoscopy or colonoscopy. You may have a sigmoidoscopy every 5 years or a colonoscopy every 10 years starting at age 66. Hepatitis C blood test. Hepatitis B blood test. Sexually transmitted disease (  STD) testing. Diabetes screening. This is done by checking your blood sugar (glucose) after you have not eaten for a while (fasting). You may have this done every 1-3 years. Bone density scan. This is done to screen for osteoporosis. You may have this done starting at age 25. Mammogram. This may be done every 1-2 years. Talk to your health care  provider about how often you should have regular mammograms. Talk with your health care provider about your test results, treatment options, and if necessary, the need for more tests. Vaccines  Your health care provider may recommend certain vaccines, such as: Influenza vaccine. This is recommended every year. Tetanus, diphtheria, and acellular pertussis (Tdap, Td) vaccine. You may need a Td booster every 10 years. Zoster vaccine. You may need this after age 85. Pneumococcal 13-valent conjugate (PCV13) vaccine. One dose is recommended after age 20. Pneumococcal polysaccharide (PPSV23) vaccine. One dose is recommended after age 24. Talk to your health care provider about which screenings and vaccines you need and how often you need them. This information is not intended to replace advice given to you by your health care provider. Make sure you discuss any questions you have with your health care provider. Document Released: 07/19/2015 Document Revised: 03/11/2016 Document Reviewed: 04/23/2015 Elsevier Interactive Patient Education  2017 [employer].  Fall Prevention in the Home Falls can cause injuries. They can happen to people of all ages. There are many things you can do to make your home safe and to help prevent falls. What can I do on the outside of my home? Regularly fix the edges of walkways and driveways and fix any cracks. Remove anything that might make you trip as you walk through a door, such as a raised step or threshold. Trim any bushes or trees on the path to your home. Use bright outdoor lighting. Clear any walking paths of anything that might make someone trip, such as rocks or tools. Regularly check to see if handrails are loose or broken. Make sure that both sides of any steps have handrails. Any raised decks and porches should have guardrails on the edges. Have any leaves, snow, or ice cleared regularly. Use sand or salt on walking paths during winter. Clean up any  spills in your garage right away. This includes oil or grease spills. What can I do in the bathroom? Use night lights. Install grab bars by the toilet and in the tub and shower. Do not use towel bars as grab bars. Use non-skid mats or decals in the tub or shower. If you need to sit down in the shower, use a plastic, non-slip stool. Keep the floor dry. Clean up any water that spills on the floor as soon as it happens. Remove soap buildup in the tub or shower regularly. Attach bath mats securely with double-sided non-slip rug tape. Do not have throw rugs and other things on the floor that can make you trip. What can I do in the bedroom? Use night lights. Make sure that you have a light by your bed that is easy to reach. Do not use any sheets or blankets that are too big for your bed. They should not hang down onto the floor. Have a firm chair that has side arms. You can use this for support while you get dressed. Do not have throw rugs and other things on the floor that can make you trip. What can I do in the kitchen? Clean up any spills right away. Avoid walking on  wet floors. Keep items that you use a lot in easy-to-reach places. If you need to reach something above you, use a strong step stool that has a grab bar. Keep electrical cords out of the way. Do not use floor polish or wax that makes floors slippery. If you must use wax, use non-skid floor wax. Do not have throw rugs and other things on the floor that can make you trip. What can I do with my stairs? Do not leave any items on the stairs. Make sure that there are handrails on both sides of the stairs and use them. Fix handrails that are broken or loose. Make sure that handrails are as long as the stairways. Check any carpeting to make sure that it is firmly attached to the stairs. Fix any carpet that is loose or worn. Avoid having throw rugs at the top or bottom of the stairs. If you do have throw rugs, attach them to the floor  with carpet tape. Make sure that you have a light switch at the top of the stairs and the bottom of the stairs. If you do not have them, ask someone to add them for you. What else can I do to help prevent falls? Wear shoes that: Do not have high heels. Have rubber bottoms. Are comfortable and fit you well. Are closed at the toe. Do not wear sandals. If you use a stepladder: Make sure that it is fully opened. Do not climb a closed stepladder. Make sure that both sides of the stepladder are locked into place. Ask someone to hold it for you, if possible. Clearly mark and make sure that you can see: Any grab bars or handrails. First and last steps. Where the edge of each step is. Use tools that help you move around (mobility aids) if they are needed. These include: Canes. Walkers. Scooters. Crutches. Turn on the lights when you go into a dark area. Replace any light bulbs as soon as they burn out. Set up your furniture so you have a clear path. Avoid moving your furniture around. If any of your floors are uneven, fix them. If there are any pets around you, be aware of where they are. Review your medicines with your doctor. Some medicines can make you feel dizzy. This can increase your chance of falling. Ask your doctor what other things that you can do to help prevent falls. This information is not intended to replace advice given to you by your health care provider. Make sure you discuss any questions you have with your health care provider. Document Released: 04/18/2009 Document Revised: 11/28/2015 Document Reviewed: 07/27/2014 Elsevier Interactive Patient Education  2017 [employer].

## 2022-12-24 ENCOUNTER — Telehealth: Payer: Self-pay

## 2022-12-24 NOTE — Telephone Encounter (Signed)
Patient called she is scheduled to come in 6/27 to see Dr.Williams and to get a prolia injection, patient stated the last injection she had was 2/27 she wants to know if she will be able to get it during her visit. Please return patients call.

## 2022-12-25 ENCOUNTER — Encounter: Payer: Self-pay | Admitting: Internal Medicine

## 2022-12-28 ENCOUNTER — Ambulatory Visit (INDEPENDENT_AMBULATORY_CARE_PROVIDER_SITE_OTHER): Payer: Medicare Other | Admitting: Pharmacist

## 2022-12-28 DIAGNOSIS — D6869 Other thrombophilia: Secondary | ICD-10-CM | POA: Diagnosis not present

## 2022-12-28 DIAGNOSIS — D682 Hereditary deficiency of other clotting factors: Secondary | ICD-10-CM

## 2022-12-28 DIAGNOSIS — Z7901 Long term (current) use of anticoagulants: Secondary | ICD-10-CM

## 2022-12-28 LAB — POCT INR: POC INR: 1.8

## 2022-12-28 NOTE — Patient Instructions (Signed)
Patient instructed to take medications as defined in the Anti-coagulation Track section of this encounter.  Patient instructed to take today's dose.  Patient instructed to take one-and-one-half (1 & 1/2) tablets of your 2mg  strength, lavender colored warfarin tablets, by mouth, once-daily on MONDAYS, TUESDAYS,WEDNESDAYS and FRIDAYS. All other days,take ONLY ONE (1) tablet. Patient verbalized understanding of these instructions.

## 2022-12-28 NOTE — Progress Notes (Signed)
Anticoagulation Management Pamela [name] is a 77 y.o. female who reports to the clinic for monitoring of warfarin treatment.    Indication:  Hypercoagulable state, Clotting Factor II deficiency, long term current use of oral anticoagulation with warfarin for target INR range. 2.0 - 3.0; history of VTE.   Duration: indefinite Supervising physician: Debe Coder  Anticoagulation Clinic Visit History: Patient does not report signs/symptoms of bleeding or thromboembolism  Other recent changes: No diet, medications, lifestyle except as noted.  Anticoagulation Episode Summary     Current INR goal:  2.0-3.0  TTR:  60.8 % (12 y)  Next INR check:  01/25/2023  INR from last check:  1.80 (12/28/2022)  Weekly max warfarin dose:    Target end date:  Indefinite  INR check location:  Anticoagulation Clinic  Preferred lab:    Send INR reminders to:  [facility]   Indications   Secondary hypercoagulable state (HCC); hx of recurrent DVTs [D68.69] DVT HX OF (Resolved) [Z86.718] Long term (current) use of anticoagulants [Z79.01]        Comments:          Anticoagulation Care Providers     Provider Role Specialty Phone number   Zoila Shutter, MD  Internal Medicine [phone]       Allergies  Allergen Reactions   Ace Inhibitors Cough    Current Outpatient Medications:    Cholecalciferol (VITAMIN D-3) 25 MCG (1000 UT) CAPS, Take 1 capsule by mouth daily., Disp: , Rfl:    denosumab (PROLIA) 60 MG/ML SOSY injection, Inject 60 mg into the skin every 6 (six) months., Disp: , Rfl:    levothyroxine (SYNTHROID) 88 MCG tablet, Take 1 tablet (88 mcg total) by mouth daily before breakfast., Disp: 90 tablet, Rfl: 3   Magnesium 500 MG TABS, Take 1 tablet by mouth daily., Disp: , Rfl:    Multiple Vitamins-Minerals (ALGAE BASED CALCIUM) TABS, Take 500 mg by mouth daily., Disp: , Rfl:    pantoprazole (PROTONIX) 20 MG tablet, Take 1 tablet (20 mg total) by mouth daily., Disp: 90 tablet,  Rfl: 3   Vitamin D, Ergocalciferol, (DRISDOL) 1.25 MG (50000 UNIT) CAPS capsule, Take 1 capsule (50,000 Units total) by mouth once a week., Disp: 13 capsule, Rfl: 0   warfarin (COUMADIN) 2 MG tablet, Take 1 tablet (2 mg total) by mouth daily at 4 PM., Disp: 28 tablet, Rfl: 3   colchicine 0.6 MG tablet, Take 1 tablet (0.6 mg total) by mouth daily. (Patient not taking: Reported on 12/07/2022), Disp: 6 tablet, Rfl: 1   colestipol (COLESTID) 1 g tablet, Take up to 6 tablets by mouth as needed no more than once daily for chronic biliary diarrhea (Patient not taking: Reported on 12/07/2022), Disp: 180 tablet, Rfl: 3 Past Medical History:  Diagnosis Date   Age-related nuclear cataract, bilateral 07/14/2018   Anemia    Anemia requiring transfusions 06/03/2006   Required post operative transfusion 01/2020. Mixed anemia with hx of macrocytic B12 deficiency anemia.    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 Eulah Pont with [facility] is following.   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    Employer: [employer]    Comment: Retired in 2011   Tobacco Use   Smoking status: Former    Packs/day: 1.00    Years: 20.00    Additional pack years: 0.00    Total pack years: 20.00    Types: Cigarettes    Quit date: 07/06/1985    Years since quitting: 37.5   Smokeless tobacco: Never   Tobacco comments:    Quit 1988  Vaping Use   Vaping Use: Never used  Substance and Sexual Activity   Alcohol use: Yes    Comment: 2 glasses of wine daily    Drug use: No   Sexual activity: Not on file  Other Topics Concern   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       Current Social History 10/18/2019        Patient lives alone in a 2 level home where laundry is in the basement. There is one step without handrail up to the entrance patient uses.      Patient's method of transportation is personal car.      The highest level of education was advanced degree; Master's in Education.      The patient currently retired.      Identified important Relationships are My daughter, good friend, Pamela [name] who is starting with dementia. Several women from church and [employer].      Pets : None       Interests / Fun: Read a lot, take walks, [employer] with church groups and [employer] groups.       Current Stressors: "Cancer. And unable to get knee replacement last year 2/2 Covid pandemic and this year 2/2 Cancer. Hair is starting to fall out 2/2 chemo"      Religious / Personal Beliefs: "Episcopalian, Christian. I believe in God and Heaven and the Trinity."       L. Ducatte, BSN, RN-BC       Social Determinants of Health   Financial Resource Strain: Low Risk  (12/16/2022)   Overall Financial Resource Strain (CARDIA)    Difficulty of Paying Living Expenses: Not hard at all  Food Insecurity: No Food Insecurity (12/16/2022)   Hunger Vital Sign    Worried About Running Out of Food in the Last Year: Never true    Ran Out of Food in the Last Year: Never true  Transportation Needs: No  Transportation Needs (12/16/2022)   PRAPARE - Administrator, Civil Service (Medical): No    Lack of Transportation (Non-Medical): No  Physical Activity: Insufficiently Active (12/16/2022)   Exercise Vital Sign    Days of Exercise per Week: 2 days    Minutes of Exercise per Session: 30 min  Stress: No Stress Concern Present (12/16/2022)   [employer] of Occupational  Health - Occupational Stress Questionnaire    Feeling of Stress : Not at all  Social Connections: Not on file   Family History  Problem Relation Age of Onset   Cancer Mother        H&N, smoker   Liver disease Mother    Cancer Sister        lung, 2011, smoker   Other Other        grandmother had mastectomy in her 1's unknown reason   Macular degeneration Father    Diverticulitis Father    Breast cancer Neg Hx     ASSESSMENT Recent Results: The most recent result is correlated with 16 mg per week: Lab Results  Component Value Date   INR 1.80 12/28/2022   INR 1.7 (A) 12/07/2022   INR 2.2 11/09/2022    Anticoagulation Dosing: Description   Take one-and-one-half (1 & 1/2) tablets of your 2mg  strength, lavender colored warfarin tablets, by mouth, once-daily on MONDAYS, TUESDAYS,WEDNESDAYS and FRIDAYS. All other days,take ONLY ONE (1) tablet.     INR today: Subtherapeutic  PLAN Weekly dose was increased by 12% to 18 mg per week  Patient Instructions  Patient instructed to take medications as defined in the Anti-coagulation Track section of this encounter.  Patient instructed to take today's dose.  Patient instructed to take one-and-one-half (1 & 1/2) tablets of your 2mg  strength, lavender colored warfarin tablets, by mouth, once-daily on MONDAYS, TUESDAYS,WEDNESDAYS and FRIDAYS. All other days,take ONLY ONE (1) tablet. Patient verbalized understanding of these instructions.  Patient advised to contact clinic or seek medical attention if signs/symptoms of bleeding or thromboembolism  occur.  Patient verbalized understanding by repeating back information and was advised to contact me if further medication-related questions arise. Patient was also provided an information handout.  Follow-up Return in 4 weeks (on 01/25/2023) for Follow up INR.  Elicia Lamp, PharmD, CPP  15 minutes spent face-to-face with the patient during the encounter. 50% of time spent on education, including signs/sx bleeding and clotting, as well as food and drug interactions with warfarin. 50% of time was spent on fingerprick POC INR sample collection,processing, results determination, and documentation in [email or web address].

## 2022-12-29 NOTE — Telephone Encounter (Signed)
Patient was called and is aware that she will have to get injection in late August.  Will come for appointment 12/31/2022.

## 2022-12-31 ENCOUNTER — Ambulatory Visit (INDEPENDENT_AMBULATORY_CARE_PROVIDER_SITE_OTHER): Payer: Medicare Other | Admitting: Internal Medicine

## 2022-12-31 VITALS — BP 134/70 | HR 79 | Temp 98.1°F | Ht 64.0 in | Wt 173.2 lb

## 2022-12-31 DIAGNOSIS — H903 Sensorineural hearing loss, bilateral: Secondary | ICD-10-CM

## 2022-12-31 DIAGNOSIS — I129 Hypertensive chronic kidney disease with stage 1 through stage 4 chronic kidney disease, or unspecified chronic kidney disease: Secondary | ICD-10-CM | POA: Diagnosis not present

## 2022-12-31 DIAGNOSIS — E039 Hypothyroidism, unspecified: Secondary | ICD-10-CM

## 2022-12-31 DIAGNOSIS — M810 Age-related osteoporosis without current pathological fracture: Secondary | ICD-10-CM

## 2022-12-31 DIAGNOSIS — N183 Chronic kidney disease, stage 3 unspecified: Secondary | ICD-10-CM

## 2022-12-31 DIAGNOSIS — K9089 Other intestinal malabsorption: Secondary | ICD-10-CM

## 2022-12-31 DIAGNOSIS — R252 Cramp and spasm: Secondary | ICD-10-CM

## 2022-12-31 DIAGNOSIS — Z87891 Personal history of nicotine dependence: Secondary | ICD-10-CM

## 2022-12-31 DIAGNOSIS — I10 Essential (primary) hypertension: Secondary | ICD-10-CM

## 2022-12-31 NOTE — Progress Notes (Signed)
AWV completed on 12/16/2022.

## 2022-12-31 NOTE — Patient Instructions (Signed)

## 2022-12-31 NOTE — Progress Notes (Signed)
Ms. Mozer is here for routine f/u, doing well with no new problems.  Since last visit she has received her last Covid shot (10/22/22 Moderna) and the RSV vaccine (09/09/22, both at [facility] on [address]. Saw dermatologist for full body skin check, no concerns.  Received her hearing aids, getting accustomed to them.    Lives alone, very active in the community, singing in choirs among other activities. Is aware of needing to pay attention to balance, but no falls.  Is particularly cautious on steps and curbs. L knee feels unstable when knee bent.   BP 134/70 (BP Location: Right Arm, Patient Position: Sitting, Cuff Size: Small)   Pulse 79   Temp 98.1 F (36.7 C) (Oral)   Ht 5\' 4"  (1.626 m)   Wt 173 lb 3.2 oz (78.6 kg)   SpO2 100%   BMI 29.73 kg/m  Decreased skin turgor. Pulses ok, no LE edema, varicosities lf lower legs are not thrombosed, not tender, no venous stasis dermatitis or ulcerations.    Assessment and Plan:  Essential hypertension, benign 134/70 on no antihypertensives at this time after discontinuing ARB at last visit.  Monitor.    Bile acid malabsorption syndrome / Diarrhea, resolving over the years. No symptoms.  Will stop colestipol.  She has not needed it in some time.   Hearing loss Getting accustomed to new hearing aides.  No concerns.   Osteoporosis, post-menopausal No bone pain, no fractures. Vit D level today.  Will be due for Prolia in 02/2023, I'm investigating whether it will be more cost-effective to do through infusion center or prescribed to be picked up at pharmacy and administered in [facility].    Hypothyroidism Asymptomatic; TSH slightly suppressed at last check; reassess today.   Discussion about maintaining balance and physical function with aging.  Advised exploring exercise programs to help with core strengthening, balance, and endurance such as Silver Sneakers.  Simple quad exercises such as chair stands can be done at home.  A fall alert device would  probably be premature and not cost effective at this time.  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  F/u 66M.

## 2023-01-01 LAB — TSH

## 2023-01-01 LAB — BMP8+ANION GAP

## 2023-01-01 LAB — MAGNESIUM

## 2023-01-02 ENCOUNTER — Other Ambulatory Visit: Payer: Self-pay | Admitting: Internal Medicine

## 2023-01-02 LAB — VITAMIN D 25 HYDROXY (VIT D DEFICIENCY, FRACTURES): Vit D, 25-Hydroxy: 60.5 ng/mL (ref 30.0–100.0)

## 2023-01-02 LAB — BMP8+ANION GAP
BUN: 22 mg/dL (ref 8–27)
CO2: 21 mmol/L (ref 20–29)
Calcium: 10 mg/dL (ref 8.7–10.3)
Chloride: 100 mmol/L (ref 96–106)
Potassium: 5.3 mmol/L — ABNORMAL HIGH (ref 3.5–5.2)
Sodium: 140 mmol/L (ref 134–144)
eGFR: 45 mL/min/{1.73_m2} — ABNORMAL LOW (ref >59)

## 2023-01-05 DIAGNOSIS — N183 Chronic kidney disease, stage 3 unspecified: Secondary | ICD-10-CM | POA: Insufficient documentation

## 2023-01-05 NOTE — Assessment & Plan Note (Signed)
Getting accustomed to new hearing aides.  No concerns.

## 2023-01-05 NOTE — Assessment & Plan Note (Signed)
Asymptomatic; TSH slightly suppressed at last check; reassess today.

## 2023-01-05 NOTE — Assessment & Plan Note (Addendum)
134/70 on no antihypertensives at this time after discontinuing ARB at last visit.  Monitor.

## 2023-01-05 NOTE — Assessment & Plan Note (Signed)
No symptoms.  Will stop colestipol.  She has not needed it in some time.

## 2023-01-05 NOTE — Assessment & Plan Note (Signed)
No bone pain, no fractures. Vit D level today.  Will be due for Prolia in 02/2023, I'm investigating whether it will be more cost-effective to do through infusion center or prescribed to be picked up at pharmacy and administered in [facility].

## 2023-01-07 NOTE — Progress Notes (Signed)
Evaluation and management procedures were performed by the Clinical Pharmacy Practitioner under my supervision and collaboration. I have reviewed the Practitioner's note and chart, and I agree with the management and plan as documented above. ° °

## 2023-01-13 ENCOUNTER — Other Ambulatory Visit: Payer: Self-pay

## 2023-01-13 ENCOUNTER — Other Ambulatory Visit (HOSPITAL_COMMUNITY): Payer: Self-pay

## 2023-01-15 NOTE — Progress Notes (Signed)
AWV reviewed.  I saw patient independently.

## 2023-01-25 ENCOUNTER — Ambulatory Visit (INDEPENDENT_AMBULATORY_CARE_PROVIDER_SITE_OTHER): Payer: Medicare Other | Admitting: Pharmacist

## 2023-01-25 DIAGNOSIS — D6869 Other thrombophilia: Secondary | ICD-10-CM

## 2023-01-25 DIAGNOSIS — Z7901 Long term (current) use of anticoagulants: Secondary | ICD-10-CM | POA: Diagnosis not present

## 2023-01-25 LAB — POCT INR: INR: 2 (ref 2.0–3.0)

## 2023-01-25 NOTE — Progress Notes (Signed)
Anticoagulation Management Pamela [name] is a 77 y.o. female who reports to the clinic for monitoring of warfarin treatment.    Indication: DVT, History of secondary to  a secondary hypercoagulable state; on oral anticoagulation with warfarin to maintain INR range 2.0 - 3.0.  Duration: indefinite Supervising physician: Debe Coder  Anticoagulation Clinic Visit History: Patient does not report signs/symptoms of bleeding or thromboembolism  Other recent changes: No diet, medications, lifestyle changes. Anticoagulation Episode Summary     Current INR goal:  2.0-3.0  TTR:  60.4% (12.1 y)  Next INR check:  02/22/2023  INR from last check:  --  Weekly max warfarin dose:  --  Target end date:  Indefinite  INR check location:  Anticoagulation Clinic  Preferred lab:  --  Send INR reminders to:  [facility]   Indications   Secondary hypercoagulable state (HCC); hx of recurrent DVTs [D68.69] DVT HX OF (Resolved) [Z86.718] Long term (current) use of anticoagulants [Z79.01]        Comments:  --        Anticoagulation Care Providers     Provider Role Specialty Phone number   Zoila Shutter, MD  Internal Medicine [phone]       Allergies  Allergen Reactions   Ace Inhibitors Cough    Current Outpatient Medications:    Cholecalciferol (VITAMIN D-3) 25 MCG (1000 UT) CAPS, Take 1 capsule by mouth daily., Disp: , Rfl:    denosumab (PROLIA) 60 MG/ML SOSY injection, Inject 60 mg into the skin every 6 (six) months., Disp: , Rfl:    levothyroxine (SYNTHROID) 88 MCG tablet, Take 1 tablet (88 mcg total) by mouth daily before breakfast., Disp: 90 tablet, Rfl: 3   Magnesium 500 MG TABS, Take 1 tablet by mouth daily., Disp: , Rfl:    Multiple Vitamins-Minerals (ALGAE BASED CALCIUM) TABS, Take 500 mg by mouth daily., Disp: , Rfl:    pantoprazole (PROTONIX) 20 MG tablet, Take 1 tablet (20 mg total) by mouth daily., Disp: 90 tablet, Rfl: 3   warfarin (COUMADIN) 2 MG tablet,  Take 1 tablet (2 mg total) by mouth daily at 4 PM., Disp: 28 tablet, Rfl: 3 Past Medical History:  Diagnosis Date   Age-related nuclear cataract, bilateral 07/14/2018   Anemia    Anemia requiring transfusions 06/03/2006   Required post operative transfusion 01/2020. Mixed anemia with hx of macrocytic B12 deficiency anemia.    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 Eulah Pont with [facility] is following.   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    Employer: [employer]    Comment: Retired in 2011  Tobacco Use   Smoking status: Former    Current packs/day: 0.00    Average packs/day: 1 pack/day for 20.0 years (20.0 ttl pk-yrs)    Types: Cigarettes    Start date: 07/06/1965    Quit date: 07/06/1985    Years since quitting: 37.5   Smokeless tobacco: Never   Tobacco comments:    Quit 1988  Vaping Use   Vaping status: Never Used  Substance and Sexual Activity   Alcohol use: Yes    Comment: 2 glasses of wine daily    Drug use: No   Sexual activity: Not on file  Other Topics Concern   Not on file  Social History Narrative    Worked as Designer, 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       Current Social History 10/18/2019        Patient lives alone in a 2 level home where laundry is in the basement. There is one step without handrail up to the entrance patient uses.      Patient's method of transportation is personal car.      The highest level of education was advanced degree; Master's in Education.      The patient currently retired.      Identified important Relationships are My daughter, good friend, Darl Pikes who is starting with dementia. Several women from church and [employer].      Pets : None       Interests / Fun: Read a lot, take walks, Yahoo with church groups and [employer] groups.       Current Stressors: "Cancer. And unable to get knee replacement last year 2/2 Covid pandemic and this year 2/2 Cancer. Hair is starting to fall out 2/2 chemo"      Religious / Personal Beliefs: "Episcopalian, Christian. I believe in God and Heaven and the Trinity."       L. Ducatte, BSN, RN-BC       Social Determinants of Health   Financial Resource Strain: Low Risk  (12/16/2022)   Overall Financial Resource Strain (CARDIA)    Difficulty of Paying Living Expenses: Not hard at all  Food Insecurity: No Food Insecurity (12/16/2022)   Hunger Vital Sign    Worried About Running Out of Food in the Last Year: Never true    Ran Out of Food in the Last Year: Never true  Transportation Needs: No Transportation Needs (12/16/2022)   PRAPARE - Administrator, Civil Service (Medical): No    Lack of Transportation (Non-Medical): No  Physical Activity: Insufficiently Active (12/16/2022)   Exercise Vital Sign    Days of Exercise per Week: 2 days    Minutes of Exercise per Session: 30 min  Stress: No Stress Concern Present (12/16/2022)   [employer] of Occupational Health - Occupational Stress Questionnaire    Feeling of Stress : Not at all  Social Connections: Not on file   Family History   Problem Relation Age of Onset   Cancer Mother        H&N, smoker   Liver disease Mother    Cancer Sister        lung, 2011, smoker   Other Other        grandmother had mastectomy in her 44's unknown reason   Macular degeneration Father    Diverticulitis Father    Breast cancer  Neg Hx     ASSESSMENT Recent Results: The most recent result is correlated with 18 mg per week: Lab Results  Component Value Date   INR 2.0 01/25/2023   INR 1.80 12/28/2022   INR 1.7 (A) 12/07/2022    Anticoagulation Dosing: Description   Take one-and-one-half (1 & 1/2) tablets of your 2mg  strength, lavender colored warfarin tablets, by mouth, once-daily.     INR today: Therapeutic  PLAN Weekly dose was increased by 17% to 21 mg per week  Patient Instructions  Patient instructed to take medications as defined in the Anti-coagulation Track section of this encounter.  Patient instructed to take today's dose.  Patient instructed to take one-and-one-half (1 & 1/2) tablets of your 2mg  strength, lavender colored warfarin tablets, by mouth, once-daily. Patient verbalized understanding of these instructions.  Patient advised to contact clinic or seek medical attention if signs/symptoms of bleeding or thromboembolism occur.  Patient verbalized understanding by repeating back information and was advised to contact me if further medication-related questions arise. Patient was also provided an information handout.  Follow-up Return in 4 weeks (on 02/22/2023) for Follow up INR.  Elicia Lamp, PharmD, CPP  15 minutes spent face-to-face with the patient during the encounter. 50% of time spent on education, including signs/sx bleeding and clotting, as well as food and drug interactions with warfarin. 50% of time was spent on fingerprick POC INR sample collection,processing, results determination, and documentation in [email or web address].

## 2023-01-25 NOTE — Patient Instructions (Signed)
Patient instructed to take medications as defined in the Anti-coagulation Track section of this encounter.  Patient instructed to take today's dose.  Patient instructed to take one-and-one-half (1 & 1/2) tablets of your 2mg strength, lavender colored warfarin tablets, by mouth, once-daily.  Patient verbalized understanding of these instructions.  

## 2023-02-02 ENCOUNTER — Other Ambulatory Visit (HOSPITAL_COMMUNITY): Payer: Self-pay

## 2023-02-02 ENCOUNTER — Telehealth: Payer: Self-pay | Admitting: Pharmacist

## 2023-02-02 ENCOUNTER — Other Ambulatory Visit: Payer: Self-pay | Admitting: Pharmacist

## 2023-02-02 ENCOUNTER — Encounter: Payer: Self-pay | Admitting: Internal Medicine

## 2023-02-02 MED ORDER — WARFARIN SODIUM 2 MG PO TABS
3.0000 mg | ORAL_TABLET | Freq: Every day | ORAL | 3 refills | Status: DC
  Filled 2023-02-02: qty 28, 18d supply, fill #0
  Filled 2023-02-23: qty 28, 18d supply, fill #1
  Filled 2023-03-17: qty 28, 18d supply, fill #2
  Filled 2023-04-07: qty 28, 18d supply, fill #3

## 2023-02-02 NOTE — Telephone Encounter (Signed)
Warfarin refill authorized and sent to [facility]. Regarding the Prolia, I will have our cost analyst determine her co-pay and let you know the co-pay amount.

## 2023-02-03 ENCOUNTER — Telehealth (HOSPITAL_COMMUNITY): Payer: Self-pay | Admitting: Pharmacy Technician

## 2023-02-03 ENCOUNTER — Other Ambulatory Visit (HOSPITAL_COMMUNITY): Payer: Self-pay

## 2023-02-03 NOTE — Telephone Encounter (Signed)
Pharmacy Patient Advocate Encounter   Received notification that prior authorization for Prolia 60MG /ML syringes is required/requested.   Insurance verification completed.   The patient is insured through [facility] .   Per test claim: PA required; PA submitted to [employer] via CoverMyMeds Key/confirmation #/EOC [id number] Status is pending

## 2023-02-03 NOTE — Telephone Encounter (Signed)
DECISION  .[name]... From  megan at  [facility] CROSS  called  our office ( PT PCP ) stated that  the  PROLIA SYRINGES  were approved  start date :   02/03/2023  to 02/03/2024...         ( WILL FAX A COPY OVER  WHEN IT IS RECEIVED AND PLACE TO SCAN TO CHART ALSO )

## 2023-02-16 ENCOUNTER — Ambulatory Visit (HOSPITAL_COMMUNITY)
Admission: RE | Admit: 2023-02-16 | Discharge: 2023-02-16 | Disposition: A | Discharge status: Home / Self Care | Payer: Medicare Other | Source: Ambulatory Visit | Attending: Urology | Admitting: Urology

## 2023-02-16 ENCOUNTER — Other Ambulatory Visit (HOSPITAL_COMMUNITY): Payer: Self-pay | Admitting: Urology

## 2023-02-16 DIAGNOSIS — C674 Malignant neoplasm of posterior wall of bladder: Secondary | ICD-10-CM

## 2023-02-17 ENCOUNTER — Other Ambulatory Visit: Payer: Self-pay

## 2023-02-17 ENCOUNTER — Other Ambulatory Visit (HOSPITAL_COMMUNITY): Payer: Self-pay

## 2023-02-17 ENCOUNTER — Other Ambulatory Visit: Payer: Self-pay | Admitting: Internal Medicine

## 2023-02-17 DIAGNOSIS — M810 Age-related osteoporosis without current pathological fracture: Secondary | ICD-10-CM

## 2023-02-17 MED ORDER — DENOSUMAB 60 MG/ML ~~LOC~~ SOSY
60.0000 mg | PREFILLED_SYRINGE | SUBCUTANEOUS | 3 refills | Status: DC
  Filled 2023-02-17 (×3): qty 1, 180d supply, fill #0
  Filled 2023-08-02 (×2): qty 1, 180d supply, fill #1
  Filled 2024-01-25: qty 1, 180d supply, fill #2

## 2023-02-18 ENCOUNTER — Other Ambulatory Visit (HOSPITAL_COMMUNITY): Payer: Self-pay

## 2023-02-22 ENCOUNTER — Ambulatory Visit: Payer: Medicare Other | Admitting: Pharmacist

## 2023-02-22 ENCOUNTER — Ambulatory Visit (INDEPENDENT_AMBULATORY_CARE_PROVIDER_SITE_OTHER): Payer: Medicare Other | Admitting: *Deleted

## 2023-02-22 DIAGNOSIS — M810 Age-related osteoporosis without current pathological fracture: Secondary | ICD-10-CM | POA: Diagnosis not present

## 2023-02-22 DIAGNOSIS — Z7901 Long term (current) use of anticoagulants: Secondary | ICD-10-CM

## 2023-02-22 DIAGNOSIS — D6869 Other thrombophilia: Secondary | ICD-10-CM

## 2023-02-22 LAB — POCT INR: INR: 3.4 — AB (ref 2.0–3.0)

## 2023-02-22 MED ORDER — DENOSUMAB 60 MG/ML ~~LOC~~ SOSY
60.0000 mg | PREFILLED_SYRINGE | Freq: Once | SUBCUTANEOUS | Status: AC
Start: 2023-02-22 — End: 2023-02-22
  Administered 2023-02-22: 60 mg via SUBCUTANEOUS

## 2023-02-22 NOTE — Patient Instructions (Signed)
Patient instructed to take medications as defined in the Anti-coagulation Track section of this encounter.  Patient instructed to take today's dose.  Patient instructed to take one-and-one-half (1 & 1/2) tablets of your 2mg  strength, lavender colored warfarin tablets, by mouth, once-daily--EXCEPT on TUESDAYS AND FRIDAYS, take ONLY ONE (1) tablet on TUESDAYS AND FRIDAYS.  Patient verbalized understanding of these instructions.

## 2023-02-22 NOTE — Progress Notes (Signed)
Anticoagulation Management Pamela [name] is a 77 y.o. female who reports to the clinic for monitoring of warfarin treatment.    Indication:  History of DVT(s), History of secondary hypercoagulable state, long term current use of an oral anticoagulant warfarin with target INR range 2.0 - 3.0.    Duration: indefinite Supervising physician:  Reymundo Poll, MD  Anticoagulation Clinic Visit History: Patient does not report signs/symptoms of bleeding or thromboembolism  Other recent changes: No diet, medications, lifestyle changes.  Anticoagulation Episode Summary     Current INR goal:  2.0-3.0  TTR:  60.5% (12.2 y)  Next INR check:  03/22/2023  INR from last check:  3.4 (02/22/2023)  Weekly max warfarin dose:  --  Target end date:  Indefinite  INR check location:  Anticoagulation Clinic  Preferred lab:  --  Send INR reminders to:  [facility]   Indications   Secondary hypercoagulable state (HCC); hx of recurrent DVTs [D68.69] DVT HX OF (Resolved) [Z86.718] Long term (current) use of anticoagulants [Z79.01]        Comments:  --        Anticoagulation Care Providers     Provider Role Specialty Phone number   Zoila Shutter, MD  Internal Medicine [phone]       Allergies  Allergen Reactions   Ace Inhibitors Cough    Current Outpatient Medications:    Cholecalciferol (VITAMIN D-3) 25 MCG (1000 UT) CAPS, Take 1 capsule by mouth daily., Disp: , Rfl:    denosumab (PROLIA) 60 MG/ML SOSY injection, Inject 60 mg into the skin every 6 (six) months., Disp: 1 mL, Rfl: 3   levothyroxine (SYNTHROID) 88 MCG tablet, Take 1 tablet (88 mcg total) by mouth daily before breakfast., Disp: 90 tablet, Rfl: 3   Magnesium 500 MG TABS, Take 1 tablet by mouth daily., Disp: , Rfl:    Multiple Vitamins-Minerals (ALGAE BASED CALCIUM) TABS, Take 500 mg by mouth daily., Disp: , Rfl:    pantoprazole (PROTONIX) 20 MG tablet, Take 1 tablet (20 mg total) by mouth daily., Disp: 90  tablet, Rfl: 3   warfarin (COUMADIN) 2 MG tablet, Take 1.5 tablets (3 mg total) by mouth daily at 4 PM., Disp: 28 tablet, Rfl: 3 Past Medical History:  Diagnosis Date   Age-related nuclear cataract, bilateral 07/14/2018   Anemia    Anemia requiring transfusions 06/03/2006   Required post operative transfusion 01/2020. Mixed anemia with hx of macrocytic B12 deficiency anemia.    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 Eulah Pont with [facility] is following.   PONV (postoperative nausea and vomiting)    S/P total knee arthroplasty, right 12/10/2020   Urothelial cancer (HCC) dx'd 09/05/2019   Social History   Socioeconomic History   Marital status: Widowed    Spouse name: Not on file   Number of children: Not on file   Years of education: Not on file   Highest education level: Not  on file  Occupational History   Occupation: Engineering geologist: [employer]    Comment: Retired in 2011  Tobacco Use   Smoking status: Former    Current packs/day: 0.00    Average packs/day: 1 pack/day for 20.0 years (20.0 ttl pk-yrs)    Types: Cigarettes    Start date: 07/06/1965    Quit date: 07/06/1985    Years since quitting: 37.6   Smokeless tobacco: Never   Tobacco comments:    Quit 1988  Vaping Use   Vaping status: Never Used  Substance and Sexual Activity   Alcohol use: Yes    Comment: 2 glasses of wine daily    Drug use: No   Sexual activity: Not on file  Other Topics  Concern   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       Current Social History 10/18/2019        Patient lives alone in a 2 level home where laundry is in the basement. There is one step without handrail up to the entrance patient uses.      Patient's method of transportation is personal car.      The highest level of education was advanced degree; Master's in Education.      The patient currently retired.      Identified important Relationships are My daughter, good friend, Darl Pikes who is starting with dementia. Several women from church and [employer].      Pets : None       Interests / Fun: Read a lot, take walks, [employer] with church groups and [employer] groups.       Current Stressors: "Cancer. And unable to get knee replacement last year 2/2 Covid pandemic and this year 2/2 Cancer. Hair is starting to fall out 2/2 chemo"      Religious / Personal Beliefs: "Episcopalian, Christian. I believe in God and Heaven and the Trinity."       L. Ducatte, BSN, RN-BC       Social Determinants of Health   Financial Resource Strain: Low Risk  (12/16/2022)   Overall Financial Resource Strain (CARDIA)    Difficulty of Paying Living Expenses: Not hard at all  Food Insecurity: No Food Insecurity (12/16/2022)   Hunger Vital Sign    Worried About Running Out of Food in the Last Year: Never true    Ran Out of Food in the Last Year: Never true  Transportation Needs: No Transportation Needs (12/16/2022)   PRAPARE - Administrator, Civil Service (Medical): No    Lack of Transportation (Non-Medical): No  Physical Activity: Insufficiently Active (12/16/2022)   Exercise Vital Sign    Days of Exercise per Week: 2 days    Minutes of Exercise per Session: 30 min  Stress: No Stress Concern Present (12/16/2022)   [employer] of Occupational Health - Occupational Stress Questionnaire    Feeling of Stress : Not at all   Social Connections: Not on file   Family History  Problem Relation Age of Onset   Cancer Mother        H&N, smoker   Liver disease Mother    Cancer Sister        lung, 2011, smoker   Other Other        grandmother had mastectomy in her 64's unknown reason   Macular degeneration Father  Diverticulitis Father    Breast cancer Neg Hx     ASSESSMENT Recent Results: The most recent result is correlated with 21 mg per week: Lab Results  Component Value Date   INR 3.4 (A) 02/22/2023   INR 2.0 01/25/2023   INR 1.80 12/28/2022    Anticoagulation Dosing: Description   Take one-and-one-half (1 & 1/2) tablets of your 2mg  strength, lavender colored warfarin tablets, by mouth, once-daily--EXCEPT on TUESDAYS AND FRIDAYS, take ONLY ONE (1) tablet on TUESDAYS AND FRIDAYS.      INR today: Supratherapeutic  PLAN Weekly dose was decreased by 5% to 19 mg per week  Patient Instructions  Patient instructed to take medications as defined in the Anti-coagulation Track section of this encounter.  Patient instructed to take today's dose.  Patient instructed to take one-and-one-half (1 & 1/2) tablets of your 2mg  strength, lavender colored warfarin tablets, by mouth, once-daily--EXCEPT on TUESDAYS AND FRIDAYS, take ONLY ONE (1) tablet on TUESDAYS AND FRIDAYS.  Patient verbalized understanding of these instructions.  Patient advised to contact clinic or seek medical attention if signs/symptoms of bleeding or thromboembolism occur.  Patient verbalized understanding by repeating back information and was advised to contact me if further medication-related questions arise. Patient was also provided an information handout.  Follow-up Return in 4 weeks (on 03/22/2023) for Follow up INR.  Elicia Lamp, PharmD, CPP  15 minutes spent face-to-face with the patient during the encounter. 50% of time spent on education, including signs/sx bleeding and clotting, as well as food and drug interactions with  warfarin. 50% of time was spent on fingerprick POC INR sample collection,processing, results determination, and documentation in [email or web address].

## 2023-02-22 NOTE — Progress Notes (Signed)
Pt's medication was delivered to our office last week. Pt was monitor after Prolia was given - no adverse side effects noted.

## 2023-02-24 ENCOUNTER — Ambulatory Visit: Payer: Medicare Other

## 2023-03-04 ENCOUNTER — Telehealth: Payer: Self-pay

## 2023-03-04 NOTE — Telephone Encounter (Signed)
Pt called left a vmail on [facility] vmail  stating she is having a gout flare  up and is in ned of medicine ... She stated that she has been taking  ibuprofen but it is not working

## 2023-03-05 NOTE — Telephone Encounter (Signed)
Called Pamela [name] - toe gout has improved with increasing water intake.  Now has Covid illness, mild symptoms.  Rec. Ibuprofen 400 mg bid-tid for short term symptoms relief of both problems.

## 2023-03-08 NOTE — Progress Notes (Deleted)
[name] OFFICE PROGRESS NOTE  Pamela Aschoff, MD [address] [place] [address]  DIAGNOSIS:  77 year old with bladder cancer diagnosed in 2021.  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  2) Neoadjuvant chemotherapy with gemcitabine and cisplatin started on March 24th 2021. She completed 3 cycles of therapy on Nov 14, 2019.  3) She is status post robotic assisted laparoscopic cystectomy and lymphadenectomy completed on January 17, 2020. She had a complete response to therapy with no residual disease on surgery.   CURRENT THERAPY: Observation   INTERVAL HISTORY: [name] 77 y.o. female returns to the clinic today for a follow up visit. She was previously followed by [name], who has since left the practice. She is here to establish care with [name] and myself. She was last seen by [name] one year ago. Returns to the clinic for a follow up visit. The patient is feeling well today without any concerning complaints. She follows with *** from urology. Their most recent appointment was on ***. Denies any fever, chills, night sweats, or weight loss. Denies any chest pain, shortness of breath, cough, or hemoptysis. Denies any nausea, vomiting, diarrhea, or constipation. Denies any headache or visual changes. Denies any rashes or skin changes. Denies dysuria, malodorous urine, abdominal pain, flank pain, or hematuria. The patient is here today for evaluation and repeat blood work.     MEDICAL HISTORY: Past Medical History:  Diagnosis Date   Age-related nuclear cataract, bilateral 07/14/2018   Anemia    Anemia requiring transfusions 06/03/2006   Required post operative transfusion 01/2020. Mixed anemia with hx of macrocytic B12 deficiency anemia.    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 Eulah Pont with [facility] is following.   PONV (postoperative nausea and vomiting)    S/P total knee arthroplasty, right 12/10/2020   Urothelial cancer (HCC) dx'd 09/05/2019    ALLERGIES:  is allergic to ace inhibitors.  MEDICATIONS:  Current Outpatient Medications  Medication Sig Dispense Refill   Cholecalciferol (VITAMIN D-3) 25 MCG (1000 UT) CAPS Take 1 capsule by mouth daily.     denosumab (PROLIA) 60 MG/ML SOSY injection Inject 60 mg into the skin every 6 (six) months. 1 mL 3   levothyroxine (SYNTHROID) 88 MCG tablet Take 1 tablet (88 mcg total) by mouth daily before breakfast. 90 tablet 3   Magnesium 500 MG TABS Take 1 tablet by mouth daily.     Multiple Vitamins-Minerals (ALGAE BASED CALCIUM) TABS Take 500 mg by mouth daily.     pantoprazole (PROTONIX) 20 MG tablet Take 1 tablet (20 mg total) by mouth daily. 90 tablet 3   warfarin (COUMADIN) 2 MG tablet Take 1.5 tablets (3 mg total) by mouth daily at 4 PM.  28 tablet 3   No current facility-administered medications for this visit.    SURGICAL HISTORY:  Past Surgical History:  Procedure Laterality Date   ANKLE ARTHROPLASTY Right 2008   CHOLECYSTECTOMY     2004   CYSTOSCOPY WITH INJECTION N/A 01/17/2020   Procedure: CYSTOSCOPY WITH INJECTION;  Surgeon: Sebastian Ache, MD;  Location: [facility];  Service: Urology;  Laterality: N/A;   HAND CONTRACTURE RELEASE Left 1993   IR IMAGING GUIDED PORT INSERTION   09/20/2019   IR REMOVAL TUN ACCESS W/ PORT W/O FL MOD SED  09/06/2020   KNEE ARTHROSCOPY Right 2004   OVARY SURGERY Right 1998   ROBOT ASSISTED LAPAROSCOPIC COMPLETE CYSTECT ILEAL CONDUIT N/A 01/17/2020   Procedure: XI ROBOTIC ASSISTED LAPAROSCOPIC COMPLETE CYSTECT ILEAL CONDUIT, XI ROBOTIC ASSISTED LAPAPRSCOPIC HYSTERECTOMY, LEFT OOPHERECTOMY,SALPINGECTOMY;LYMPHADENECTOMY;  Surgeon: Sebastian Ache, MD;  Location: [facility];  Service: Urology;  Laterality: N/A;  6 HRS   TONSILLECTOMY     TOTAL KNEE ARTHROPLASTY Right 12/10/2020   Procedure: TOTAL KNEE ARTHROPLASTY;  Surgeon: Sheral Apley, MD;  Location: [facility];  Service: Orthopedics;  Laterality: Right;   TRANSURETHRAL RESECTION OF BLADDER TUMOR WITH MITOMYCIN-C N/A 08/28/2019   Procedure: TRANSURETHRAL RESECTION OF BLADDER TUMOR;  Surgeon: Crista Elliot, MD;  Location: [facility];  Service: Urology;  Laterality: N/A;   wisdom teeth extractcion       REVIEW OF SYSTEMS:   Review of Systems  Constitutional: Negative for appetite change, chills, fatigue, fever and unexpected weight change.  HENT:   Negative for mouth sores, nosebleeds, sore throat and trouble swallowing.   Eyes: Negative for eye problems and icterus.  Respiratory: Negative for cough, hemoptysis, shortness of breath and wheezing.   Cardiovascular: Negative for chest pain and leg swelling.  Gastrointestinal: Negative for abdominal pain, constipation, diarrhea, nausea and vomiting.  Genitourinary: Negative for bladder incontinence, difficulty urinating, dysuria, frequency and hematuria.   Musculoskeletal: Negative for back pain, gait problem, neck pain and neck stiffness.  Skin: Negative for itching and rash.  Neurological: Negative for dizziness, extremity weakness, gait problem, headaches, light-headedness and seizures.  Hematological: Negative for adenopathy. Does not bruise/bleed easily.  Psychiatric/Behavioral: Negative for confusion, depression and sleep disturbance. The  patient is not nervous/anxious.     PHYSICAL EXAMINATION:  There were no vitals taken for this visit.  ECOG PERFORMANCE STATUS: {CHL ONC ECOG [id number]  Physical Exam  Constitutional: Oriented to person, place, and time and well-developed, well-nourished, and in no distress. No distress.  HENT:  Head: Normocephalic and atraumatic.  Mouth/Throat: Oropharynx is clear and moist. No oropharyngeal exudate.  Eyes: Conjunctivae are normal. Right eye exhibits no discharge. Left eye exhibits no discharge. No scleral icterus.  Neck: Normal range of motion. Neck supple.  Cardiovascular: Normal rate, regular rhythm, normal heart sounds and intact distal pulses.   Pulmonary/Chest: Effort normal and breath sounds normal. No respiratory distress. No wheezes. No rales.  Abdominal: Soft. Bowel sounds are normal. Exhibits no distension and no mass. There is no tenderness.  Musculoskeletal: Normal range of motion. Exhibits no edema.  Lymphadenopathy:    No cervical adenopathy.  Neurological: Alert and oriented to person, place, and time. Exhibits normal muscle tone. Gait normal. Coordination normal.  Skin: Skin is warm and dry. No rash noted. Not diaphoretic. No erythema. No pallor.  Psychiatric: Mood, memory and judgment normal.  Vitals reviewed.  LABORATORY DATA: Lab Results  Component Value Date   WBC 7.3 08/27/2021   HGB 11.8 (L) 08/27/2021   HCT  35.2 (L) 08/27/2021   MCV 99.7 08/27/2021   PLT 248 08/27/2021      Chemistry      Component Value Date/Time   NA 140 12/31/2022 1106   K 5.3 (H) 12/31/2022 1106   CL 100 12/31/2022 1106   CO2 21 12/31/2022 1106   BUN 22 12/31/2022 1106   CREATININE 1.23 (H) 12/31/2022 1106   CREATININE 1.23 (H) 08/27/2021 1007   CREATININE 0.78 12/13/2014 0954      Component Value Date/Time   CALCIUM 10.0 12/31/2022 1106   ALKPHOS 47 08/27/2021 1007   AST 17 08/27/2021 1007   ALT 10 08/27/2021 1007   BILITOT 0.8 08/27/2021 1007        RADIOGRAPHIC STUDIES:  DG Chest 2 View  Result Date: 02/22/2023 CLINICAL DATA:  neoplasm of bladder EXAM: CHEST - 2 VIEW COMPARISON:  03/04/2022. FINDINGS: The heart size and mediastinal contours are within normal limits. Both lungs are clear. No pneumothorax or pleural effusion. There are thoracic degenerative changes. IMPRESSION: No active cardiopulmonary disease. Electronically Signed   By: Layla Maw M.D.   On: 02/22/2023 10:56     ASSESSMENT/PLAN:  This is a very pleasant 77 year old female with a history of  Bladder cancer diagnosed in 2021.  She was found to have T2N0 urothelial carcinoma with small cell features and achieved a complete response to chemotherapy and radical cystectomy.   She is here to establish care.   The patient was seen with [name]. Labs were reviewed. [name] recommends that she have a staging CT scan performed. If negative, then ***  The patient was advised to call immediately if she has any concerning symptoms in the interval. The patient voices understanding of current disease status and treatment options and is in agreement with the current care plan. All questions were answered. The patient knows to call the clinic with any problems, questions or concerns. We can certainly see the patient much sooner if necessary      No orders of the defined types were placed in this encounter.    I spent {CHL ONC TIME VISIT - [id number]} counseling the patient face to face. The total time spent in the appointment was {CHL ONC TIME VISIT - VQQVZ:[id number]}.  Cason Luffman L Jaina Morin, PA-C 03/08/23

## 2023-03-09 ENCOUNTER — Telehealth: Payer: Self-pay | Admitting: *Deleted

## 2023-03-09 NOTE — Telephone Encounter (Signed)
TCT patient at provider C. Heilingoetter PA request. Pt had Covid symptoms last week, ask when she tested positive.  Patient states she had mild symptoms last week and tested positive for Covid on 03/04/23. Provider informed.  Per provider, pt needs to cancel appt at [facility] on 03/11/23 and delay until at least 10 days out of testing positive. Patient informed and verbalized understanding. She said has a coumadin clinic appt at [facility] on 9/16 at 915, so said she [facility] am.   Schedule message sent with above information

## 2023-03-11 ENCOUNTER — Ambulatory Visit: Payer: Medicare Other | Admitting: Physician Assistant

## 2023-03-11 ENCOUNTER — Ambulatory Visit: Payer: Medicare Other | Admitting: Oncology

## 2023-03-19 ENCOUNTER — Telehealth: Payer: Self-pay | Admitting: Internal Medicine

## 2023-03-19 NOTE — Telephone Encounter (Signed)
Called patient regarding upcoming September appointments, patient is notified.  

## 2023-03-22 ENCOUNTER — Ambulatory Visit: Payer: Medicare Other | Admitting: Pharmacist

## 2023-03-22 DIAGNOSIS — D6869 Other thrombophilia: Secondary | ICD-10-CM | POA: Diagnosis not present

## 2023-03-22 DIAGNOSIS — Z86718 Personal history of other venous thrombosis and embolism: Secondary | ICD-10-CM | POA: Diagnosis not present

## 2023-03-22 DIAGNOSIS — Z7901 Long term (current) use of anticoagulants: Secondary | ICD-10-CM | POA: Diagnosis not present

## 2023-03-22 LAB — POCT INR: INR: 3.1 — AB (ref 2.0–3.0)

## 2023-03-22 NOTE — Patient Instructions (Signed)
Patient instructed to take medications as defined in the Anti-coagulation Track section of this encounter.  Patient instructed to take today's dose.  Patient instructed to take  one (1) of your 2 mg strength lavender colored warfarin tablets on Mondays, Wednesdays and Fridays. All other days, Sundays, Tuesdays, Thursdays, and Saturdays--take one-and-one-half (1 & 1/2) tablets.  Patient verbalized understanding of these instructions.

## 2023-03-22 NOTE — Progress Notes (Signed)
Anticoagulation Management [name] is a 77 y.o. female who reports to the clinic for monitoring of warfarin treatment.    Indication: DVT , History of; Secondary hypercoagulable state, long term current use of oral anticoagulant--warfarin. Target INR 2.0 - 3.0.  Duration: indefinite Supervising physician: Debe Coder  Anticoagulation Clinic Visit History: Patient does not report signs/symptoms of bleeding or thromboembolism  Other recent changes: No diet, medications, lifestyle changes.  Anticoagulation Episode Summary     Current INR goal:  2.0-3.0  TTR:  60.1% (12.2 y)  Next INR check:  04/19/2023  INR from last check:  3.1 (03/22/2023)  Weekly max warfarin dose:  --  Target end date:  Indefinite  INR check location:  Anticoagulation Clinic  Preferred lab:  --  Send INR reminders to:  [facility]   Indications   Secondary hypercoagulable state (HCC); hx of recurrent DVTs [D68.69] DVT HX OF (Resolved) [Z86.718] Long term (current) use of anticoagulants [Z79.01]        Comments:  --        Anticoagulation Care Providers     Provider Role Specialty Phone number   Zoila Shutter, MD  Internal Medicine [phone]       Allergies  Allergen Reactions   Ace Inhibitors Cough    Current Outpatient Medications:    Cholecalciferol (VITAMIN D-3) 25 MCG (1000 UT) CAPS, Take 1 capsule by mouth daily., Disp: , Rfl:    denosumab (PROLIA) 60 MG/ML SOSY injection, Inject 60 mg into the skin every 6 (six) months., Disp: 1 mL, Rfl: 3   levothyroxine (SYNTHROID) 88 MCG tablet, Take 1 tablet (88 mcg total) by mouth daily before breakfast., Disp: 90 tablet, Rfl: 3   Magnesium 500 MG TABS, Take 1 tablet by mouth daily., Disp: , Rfl:    Multiple Vitamins-Minerals (ALGAE BASED CALCIUM) TABS, Take 500 mg by mouth daily., Disp: , Rfl:    pantoprazole (PROTONIX) 20 MG tablet, Take 1 tablet (20 mg total) by mouth daily., Disp: 90 tablet, Rfl: 3   warfarin (COUMADIN) 2 MG  tablet, Take 1.5 tablets (3 mg total) by mouth daily at 4 PM., Disp: 28 tablet, Rfl: 3 Past Medical History:  Diagnosis Date   Age-related nuclear cataract, bilateral 07/14/2018   Anemia    Anemia requiring transfusions 06/03/2006   Required post operative transfusion 01/2020. Mixed anemia with hx of macrocytic B12 deficiency anemia.    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 Eulah Pont with [facility] is following.   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    Employer: [employer]    Comment: Retired in 2011  Tobacco Use   Smoking status: Former    Current packs/day: 0.00    Average packs/day: 1 pack/day for 20.0 years (20.0 ttl pk-yrs)    Types: Cigarettes    Start date: 07/06/1965    Quit date: 07/06/1985    Years since quitting: 37.7   Smokeless tobacco: Never   Tobacco comments:    Quit 1988  Vaping Use   Vaping status: Never Used  Substance and Sexual Activity   Alcohol use: Yes    Comment: 2 glasses of wine daily    Drug use: No   Sexual activity: Not on file  Other Topics Concern   Not on file  Social History  Narrative   Worked as Designer, 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       Current Social History 10/18/2019        Patient lives alone in a 2 level home where laundry is in the basement. There is one step without handrail up to the entrance patient uses.      Patient's method of transportation is personal car.      The highest level of education was advanced degree; Master's in Education.      The patient currently retired.      Identified important Relationships are My daughter, good friend, Darl Pikes who is starting with dementia. Several women from church and [employer].      Pets : None       Interests / Fun: Read a lot, take walks, [employer] with church groups and [employer] groups.       Current Stressors: "Cancer. And unable to get knee replacement last year 2/2 Covid pandemic and this year 2/2 Cancer. Hair is starting to fall out 2/2 chemo"      Religious / Personal Beliefs: "Episcopalian, Christian. I believe in God and Heaven and the Trinity."       L. Ducatte, BSN, RN-BC       Social Determinants of Health   Financial Resource Strain: Low Risk  (12/16/2022)   Overall Financial Resource Strain (CARDIA)    Difficulty of Paying Living Expenses: Not hard at all  Food Insecurity: No Food Insecurity (12/16/2022)   Hunger Vital Sign    Worried About Running Out of Food in the Last Year: Never true    Ran Out of Food in the Last Year: Never true  Transportation Needs: No Transportation Needs (12/16/2022)   PRAPARE - Administrator, Civil Service (Medical): No    Lack of Transportation (Non-Medical): No  Physical Activity: Insufficiently Active (12/16/2022)   Exercise Vital Sign    Days of Exercise per Week: 2 days    Minutes of Exercise per Session: 30 min  Stress: No Stress Concern Present (12/16/2022)   [employer] of Occupational Health - Occupational Stress Questionnaire    Feeling of Stress : Not at all  Social Connections: Not on file   Family  History  Problem Relation Age of Onset   Cancer Mother        H&N, smoker   Liver disease Mother    Cancer Sister        lung, 2011, smoker   Other Other        grandmother had mastectomy in her 43's unknown reason   Macular degeneration Father    Diverticulitis Father    Breast cancer  Neg Hx     ASSESSMENT Recent Results: The most recent result is correlated with 19 mg per week: Lab Results  Component Value Date   INR 3.1 (A) 03/22/2023   INR 3.4 (A) 02/22/2023   INR 2.0 01/25/2023    Anticoagulation Dosing: Description   Take one (1) of your 2 mg strength lavender colored warfarin tablets on Mondays, Wednesdays and Fridays. All other days, Sundays, Tuesdays, Thursdays, and Saturdays--take one-and-one-half (1 & 1/2) tablets.      INR today: Supratherapeutic  PLAN Weekly dose was decreased by 5% to 18 mg per week  Patient Instructions  Patient instructed to take medications as defined in the Anti-coagulation Track section of this encounter.  Patient instructed to take today's dose.  Patient instructed to take  one (1) of your 2 mg strength lavender colored warfarin tablets on Mondays, Wednesdays and Fridays. All other days, Sundays, Tuesdays, Thursdays, and Saturdays--take one-and-one-half (1 & 1/2) tablets.  Patient verbalized understanding of these instructions.  Patient advised to contact clinic or seek medical attention if signs/symptoms of bleeding or thromboembolism occur.  Patient verbalized understanding by repeating back information and was advised to contact me if further medication-related questions arise. Patient was also provided an information handout.  Follow-up Return in 4 weeks (on 04/19/2023) for Follow up INR.  [name] Jamilya Sarrazin  15 minutes spent face-to-face with the patient during the encounter. 50% of time spent on education, including signs/sx bleeding and clotting, as well as food and drug interactions with warfarin. 50% of time was spent on  fingerprick POC INR sample collection,processing, results determination, and documentation in [email or web address].

## 2023-03-24 NOTE — Progress Notes (Signed)
[facility] OFFICE PROGRESS NOTE  Miguel Aschoff, MD [address] [place] [address]  DIAGNOSIS:  77 year old with bladder cancer diagnosed in 2021.  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  2) Neoadjuvant chemotherapy with gemcitabine and cisplatin started on March 24th 2021. She completed 3 cycles of therapy on Nov 14, 2019.  3) She is status post robotic assisted laparoscopic cystectomy and lymphadenectomy completed on January 17, 2020. She had a complete response to therapy with no residual disease on surgery.   CURRENT THERAPY: Observation   INTERVAL HISTORY: Pamela [name] 77 y.o. female returns to the clinic today for a follow up visit. She was previously followed by Dr. Clelia Croft, who has since left the practice. She is here to establish care with Dr. Arbutus Ped and myself. She was last seen by Dr. Clelia Croft one year ago. She returns to the clinic for a follow up visit. The patient is feeling well today without any concerning complaints. She follows with Dr. Berneice Heinrich from urology. Their most recent appointment was on August 6th, 2024. She recently had a restaging CT scan which did not show any evidence of disease progression. It also mentions she is s/p cholecystectomy with dilated common duct and pneumobilia in the left hepatic lobe and associated choledocholithiasis with two 9 mm CBD calculi. It said ERCP could be considered. She is unsure if she was referred to GI. She has seen Dr. Ewing Schlein in the past at [facility]. Denies any fever, chills, night sweats, or recent weight loss. Denies any chest pain, shortness of breath, cough, or hemoptysis. Denies any nausea, vomiting, diarrhea, or constipation. She had some issues with her ostomy but was able to see the ostomy clinic which helped. Denies any  headache or visual changes. Denies any rashes or skin changes. Denies dysuria, malodorous urine, abdominal pain, flank pain, or hematuria. The patient is here today for evaluation and repeat blood work.     MEDICAL HISTORY: Past Medical History:  Diagnosis Date   Age-related nuclear cataract, bilateral 07/14/2018   Anemia    Anemia requiring transfusions 06/03/2006   Required post operative transfusion 01/2020. Mixed anemia with hx of macrocytic B12 deficiency anemia.    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 Eulah Pont with [facility] is following.   PONV (postoperative nausea and vomiting)    S/P total knee arthroplasty, right 12/10/2020   Urothelial cancer (HCC) dx'd 09/05/2019    ALLERGIES:  is allergic to ace inhibitors.  MEDICATIONS:  Current Outpatient Medications  Medication Sig Dispense Refill   CALCIUM PO Take by mouth.     Cholecalciferol (VITAMIN D-3) 25 MCG (1000 UT) CAPS Take 1 capsule by mouth daily.     denosumab (PROLIA) 60 MG/ML SOSY injection Inject 60  mg into the skin every 6 (six) months. 1 mL 3   levothyroxine (SYNTHROID) 88 MCG tablet Take 1 tablet (88 mcg total) by mouth daily before breakfast. 90 tablet 3   Magnesium 250 MG TABS Take 1 tablet by mouth daily.     Multiple Vitamins-Minerals (ALGAE BASED CALCIUM) TABS Take 500 mg by mouth daily.     pantoprazole (PROTONIX) 20 MG tablet Take 1 tablet (20 mg total) by mouth daily. 90 tablet 3   warfarin (COUMADIN) 2  MG tablet Take 1.5 tablets (3 mg total) by mouth daily at 4 PM. 28 tablet 3   No current facility-administered medications for this visit.    SURGICAL HISTORY:  Past Surgical History:  Procedure Laterality Date   ANKLE ARTHROPLASTY Right 2008   CHOLECYSTECTOMY     2004   CYSTOSCOPY WITH INJECTION N/A 01/17/2020   Procedure: CYSTOSCOPY WITH INJECTION;  Surgeon: Sebastian Ache, MD;  Location: [facility];  Service: Urology;  Laterality: N/A;   HAND CONTRACTURE RELEASE Left 1993   IR IMAGING GUIDED PORT INSERTION  09/20/2019   IR REMOVAL TUN ACCESS W/ PORT W/O FL MOD SED  09/06/2020   KNEE ARTHROSCOPY Right 2004   OVARY SURGERY Right 1998   ROBOT ASSISTED LAPAROSCOPIC COMPLETE CYSTECT ILEAL CONDUIT N/A 01/17/2020   Procedure: XI ROBOTIC ASSISTED LAPAROSCOPIC COMPLETE CYSTECT ILEAL CONDUIT, XI ROBOTIC ASSISTED LAPAPRSCOPIC HYSTERECTOMY, LEFT OOPHERECTOMY,SALPINGECTOMY;LYMPHADENECTOMY;  Surgeon: Sebastian Ache, MD;  Location: [facility];  Service: Urology;  Laterality: N/A;  6 HRS   TONSILLECTOMY     TOTAL KNEE ARTHROPLASTY Right 12/10/2020   Procedure: TOTAL KNEE ARTHROPLASTY;  Surgeon: Sheral Apley, MD;  Location: [facility];  Service: Orthopedics;  Laterality: Right;   TRANSURETHRAL RESECTION OF BLADDER TUMOR WITH MITOMYCIN-C N/A 08/28/2019   Procedure: TRANSURETHRAL RESECTION OF BLADDER TUMOR;  Surgeon: Crista Elliot, MD;  Location: [facility];  Service: Urology;  Laterality: N/A;   wisdom teeth extractcion       REVIEW OF SYSTEMS:   Review of Systems  Constitutional: Negative for appetite change, chills, fatigue, fever and unexpected weight change.  HENT:   Negative for mouth sores, nosebleeds, sore throat and trouble swallowing.   Eyes: Negative for eye problems and icterus.  Respiratory: Negative for cough, hemoptysis, shortness of breath and wheezing.   Cardiovascular: Negative for chest pain and leg swelling.  Gastrointestinal: Negative for abdominal pain, constipation, diarrhea, nausea and  vomiting.  Genitourinary: Negative for bladder incontinence, difficulty urinating, dysuria, frequency and hematuria.   Musculoskeletal: Negative for back pain, gait problem, neck pain and neck stiffness.  Skin: Negative for itching and rash.  Neurological: Negative for dizziness, extremity weakness, gait problem, headaches, light-headedness and seizures.  Hematological: Negative for adenopathy. Does not bruise/bleed easily.  Psychiatric/Behavioral: Negative for confusion, depression and sleep disturbance. The patient is not nervous/anxious.     PHYSICAL EXAMINATION:  There were no vitals taken for this visit.  ECOG PERFORMANCE STATUS: 0  Physical Exam  Constitutional: Oriented to person, place, and time and well-developed, well-nourished, and in no distress.   HENT:  Head: Normocephalic and atraumatic.  Mouth/Throat: Oropharynx is clear and moist. No oropharyngeal exudate.  Eyes: Conjunctivae are normal. Right eye exhibits no discharge. Left eye exhibits no discharge. No scleral icterus.  Neck: Normal range of motion. Neck supple.  Cardiovascular: Normal rate, regular rhythm, normal heart sounds and intact distal pulses.   Pulmonary/Chest: Effort normal and breath sounds normal. No respiratory distress. No wheezes. No rales.  Abdominal: Soft. Bowel sounds  are normal. Exhibits no distension and no mass. There is no tenderness. Ostomy present. Musculoskeletal: Normal range of motion. Exhibits no edema.  Lymphadenopathy:    No cervical adenopathy.  Neurological: Alert and oriented to person, place, and time. Exhibits normal muscle tone. Gait normal. Coordination normal.  Skin: Skin is warm and dry. No rash noted. Not diaphoretic. No erythema. No pallor.  Psychiatric: Mood, memory and judgment normal.  Vitals reviewed.  LABORATORY DATA: Lab Results  Component Value Date   WBC 5.5 03/29/2023   HGB 12.5 03/29/2023   HCT 38.0 03/29/2023   MCV 100.8 (H) 03/29/2023   PLT 243 03/29/2023       Chemistry      Component Value Date/Time   NA 136 03/29/2023 1453   NA 140 12/31/2022 1106   K 5.1 03/29/2023 1453   CL 102 03/29/2023 1453   CO2 28 03/29/2023 1453   BUN 21 03/29/2023 1453   BUN 22 12/31/2022 1106   CREATININE 1.24 (H) 03/29/2023 1453   CREATININE 1.23 (H) 08/27/2021 1007   CREATININE 0.78 12/13/2014 0954      Component Value Date/Time   CALCIUM 9.6 03/29/2023 1453   ALKPHOS 50 03/29/2023 1453   AST 17 03/29/2023 1453   AST 17 08/27/2021 1007   ALT 9 03/29/2023 1453   ALT 10 08/27/2021 1007   BILITOT 0.7 03/29/2023 1453   BILITOT 0.8 08/27/2021 1007       RADIOGRAPHIC STUDIES:  No results found.   ASSESSMENT/PLAN:  This is a very pleasant 77 year old female with a history of  Bladder cancer diagnosed in 2021.  She was found to have T2N0 urothelial carcinoma with small cell features and achieved a complete response to chemotherapy and radical cystectomy.   She is here to establish care. She was previously followed by Dr. Clelia Croft.  The patient was seen with Dr. Arbutus Ped. Labs were reviewed. She recently had restaging CT in August 2024 which did not show disease progression. The scan incidentally mentioned choledocholithiasis. She was not sure if urology was going to refer her to GI to address this. She has seen Dr. Ewing Schlein in the past. She is asymptomatic at this time.   WE will see her back for labs and follow up in 1 year. She will also see Dr. Berneice Heinrich with CT scan next year.   Regarding the choledocholithiasis, I recommended that she call urology and see if they can fax the results of her CT scan to GI so she can get input from GI regarding this. Since the patient is established with GI, she can also call them as well to see if new referral is needed.   The patient was advised to call immediately if she has any concerning symptoms in the interval. The patient voices understanding of current disease status and treatment options and is in agreement with  the current care plan. All questions were answered. The patient knows to call the clinic with any problems, questions or concerns. We can certainly see the patient much sooner if necessary      No orders of the defined types were placed in this encounter.   [place] Mullett L Tyshauna Finkbiner, PA-C 03/29/23  ADDENDUM: Hematology/Oncology Attending:  I had a face-to-face encounter with the patient today.  I reviewed her records, lab, scan and recommended her care plan.  This is a very pleasant 77 years old white female diagnosed with T2 N0 high-grade urothelial carcinoma with small cell feature in 2021.  She is status post  TURBT in February 2021 that showed mixed with urothelial carcinoma with small cell component.  The patient underwent neoadjuvant systemic chemotherapy with cisplatin and gemcitabine for 3 cycles followed by laparoscopic ectomy and lymphadenectomy on January 17, 2020 that showed complete response with no residual disease on surgery.  She has been on observation since that time.  She was seen previously by Dr. Clelia Croft before he left the practice.  She is here today for evaluation and to establish care with me after his departure. She had repeat CT scan of the abdomen and pelvis performed at the urology center last week and that showed no concerning findings for disease recurrence or metastasis. I recommended for the patient to continue on observation with repeat blood work and scan in 1 year. She was advised to call immediately if she has any other concerning symptoms in the interval. The total time spent in the appointment was 30 minutes. Disclaimer: This note was dictated with voice recognition software. Similar sounding words can inadvertently be transcribed and may be missed upon review. Lajuana Matte, MD 03/29/23

## 2023-03-27 ENCOUNTER — Encounter (HOSPITAL_COMMUNITY): Payer: Self-pay

## 2023-03-29 ENCOUNTER — Inpatient Hospital Stay: Payer: Medicare Other

## 2023-03-29 ENCOUNTER — Inpatient Hospital Stay: Payer: Medicare Other | Attending: Oncology | Admitting: Physician Assistant

## 2023-03-29 DIAGNOSIS — Z9221 Personal history of antineoplastic chemotherapy: Secondary | ICD-10-CM | POA: Diagnosis not present

## 2023-03-29 DIAGNOSIS — C689 Malignant neoplasm of urinary organ, unspecified: Secondary | ICD-10-CM

## 2023-03-29 DIAGNOSIS — Z8551 Personal history of malignant neoplasm of bladder: Secondary | ICD-10-CM | POA: Insufficient documentation

## 2023-03-29 LAB — CBC WITH DIFFERENTIAL/PLATELET
Abs Immature Granulocytes: 0.04 10*3/uL (ref 0.00–0.07)
Basophils Absolute: 0 10*3/uL (ref 0.0–0.1)
Basophils Relative: 1 %
Eosinophils Absolute: 0.2 10*3/uL (ref 0.0–0.5)
Eosinophils Relative: 3 %
HCT: 38 % (ref 36.0–46.0)
Hemoglobin: 12.5 g/dL (ref 12.0–15.0)
Immature Granulocytes: 1 %
Lymphocytes Relative: 23 %
Lymphs Abs: 1.3 10*3/uL (ref 0.7–4.0)
MCH: 33.2 pg (ref 26.0–34.0)
MCHC: 32.9 g/dL (ref 30.0–36.0)
MCV: 100.8 fL — ABNORMAL HIGH (ref 80.0–100.0)
Monocytes Absolute: 0.7 10*3/uL (ref 0.1–1.0)
Monocytes Relative: 12 %
Neutro Abs: 3.3 10*3/uL (ref 1.7–7.7)
Neutrophils Relative %: 60 %
Platelets: 243 10*3/uL (ref 150–400)
RBC: 3.77 MIL/uL — ABNORMAL LOW (ref 3.87–5.11)
RDW: 12.8 % (ref 11.5–15.5)
WBC: 5.5 10*3/uL (ref 4.0–10.5)
nRBC: 0 % (ref 0.0–0.2)

## 2023-03-29 LAB — COMPREHENSIVE METABOLIC PANEL
ALT: 9 U/L (ref 0–44)
AST: 17 U/L (ref 15–41)
Albumin: 4.1 g/dL (ref 3.5–5.0)
Alkaline Phosphatase: 50 U/L (ref 38–126)
Anion gap: 6 (ref 5–15)
BUN: 21 mg/dL (ref 8–23)
CO2: 28 mmol/L (ref 22–32)
Calcium: 9.6 mg/dL (ref 8.9–10.3)
Chloride: 102 mmol/L (ref 98–111)
Creatinine, Ser: 1.24 mg/dL — ABNORMAL HIGH (ref 0.44–1.00)
GFR, Estimated: 44 mL/min — ABNORMAL LOW (ref >60)
Glucose, Bld: 117 mg/dL — ABNORMAL HIGH (ref 70–99)
Potassium: 5.1 mmol/L (ref 3.5–5.1)
Sodium: 136 mmol/L (ref 135–145)
Total Bilirubin: 0.7 mg/dL (ref 0.3–1.2)
Total Protein: 7.4 g/dL (ref 6.5–8.1)

## 2023-04-19 ENCOUNTER — Ambulatory Visit (INDEPENDENT_AMBULATORY_CARE_PROVIDER_SITE_OTHER): Payer: Medicare Other | Admitting: Pharmacist

## 2023-04-19 ENCOUNTER — Other Ambulatory Visit: Payer: Self-pay

## 2023-04-19 ENCOUNTER — Other Ambulatory Visit (HOSPITAL_COMMUNITY): Payer: Self-pay

## 2023-04-19 DIAGNOSIS — D6869 Other thrombophilia: Secondary | ICD-10-CM | POA: Diagnosis not present

## 2023-04-19 DIAGNOSIS — Z7901 Long term (current) use of anticoagulants: Secondary | ICD-10-CM | POA: Diagnosis not present

## 2023-04-19 DIAGNOSIS — E039 Hypothyroidism, unspecified: Secondary | ICD-10-CM

## 2023-04-19 LAB — POCT INR: INR: 2.4 (ref 2.0–3.0)

## 2023-04-19 MED ORDER — LEVOTHYROXINE SODIUM 88 MCG PO TABS
88.0000 ug | ORAL_TABLET | Freq: Every day | ORAL | 3 refills | Status: DC
Start: 2023-04-19 — End: 2024-04-12
  Filled 2023-04-19: qty 90, 90d supply, fill #0
  Filled 2023-07-13: qty 90, 90d supply, fill #1
  Filled 2023-10-17: qty 90, 90d supply, fill #2
  Filled 2024-01-17: qty 90, 90d supply, fill #3

## 2023-04-19 MED ORDER — WARFARIN SODIUM 2 MG PO TABS
ORAL_TABLET | ORAL | 3 refills | Status: DC
Start: 2023-04-19 — End: 2023-08-19
  Filled 2023-04-19: qty 36, 30d supply, fill #0
  Filled 2023-04-29: qty 36, 28d supply, fill #0
  Filled 2023-05-29: qty 36, 28d supply, fill #1
  Filled 2023-06-21: qty 36, 28d supply, fill #2
  Filled 2023-07-22: qty 36, 28d supply, fill #3

## 2023-04-19 NOTE — Patient Instructions (Signed)
Patient instructed to take medications as defined in the Anti-coagulation Track section of this encounter.  Patient instructed to take today's dose.  Patient instructed to take  one (1) of your 2 mg strength lavender colored warfarin tablets on Mondays, Wednesdays and Fridays. All other days, Sundays, Tuesdays, Thursdays, and Saturdays--take one-and-one-half (1 & 1/2) tablets.  Patient verbalized understanding of these instructions.

## 2023-04-19 NOTE — Progress Notes (Signed)
Anticoagulation Management Pamela [name] is a 77 y.o. female who reports to the clinic for monitoring of warfarin treatment.    Indication:  Secondary hyperecoagulable state; History of VTE, Long term current use of oral anticoagulant, warfarin. Target INR range 2.0 - 3.0.   Duration:  Indefinite. Supervising physician:  Charissa Bash, MD  Anticoagulation Clinic Visit History: Patient does not report signs/symptoms of bleeding or thromboembolism  Other recent changes: No diet, medications, lifestyle changes cited by the patient at this visit.  Anticoagulation Episode Summary     Current INR goal:  2.0-3.0  TTR:  60.3% (12.3 y)  Next INR check:  05/17/2023  INR from last check:  2.4 (04/19/2023)  Weekly max warfarin dose:  --  Target end date:  Indefinite  INR check location:  Anticoagulation Clinic  Preferred lab:  --  Send INR reminders to:  [facility]   Indications   Secondary hypercoagulable state (HCC); hx of recurrent DVTs [D68.69] DVT HX OF (Resolved) [Z86.718] Long term (current) use of anticoagulants [Z79.01]        Comments:  --        Anticoagulation Care Providers     Provider Role Specialty Phone number   Zoila Shutter, MD  Internal Medicine [phone]       Allergies  Allergen Reactions   Ace Inhibitors Cough    Current Outpatient Medications:    CALCIUM PO, Take by mouth., Disp: , Rfl:    Cholecalciferol (VITAMIN D-3) 25 MCG (1000 UT) CAPS, Take 1 capsule by mouth daily., Disp: , Rfl:    denosumab (PROLIA) 60 MG/ML SOSY injection, Inject 60 mg into the skin every 6 (six) months., Disp: 1 mL, Rfl: 3   levothyroxine (SYNTHROID) 88 MCG tablet, Take 1 tablet (88 mcg total) by mouth daily before breakfast., Disp: 90 tablet, Rfl: 3   Magnesium 250 MG TABS, Take 1 tablet by mouth daily., Disp: , Rfl:    Multiple Vitamins-Minerals (ALGAE BASED CALCIUM) TABS, Take 500 mg by mouth daily., Disp: , Rfl:    pantoprazole (PROTONIX) 20 MG tablet,  Take 1 tablet (20 mg total) by mouth daily., Disp: 90 tablet, Rfl: 3   warfarin (COUMADIN) 2 MG tablet, Take one (1) of your 2 mg strength, lavender colored warfarin tablets on Mondays, Wednesdays and Fridays. All OTHER DAYS, (Sundays, Tuesdays, Thursdays and Saturdays), take one-and-one-half (1 & 1/2) tablets., Disp: 36 tablet, Rfl: 3 Past Medical History:  Diagnosis Date   Age-related nuclear cataract, bilateral 07/14/2018   Anemia    Anemia requiring transfusions 06/03/2006   Required post operative transfusion 01/2020. Mixed anemia with hx of macrocytic B12 deficiency anemia.    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 Eulah Pont with [facility] is following.   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    Employer: [employer]    Comment: Retired in 2011  Tobacco Use   Smoking status: Former    Current packs/day: 0.00    Average packs/day: 1 pack/day for 20.0 years (20.0 ttl pk-yrs)    Types: Cigarettes    Start date: 07/06/1965    Quit date: 07/06/1985    Years since quitting: 37.8   Smokeless tobacco: Never   Tobacco comments:     Quit 1988  Vaping Use   Vaping status: Never Used  Substance and Sexual Activity   Alcohol use: Yes    Comment: 2 glasses of wine daily    Drug use: No   Sexual activity: Not on file  Other Topics Concern   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       Current Social History 10/18/2019        Patient lives alone in a 2 level home where laundry is in the basement. There is one step without handrail up to the entrance patient uses.      Patient's method of transportation is personal car.      The highest level of education was advanced degree; Master's in Education.      The patient currently retired.      Identified important Relationships are My daughter, good friend, Darl Pikes who is starting with dementia. Several women from church and [employer].      Pets : None       Interests / Fun: Read a lot, take walks, [employer] with church groups and [employer] groups.       Current Stressors: "Cancer. And unable to get knee replacement last year 2/2 Covid pandemic and this year 2/2 Cancer. Hair is starting to fall out 2/2 chemo"      Religious / Personal Beliefs: "Episcopalian, Christian. I believe in God and Heaven and the Trinity."       L. Ducatte, BSN, RN-BC       Social Determinants of Health   Financial Resource Strain: Low Risk  (12/16/2022)   Overall Financial Resource Strain (CARDIA)    Difficulty of Paying Living Expenses: Not hard at all  Food Insecurity: No Food Insecurity (12/16/2022)   Hunger Vital Sign    Worried About Running Out of Food in the Last Year: Never true    Ran Out of Food in the Last Year: Never true  Transportation Needs: No Transportation Needs (12/16/2022)   PRAPARE - Administrator, Civil Service (Medical): No    Lack of Transportation (Non-Medical): No  Physical Activity: Insufficiently Active (12/16/2022)   Exercise Vital Sign    Days of Exercise per Week: 2 days     Minutes of Exercise per Session: 30 min  Stress: No Stress Concern Present (12/16/2022)   [employer] of Occupational Health - Occupational Stress Questionnaire    Feeling of Stress : Not at all  Social Connections: Not on file   Family History  Problem Relation Age of Onset   Cancer Mother        H&N, smoker   Liver disease Mother    Cancer Sister  lung, 2011, smoker   Other Other        grandmother had mastectomy in her 62's unknown reason   Macular degeneration Father    Diverticulitis Father    Breast cancer Neg Hx     ASSESSMENT Recent Results: The most recent result is correlated with 18 mg per week: Lab Results  Component Value Date   INR 2.4 04/19/2023   INR 3.1 (A) 03/22/2023   INR 3.4 (A) 02/22/2023    Anticoagulation Dosing: Description   Take one (1) of your 2 mg strength lavender colored warfarin tablets on Mondays, Wednesdays and Fridays. All other days, Sundays, Tuesdays, Thursdays, and Saturdays--take one-and-one-half (1 & 1/2) tablets.      INR today: Therapeutic  PLAN Weekly dose was unchanged.   Patient Instructions  Patient instructed to take medications as defined in the Anti-coagulation Track section of this encounter.  Patient instructed to take today's dose.  Patient instructed to take one (1) of your 2 mg strength lavender colored warfarin tablets on Mondays, Wednesdays and Fridays. All other days, Sundays, Tuesdays, Thursdays, and Saturdays--take one-and-one-half (1 & 1/2) tablets.  Patient verbalized understanding of these instructions.  Patient advised to contact clinic or seek medical attention if signs/symptoms of bleeding or thromboembolism occur.  Patient verbalized understanding by repeating back information and was advised to contact me if further medication-related questions arise. Patient was also provided an information handout.  Follow-up Return in 4 weeks (on 05/17/2023) for Follow up INR.  Elicia Lamp. PharmD,  CPP  15 minutes spent face-to-face with the patient during the encounter. 50% of time spent on education, including signs/sx bleeding and clotting, as well as food and drug interactions with warfarin. 50% of time was spent on fingerprick POC INR sample collection,processing, results determination, and documentation in [email or web address].

## 2023-04-20 NOTE — Progress Notes (Signed)
INTERNAL MEDICINE TEACHING ATTENDING ADDENDUM   I agree with these recommendations regarding anticoagulation management.   Charissa Bash, MD

## 2023-04-21 ENCOUNTER — Encounter: Payer: Self-pay | Admitting: Oncology

## 2023-04-21 NOTE — Telephone Encounter (Signed)
TC

## 2023-04-29 ENCOUNTER — Other Ambulatory Visit (HOSPITAL_COMMUNITY): Payer: Self-pay

## 2023-05-06 ENCOUNTER — Other Ambulatory Visit: Payer: Self-pay | Admitting: Gastroenterology

## 2023-05-07 ENCOUNTER — Encounter (HOSPITAL_COMMUNITY): Payer: Self-pay | Admitting: Gastroenterology

## 2023-05-13 ENCOUNTER — Other Ambulatory Visit: Payer: Self-pay

## 2023-05-13 ENCOUNTER — Ambulatory Visit (HOSPITAL_COMMUNITY)
Admission: RE | Admit: 2023-05-13 | Discharge: 2023-05-13 | Disposition: A | Discharge status: Home / Self Care | Payer: Medicare Other | Attending: Gastroenterology | Admitting: Gastroenterology

## 2023-05-13 ENCOUNTER — Encounter (HOSPITAL_COMMUNITY)
Admission: RE | Disposition: A | Discharge status: Home / Self Care | Payer: Self-pay | Source: Home / Self Care | Attending: Gastroenterology

## 2023-05-13 ENCOUNTER — Ambulatory Visit (HOSPITAL_COMMUNITY): Payer: Medicare Other | Admitting: Anesthesiology

## 2023-05-13 ENCOUNTER — Encounter (HOSPITAL_COMMUNITY): Payer: Self-pay | Admitting: Gastroenterology

## 2023-05-13 ENCOUNTER — Ambulatory Visit (HOSPITAL_BASED_OUTPATIENT_CLINIC_OR_DEPARTMENT_OTHER): Payer: Medicare Other | Admitting: Anesthesiology

## 2023-05-13 ENCOUNTER — Ambulatory Visit (HOSPITAL_COMMUNITY): Payer: Medicare Other

## 2023-05-13 DIAGNOSIS — Z6829 Body mass index (BMI) 29.0-29.9, adult: Secondary | ICD-10-CM | POA: Insufficient documentation

## 2023-05-13 DIAGNOSIS — E039 Hypothyroidism, unspecified: Secondary | ICD-10-CM | POA: Diagnosis not present

## 2023-05-13 DIAGNOSIS — I10 Essential (primary) hypertension: Secondary | ICD-10-CM

## 2023-05-13 DIAGNOSIS — E669 Obesity, unspecified: Secondary | ICD-10-CM | POA: Insufficient documentation

## 2023-05-13 DIAGNOSIS — K8050 Calculus of bile duct without cholangitis or cholecystitis without obstruction: Secondary | ICD-10-CM | POA: Insufficient documentation

## 2023-05-13 DIAGNOSIS — Z86718 Personal history of other venous thrombosis and embolism: Secondary | ICD-10-CM | POA: Diagnosis not present

## 2023-05-13 DIAGNOSIS — Z7901 Long term (current) use of anticoagulants: Secondary | ICD-10-CM | POA: Insufficient documentation

## 2023-05-13 DIAGNOSIS — Z87891 Personal history of nicotine dependence: Secondary | ICD-10-CM | POA: Insufficient documentation

## 2023-05-13 DIAGNOSIS — K219 Gastro-esophageal reflux disease without esophagitis: Secondary | ICD-10-CM | POA: Diagnosis not present

## 2023-05-13 HISTORY — PX: ERCP: SHX5425

## 2023-05-13 HISTORY — PX: REMOVAL OF STONES: SHX5545

## 2023-05-13 HISTORY — PX: BILIARY DILATION: SHX6850

## 2023-05-13 HISTORY — PX: SPHINCTEROTOMY: SHX5279

## 2023-05-13 LAB — PROTIME-INR
INR: 1.4 — ABNORMAL HIGH (ref 0.8–1.2)
Prothrombin Time: 17.6 s — ABNORMAL HIGH (ref 11.4–15.2)

## 2023-05-13 SURGERY — ERCP, WITH INTERVENTION IF INDICATED
Anesthesia: General | Laterality: Bilateral

## 2023-05-13 MED ORDER — CIPROFLOXACIN IN D5W 400 MG/200ML IV SOLN
INTRAVENOUS | Status: DC | PRN
Start: 2023-05-13 — End: 2023-05-13
  Administered 2023-05-13: 400 mg via INTRAVENOUS

## 2023-05-13 MED ORDER — DEXAMETHASONE SODIUM PHOSPHATE 10 MG/ML IJ SOLN
INTRAMUSCULAR | Status: DC | PRN
  Administered 2023-05-13: 10 mg via INTRAVENOUS

## 2023-05-13 MED ORDER — DICLOFENAC SUPPOSITORY 100 MG
RECTAL | Status: AC
  Filled 2023-05-13: qty 1

## 2023-05-13 MED ORDER — GLUCAGON HCL RDNA (DIAGNOSTIC) 1 MG IJ SOLR
INTRAMUSCULAR | Status: AC
  Filled 2023-05-13: qty 1

## 2023-05-13 MED ORDER — SODIUM CHLORIDE 0.9 % IV SOLN: INTRAVENOUS | Status: DC

## 2023-05-13 MED ORDER — SODIUM CHLORIDE 0.9 % IV SOLN
INTRAVENOUS | Status: DC | PRN
  Administered 2023-05-13: 30 mL

## 2023-05-13 MED ORDER — FENTANYL CITRATE (PF) 250 MCG/5ML IJ SOLN
INTRAMUSCULAR | Status: DC | PRN
  Administered 2023-05-13 (×2): 50 ug via INTRAVENOUS

## 2023-05-13 MED ORDER — CIPROFLOXACIN IN D5W 400 MG/200ML IV SOLN
INTRAVENOUS | Status: AC
  Filled 2023-05-13: qty 200

## 2023-05-13 MED ORDER — PROPOFOL 10 MG/ML IV BOLUS
INTRAVENOUS | Status: AC
  Filled 2023-05-13: qty 20

## 2023-05-13 MED ORDER — ONDANSETRON HCL 4 MG/2ML IJ SOLN
INTRAMUSCULAR | Status: DC | PRN
  Administered 2023-05-13: 4 mg via INTRAVENOUS

## 2023-05-13 MED ORDER — SUGAMMADEX SODIUM 200 MG/2ML IV SOLN
INTRAVENOUS | Status: DC | PRN
  Administered 2023-05-13: 200 mg via INTRAVENOUS

## 2023-05-13 MED ORDER — SODIUM CHLORIDE 0.9 % IV SOLN: INTRAVENOUS | Status: DC | PRN

## 2023-05-13 MED ORDER — PROPOFOL 10 MG/ML IV BOLUS
INTRAVENOUS | Status: DC | PRN
  Administered 2023-05-13: 100 mg via INTRAVENOUS
  Administered 2023-05-13: 50 mg via INTRAVENOUS

## 2023-05-13 MED ORDER — FENTANYL CITRATE (PF) 100 MCG/2ML IJ SOLN
INTRAMUSCULAR | Status: AC
  Filled 2023-05-13: qty 2

## 2023-05-13 MED ORDER — DICLOFENAC SUPPOSITORY 100 MG
RECTAL | Status: DC | PRN
  Administered 2023-05-13: 100 mg via RECTAL

## 2023-05-13 MED ORDER — ROCURONIUM BROMIDE 10 MG/ML (PF) SYRINGE
PREFILLED_SYRINGE | INTRAVENOUS | Status: DC | PRN
  Administered 2023-05-13: 20 mg via INTRAVENOUS
  Administered 2023-05-13: 50 mg via INTRAVENOUS

## 2023-05-13 MED ORDER — LIDOCAINE 2% (20 MG/ML) 5 ML SYRINGE
INTRAMUSCULAR | Status: DC | PRN
  Administered 2023-05-13: 60 mg via INTRAVENOUS

## 2023-05-13 NOTE — Progress Notes (Signed)
Pamela [name] 10:16 AM  Subjective: Patient doing well without any problems since she was recently seen in the office we rediscussed her procedure and she stopped her Coumadin on Sunday has no other complaints  Objective: Signs stable afebrile no acute distress exam please see preassessment evaluation CT at the urologic center reviewed and INR pending  Assessment: Current CBD stones  Plan: Get a proceed with ERCP with anesthesia assistance  [facility]  office [phone] After 5PM or if no answer call [phone]

## 2023-05-13 NOTE — Anesthesia Postprocedure Evaluation (Signed)
Anesthesia Post Note  Patient: Tomasa Hose  Procedure(s) Performed: ENDOSCOPIC RETROGRADE CHOLANGIOPANCREATOGRAPHY (ERCP) (Bilateral) SPHINCTEROTOMY BALLOON DILATION REMOVAL OF STONES     Patient location during evaluation: PACU Anesthesia Type: General Level of consciousness: awake and alert, oriented and patient cooperative Pain management: pain level controlled Vital Signs Assessment: post-procedure vital signs reviewed and stable Respiratory status: spontaneous breathing, nonlabored ventilation and respiratory function stable Cardiovascular status: blood pressure returned to baseline and stable Postop Assessment: no apparent nausea or vomiting Anesthetic complications: no   No notable events documented.  Last Vitals:  Vitals:   05/13/23 1211 05/13/23 1221  BP: (!) 112/40 (!) 145/59  Pulse: 88 81  Resp: 14 19  Temp: 36.7 C   SpO2: 100% 99%    Last Pain:  Vitals:   05/13/23 1221  TempSrc:   PainSc: 0-No pain                 Lannie Fields

## 2023-05-13 NOTE — Anesthesia Procedure Notes (Signed)
Procedure Name: Intubation Date/Time: 05/13/2023 10:58 AM  Performed by: Elyn Peers, CRNAPre-anesthesia Checklist: Patient identified, Emergency Drugs available, Suction available, Patient being monitored and Timeout performed Patient Re-evaluated:Patient Re-evaluated prior to induction Oxygen Delivery Method: Circle system utilized Preoxygenation: Pre-oxygenation with 100% oxygen Induction Type: IV induction Ventilation: Mask ventilation without difficulty Laryngoscope Size: 3 and Mac Grade View: Grade II Tube type: Oral Tube size: 7.0 mm Number of attempts: 1 Airway Equipment and Method: Stylet Placement Confirmation: ETT inserted through vocal cords under direct vision, positive ETCO2 and breath sounds checked- equal and bilateral Secured at: 21 cm Tube secured with: Tape Dental Injury: Teeth and Oropharynx as per pre-operative assessment

## 2023-05-13 NOTE — Op Note (Signed)
[facility] Patient Name: Pamela [name] Procedure Date: 05/13/2023 MRN: [id number] Attending MD: Vida Rigger , MD, [id number] Date of Birth: 1945-08-27 CSN: [id number] Age: 77 Admit Type: Outpatient Procedure:                ERCP Indications:              Bile duct stone(s) Providers:                Vida Rigger, MD, Suzy Bouchard, RN, Janae Sauce. Steele Berg,                            RN, [employer], Technician, Lynnell Jude. Freida Busman                            CRNA, CRNA, Judeth Cornfield [place], CRNA Referring MD:              Medicines:                General Anesthesia Complications:            No immediate complications. Estimated Blood Loss:     Estimated blood loss was minimal. Procedure:                Pre-Anesthesia Assessment:                           - Prior to the procedure, a History and Physical                            was performed, and patient medications and                            allergies were reviewed. The patient's tolerance of                            previous anesthesia was also reviewed. The risks                            and benefits of the procedure and the sedation                            options and risks were discussed with the patient.                            All questions were answered, and informed consent                            was obtained. Prior Anticoagulants: The patient has                            taken Coumadin (warfarin), last dose was 4 days                            prior to procedure. ASA Grade Assessment: III - A  patient with severe systemic disease. After                            reviewing the risks and benefits, the patient was                            deemed in satisfactory condition to undergo the                            procedure.                           After obtaining informed consent, the scope was                            passed under direct vision. Throughout the                             procedure, the patient's blood pressure, pulse, and                            oxygen saturations were monitored continuously. The                            [id number]-[id number] ([id number] Olympus duodenoscope was                            introduced through the mouth, and used to inject                            contrast into and used to cannulate the bile duct.                            The ERCP was somewhat difficult due to abnormal                            anatomy. Successful completion of the procedure was                            aided by performing the maneuvers documented                            (below) in this report. The patient tolerated the                            procedure well. Scope In: Scope Out: Findings:      The major papilla was located entirely within a diverticulum. The major       papilla was slightly congested. On the first attempt possibly the wire       was going towards the pancreas so the sphincterotome was repositioned       and deep selective cannulation was obtained and obvious stones were seen       on first cholangiogram and there was no pancreatic duct injection       throughout  the procedure and we proceeded with a biliary sphincterotomy       as much as possible based on the positioning and the diverticuli and was       made with a Hydratome sphincterotome using ERBE electrocautery. There       was self limited oozing from the sphincterotomy which did not require       treatment. To discover objects, the biliary tree was initially swept       with an adjustable 12 -15 mm balloon starting at the bifurcation. Sludge       was swept from the duct. Most stones were removed. A few stones       remained. Throughout the procedure the 15 mm balloon would not pull       through the patent sphincterotomy site but the 12 most times passed       readily through and we went ahead and proceeded with a dilation of the       common bile duct with  a 12-13.5-15 mm balloon (to a maximum balloon size       of 15 mm) dilator which was successful. We used all 3 size balloons and       when done debris and small stone fragments passed readily through the       patent sphincterotomy site and to discover objects, the biliary tree was       swept with an 15- 18 mm balloon starting at the bifurcation. Sludge was       swept from the duct. Many stones were removed. A few stones remained. We       again had to lower the 15 mm balloon a little to get it through the       sphincterotomy site and to discover objects, the biliary tree was swept       a few more time with an adjustable 12- 15 mm balloon starting at the       bifurcation. Sludge was swept from the duct. All stones were removed.       Nothing was found on multiple subsequent occlusion cholangiograms and       the wire and balloon catheter were removed and the scope was removed and       the patient tolerated the procedure well. Impression:               - The major papilla was located entirely within a                            diverticulum.                           - The major papilla appeared mildly congested.                           - Choledocholithiasis was found. Partial removal                            was accomplished by biliary sphincterotomy;.                           - A biliary sphincterotomy was performed.                           -  The biliary tree was swept multiple times as                            above with 2 size adjustable balloons 12-18.                           - Common bile duct was successfully dilated to 15                            mm.                           -                           - The biliary tree was swept and nothing was found                            at the end of the procedure. Moderate Sedation:      Not Applicable - Patient had care per Anesthesia. Recommendation:           - Clear liquid diet for 6 hours. And if doing well                             at 6 PM may have soft solids                           - Continue present medications.                           - Resume Coumadin (warfarin) at prior dose in 3                            days.                           - Telephone GI clinic if symptomatic PRN.                           - Return to GI clinic PRN. Procedure Code(s):        --- Professional ---                           [phone], 59, Endoscopic retrograde                            cholangiopancreatography (ERCP); with                            trans-endoscopic balloon dilation of                            biliary/pancreatic duct(s) or of ampulla                            (sphincteroplasty), including sphincterotomy, when  performed, each duct                           [phone], Endoscopic retrograde                            cholangiopancreatography (ERCP); with removal of                            calculi/debris from biliary/pancreatic duct(s) Diagnosis Code(s):        --- Professional ---                           K80.50, Calculus of bile duct without cholangitis                            or cholecystitis without obstruction                           K83.8, Other specified diseases of biliary tract CPT copyright 2022 American Medical Association. All rights reserved. The codes documented in this report are preliminary and upon coder review may  be revised to meet current compliance requirements. Vida Rigger, MD 05/13/2023 12:16:31 PM This report has been signed electronically. Number of Addenda: 0

## 2023-05-13 NOTE — Anesthesia Preprocedure Evaluation (Addendum)
Anesthesia Evaluation  Patient identified by MRN, date of birth, ID band Patient awake    Reviewed: Allergy & Precautions, H&P , NPO status , Patient's Chart, lab work & pertinent test results  History of Anesthesia Complications (+) PONV and history of anesthetic complications  Airway Mallampati: III  TM Distance: >3 FB Neck ROM: Full    Dental  (+) Teeth Intact, Dental Advisory Given   Pulmonary former smoker   Pulmonary exam normal breath sounds clear to auscultation       Cardiovascular hypertension (158-160 SBP preop, per pt normally slightly lower- has been off BP meds for about a year with weight loss), Normal cardiovascular exam Rhythm:Regular Rate:Normal     Neuro/Psych negative neurological ROS  negative psych ROS   GI/Hepatic Neg liver ROS,GERD  Controlled,,  Endo/Other  Hypothyroidism    Renal/GU negative Renal ROS  negative genitourinary   Musculoskeletal  (+) Arthritis , Osteoarthritis,    Abdominal  (+) + obese  Peds negative pediatric ROS (+)  Hematology negative hematology ROS (+)   Anesthesia Other Findings   Reproductive/Obstetrics negative OB ROS                             Anesthesia Physical Anesthesia Plan  ASA: 3  Anesthesia Plan: General   Post-op Pain Management:    Induction: Intravenous  PONV Risk Score and Plan: Ondansetron, Dexamethasone and Treatment may vary due to age or medical condition  Airway Management Planned: Oral ETT  Additional Equipment: None  Intra-op Plan:   Post-operative Plan: Extubation in OR  Informed Consent: I have reviewed the patients History and Physical, chart, labs and discussed the procedure including the risks, benefits and alternatives for the proposed anesthesia with the patient or authorized representative who has indicated his/her understanding and acceptance.     Dental advisory given  Plan Discussed with:  CRNA  Anesthesia Plan Comments: (Last airway note Ventilation: Mask ventilation without difficulty Laryngoscope Size: Miller and 3 Grade View: Grade II Tube type: Oral Tube size: 7.0 mm Number of attempts: 1 )        Anesthesia Quick Evaluation

## 2023-05-13 NOTE — Discharge Instructions (Addendum)
YOU HAD AN ENDOSCOPIC PROCEDURE TODAY: Refer to the procedure report and other information in the discharge instructions given to you for any specific questions about what was found during the examination. If this information does not answer your questions, please call [facility] office at [phone] to clarify.   YOU SHOULD EXPECT: Some feelings of bloating in the abdomen. Passage of more gas than usual. Walking can help get rid of the air that was put into your GI tract during the procedure and reduce the bloating. If you had a lower endoscopy (such as a colonoscopy or flexible sigmoidoscopy) you may notice spotting of blood in your stool or on the toilet paper. Some abdominal soreness may be present for a day or two, also.  DIET: Your first meal following the procedure should be a light meal and then it is ok to progress to your normal diet. A half-sandwich or bowl of soup is an example of a good first meal. Heavy or fried foods are harder to digest and may make you feel nauseous or bloated. Drink plenty of fluids but you should avoid alcoholic beverages for 24 hours. If you had a esophageal dilation, please see attached instructions for diet.    ACTIVITY: Your care partner should take you home directly after the procedure. You should plan to take it easy, moving slowly for the rest of the day. You can resume normal activity the day after the procedure however YOU SHOULD NOT DRIVE, use power tools, machinery or perform tasks that involve climbing or major physical exertion for 24 hours (because of the sedation medicines used during the test).   SYMPTOMS TO REPORT IMMEDIATELY: A gastroenterologist can be reached at any hour. Please call [phone]  for any of the following symptoms:  Following lower endoscopy (colonoscopy, flexible sigmoidoscopy) Excessive amounts of blood in the stool  Significant tenderness, worsening of abdominal pains  Swelling of the abdomen that is new, acute  Fever of 100  or higher  Following upper endoscopy (EGD, EUS, ERCP, esophageal dilation) Vomiting of blood or coffee ground material  New, significant abdominal pain  New, significant chest pain or pain under the shoulder blades  Painful or persistently difficult swallowing  New shortness of breath  Black, tarry-looking or red, bloody stools  FOLLOW UP:  If any biopsies were taken you will be contacted by phone or by letter within the next 1-3 weeks. Call [phone]  if you have not heard about the biopsies in 3 weeks.  Please also call with any specific questions about appointments or follow up tests. Call if GI question or problem otherwise clear liquids only today until 6 PM and if doing well this evening may have soft solids or slowly advance tomorrow and if doing okay without signs of bleeding or complication resume Coumadin on Sunday and follow-up in the office as needed

## 2023-05-13 NOTE — Transfer of Care (Signed)
Immediate Anesthesia Transfer of Care Note  Patient: Pamela [name]  Procedure(s) Performed: ENDOSCOPIC RETROGRADE CHOLANGIOPANCREATOGRAPHY (ERCP) (Bilateral) SPHINCTEROTOMY BALLOON DILATION  Patient Location: PACU and Endoscopy Unit  Anesthesia Type:General  Level of Consciousness: awake, alert , and oriented  Airway & Oxygen Therapy: Patient Spontanous Breathing and Patient connected to face mask oxygen  Post-op Assessment: Report given to RN and Post -op Vital signs reviewed and stable  Post vital signs: Reviewed and stable  Last Vitals:  Vitals Value Taken Time  BP 112/40 05/13/23 1210  Temp    Pulse 85 05/13/23 1212  Resp 16 05/13/23 1212  SpO2 100 % 05/13/23 1212  Vitals shown include unfiled device data.  Last Pain:  Vitals:   05/13/23 0938  TempSrc: Temporal  PainSc: 0-No pain         Complications: No notable events documented.

## 2023-05-16 ENCOUNTER — Encounter (HOSPITAL_COMMUNITY): Payer: Self-pay | Admitting: Gastroenterology

## 2023-05-17 ENCOUNTER — Ambulatory Visit: Payer: Medicare Other

## 2023-05-24 ENCOUNTER — Ambulatory Visit: Payer: Medicare Other | Admitting: Pharmacist

## 2023-05-24 DIAGNOSIS — Z7901 Long term (current) use of anticoagulants: Secondary | ICD-10-CM | POA: Diagnosis not present

## 2023-05-24 DIAGNOSIS — D6869 Other thrombophilia: Secondary | ICD-10-CM

## 2023-05-24 LAB — POCT INR: INR: 1.9 — AB (ref 2.0–3.0)

## 2023-05-24 NOTE — Progress Notes (Signed)
Anticoagulation Management Pamela [name] is a 77 y.o. female who reports to the clinic for monitoring of warfarin treatment.    Indication:  Secondary hypercoagulable state with history of VTE; Long term current use of oral anticoagulation with warfarin for target INR range 2.0 - 3.0.    Duration: indefinite Supervising physician: Carlynn Purl  Anticoagulation Clinic Visit History: Patient does not report signs/symptoms of bleeding or thromboembolism  Other recent changes: No diet, medications, lifestyle except as noted in patient findings.  Anticoagulation Episode Summary     Current INR goal:  2.0-3.0  TTR:  60.0% (12.4 y)  Next INR check:  06/21/2023  INR from last check:  1.9 (05/24/2023)  Weekly max warfarin dose:  --  Target end date:  Indefinite  INR check location:  Anticoagulation Clinic  Preferred lab:  --  Send INR reminders to:  [facility]   Indications   Secondary hypercoagulable state (HCC); hx of recurrent DVTs [D68.69] DVT HX OF (Resolved) [Z86.718] Long term (current) use of anticoagulants [Z79.01]        Comments:  --        Anticoagulation Care Providers     Provider Role Specialty Phone number   Zoila Shutter, MD  Internal Medicine [phone]       Allergies  Allergen Reactions   Ace Inhibitors Cough    Current Outpatient Medications:    CALCIUM PO, Take by mouth., Disp: , Rfl:    Cholecalciferol (VITAMIN D-3) 25 MCG (1000 UT) CAPS, Take 1 capsule by mouth daily., Disp: , Rfl:    denosumab (PROLIA) 60 MG/ML SOSY injection, Inject 60 mg into the skin every 6 (six) months., Disp: 1 mL, Rfl: 3   levothyroxine (SYNTHROID) 88 MCG tablet, Take 1 tablet (88 mcg total) by mouth daily before breakfast., Disp: 90 tablet, Rfl: 3   Magnesium 250 MG TABS, Take 1 tablet by mouth daily., Disp: , Rfl:    Multiple Vitamins-Minerals (ALGAE BASED CALCIUM) TABS, Take 500 mg by mouth daily., Disp: , Rfl:    pantoprazole (PROTONIX) 20 MG tablet,  Take 1 tablet (20 mg total) by mouth daily., Disp: 90 tablet, Rfl: 3   warfarin (COUMADIN) 2 MG tablet, Take one (1) of your 2 mg strength, lavender colored warfarin tablets daily on Mondays, Wednesdays and Fridays. All OTHER DAYS, (Sundays, Tuesdays, Thursdays and Saturdays), take one-and-one-half (1 & 1/2) tablets daily., Disp: 36 tablet, Rfl: 3 Past Medical History:  Diagnosis Date   Age-related nuclear cataract, bilateral 07/14/2018   Anemia    Anemia requiring transfusions 06/03/2006   Required post operative transfusion 01/2020. Mixed anemia with hx of macrocytic B12 deficiency anemia.    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 Eulah Pont with [facility] is following.   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    Employer: [employer]    Comment: Retired in 2011  Tobacco Use   Smoking status: Former    Current packs/day: 0.00    Average packs/day: 1 pack/day for 20.0 years (20.0 ttl pk-yrs)    Types: Cigarettes    Start date: 07/06/1965    Quit date: 07/06/1985    Years since quitting: 37.9   Smokeless tobacco: Never    Tobacco comments:    Quit 1988  Vaping Use   Vaping status: Never Used  Substance and Sexual Activity   Alcohol use: Yes    Comment: 2 glasses of wine daily    Drug use: No   Sexual activity: Not on file  Other Topics Concern   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       Current Social History 10/18/2019        Patient lives alone in a 2 level home where laundry is in the basement. There is one step without handrail up to the entrance patient uses.      Patient's method of transportation is personal car.      The highest level of education was advanced degree; Master's in Education.      The patient currently retired.      Identified important Relationships are My daughter, good friend, Darl Pikes who is starting with dementia. Several women from church and [employer].      Pets : None       Interests / Fun: Read a lot, take walks, [employer] with church groups and [employer] groups.       Current Stressors: "Cancer. And unable to get knee replacement last year 2/2 Covid pandemic and this year 2/2 Cancer. Hair is starting to fall out 2/2 chemo"      Religious / Personal Beliefs: "Episcopalian, Christian. I believe in God and Heaven and the Trinity."       L. Ducatte, BSN, RN-BC       Social Determinants of Health   Financial Resource Strain: Low Risk  (12/16/2022)   Overall Financial Resource Strain (CARDIA)    Difficulty of Paying Living Expenses: Not hard at all  Food Insecurity: No Food Insecurity (12/16/2022)   Hunger Vital Sign    Worried About Running Out of Food in the Last Year: Never true    Ran Out of Food in the Last Year: Never true  Transportation Needs: No Transportation Needs (12/16/2022)   PRAPARE - Administrator, Civil Service (Medical): No    Lack of Transportation (Non-Medical): No  Physical Activity: Insufficiently Active (12/16/2022)   Exercise Vital Sign    Days of Exercise  per Week: 2 days    Minutes of Exercise per Session: 30 min  Stress: No Stress Concern Present (12/16/2022)   [employer] of Occupational Health - Occupational Stress Questionnaire    Feeling of Stress : Not at all  Social Connections: Not on file   Family History  Problem Relation Age of Onset   Cancer Mother        H&N, smoker   Liver disease Mother    Cancer Sister  lung, 2011, smoker   Other Other        grandmother had mastectomy in her 18's unknown reason   Macular degeneration Father    Diverticulitis Father    Breast cancer Neg Hx     ASSESSMENT Recent Results: The most recent result is correlated with 18 mg per week: Lab Results  Component Value Date   INR 1.9 (A) 05/24/2023   INR 1.4 (H) 05/13/2023   INR 2.4 04/19/2023    Anticoagulation Dosing: Description   Take one (1) of your 2 mg strength lavender colored warfarin tablets on Mondays, Wednesdays and Fridays. All other days, Sundays, Tuesdays, Thursdays, and Saturdays--take one-and-one-half (1 & 1/2) tablets.      INR today: Subtherapeutic  PLAN Weekly dose was unchanged. Continue with 18 mg warfarin per week; 2 mg on M/W/F; 3 mg on Sun/Tue/Thurs/Sat. Repeat INR on 16-DEC-24.   Patient Instructions  Patient instructed to take medications as defined in the Anti-coagulation Track section of this encounter.  Patient instructed to take today's dose.  Patient instructed to take  one (1) of your 2 mg strength lavender colored warfarin tablets on Mondays, Wednesdays and Fridays. All other days, Sundays, Tuesdays, Thursdays, and Saturdays--take one-and-one-half (1 & 1/2) tablets.  Patient verbalized understanding of these instructions.  Patient advised to contact clinic or seek medical attention if signs/symptoms of bleeding or thromboembolism occur.  Patient verbalized understanding by repeating back information and was advised to contact me if further medication-related questions arise. Patient was  also provided an information handout.  Follow-up Return in 4 weeks (on 06/21/2023) for Follow up INR.  Elicia Lamp, PharmD, CPP  15 minutes spent face-to-face with the patient during the encounter. 50% of time spent on education, including signs/sx bleeding and clotting, as well as food and drug interactions with warfarin. 50% of time was spent on fingerprick POC INR sample collection,processing, results determination, and documentation in [email or web address].

## 2023-05-24 NOTE — Patient Instructions (Signed)
Patient instructed to take medications as defined in the Anti-coagulation Track section of this encounter.  Patient instructed to take today's dose.  Patient instructed to take  one (1) of your 2 mg strength lavender colored warfarin tablets on Mondays, Wednesdays and Fridays. All other days, Sundays, Tuesdays, Thursdays, and Saturdays--take one-and-one-half (1 & 1/2) tablets.  Patient verbalized understanding of these instructions.

## 2023-05-29 ENCOUNTER — Other Ambulatory Visit: Payer: Self-pay

## 2023-05-31 ENCOUNTER — Other Ambulatory Visit (HOSPITAL_COMMUNITY): Payer: Self-pay

## 2023-05-31 ENCOUNTER — Other Ambulatory Visit: Payer: Self-pay

## 2023-05-31 MED ORDER — PANTOPRAZOLE SODIUM 20 MG PO TBEC
20.0000 mg | DELAYED_RELEASE_TABLET | Freq: Every day | ORAL | 3 refills | Status: DC
  Filled 2023-05-31: qty 90, 90d supply, fill #0
  Filled 2023-08-19: qty 90, 90d supply, fill #1
  Filled 2023-11-29: qty 90, 90d supply, fill #2
  Filled 2024-02-21: qty 90, 90d supply, fill #3

## 2023-05-31 NOTE — Telephone Encounter (Signed)
Medication sent to pharmacy  

## 2023-06-21 ENCOUNTER — Ambulatory Visit (INDEPENDENT_AMBULATORY_CARE_PROVIDER_SITE_OTHER): Payer: Medicare Other | Admitting: Pharmacist

## 2023-06-21 DIAGNOSIS — D6869 Other thrombophilia: Secondary | ICD-10-CM

## 2023-06-21 DIAGNOSIS — Z7901 Long term (current) use of anticoagulants: Secondary | ICD-10-CM | POA: Diagnosis not present

## 2023-06-21 LAB — POCT INR: INR: 2.6 (ref 2.0–3.0)

## 2023-06-21 NOTE — Patient Instructions (Signed)
Patient instructed to take medications as defined in the Anti-coagulation Track section of this encounter.  Patient instructed to take today's dose.  Patient instructed to take  one (1) of your 2 mg strength lavender colored warfarin tablets on Mondays, Wednesdays and Fridays. All other days, Sundays, Tuesdays, Thursdays, and Saturdays--take one-and-one-half (1 & 1/2) tablets.  Patient verbalized understanding of these instructions.

## 2023-06-21 NOTE — Progress Notes (Signed)
Anticoagulation Management Pamela [name] is a 78 y.o. female who reports to the clinic for monitoring of warfarin treatment.    Indication:  Secondary hypercoagulable state; History of recurrence of deep-venous thrombosis; Long term current use of oral anticoagulation with warfarin to maintain INR range 2.0 - 3.0.   \ indefinite Supervising physician:  Mercie Eon, MD  Anticoagulation Clinic Visit History: Patient does not report signs/symptoms of bleeding or thromboembolism  Other recent changes: No diet, medications, lifestyle changes endorsed by the patient at this visit to me.  Anticoagulation Episode Summary     Current INR goal:  2.0-3.0  TTR:  60.2% (12.5 y)  Next INR check:  07/19/2023  INR from last check:  2.6 (06/21/2023)  Weekly max warfarin dose:  --  Target end date:  Indefinite  INR check location:  Anticoagulation Clinic  Preferred lab:  --  Send INR reminders to:  [facility]   Indications   Secondary hypercoagulable state (HCC); hx of recurrent DVTs [D68.69] DVT HX OF (Resolved) [Z86.718] Long term (current) use of anticoagulants [Z79.01]        Comments:  --        Anticoagulation Care Providers     Provider Role Specialty Phone number   Zoila Shutter, MD  Internal Medicine [phone]       Allergies  Allergen Reactions   Ace Inhibitors Cough    Current Outpatient Medications:    CALCIUM PO, Take by mouth., Disp: , Rfl:    Cholecalciferol (VITAMIN D-3) 25 MCG (1000 UT) CAPS, Take 1 capsule by mouth daily., Disp: , Rfl:    denosumab (PROLIA) 60 MG/ML SOSY injection, Inject 60 mg into the skin every 6 (six) months., Disp: 1 mL, Rfl: 3   levothyroxine (SYNTHROID) 88 MCG tablet, Take 1 tablet (88 mcg total) by mouth daily before breakfast., Disp: 90 tablet, Rfl: 3   Magnesium 250 MG TABS, Take 1 tablet by mouth daily., Disp: , Rfl:    Multiple Vitamins-Minerals (ALGAE BASED CALCIUM) TABS, Take 500 mg by mouth daily., Disp: , Rfl:     pantoprazole (PROTONIX) 20 MG tablet, Take 1 tablet (20 mg total) by mouth daily., Disp: 90 tablet, Rfl: 3   warfarin (COUMADIN) 2 MG tablet, Take one (1) of your 2 mg strength, lavender colored warfarin tablets daily on Mondays, Wednesdays and Fridays. All OTHER DAYS, (Sundays, Tuesdays, Thursdays and Saturdays), take one-and-one-half (1 & 1/2) tablets daily., Disp: 36 tablet, Rfl: 3 Past Medical History:  Diagnosis Date   Age-related nuclear cataract, bilateral 07/14/2018   Anemia    Anemia requiring transfusions 06/03/2006   Required post operative transfusion 01/2020. Mixed anemia with hx of macrocytic B12 deficiency anemia.    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 Eulah Pont with [facility] is following.   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    Employer: [employer]    Comment: Retired in 2011  Tobacco Use   Smoking status: Former    Current packs/day: 0.00    Average packs/day: 1 pack/day for 20.0 years (20.0 ttl pk-yrs)    Types: Cigarettes    Start date: 07/06/1965    Quit date: 07/06/1985    Years since quitting: 37.9    Smokeless tobacco: Never   Tobacco comments:    Quit 1988  Vaping Use   Vaping status: Never Used  Substance and Sexual Activity   Alcohol use: Yes    Comment: 2 glasses of wine daily    Drug use: No   Sexual activity: Not on file  Other Topics Concern   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       Current Social History 10/18/2019        Patient lives alone in a 2 level home where laundry is in the basement. There is one step without handrail up to the entrance patient uses.      Patient's method of transportation is personal car.      The highest level of education was advanced degree; Master's in Education.      The patient currently retired.      Identified important Relationships are My daughter, good friend, Darl Pikes who is starting with dementia. Several women from church and [employer].      Pets : None       Interests / Fun: Read a lot, take walks, Yahoo with church groups and [employer] groups.       Current Stressors: "Cancer. And unable to get knee replacement last year 2/2 Covid pandemic and this year 2/2 Cancer. Hair is starting to fall out 2/2 chemo"      Religious / Personal Beliefs: "Episcopalian, Christian. I believe in God and Heaven and the Trinity."       L. Ducatte, BSN, RN-BC       Social Drivers of Health   Financial Resource Strain: Low Risk  (12/16/2022)   Overall Financial Resource Strain (CARDIA)    Difficulty of Paying Living Expenses: Not hard at all  Food Insecurity: No Food Insecurity (12/16/2022)   Hunger Vital Sign    Worried About Running Out of Food in the Last Year: Never true    Ran Out of Food in the Last Year: Never true  Transportation Needs: No Transportation Needs (12/16/2022)   PRAPARE - Administrator, Civil Service (Medical): No    Lack of Transportation (Non-Medical): No  Physical Activity: Insufficiently Active (12/16/2022)   Exercise Vital  Sign    Days of Exercise per Week: 2 days    Minutes of Exercise per Session: 30 min  Stress: No Stress Concern Present (12/16/2022)   [employer] of Occupational Health - Occupational Stress Questionnaire    Feeling of Stress : Not at all  Social Connections: Not on file   Family History  Problem Relation Age of Onset   Cancer Mother        H&N, smoker   Liver  disease Mother    Cancer Sister        lung, 2011, smoker   Other Other        grandmother had mastectomy in her 56's unknown reason   Macular degeneration Father    Diverticulitis Father    Breast cancer Neg Hx     ASSESSMENT Recent Results: The most recent result is correlated with 18 mg per week: Lab Results  Component Value Date   INR 2.6 06/21/2023   INR 1.9 (A) 05/24/2023   INR 1.4 (H) 05/13/2023    Anticoagulation Dosing: Description   Take one (1) of your 2 mg strength lavender colored warfarin tablets on Mondays, Wednesdays and Fridays. All other days, Sundays, Tuesdays, Thursdays, and Saturdays--take one-and-one-half (1 & 1/2) tablets.      INR today: Therapeutic  PLAN Weekly dose was unchanged. Maintain 18 mg total weekly dosage.   Patient Instructions  Patient instructed to take medications as defined in the Anti-coagulation Track section of this encounter.  Patient instructed to take today's dose.  Patient instructed to take one (1) of your 2 mg strength lavender colored warfarin tablets on Mondays, Wednesdays and Fridays. All other days, Sundays, Tuesdays, Thursdays, and Saturdays--take one-and-one-half (1 & 1/2) tablets.  Patient verbalized understanding of these instructions.  Patient advised to contact clinic or seek medical attention if signs/symptoms of bleeding or thromboembolism occur.  Patient verbalized understanding by repeating back information and was advised to contact me if further medication-related questions arise. Patient was also provided an information  handout.  Follow-up Return in 4 weeks (on 07/19/2023) for Follow up INR.  Elicia Lamp, PharmD, CPP  15 minutes spent face-to-face with the patient during the encounter. 50% of time spent on education, including signs/sx bleeding and clotting, as well as food and drug interactions with warfarin. 50% of time was spent on fingerprick POC INR sample collection,processing, results determination, and documentation in [email or web address].

## 2023-07-08 ENCOUNTER — Other Ambulatory Visit (HOSPITAL_COMMUNITY): Payer: Self-pay | Admitting: Internal Medicine

## 2023-07-08 DIAGNOSIS — Z1231 Encounter for screening mammogram for malignant neoplasm of breast: Secondary | ICD-10-CM

## 2023-07-19 ENCOUNTER — Ambulatory Visit (INDEPENDENT_AMBULATORY_CARE_PROVIDER_SITE_OTHER): Payer: Medicare Other | Admitting: Pharmacist

## 2023-07-19 DIAGNOSIS — Z7901 Long term (current) use of anticoagulants: Secondary | ICD-10-CM

## 2023-07-19 DIAGNOSIS — D6869 Other thrombophilia: Secondary | ICD-10-CM | POA: Diagnosis not present

## 2023-07-19 LAB — POCT INR: INR: 2 (ref 2.0–3.0)

## 2023-07-19 NOTE — Progress Notes (Signed)
 Anticoagulation Management Pamela [name] is a 78 y.o. female who reports to the clinic for monitoring of warfarin treatment.    Indication:  secondary hypercoagulable state, history of DVT; long term current use of oral anticoagulation with warfarin with target INR range 2.0 - 3.0.   Duration: indefinite Supervising physician:  Ronnald Sergeant, MD  Anticoagulation Clinic Visit History: Patient does not report signs/symptoms of bleeding or thromboembolism  Other recent changes: No diet, medications, lifestyle changes reported by the patient.  Anticoagulation Episode Summary     Current INR goal:  2.0-3.0  TTR:  60.4% (12.6 y)  Next INR check:  08/09/2023  INR from last check:  2.0 (07/19/2023)  Weekly max warfarin dose:  --  Target end date:  Indefinite  INR check location:  Anticoagulation Clinic  Preferred lab:  --  Send INR reminders to:  [facility]   Indications   Secondary hypercoagulable state (HCC); hx of recurrent DVTs [D68.69] DVT HX OF (Resolved) [Z86.718] Long term (current) use of anticoagulants [Z79.01]        Comments:  --        Anticoagulation Care Providers     Provider Role Specialty Phone number   Otho Darnelle BRAVO, MD  Internal Medicine [phone]       Allergies  Allergen Reactions   Ace Inhibitors Cough    Current Outpatient Medications:    CALCIUM PO, Take by mouth., Disp: , Rfl:    Cholecalciferol (VITAMIN D -3) 25 MCG (1000 UT) CAPS, Take 1 capsule by mouth daily., Disp: , Rfl:    denosumab  (PROLIA ) 60 MG/ML SOSY injection, Inject 60 mg into the skin every 6 (six) months., Disp: 1 mL, Rfl: 3   levothyroxine  (SYNTHROID ) 88 MCG tablet, Take 1 tablet (88 mcg total) by mouth daily before breakfast., Disp: 90 tablet, Rfl: 3   Magnesium  250 MG TABS, Take 1 tablet by mouth daily., Disp: , Rfl:    Multiple Vitamins-Minerals (ALGAE BASED CALCIUM) TABS, Take 500 mg by mouth daily., Disp: , Rfl:    pantoprazole  (PROTONIX ) 20 MG tablet, Take 1  tablet (20 mg total) by mouth daily., Disp: 90 tablet, Rfl: 3   warfarin (COUMADIN ) 2 MG tablet, Take one (1) of your 2 mg strength, lavender colored warfarin tablets daily on Mondays, Wednesdays and Fridays. All OTHER DAYS, (Sundays, Tuesdays, Thursdays and Saturdays), take one-and-one-half (1 & 1/2) tablets daily., Disp: 36 tablet, Rfl: 3 Past Medical History:  Diagnosis Date   Age-related nuclear cataract, bilateral 07/14/2018   Anemia    Anemia requiring transfusions 06/03/2006   Required post operative transfusion 01/2020. Mixed anemia with hx of macrocytic B12 deficiency anemia.    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 [name] is following.   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    Employer: [employer]    Comment: Retired in 2011  Tobacco Use   Smoking status: Former    Current packs/day: 0.00    Average packs/day: 1 pack/day for 20.0 years (20.0 ttl pk-yrs)    Types: Cigarettes    Start date: 07/06/1965    Quit date: 07/06/1985    Years since quitting: 38.0   Smokeless tobacco: Never   Tobacco  comments:    Quit 1988  Vaping Use   Vaping status: Never Used  Substance and Sexual Activity   Alcohol use: Yes    Comment: 2 glasses of wine daily    Drug use: No   Sexual activity: Not on file  Other Topics Concern   Not on file  Social History Narrative   Worked as designer, 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       Current Social History 10/18/2019        Patient lives alone in a 2 level home where laundry is in the basement. There is one step without handrail up to the entrance patient uses.      Patient's method of transportation is personal car.      The highest level of education was advanced degree; Master's in Education.      The patient currently retired.      Identified important Relationships are My daughter, good friend, Devere who is starting with dementia. Several women from church and [employer].      Pets : None       Interests / Fun: Read a lot, take walks, [employer] with church groups and [employer] groups.       Current Stressors: Cancer. And unable to get knee replacement last year 2/2 Covid pandemic and this year 2/2 Cancer. Hair is starting to fall out 2/2 chemo      Religious / Personal Beliefs: Episcopalian, Christian. I believe in God and Heaven and the [place].       [name], BSN, RN-BC       Social Drivers of Health   Financial Resource Strain: Low Risk  (12/16/2022)   Overall Financial Resource Strain (CARDIA)    Difficulty of Paying Living Expenses: Not hard at all  Food Insecurity: No Food Insecurity (12/16/2022)   Hunger Vital Sign    Worried About Running Out of Food in the Last Year: Never true    Ran Out of Food in the Last Year: Never true  Transportation Needs: No Transportation Needs (12/16/2022)   PRAPARE - Administrator, Civil Service (Medical): No    Lack of Transportation (Non-Medical): No  Physical Activity: Insufficiently Active (12/16/2022)   Exercise Vital Sign    Days of Exercise per Week: 2  days    Minutes of Exercise per Session: 30 min  Stress: No Stress Concern Present (12/16/2022)   [employer] of Occupational Health - Occupational Stress Questionnaire    Feeling of Stress : Not at all  Social Connections: Not on file   Family History  Problem Relation Age of Onset   Cancer Mother        H&N, smoker   Liver disease Mother    Cancer Sister  lung, 2011, smoker   Other Other        grandmother had mastectomy in her 11's unknown reason   Macular degeneration Father    Diverticulitis Father    Breast cancer Neg Hx     ASSESSMENT Recent Results: The most recent result is correlated with 18 mg per week: Lab Results  Component Value Date   INR 2.0 07/19/2023   INR 2.6 06/21/2023   INR 1.9 (A) 05/24/2023    Anticoagulation Dosing: Description   Take one-and-one-half (1 & 1/2) tablets of your 2 mg lavender colored warfarin tablets by mouth, once-daily--EXCEPT on WEDNESDAYS, take ONLY ONE (1) tablet every Wednesday.      INR today: Therapeutic  PLAN Weekly dose was increased by 11% to 20 mg per week  Patient Instructions  Patient instructed to take medications as defined in the Anti-coagulation Track section of this encounter.  Patient instructed to take today's dose.  Patient instructed to take one-and-one-half (1 & 1/2) tablets of your 2 mg lavender colored warfarin tablets by mouth, once-daily--EXCEPT on [facility], take ONLY ONE (1) tablet every Wednesday.  Patient verbalized understanding of these instructions.  Patient advised to contact clinic or seek medical attention if signs/symptoms of bleeding or thromboembolism occur.  Patient verbalized understanding by repeating back information and was advised to contact me if further medication-related questions arise. Patient was also provided an information handout.  Follow-up Return in about 3 weeks (around 08/09/2023) for Follow up INR.  Lynwood KATHEE Lites, PharmD, CPP  15 minutes spent  face-to-face with the patient during the encounter. 50% of time spent on education, including signs/sx bleeding and clotting, as well as food and drug interactions with warfarin. 50% of time was spent on fingerprick POC INR sample collection,processing, results determination, and documentation in [email or web address].

## 2023-07-19 NOTE — Patient Instructions (Signed)
 Patient instructed to take medications as defined in the Anti-coagulation Track section of this encounter.  Patient instructed to take today's dose.  Patient instructed to take one-and-one-half (1 & 1/2) tablets of your 2 mg lavender colored warfarin tablets by mouth, once-daily--EXCEPT on [facility], take ONLY ONE (1) tablet every Wednesday.  Patient verbalized understanding of these instructions.

## 2023-07-19 NOTE — Progress Notes (Signed)
 Evaluation and management procedures were performed by the Clinical Pharmacy Practitioner under my supervision and collaboration. I have reviewed the Practitioner's note and chart, and I agree with the management and plan as documented above.   Dickie La, MD

## 2023-07-29 ENCOUNTER — Other Ambulatory Visit: Payer: Self-pay

## 2023-08-02 ENCOUNTER — Other Ambulatory Visit: Payer: Self-pay

## 2023-08-02 ENCOUNTER — Ambulatory Visit: Payer: Medicare Other

## 2023-08-02 ENCOUNTER — Other Ambulatory Visit (HOSPITAL_COMMUNITY): Payer: Self-pay

## 2023-08-02 NOTE — Progress Notes (Signed)
Specialty Pharmacy Refill Coordination Note  Pamela [name] is a 78 y.o. female contacted today regarding refills of specialty medication(s) No medications found.   Patient requested Courier to Provider Office   Delivery date: 08/05/23   Verified address: [facility], [address] [place], [place], [address]   Medication will be filled on 08/04/23.

## 2023-08-04 ENCOUNTER — Other Ambulatory Visit: Payer: Self-pay

## 2023-08-04 ENCOUNTER — Other Ambulatory Visit (HOSPITAL_COMMUNITY): Payer: Self-pay

## 2023-08-05 ENCOUNTER — Telehealth: Payer: Self-pay | Admitting: *Deleted

## 2023-08-05 NOTE — Telephone Encounter (Addendum)
We received pt's Prolia via courier . Pt was called to schedule an appt - no answer; left message on pt's vm to call the office to schedule the appt. Medication will be placed in our refrigerator.

## 2023-08-06 ENCOUNTER — Encounter (HOSPITAL_COMMUNITY): Payer: Self-pay

## 2023-08-06 ENCOUNTER — Ambulatory Visit (HOSPITAL_COMMUNITY)
Admission: RE | Admit: 2023-08-06 | Discharge: 2023-08-06 | Disposition: A | Discharge status: Home / Self Care | Payer: Medicare Other | Source: Ambulatory Visit | Attending: Internal Medicine | Admitting: Internal Medicine

## 2023-08-06 DIAGNOSIS — Z1231 Encounter for screening mammogram for malignant neoplasm of breast: Secondary | ICD-10-CM | POA: Diagnosis present

## 2023-08-09 ENCOUNTER — Ambulatory Visit: Payer: Medicare Other | Admitting: *Deleted

## 2023-08-09 ENCOUNTER — Ambulatory Visit (INDEPENDENT_AMBULATORY_CARE_PROVIDER_SITE_OTHER): Payer: Medicare Other | Admitting: Pharmacist

## 2023-08-09 ENCOUNTER — Other Ambulatory Visit (HOSPITAL_COMMUNITY): Payer: Self-pay

## 2023-08-09 DIAGNOSIS — Z7901 Long term (current) use of anticoagulants: Secondary | ICD-10-CM

## 2023-08-09 DIAGNOSIS — M810 Age-related osteoporosis without current pathological fracture: Secondary | ICD-10-CM

## 2023-08-09 DIAGNOSIS — D6869 Other thrombophilia: Secondary | ICD-10-CM | POA: Diagnosis not present

## 2023-08-09 LAB — POCT INR: INR: 2.2 (ref 2.0–3.0)

## 2023-08-09 MED ORDER — DENOSUMAB 60 MG/ML ~~LOC~~ SOSY
60.0000 mg | PREFILLED_SYRINGE | Freq: Once | SUBCUTANEOUS | Status: AC
  Administered 2023-08-09: 60 mg via SUBCUTANEOUS

## 2023-08-09 NOTE — Progress Notes (Signed)
Anticoagulation Management Pamela [name] is a 78 y.o. female who reports to the clinic for monitoring of warfarin treatment.    Indication:  Secondary hypercoagulable state, long term current use of an oral anticoagulant, warfarin.    Duration: indefinite Supervising physician:  Dr. Dickie La  Anticoagulation Clinic Visit History: Patient does not report signs/symptoms of bleeding or thromboembolism  Other recent changes: No diet, medications, lifestyle changes cited by the patient at this visit.  Anticoagulation Episode Summary     Current INR goal:  2.0-3.0  TTR:  60.6% (12.6 y)  Next INR check:  08/30/2023  INR from last check:  2.2 (08/09/2023)  Weekly max warfarin dose:  --  Target end date:  Indefinite  INR check location:  Anticoagulation Clinic  Preferred lab:  --  Send INR reminders to:  [facility]   Indications   Secondary hypercoagulable state (HCC); hx of recurrent DVTs [D68.69] DVT HX OF (Resolved) [Z86.718] Long term (current) use of anticoagulants [Z79.01]        Comments:  --        Anticoagulation Care Providers     Provider Role Specialty Phone number   Zoila Shutter, MD  Internal Medicine [phone]       Allergies  Allergen Reactions   Ace Inhibitors Cough    Current Outpatient Medications:    CALCIUM PO, Take by mouth., Disp: , Rfl:    Cholecalciferol (VITAMIN D-3) 25 MCG (1000 UT) CAPS, Take 1 capsule by mouth daily., Disp: , Rfl:    denosumab (PROLIA) 60 MG/ML SOSY injection, Inject 60 mg into the skin every 6 (six) months., Disp: 1 mL, Rfl: 3   levothyroxine (SYNTHROID) 88 MCG tablet, Take 1 tablet (88 mcg total) by mouth daily before breakfast., Disp: 90 tablet, Rfl: 3   Magnesium 250 MG TABS, Take 1 tablet by mouth daily., Disp: , Rfl:    Multiple Vitamins-Minerals (ALGAE BASED CALCIUM) TABS, Take 500 mg by mouth daily., Disp: , Rfl:    pantoprazole (PROTONIX) 20 MG tablet, Take 1 tablet (20 mg total) by mouth daily., Disp:  90 tablet, Rfl: 3   warfarin (COUMADIN) 2 MG tablet, Take one (1) of your 2 mg strength, lavender colored warfarin tablets daily on Mondays, Wednesdays and Fridays. All OTHER DAYS, (Sundays, Tuesdays, Thursdays and Saturdays), take one-and-one-half (1 & 1/2) tablets daily., Disp: 36 tablet, Rfl: 3 Past Medical History:  Diagnosis Date   Age-related nuclear cataract, bilateral 07/14/2018   Anemia    Anemia requiring transfusions 06/03/2006   Required post operative transfusion 01/2020. Mixed anemia with hx of macrocytic B12 deficiency anemia.    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 Eulah Pont with [facility] is following.   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    Employer: [employer]    Comment: Retired in 2011  Tobacco Use   Smoking status: Former    Current packs/day: 0.00    Average packs/day: 1 pack/day for 20.0 years (20.0 ttl pk-yrs)    Types: Cigarettes    Start date: 07/06/1965    Quit date: 07/06/1985    Years since quitting: 38.1   Smokeless tobacco: Never   Tobacco comments:    Quit 1988  Vaping Use   Vaping  status: Never Used  Substance and Sexual Activity   Alcohol use: Yes    Comment: 2 glasses of wine daily    Drug use: No   Sexual activity: Not on file  Other Topics Concern   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       Current Social History 10/18/2019        Patient lives alone in a 2 level home where laundry is in the basement. There is one step without handrail up to the entrance patient uses.      Patient's method of transportation is personal car.      The highest level of education was advanced degree; Master's in Education.      The patient currently retired.      Identified important Relationships are My daughter, good friend, Darl Pikes who is starting with dementia. Several women from church and [employer].      Pets : None       Interests / Fun: Read a lot, take walks, Yahoo with church groups and [employer] groups.       Current Stressors: "Cancer. And unable to get knee replacement last year 2/2 Covid pandemic and this year 2/2 Cancer. Hair is starting to fall out 2/2 chemo"      Religious / Personal Beliefs: "Episcopalian, Christian. I believe in God and Heaven and the Trinity."       L. Ducatte, BSN, RN-BC       Social Drivers of Health   Financial Resource Strain: Low Risk  (12/16/2022)   Overall Financial Resource Strain (CARDIA)    Difficulty of Paying Living Expenses: Not hard at all  Food Insecurity: No Food Insecurity (12/16/2022)   Hunger Vital Sign    Worried About Running Out of Food in the Last Year: Never true    Ran Out of Food in the Last Year: Never true  Transportation Needs: No Transportation Needs (12/16/2022)   PRAPARE - Administrator, Civil Service (Medical): No    Lack of Transportation (Non-Medical): No  Physical Activity: Insufficiently Active (12/16/2022)   Exercise Vital Sign    Days of Exercise per Week: 2 days    Minutes of Exercise per Session: 30 min   Stress: No Stress Concern Present (12/16/2022)   [employer] of Occupational Health - Occupational Stress Questionnaire    Feeling of Stress : Not at all  Social Connections: Not on file   Family History  Problem Relation Age of Onset   Cancer Mother        H&N, smoker   Liver disease Mother    Cancer Sister  lung, 2011, smoker   Other Other        grandmother had mastectomy in her 64's unknown reason   Macular degeneration Father    Diverticulitis Father    Breast cancer Neg Hx     ASSESSMENT Recent Results: The most recent result is correlated with 20 mg per week: Lab Results  Component Value Date   INR 2.2 08/09/2023   INR 2.0 07/19/2023   INR 2.6 06/21/2023    Anticoagulation Dosing: Description   Take one-and-one-half (1 & 1/2) tablets of your 2 mg lavender colored warfarin tablets by mouth, once-daily, every day.      INR today: Therapeutic  PLAN Weekly dose was increased by 5% to 21 mg per week  Patient Instructions  Patient instructed to take medications as defined in the Anti-coagulation Track section of this encounter.  Patient instructed to take today's dose.  Patient instructed to take one and one-half (1 & 1/2) of your 2 mg strength, lavender colored warfarin tablets by mouth, once daily. Patient verbalized understanding of these instructions.  Patient advised to contact clinic or seek medical attention if signs/symptoms of bleeding or thromboembolism occur.  Patient verbalized understanding by repeating back information and was advised to contact me if further medication-related questions arise. Patient was also provided an information handout.  Follow-up Return in 3 weeks (on 08/30/2023) for Follow up INR.  Elicia Lamp, PharmD, CPP  15 minutes spent face-to-face with the patient during the encounter. 50% of time spent on education, including signs/sx bleeding and clotting, as well as food and drug interactions with warfarin. 50% of  time was spent on fingerprick POC INR sample collection,processing, results determination, and documentation in [email or web address].

## 2023-08-09 NOTE — Patient Instructions (Signed)
Patient instructed to take medications as defined in the Anti-coagulation Track section of this encounter.  Patient instructed to take today's dose.  Patient instructed to take one and one-half (1 & 1/2) of your 2 mg strength, lavender colored warfarin tablets by mouth, once daily. Patient verbalized understanding of these instructions.

## 2023-08-16 ENCOUNTER — Ambulatory Visit: Payer: Medicare Other

## 2023-08-19 ENCOUNTER — Other Ambulatory Visit (HOSPITAL_COMMUNITY): Payer: Self-pay

## 2023-08-19 ENCOUNTER — Other Ambulatory Visit: Payer: Self-pay | Admitting: Pharmacist

## 2023-08-19 MED ORDER — WARFARIN SODIUM 2 MG PO TABS
3.0000 mg | ORAL_TABLET | Freq: Every day | ORAL | 3 refills | Status: DC
  Filled 2023-08-19: qty 44, 29d supply, fill #0
  Filled 2023-09-12: qty 44, 29d supply, fill #1
  Filled 2023-10-17: qty 44, 29d supply, fill #2
  Filled 2023-11-16: qty 44, 29d supply, fill #3

## 2023-08-19 NOTE — Telephone Encounter (Signed)
Requested via secured chat/email a refill for the patient's warfarin prescription. Current instructions are: Take one-and-one-half (1 & 1/2) of your 2 mg strength warfarin tablets by mouth, once daily. Will require 44 tablets per month to achieve. Will send electronically to her pharmacy.

## 2023-08-20 NOTE — Telephone Encounter (Signed)
Agree thank you

## 2023-08-30 ENCOUNTER — Ambulatory Visit (INDEPENDENT_AMBULATORY_CARE_PROVIDER_SITE_OTHER): Payer: Medicare Other | Admitting: Pharmacist

## 2023-08-30 DIAGNOSIS — Z86718 Personal history of other venous thrombosis and embolism: Secondary | ICD-10-CM

## 2023-08-30 DIAGNOSIS — D6869 Other thrombophilia: Secondary | ICD-10-CM

## 2023-08-30 DIAGNOSIS — Z7901 Long term (current) use of anticoagulants: Secondary | ICD-10-CM | POA: Diagnosis not present

## 2023-08-30 LAB — POCT INR: INR: 2.4 (ref 2.0–3.0)

## 2023-08-30 NOTE — Progress Notes (Signed)
 INTERNAL MEDICINE TEACHING ATTENDING ADDENDUM   I agree with these recommendations regarding anticoagulation management.   Charissa Bash, MD

## 2023-08-30 NOTE — Progress Notes (Signed)
 Anticoagulation Management Pamela [name] is a 78 y.o. female who reports to the clinic for monitoring of warfarin treatment.    Indication: DVT, History of; Hypercoagulable state, long term current use of oral anticoagulant with warfarin with target range INR 2.0 - 3.0.  Duration: indefinite Supervising physician:  Zenaida Deed. Mayford Knife, MD  Anticoagulation Clinic Visit History: Patient does not report signs/symptoms of bleeding or thromboembolism  Other recent changes: No diet, medications, lifestyle changes identified.  Anticoagulation Episode Summary     Current INR goal:  2.0-3.0  TTR:  60.8% (12.7 y)  Next INR check:  09/27/2023  INR from last check:  2.4 (08/30/2023)  Weekly max warfarin dose:  --  Target end date:  Indefinite  INR check location:  Anticoagulation Clinic  Preferred lab:  --  Send INR reminders to:  [facility]   Indications   Secondary hypercoagulable state (HCC); hx of recurrent DVTs [D68.69] DVT HX OF (Resolved) [Z86.718] Long term (current) use of anticoagulants [Z79.01]        Comments:  --        Anticoagulation Care Providers     Provider Role Specialty Phone number   Zoila Shutter, MD  Internal Medicine [phone]       Allergies  Allergen Reactions   Ace Inhibitors Cough    Current Outpatient Medications:    CALCIUM PO, Take by mouth., Disp: , Rfl:    Cholecalciferol (VITAMIN D-3) 25 MCG (1000 UT) CAPS, Take 1 capsule by mouth daily., Disp: , Rfl:    denosumab (PROLIA) 60 MG/ML SOSY injection, Inject 60 mg into the skin every 6 (six) months., Disp: 1 mL, Rfl: 3   levothyroxine (SYNTHROID) 88 MCG tablet, Take 1 tablet (88 mcg total) by mouth daily before breakfast., Disp: 90 tablet, Rfl: 3   Magnesium 250 MG TABS, Take 1 tablet by mouth daily., Disp: , Rfl:    Multiple Vitamins-Minerals (ALGAE BASED CALCIUM) TABS, Take 500 mg by mouth daily., Disp: , Rfl:    pantoprazole (PROTONIX) 20 MG tablet, Take 1 tablet (20 mg total)  by mouth daily., Disp: 90 tablet, Rfl: 3   warfarin (COUMADIN) 2 MG tablet, Take 1.5 tablets (3 mg total) by mouth daily at 4 PM., Disp: 44 tablet, Rfl: 3 Past Medical History:  Diagnosis Date   Age-related nuclear cataract, bilateral 07/14/2018   Anemia    Anemia requiring transfusions 06/03/2006   Required post operative transfusion 01/2020. Mixed anemia with hx of macrocytic B12 deficiency anemia.    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 Eulah Pont with [facility] is following.   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    Employer: [employer]    Comment: Retired in 2011  Tobacco Use   Smoking status: Former    Current packs/day: 0.00    Average packs/day: 1 pack/day for 20.0 years (20.0 ttl pk-yrs)    Types: Cigarettes    Start date: 07/06/1965    Quit date: 07/06/1985    Years since quitting: 38.1   Smokeless tobacco: Never   Tobacco comments:    Quit 1988  Vaping Use   Vaping status: Never Used  Substance and Sexual Activity   Alcohol use: Yes    Comment: 2 glasses of wine daily    Drug use: No   Sexual activity:  Not on file  Other Topics Concern   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       Current Social History 10/18/2019        Patient lives alone in a 2 level home where laundry is in the basement. There is one step without handrail up to the entrance patient uses.      Patient's method of transportation is personal car.      The highest level of education was advanced degree; Master's in Education.      The patient currently retired.      Identified important Relationships are My daughter, good friend, Darl Pikes who is starting with dementia. Several women from church and [employer].      Pets : None       Interests / Fun: Read a lot, take walks, Yahoo with church groups and [employer] groups.       Current Stressors: "Cancer. And unable to get knee replacement last year 2/2 Covid pandemic and this year 2/2 Cancer. Hair is starting to fall out 2/2 chemo"      Religious / Personal Beliefs: "Episcopalian, Christian. I believe in God and Heaven and the Trinity."       L. Ducatte, BSN, RN-BC       Social Drivers of Health   Financial Resource Strain: Low Risk  (12/16/2022)   Overall Financial Resource Strain (CARDIA)    Difficulty of Paying Living Expenses: Not hard at all  Food Insecurity: No Food Insecurity (12/16/2022)   Hunger Vital Sign    Worried About Running Out of Food in the Last Year: Never true    Ran Out of Food in the Last Year: Never true  Transportation Needs: No Transportation Needs (12/16/2022)   PRAPARE - Administrator, Civil Service (Medical): No    Lack of Transportation (Non-Medical): No  Physical Activity: Insufficiently Active (12/16/2022)   Exercise Vital Sign    Days of Exercise per Week: 2 days    Minutes of Exercise per Session: 30 min  Stress: No Stress Concern Present (12/16/2022)   [employer] of Occupational Health - Occupational Stress Questionnaire    Feeling of  Stress : Not at all  Social Connections: Not on file   Family History  Problem Relation Age of Onset   Cancer Mother        H&N, smoker   Liver disease Mother    Cancer Sister        lung, 2011, smoker   Other Other        grandmother had mastectomy in her 54's  unknown reason   Macular degeneration Father    Diverticulitis Father    Breast cancer Neg Hx     ASSESSMENT Recent Results: The most recent result is correlated with 21 mg per week: Lab Results  Component Value Date   INR 2.4 08/30/2023   INR 2.2 08/09/2023   INR 2.0 07/19/2023    Anticoagulation Dosing: Description   Take one-and-one-half (1 & 1/2) tablets of your 2 mg lavender colored warfarin tablets by mouth, once-daily, every day.      INR today: Therapeutic  PLAN Weekly dose was unchanged. Continue to take one-and-one-half (1 & 1/2) of your 2 mg lavender colored warfarin tablets by mouth, once-daily.   Patient Instructions  Patient instructed to take medications as defined in the Anti-coagulation Track section of this encounter.  Patient instructed to take today's dose.  Patient instructed to take one-and-one-half (1 & 1/2) tablets of your 2 mg lavender colored warfarin tablets by mouth, once-daily, every day.  Patient verbalized understanding of these instructions.  Patient advised to contact clinic or seek medical attention if signs/symptoms of bleeding or thromboembolism occur.  Patient verbalized understanding by repeating back information and was advised to contact me if further medication-related questions arise. Patient was also provided an information handout.  Follow-up Return in 4 weeks (on 09/27/2023) for Follow up INR.  Elicia Lamp, PharmD, CPP  15 minutes spent face-to-face with the patient during the encounter. 50% of time spent on education, including signs/sx bleeding and clotting, as well as food and drug interactions with warfarin. 50% of time was spent on fingerprick POC INR sample  collection,processing, results determination, and documentation in [email or web address].

## 2023-08-30 NOTE — Patient Instructions (Signed)
 Patient instructed to take medications as defined in the Anti-coagulation Track section of this encounter.  Patient instructed to take today's dose.  Patient instructed to take one-and-one-half (1 & 1/2) tablets of your 2 mg lavender colored warfarin tablets by mouth, once-daily, every day.  Patient verbalized understanding of these instructions.

## 2023-09-23 ENCOUNTER — Ambulatory Visit: Payer: Medicare Other | Admitting: Internal Medicine

## 2023-09-23 ENCOUNTER — Encounter: Payer: Self-pay | Admitting: Internal Medicine

## 2023-09-23 VITALS — BP 137/77 | HR 71 | Temp 98.1°F | Ht 64.0 in | Wt 177.0 lb

## 2023-09-23 DIAGNOSIS — H903 Sensorineural hearing loss, bilateral: Secondary | ICD-10-CM | POA: Diagnosis not present

## 2023-09-23 DIAGNOSIS — K219 Gastro-esophageal reflux disease without esophagitis: Secondary | ICD-10-CM | POA: Diagnosis not present

## 2023-09-23 DIAGNOSIS — M810 Age-related osteoporosis without current pathological fracture: Secondary | ICD-10-CM

## 2023-09-23 DIAGNOSIS — I129 Hypertensive chronic kidney disease with stage 1 through stage 4 chronic kidney disease, or unspecified chronic kidney disease: Secondary | ICD-10-CM

## 2023-09-23 DIAGNOSIS — K9089 Other intestinal malabsorption: Secondary | ICD-10-CM

## 2023-09-23 DIAGNOSIS — N183 Chronic kidney disease, stage 3 unspecified: Secondary | ICD-10-CM

## 2023-09-23 DIAGNOSIS — I10 Essential (primary) hypertension: Secondary | ICD-10-CM

## 2023-09-23 DIAGNOSIS — C689 Malignant neoplasm of urinary organ, unspecified: Secondary | ICD-10-CM

## 2023-09-23 DIAGNOSIS — E039 Hypothyroidism, unspecified: Secondary | ICD-10-CM

## 2023-09-23 NOTE — Assessment & Plan Note (Signed)
 Has done well on chronic stable dosing of levothyroxine, checked annually.  Will be due for TSH around the same time as her next Prolia injection - this can be a combined nurse and lab visit.

## 2023-09-23 NOTE — Patient Instructions (Addendum)
 Great to see you as always, Almyra Free!  I'm glad you're doing well and hope you're able to find some exercise programs through [facility] to maximize your mobility and strength.  Next time we see each other will be in our new office space!  Take care and stay well,  Dr. Raynelle Fanning

## 2023-09-23 NOTE — Assessment & Plan Note (Signed)
 Recent surveillance imaging and f/u with oncology reassuring.  Continue to monitor per oncology recs. Doing well with her urostomy.

## 2023-09-23 NOTE — Assessment & Plan Note (Signed)
 EGFR very steady over the years in mid 58s, last checked 03/2023.  On no medicines which should influence or be influenced by renal function.  She is well-hydrated.  Recheck BMP with other labs late summer 2025.

## 2023-09-23 NOTE — Assessment & Plan Note (Signed)
 On the PPI, has episodes of nocturnal reflux and cough a couple of times a month.  Has never had reflux pain.  Ok to continue PPI.

## 2023-09-23 NOTE — Progress Notes (Signed)
 78 y.o. Pamela [name] is here for routine follow-up of osteoporosis, hypothyroidism, HTN, and CKD among other problems listed below. Since her last visit with me in 12/2022, she has continued anticoagulation visits with Pharm.D. Dr. Alexandria Lodge for recurrent DVTs, had a urothelial cancer surveillance visit with oncology in 03/2023 which was reassuring with regard to the bladder cancer but which showed dilated bile duct and stones (s/p cholestectomy) further evaluated with a ERCP and sphincterotomy by Dr. Ewing Schlein in 05/2023 (was always asymptomatic), and has continued with her semiannual Prolia injections through [facility]-most recently 08/2023.  No complaints, doing well.  We discussed exercise, and she was disappointed to learn that [employer] doesn't cover Silver Sneakers, though there is something similar; she will investigate.  She will also think about PT, which could help with both her L kinee and with her back pain. Has had R knee replacement; L knee feels worse when stepping down, pain is felt anteriorly.   Patient Active Problem List   Diagnosis Date Noted   Hx of urothelial cancer (HCC), treated 08/11/2019   Secondary hypercoagulable state (HCC); hx of recurrent DVTs 06/03/2006   CKD (chronic kidney disease) stage 3, GFR 30-59 ml/min (HCC) 01/05/2023   Hearing loss 07/23/2022   Retinal pigmentation 07/23/2022   Long term (current) use of anticoagulants 01/26/2022   Overweight (BMI 25.0-29.9) 03/20/2021   Factor II deficiency (HCC)    Port-A-Cath in place 04/30/2020   History of non anemic vitamin B12 deficiency 03/01/2018   History of gout 03/04/2015   GERD (gastroesophageal reflux disease) with nocturnal cough 03/03/2011   Osteoporosis, post-menopausal 04/24/2008   Essential hypertension, benign 04/24/2008   Hypothyroidism 04/21/2006    Current Outpatient Medications:    CALCIUM PO, Take by mouth., Disp: , Rfl:    Cholecalciferol (VITAMIN D-3) 25 MCG (1000 UT) CAPS, Take 1 capsule by mouth  daily., Disp: , Rfl:    denosumab (PROLIA) 60 MG/ML SOSY injection, Inject 60 mg into the skin every 6 (six) months., Disp: 1 mL, Rfl: 3   levothyroxine (SYNTHROID) 88 MCG tablet, Take 1 tablet (88 mcg total) by mouth daily before breakfast., Disp: 90 tablet, Rfl: 3   Magnesium 250 MG TABS, Take 1 tablet by mouth daily., Disp: , Rfl:    Multiple Vitamins-Minerals (ALGAE BASED CALCIUM) TABS, Take 500 mg by mouth daily., Disp: , Rfl:    pantoprazole (PROTONIX) 20 MG tablet, Take 1 tablet (20 mg total) by mouth daily., Disp: 90 tablet, Rfl: 3   warfarin (COUMADIN) 2 MG tablet, Take 1.5 tablets (3 mg total) by mouth daily at 4 PM., Disp: 44 tablet, Rfl: 3  Functional Status: Lives alone, fully independent and active in the community.  Enjoys singing in choral groups and traveling.   Is aware of needing to pay attention to balance, but no falls.  Is particularly cautious on steps and curbs.   Objective BP 137/77 (BP Location: Right Arm, Patient Position: Sitting, Cuff Size: Small)   Pulse 71   Temp 98.1 F (36.7 C) (Oral)   Ht 5\' 4"  (1.626 m)   Wt 177 lb (80.3 kg)   SpO2 100%   BMI 30.38 kg/m   Exam: Well and youthful appearing, smooth and balanced gait, heart RRR no murmur, lungs clear.  Problems addressed today:  Osteoporosis, post-menopausal Assessment & Plan: Tx hx:  2023 found compression fracture, referred to Dr. Cleophas Dunker, who then started the Prolia through [facility] orthopedics.  Bile acid malabsorption syndrome / Diarrhea, resolving over the years.  Assessment & Plan: Doing ok w/o the colestid, has a couple loose stools in the morning.  Will resolve problem from problem list.  Sensorineural hearing loss (SNHL) of both ears Assessment & Plan: Doesn't wear HA's consistently, difficult to fit when also wearing glasses.   She is satisfied for the time being.  Gastroesophageal reflux disease without esophagitis Assessment & Plan: On the PPI, has episodes of nocturnal reflux and  cough a couple of times a month.  Has never had reflux pain.  Ok to continue PPI.   Osteoporosis, post-menopausal Tx hx:  2023 found compression fracture, referred to dr. Cleophas Dunker, who then started the Prolia through [facility].  Has not been on other agents.  Vit D level replete.  Continue current Prolia injections Q 89M.  Bile acid malabsorption syndrome / Diarrhea, resolving over the years. Doing ok w/o the colestid, has a couple loose stools in the morning.  Will resolve problem.   Hearing loss Doesn't wear HA's consistently, difficult to fit when also wearing glasses.  She isn't concerned.  GERD (gastroesophageal reflux disease) with nocturnal cough On the PPI, has episodes of nocturnal reflux and cough a couple of times a month.  Has never had reflux pain.  Ok to continue PPI.   Hypothyroidism Has done well on chronic stable dosing of levothyroxine, checked annually.  Will be due for TSH around the same time as her next Prolia injection - this can be a combined nurse and lab visit.   Hx of urothelial cancer (HCC), treated Recent surveillance imaging and f/u with oncology reassuring.  Continue to monitor per oncology recs. Doing well with her urostomy.   Essential hypertension, benign 137/77 on no medication.  Was overly controlled on losartan 25 mg which was discontinued.  Continue to monitor.  CKD (chronic kidney disease) stage 3, GFR 30-59 ml/min (HCC) EGFR very steady over the years in mid 22s, last checked 03/2023.  On no medicines which should influence or be influenced by renal function.  She is well-hydrated.  Recheck BMP with other labs late summer 2025.  Return in about 1 year (around 09/22/2024) for chronic condition monitoring.

## 2023-09-23 NOTE — Assessment & Plan Note (Signed)
 Doesn't wear HA's consistently, difficult to fit when also wearing glasses.  She isn't concerned.

## 2023-09-23 NOTE — Assessment & Plan Note (Signed)
 2023 found compression fracture, referred to dr. Cleophas Dunker, who then started the Prolia through [facility].

## 2023-09-23 NOTE — Assessment & Plan Note (Signed)
 137/77 on no medication.  Was overly controlled on losartan 25 mg which was discontinued.  Continue to monitor.

## 2023-09-23 NOTE — Assessment & Plan Note (Signed)
 Doing ok w/o the colestid, has a couple loose stools in the morning.

## 2023-09-27 ENCOUNTER — Ambulatory Visit (INDEPENDENT_AMBULATORY_CARE_PROVIDER_SITE_OTHER): Payer: Medicare Other | Admitting: Pharmacist

## 2023-09-27 DIAGNOSIS — Z7901 Long term (current) use of anticoagulants: Secondary | ICD-10-CM

## 2023-09-27 DIAGNOSIS — Z86718 Personal history of other venous thrombosis and embolism: Secondary | ICD-10-CM

## 2023-09-27 DIAGNOSIS — D6869 Other thrombophilia: Secondary | ICD-10-CM | POA: Diagnosis not present

## 2023-09-27 LAB — POCT INR: INR: 2.7 (ref 2.0–3.0)

## 2023-09-27 NOTE — Patient Instructions (Signed)
 Patient instructed to take medications as defined in the Anti-coagulation Track section of this encounter.  Patient instructed to take today's dose.  Patient instructed to take one-and-one-half (1 & 1/2) tablets of your 2 mg lavender colored warfarin tablets by mouth, once-daily, every day.  Patient verbalized understanding of these instructions.

## 2023-09-27 NOTE — Progress Notes (Signed)
 INTERNAL MEDICINE TEACHING ATTENDING ADDENDUM   I agree with these recommendations regarding anticoagulation management.   Charissa Bash, MD

## 2023-09-27 NOTE — Progress Notes (Signed)
 Anticoagulation Management Pamela [name] is a 78 y.o. female who reports to the clinic for monitoring of warfarin treatment.    Indication: DVT , History of; Secondary hypercoagulable state, Long term current use of oral anticoagulation with warfarin (patinet preference 2/2 costs of DOAC) to an INR range of 2.0 - 3.0.  Duration: indefinite Supervising physician:  Zenaida Deed. Mayford Knife, MD  Anticoagulation Clinic Visit History: Patient does not report signs/symptoms of bleeding or thromboembolism  Other recent changes: No diet, medications, lifestyle changes cited by the patient at this visit.  Anticoagulation Episode Summary     Current INR goal:  2.0-3.0  TTR:  61.0% (12.8 y)  Next INR check:  10/25/2023  INR from last check:  2.7 (09/27/2023)  Weekly max warfarin dose:  --  Target end date:  Indefinite  INR check location:  Anticoagulation Clinic  Preferred lab:  --  Send INR reminders to:  [facility]   Indications   Secondary hypercoagulable state (HCC); hx of recurrent DVTs [D68.69] DVT HX OF (Resolved) [Z86.718] Long term (current) use of anticoagulants [Z79.01]        Comments:  --        Anticoagulation Care Providers     Provider Role Specialty Phone number   Zoila Shutter, MD  Internal Medicine [phone]       Allergies  Allergen Reactions   Ace Inhibitors Cough    Current Outpatient Medications:    CALCIUM PO, Take by mouth., Disp: , Rfl:    Cholecalciferol (VITAMIN D-3) 25 MCG (1000 UT) CAPS, Take 1 capsule by mouth daily., Disp: , Rfl:    denosumab (PROLIA) 60 MG/ML SOSY injection, Inject 60 mg into the skin every 6 (six) months., Disp: 1 mL, Rfl: 3   levothyroxine (SYNTHROID) 88 MCG tablet, Take 1 tablet (88 mcg total) by mouth daily before breakfast., Disp: 90 tablet, Rfl: 3   Magnesium 250 MG TABS, Take 1 tablet by mouth daily., Disp: , Rfl:    Multiple Vitamins-Minerals (ALGAE BASED CALCIUM) TABS, Take 500 mg by mouth daily., Disp: ,  Rfl:    pantoprazole (PROTONIX) 20 MG tablet, Take 1 tablet (20 mg total) by mouth daily., Disp: 90 tablet, Rfl: 3   warfarin (COUMADIN) 2 MG tablet, Take 1.5 tablets (3 mg total) by mouth daily at 4 PM., Disp: 44 tablet, Rfl: 3 Past Medical History:  Diagnosis Date   Age-related nuclear cataract, bilateral 07/14/2018   Anemia    Anemia requiring transfusions 06/03/2006   Required post operative transfusion 01/2020. Mixed anemia with hx of macrocytic B12 deficiency anemia.    Arthritis    Bile acid malabsorption syndrome / Diarrhea, resolving over the years. 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 Eulah Pont with [facility] is following.   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    Employer: [employer]    Comment: Retired in 2011  Tobacco Use   Smoking status: Former    Current packs/day: 0.00    Average packs/day: 1 pack/day for 20.0 years (20.0 ttl pk-yrs)    Types: Cigarettes    Start date: 07/06/1965    Quit date: 07/06/1985    Years since quitting: 38.2   Smokeless  tobacco: Never   Tobacco comments:    Quit 1988  Vaping Use   Vaping status: Never Used  Substance and Sexual Activity   Alcohol use: Yes    Comment: 2 glasses of wine daily    Drug use: No   Sexual activity: Not on file  Other Topics Concern   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       Current Social History 10/18/2019        Patient lives alone in a 2 level home where laundry is in the basement. There is one step without handrail up to the entrance patient uses.      Patient's method of transportation is personal car.      The highest level of education was advanced degree; Master's in Education.      The patient currently retired.      Identified important Relationships are My daughter, good friend, Darl Pikes who is starting with dementia. Several women from church and [employer].      Pets : None       Interests / Fun: Read a lot, take walks, [employer] with church groups and [employer] groups.       Current Stressors: "Cancer. And unable to get knee replacement last year 2/2 Covid pandemic and this year 2/2 Cancer. Hair is starting to fall out 2/2 chemo"      Religious / Personal Beliefs: "Episcopalian, Christian. I believe in God and Heaven and the Trinity."       L. Ducatte, BSN, RN-BC       Social Drivers of Health   Financial Resource Strain: Low Risk  (12/16/2022)   Overall Financial Resource Strain (CARDIA)    Difficulty of Paying Living Expenses: Not hard at all  Food Insecurity: No Food Insecurity (12/16/2022)   Hunger Vital Sign    Worried About Running Out of Food in the Last Year: Never true    Ran Out of Food in the Last Year: Never true  Transportation Needs: No Transportation Needs (12/16/2022)   PRAPARE - Administrator, Civil Service (Medical): No    Lack of Transportation (Non-Medical): No  Physical Activity: Insufficiently Active (12/16/2022)   Exercise Vital Sign    Days  of Exercise per Week: 2 days    Minutes of Exercise per Session: 30 min  Stress: No Stress Concern Present (12/16/2022)   [employer] of Occupational Health - Occupational Stress Questionnaire    Feeling of Stress : Not at all  Social Connections: Not on file   Family History  Problem Relation Age of Onset   Cancer Mother  H&N, smoker   Liver disease Mother    Cancer Sister        lung, 2011, smoker   Other Other        grandmother had mastectomy in her 95's unknown reason   Macular degeneration Father    Diverticulitis Father    Breast cancer Neg Hx     ASSESSMENT Recent Results: The most recent result is correlated with 21 mg per week: Lab Results  Component Value Date   INR 2.7 09/27/2023   INR 2.4 08/30/2023   INR 2.2 08/09/2023    Anticoagulation Dosing: Description   Take one-and-one-half (1 & 1/2) tablets of your 2 mg lavender colored warfarin tablets by mouth, once-daily, every day.      INR today: Therapeutic  PLAN Weekly dose was unchanged.  Patient Instructions  Patient instructed to take medications as defined in the Anti-coagulation Track section of this encounter.  Patient instructed to take today's dose.  Patient instructed to take  one-and-one-half (1 & 1/2) tablets of your 2 mg lavender colored warfarin tablets by mouth, once-daily, every day.  Patient verbalized understanding of these instructions.  Patient advised to contact clinic or seek medical attention if signs/symptoms of bleeding or thromboembolism occur.  Patient verbalized understanding by repeating back information and was advised to contact me if further medication-related questions arise. Patient was also provided an information handout.  Follow-up Return in 4 weeks (on 10/25/2023) for Follow up INR.  Elicia Lamp, PharmD, CPP  15 minutes spent face-to-face with the patient during the encounter. 50% of time spent on education, including signs/sx bleeding and clotting, as  well as food and drug interactions with warfarin. 50% of time was spent on fingerprick POC INR sample collection,processing, results determination, and documentation in [email or web address].

## 2023-10-25 ENCOUNTER — Ambulatory Visit (INDEPENDENT_AMBULATORY_CARE_PROVIDER_SITE_OTHER): Admitting: Pharmacist

## 2023-10-25 DIAGNOSIS — D6869 Other thrombophilia: Secondary | ICD-10-CM

## 2023-10-25 DIAGNOSIS — Z7901 Long term (current) use of anticoagulants: Secondary | ICD-10-CM | POA: Diagnosis not present

## 2023-10-25 LAB — POCT INR: INR: 3.3 — AB (ref 2.0–3.0)

## 2023-10-25 NOTE — Patient Instructions (Signed)
 Patient instructed to take medications as defined in the Anti-coagulation Track section of this encounter.  Patient instructed to take today's dose.  Patient instructed to take one-and-one-half (1 & 1/2) tablets of your 2 mg lavender colored warfarin tablets by mouth, once-daily, on Sundays, Mondays, Wednesdays, Thursdays, and Saturdays. On TUESDAYS and FRIDAYS, take ONLY ONE (1) TABLET OF WARFARIN.  Patient verbalized understanding of these instructions.

## 2023-10-25 NOTE — Progress Notes (Signed)
 Anticoagulation Management Pamela [name] is a 78 y.o. female who reports to the clinic for monitoring of warfarin treatment.    Indication:  Secondary hypercoagulable state; History of DVT; Long term current use of oral anticoagulation with warfarin (Patient preference, citing costs) for target INR range 2.0 - 3.0.   Duration: indefinite Supervising physician:  Sherol Dixie, MD  Anticoagulation Clinic Visit History: Patient does not report signs/symptoms of bleeding or thromboembolism  Other recent changes: No diet, medications, lifestyle changes. Anticoagulation Episode Summary     Current INR goal:  2.0-3.0  TTR:  60.9% (12.8 y)  Next INR check:  11/22/2023  INR from last check:  3.3 (10/25/2023)  Weekly max warfarin dose:  --  Target end date:  Indefinite  INR check location:  Anticoagulation Clinic  Preferred lab:  --  Send INR reminders to:  [facility]   Indications   Secondary hypercoagulable state (HCC); hx of recurrent DVTs [D68.69] DVT HX OF (Resolved) [Z86.718] Long term (current) use of anticoagulants [Z79.01]        Comments:  --        Anticoagulation Care Providers     Provider Role Specialty Phone number   Jacquelynne Matters, MD  Internal Medicine [phone]       Allergies  Allergen Reactions   Ace Inhibitors Cough    Current Outpatient Medications:    CALCIUM PO, Take by mouth., Disp: , Rfl:    Cholecalciferol (VITAMIN D -3) 25 MCG (1000 UT) CAPS, Take 1 capsule by mouth daily., Disp: , Rfl:    denosumab  (PROLIA ) 60 MG/ML SOSY injection, Inject 60 mg into the skin every 6 (six) months., Disp: 1 mL, Rfl: 3   levothyroxine  (SYNTHROID ) 88 MCG tablet, Take 1 tablet (88 mcg total) by mouth daily before breakfast., Disp: 90 tablet, Rfl: 3   Magnesium  250 MG TABS, Take 1 tablet by mouth daily., Disp: , Rfl:    Multiple Vitamins-Minerals (ALGAE BASED CALCIUM) TABS, Take 500 mg by mouth daily., Disp: , Rfl:    pantoprazole  (PROTONIX ) 20 MG  tablet, Take 1 tablet (20 mg total) by mouth daily., Disp: 90 tablet, Rfl: 3   warfarin (COUMADIN ) 2 MG tablet, Take 1.5 tablets (3 mg total) by mouth daily at 4 PM., Disp: 44 tablet, Rfl: 3 Past Medical History:  Diagnosis Date   Age-related nuclear cataract, bilateral 07/14/2018   Anemia    Anemia requiring transfusions 06/03/2006   Required post operative transfusion 01/2020. Mixed anemia with hx of macrocytic B12 deficiency anemia.    Arthritis    Bile acid malabsorption syndrome / Diarrhea, resolving over the years. 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 Abigail Abler with [name] is following.   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    Employer: [employer]    Comment: Retired in 2011  Tobacco Use   Smoking status: Former    Current packs/day: 0.00    Average packs/day: 1 pack/day for 20.0 years (20.0 ttl pk-yrs)    Types: Cigarettes    Start date: 07/06/1965    Quit date: 07/06/1985    Years since quitting: 38.3   Smokeless tobacco: Never   Tobacco comments:     Quit 1988  Vaping Use   Vaping status: Never Used  Substance and Sexual Activity   Alcohol use: Yes    Comment: 2 glasses of wine daily    Drug use: No   Sexual activity: Not on file  Other Topics Concern   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       Current Social History 10/18/2019        Patient lives alone in a 2 level home where laundry is in the basement. There is one step without handrail up to the entrance patient uses.      Patient's method of transportation is personal car.      The highest level of education was advanced degree; Master's in Education.      The patient currently retired.      Identified important Relationships are My daughter, good friend, Amalia Badder who is starting with dementia. Several women from church and [employer]AU[employer].      Pets : None       Interests / Fun: Read a lot, take walks, [employer] with church groups and [employer] groups.       Current Stressors: "Cancer. And unable to get knee replacement last year 2/2 Covid pandemic and this year 2/2 Cancer. Hair is starting to fall out 2/2 chemo"      Religious / Personal Beliefs: "Episcopalian, Christian. I believe in God and Heaven and the Trinity."       L. Ducatte, BSN, RN-BC       Social Drivers of Health   Financial Resource Strain: Low Risk  (12/16/2022)   Overall Financial Resource Strain (CARDIA)    Difficulty of Paying Living Expenses: Not hard at all  Food Insecurity: No Food Insecurity (12/16/2022)   Hunger Vital Sign    Worried About Running Out of Food in the Last Year: Never true    Ran Out of Food in the Last Year: Never true  Transportation Needs: No Transportation Needs (12/16/2022)   PRAPARE - Administrator, Civil Service (Medical): No    Lack of Transportation (Non-Medical): No  Physical Activity: Insufficiently Active (12/16/2022)   Exercise Vital Sign    Days of Exercise per Week: 2 days    Minutes  of Exercise per Session: 30 min  Stress: No Stress Concern Present (12/16/2022)   [employer] of Occupational Health - Occupational Stress Questionnaire    Feeling of Stress : Not at all  Social Connections: Not on file   Family History  Problem Relation Age of Onset   Cancer Mother        H&N, smoker   Liver disease Mother  Cancer Sister        lung, 2011, smoker   Other Other        grandmother had mastectomy in her 63's unknown reason   Macular degeneration Father    Diverticulitis Father    Breast cancer Neg Hx     ASSESSMENT Recent Results: The most recent result is correlated with 21 mg per week: Lab Results  Component Value Date   INR 3.3 (A) 10/25/2023   INR 2.7 09/27/2023   INR 2.4 08/30/2023    Anticoagulation Dosing: Description   Take one-and-one-half (1 & 1/2) tablets of your 2 mg lavender colored warfarin tablets by mouth, once-daily, on Sundays, Mondays, Wednesdays, Thursdays, and Saturdays. On TUESDAYS and FRIDAYS, take ONLY ONE (1) TABLET OF WARFARIN.      INR today: Supratherapeutic  PLAN Weekly dose was decreased by 9.6 % to 19 mg per week  Patient Instructions  Patient instructed to take medications as defined in the Anti-coagulation Track section of this encounter.  Patient instructed to take today's dose.  Patient instructed to take one-and-one-half (1 & 1/2) tablets of your 2 mg lavender colored warfarin tablets by mouth, once-daily, on Sundays, Mondays, Wednesdays, Thursdays, and Saturdays. On TUESDAYS and FRIDAYS, take ONLY ONE (1) TABLET OF WARFARIN.  Patient verbalized understanding of these instructions.  Patient advised to contact clinic or seek medical attention if signs/symptoms of bleeding or thromboembolism occur.  Patient verbalized understanding by repeating back information and was advised to contact me if further medication-related questions arise. Patient was also provided an information handout.  Follow-up Return in 4  weeks (on 11/22/2023) for Follow up INR.  Kenda Paula, PharmD, CPP  15 minutes spent face-to-face with the patient during the encounter. 50% of time spent on education, including signs/sx bleeding and clotting, as well as food and drug interactions with warfarin. 50% of time was spent on fingerprick POC INR sample collection,processing, results determination, and documentation in [email or web address].

## 2023-11-03 ENCOUNTER — Other Ambulatory Visit (HOSPITAL_COMMUNITY): Payer: Self-pay

## 2023-11-03 MED ORDER — COLCHICINE 0.6 MG PO TABS
0.6000 mg | ORAL_TABLET | Freq: Two times a day (BID) | ORAL | 1 refills | Status: AC
  Filled 2023-11-03: qty 20, 10d supply, fill #0

## 2023-11-03 MED ORDER — METHYLPREDNISOLONE 4 MG PO TBPK
ORAL_TABLET | ORAL | 0 refills | Status: AC
  Filled 2023-11-03: qty 21, 6d supply, fill #0

## 2023-11-04 ENCOUNTER — Other Ambulatory Visit: Payer: Self-pay

## 2023-11-22 ENCOUNTER — Ambulatory Visit: Admitting: Pharmacist

## 2023-11-22 DIAGNOSIS — D6869 Other thrombophilia: Secondary | ICD-10-CM

## 2023-11-22 DIAGNOSIS — Z7901 Long term (current) use of anticoagulants: Secondary | ICD-10-CM

## 2023-11-22 LAB — POCT INR: INR: 3.2 — AB (ref 2.0–3.0)

## 2023-11-22 NOTE — Patient Instructions (Signed)
 Patient instructed to take medications as defined in the Anti-coagulation Track section of this encounter.  Patient instructed to take today's dose.  Patient instructed to take one-and-one-half (1 & 1/2) of your lavender 2 mg strength warfarin tablets on Sundays, Mondays, Wednesdays and Fridays. On all other days (Tuesdays, Thursdays and Saturdays), take only one (1) of your lavender 2 mg strength warfarin tablets.  Patient verbalized understanding of these instructions.

## 2023-11-22 NOTE — Progress Notes (Signed)
 Anticoagulation Management Pamela [name] is a 78 y.o. female who reports to the clinic for monitoring of warfarin treatment.    Indication: Secondary hypercoagulable state, long term current use of oral anticoagulation with warfarin. Target INR range 2.0 - 3.0. Duration: indefinite Supervising physician: Tod Forward, DO  Anticoagulation Clinic Visit History: Patient does not report signs/symptoms of bleeding or thromboembolism  Other recent changes: No diet, medications, lifestyle changes endorsed at this visit.  Anticoagulation Episode Summary     Current INR goal:  2.0-3.0  TTR:  60.6% (12.9 y)  Next INR check:  12/20/2023  INR from last check:  3.2 (11/22/2023)  Weekly max warfarin dose:  --  Target end date:  Indefinite  INR check location:  Anticoagulation Clinic  Preferred lab:  --  Send INR reminders to:  [facility]   Indications   Secondary hypercoagulable state (HCC); hx of recurrent DVTs [D68.69] DVT HX OF (Resolved) [Z86.718] Long term (current) use of anticoagulants [Z79.01]        Comments:  --        Anticoagulation Care Providers     Provider Role Specialty Phone number   Jacquelynne Matters, MD  Internal Medicine [phone]       Allergies  Allergen Reactions   Ace Inhibitors Cough    Current Outpatient Medications:    CALCIUM PO, Take by mouth., Disp: , Rfl:    Cholecalciferol (VITAMIN D -3) 25 MCG (1000 UT) CAPS, Take 1 capsule by mouth daily., Disp: , Rfl:    colchicine  0.6 MG tablet, Take 1 tablet (0.6 mg total) by mouth 2 (two) times daily for gout flare-ups., Disp: 20 tablet, Rfl: 1   denosumab  (PROLIA ) 60 MG/ML SOSY injection, Inject 60 mg into the skin every 6 (six) months., Disp: 1 mL, Rfl: 3   levothyroxine  (SYNTHROID ) 88 MCG tablet, Take 1 tablet (88 mcg total) by mouth daily before breakfast., Disp: 90 tablet, Rfl: 3   Magnesium  250 MG TABS, Take 1 tablet by mouth daily., Disp: , Rfl:    Multiple Vitamins-Minerals (ALGAE BASED  CALCIUM) TABS, Take 500 mg by mouth daily., Disp: , Rfl:    pantoprazole  (PROTONIX ) 20 MG tablet, Take 1 tablet (20 mg total) by mouth daily., Disp: 90 tablet, Rfl: 3   warfarin (COUMADIN ) 2 MG tablet, Take 1.5 tablets (3 mg total) by mouth daily at 4 PM., Disp: 44 tablet, Rfl: 3 Past Medical History:  Diagnosis Date   Age-related nuclear cataract, bilateral 07/14/2018   Anemia    Anemia requiring transfusions 06/03/2006   Required post operative transfusion 01/2020. Mixed anemia with hx of macrocytic B12 deficiency anemia.    Arthritis    Bile acid malabsorption syndrome / Diarrhea, resolving over the years. 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 Abigail Abler with [name] is following.   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    Employer: [employer]    Comment: Retired in 2011  Tobacco Use   Smoking status: Former    Current packs/day: 0.00    Average packs/day: 1 pack/day for 20.0 years (20.0 ttl pk-yrs)    Types: Cigarettes    Start date: 07/06/1965    Quit date:  07/06/1985    Years since quitting: 38.4   Smokeless tobacco: Never   Tobacco comments:    Quit 1988  Vaping Use   Vaping status: Never Used  Substance and Sexual Activity   Alcohol use: Yes    Comment: 2 glasses of wine daily    Drug use: No   Sexual activity: Not on file  Other Topics Concern   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       Current Social History 10/18/2019        Patient lives alone in a 2 level home where laundry is in the basement. There is one step without handrail up to the entrance patient uses.      Patient's method of transportation is personal car.      The highest level of education was advanced degree; Master's in Education.      The patient currently retired.      Identified important Relationships are My daughter, good friend, Amalia Badder who is starting with dementia. Several women from church and [employer].      Pets : None       Interests / Fun: Read a lot, take walks, [employer] with church groups and [employer] groups.       Current Stressors: "Cancer. And unable to get knee replacement last year 2/2 Covid pandemic and this year 2/2 Cancer. Hair is starting to fall out 2/2 chemo"      Religious / Personal Beliefs: "Episcopalian, Christian. I believe in God and Heaven and the [place]."       L. Ducatte, BSN, RN-BC       Social Drivers of Health   Financial Resource Strain: Low Risk  (12/16/2022)   Overall Financial Resource Strain (CARDIA)    Difficulty of Paying Living Expenses: Not hard at all  Food Insecurity: No Food Insecurity (12/16/2022)   Hunger Vital Sign    Worried About Running Out of Food in the Last Year: Never true    Ran Out of Food in the Last Year: Never true  Transportation Needs: No Transportation Needs (12/16/2022)   PRAPARE - Administrator, Civil Service (Medical): No    Lack of Transportation (Non-Medical): No  Physical Activity:  Insufficiently Active (12/16/2022)   Exercise Vital Sign    Days of Exercise per Week: 2 days    Minutes of Exercise per Session: 30 min  Stress: No Stress Concern Present (12/16/2022)   [employer] of Occupational Health - Occupational Stress Questionnaire    Feeling of Stress : Not at all  Social Connections: Not on file   Family History  Problem Relation Age of Onset  Cancer Mother        H&N, smoker   Liver disease Mother    Cancer Sister        lung, 2011, smoker   Other Other        grandmother had mastectomy in her 16's unknown reason   Macular degeneration Father    Diverticulitis Father    Breast cancer Neg Hx     ASSESSMENT Recent Results: The most recent result is correlated with 19 mg per week: Lab Results  Component Value Date   INR 3.2 (A) 11/22/2023   INR 3.3 (A) 10/25/2023   INR 2.7 09/27/2023    Anticoagulation Dosing: Description   Take one-and-one-half (1 & 1/2) tablets of your 2 mg lavender colored warfarin tablets by mouth, once-daily, on Sundays, Mondays, Wednesdays, Thursdays, and Saturdays. On TUESDAYS, THURSDAYS and SATURDAYS, take ONLY ONE (1) TABLET OF WARFARIN.      INR today: Supratherapeutic  PLAN Weekly dose was decreased by 5% to 18 mg per week  Patient Instructions  Patient instructed to take medications as defined in the Anti-coagulation Track section of this encounter.  Patient instructed to take today's dose.  Patient instructed to take one-and-one-half (1 & 1/2) of your lavender 2 mg strength warfarin tablets on Sundays, Mondays, Wednesdays and Fridays. On all other days (Tuesdays, Thursdays and Saturdays), take only one (1) of your lavender 2 mg strength warfarin tablets.  Patient verbalized understanding of these instructions.  Patient advised to contact clinic or seek medical attention if signs/symptoms of bleeding or thromboembolism occur.  Patient verbalized understanding by repeating back information and was advised to  contact me if further medication-related questions arise. Patient was also provided an information handout.  Follow-up Return in 4 weeks (on 12/20/2023) for Follow up INR.  Kenda Paula, PharmD, CPP Clinical Pharmacist Practitioner  15 minutes spent face-to-face with the patient during the encounter. 50% of time spent on education, including signs/sx bleeding and clotting, as well as food and drug interactions with warfarin. 50% of time was spent on fingerprick POC INR sample collection,processing, results determination, and documentation in [email or web address].

## 2023-11-30 NOTE — Progress Notes (Signed)
Evaluation and management procedures were performed by the Clinical Pharmacy Practitioner under my supervision and collaboration. I have reviewed the Practitioner's note and chart, and I agree with the management and plan as documented above. ° °

## 2023-12-20 ENCOUNTER — Ambulatory Visit (INDEPENDENT_AMBULATORY_CARE_PROVIDER_SITE_OTHER): Admitting: Pharmacist

## 2023-12-20 DIAGNOSIS — D6869 Other thrombophilia: Secondary | ICD-10-CM | POA: Diagnosis not present

## 2023-12-20 DIAGNOSIS — Z7901 Long term (current) use of anticoagulants: Secondary | ICD-10-CM | POA: Diagnosis not present

## 2023-12-20 LAB — POCT INR: INR: 2.1 (ref 2.0–3.0)

## 2023-12-20 NOTE — Progress Notes (Signed)
 Anticoagulation Management Pamela [name] is a 78 y.o. female who reports to the clinic for monitoring of warfarin treatment.    Indication: Secondary hypercoagulable state; Long term current use of oral anticoagulation with warfarin. Target INR range 2.0 - 3.0  Duration: indefinite Supervising physician: Carolyn Cisco. Winfree, MD  Anticoagulation Clinic Visit History: Patient does not report signs/symptoms of bleeding or thromboembolism  Other recent changes: No diet, medications, lifestyle changes endorsed by the patient at this visit.  Anticoagulation Episode Summary     Current INR goal:  2.0-3.0  TTR:  60.7% (13 y)  Next INR check:  01/17/2024  INR from last check:  2.1 (12/20/2023)  Weekly max warfarin dose:  --  Target end date:  Indefinite  INR check location:  Anticoagulation Clinic  Preferred lab:  --  Send INR reminders to:  [facility]   Indications   Secondary hypercoagulable state (HCC); hx of recurrent DVTs [D68.69] DVT HX OF (Resolved) [Z86.718] Long term (current) use of anticoagulants [Z79.01]        Comments:  --        Anticoagulation Care Providers     Provider Role Specialty Phone number   Jacquelynne Matters, MD  Internal Medicine [phone]       Allergies  Allergen Reactions   Ace Inhibitors Cough    Current Outpatient Medications:    CALCIUM PO, Take by mouth., Disp: , Rfl:    Cholecalciferol (VITAMIN D -3) 25 MCG (1000 UT) CAPS, Take 1 capsule by mouth daily., Disp: , Rfl:    colchicine  0.6 MG tablet, Take 1 tablet (0.6 mg total) by mouth 2 (two) times daily for gout flare-ups., Disp: 20 tablet, Rfl: 1   denosumab  (PROLIA ) 60 MG/ML SOSY injection, Inject 60 mg into the skin every 6 (six) months., Disp: 1 mL, Rfl: 3   levothyroxine  (SYNTHROID ) 88 MCG tablet, Take 1 tablet (88 mcg total) by mouth daily before breakfast., Disp: 90 tablet, Rfl: 3   Magnesium  250 MG TABS, Take 1 tablet by mouth daily., Disp: , Rfl:    Multiple  Vitamins-Minerals (ALGAE BASED CALCIUM) TABS, Take 500 mg by mouth daily., Disp: , Rfl:    pantoprazole  (PROTONIX ) 20 MG tablet, Take 1 tablet (20 mg total) by mouth daily., Disp: 90 tablet, Rfl: 3   warfarin (COUMADIN ) 2 MG tablet, Take 1.5 tablets (3 mg total) by mouth daily at 4 PM., Disp: 44 tablet, Rfl: 3 Past Medical History:  Diagnosis Date   Age-related nuclear cataract, bilateral 07/14/2018   Anemia    Anemia requiring transfusions 06/03/2006   Required post operative transfusion 01/2020. Mixed anemia with hx of macrocytic B12 deficiency anemia.    Arthritis    Bile acid malabsorption syndrome / Diarrhea, resolving over the years. 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 Abigail Abler [name] is following.   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    Employer: [employer]    Comment: Retired in 2011  Tobacco Use   Smoking status: Former    Current packs/day: 0.00    Average packs/day: 1 pack/day for 20.0 years (20.0 ttl pk-yrs)    Types: Cigarettes    Start  date: 07/06/1965    Quit date: 07/06/1985    Years since quitting: 38.4   Smokeless tobacco: Never   Tobacco comments:    Quit 1988  Vaping Use   Vaping status: Never Used  Substance and Sexual Activity   Alcohol use: Yes    Comment: 2 glasses of wine daily    Drug use: No   Sexual activity: Not on file  Other Topics Concern   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       Current Social History 10/18/2019        Patient lives alone in a 2 level home where laundry is in the basement. There is one step without handrail up to the entrance patient uses.      Patient's method of transportation is personal car.      The highest level of education was advanced degree; Master's in Education.      The patient currently retired.      Identified important Relationships are My daughter, good friend, Amalia Badder who is starting with dementia. Several women from church and [employer].      Pets : None       Interests / Fun: Read a lot, take walks, [employer] with church groups and [employer] groups.       Current Stressors: Cancer. And unable to get knee replacement last year 2/2 Covid pandemic and this year 2/2 Cancer. Hair is starting to fall out 2/2 chemo      Religious / Personal Beliefs: Episcopalian, Christian. I believe in God and [place] and the [place].       Manfred Seed, BSN, RN-BC       Social Drivers of Health   Financial Resource Strain: Low Risk  (12/16/2022)   Overall Financial Resource Strain (CARDIA)    Difficulty of Paying Living Expenses: Not hard at all  Food Insecurity: No Food Insecurity (12/16/2022)   Hunger Vital Sign    Worried About Running Out of Food in the Last Year: Never true    Ran Out of Food in the Last Year: Never true  Transportation Needs: No Transportation Needs (12/16/2022)   PRAPARE - Administrator, Civil Service (Medical): No    Lack of Transportation (Non-Medical): No   Physical Activity: Insufficiently Active (12/16/2022)   Exercise Vital Sign    Days of Exercise per Week: 2 days    Minutes of Exercise per Session: 30 min  Stress: No Stress Concern Present (12/16/2022)   [employer] of Occupational Health - Occupational Stress Questionnaire    Feeling of Stress : Not at all  Social Connections: Not on file   Family History  Problem  Relation Age of Onset   Cancer Mother        H&N, smoker   Liver disease Mother    Cancer Sister        lung, 2011, smoker   Other Other        grandmother had mastectomy in her 54's unknown reason   Macular degeneration Father    Diverticulitis Father    Breast cancer Neg Hx     ASSESSMENT Recent Results: The most recent result is correlated with 18 mg per week: Lab Results  Component Value Date   INR 2.1 12/20/2023   INR 3.2 (A) 11/22/2023   INR 3.3 (A) 10/25/2023    Anticoagulation Dosing: Description   Take one-and-one-half (1 & 1/2) tablets of your 2 mg lavender colored warfarin tablets by mouth, once-daily, on Sundays, Mondays, Wednesdays, Fridays, and Saturdays. On TUESDAYS and THURSDAYS take only one (1) tablet on Tuesdays and Thursdays.     INR today: Therapeutic  PLAN Weekly dose was increased by 6% to 19 mg per week  Patient Instructions  Patient instructed to take medications as defined in the Anti-coagulation Track section of this encounter.  Patient instructed to take today's dose.  Patient instructed to take one-and-one-half (1 & 1/2) tablets of your 2 mg lavender colored warfarin tablets by mouth, once-daily, on Sundays, Mondays, Wednesdays, Fridays, and Saturdays. On TUESDAYS and THURSDAYS take only one (1) tablet on Tuesdays and Thursdays. Patient verbalized understanding of these instructions.  Patient advised to contact clinic or seek medical attention if signs/symptoms of bleeding or thromboembolism occur.  Patient verbalized understanding by repeating back information and was  advised to contact me if further medication-related questions arise. Patient was also provided an information handout.  Follow-up Return in 4 weeks (on 01/17/2024) for Follow up INR.  Kenda Paula, PharmD, CPP Clinical Pharmacist Practitioner  15 minutes spent face-to-face with the patient during the encounter. 50% of time spent on education, including signs/sx bleeding and clotting, as well as food and drug interactions with warfarin. 50% of time was spent on fingerprick POC INR sample collection,processing, results determination, and documentation in [email or web address].

## 2023-12-20 NOTE — Patient Instructions (Signed)
 Patient instructed to take medications as defined in the Anti-coagulation Track section of this encounter.  Patient instructed to take today's dose.  Patient instructed to take one-and-one-half (1 & 1/2) tablets of your 2 mg lavender colored warfarin tablets by mouth, once-daily, on Sundays, Mondays, Wednesdays, Fridays, and Saturdays. On TUESDAYS and THURSDAYS take only one (1) tablet on Tuesdays and Thursdays. Patient verbalized understanding of these instructions.

## 2023-12-21 ENCOUNTER — Other Ambulatory Visit: Payer: Self-pay | Admitting: Pharmacist

## 2023-12-21 ENCOUNTER — Other Ambulatory Visit (HOSPITAL_COMMUNITY): Payer: Self-pay

## 2023-12-21 MED ORDER — WARFARIN SODIUM 2 MG PO TABS
ORAL_TABLET | ORAL | 3 refills | Status: DC
Start: 2023-12-21 — End: 2024-04-17
  Filled 2023-12-21: qty 40, 30d supply, fill #0
  Filled 2024-01-17: qty 40, 30d supply, fill #1
  Filled 2024-02-21: qty 40, 30d supply, fill #2
  Filled 2024-03-20: qty 40, 30d supply, fill #3

## 2023-12-22 NOTE — Progress Notes (Signed)
Evaluation and management procedures were performed by the Clinical Pharmacy Practitioner under my supervision and collaboration. I have reviewed the Practitioner's note and chart, and I agree with the management and plan as documented above. ° °

## 2024-01-06 ENCOUNTER — Other Ambulatory Visit (HOSPITAL_COMMUNITY): Payer: Self-pay

## 2024-01-17 ENCOUNTER — Other Ambulatory Visit (HOSPITAL_COMMUNITY): Payer: Self-pay

## 2024-01-17 ENCOUNTER — Ambulatory Visit (INDEPENDENT_AMBULATORY_CARE_PROVIDER_SITE_OTHER): Admitting: Pharmacist

## 2024-01-17 DIAGNOSIS — D6869 Other thrombophilia: Secondary | ICD-10-CM

## 2024-01-17 DIAGNOSIS — Z7901 Long term (current) use of anticoagulants: Secondary | ICD-10-CM

## 2024-01-17 LAB — POCT INR: INR: 2.9 (ref 2.0–3.0)

## 2024-01-17 NOTE — Progress Notes (Signed)
 Anticoagulation Management Pamela [name] is a 78 y.o. female who reports to the clinic for monitoring of warfarin treatment.    Indication: Secondary hypercoagulable state with history of recurrence of DVTs; Long term current use of oral anticoagulant, warfarin. Target INR range 2.0 - 3.0.  Duration: indefinite Supervising physician: Ronnald Sergeant, MD  Anticoagulation Clinic Visit History: Patient does not report signs/symptoms of bleeding or thromboembolism  Other recent changes: No diet, medications, lifestyle changes endorsed by the patient at this visit.  Anticoagulation Episode Summary     Current INR goal:  2.0-3.0  TTR:  60.9% (13.1 y)  Next INR check:  02/14/2024  INR from last check:  2.9 (01/17/2024)  Weekly max warfarin dose:  --  Target end date:  Indefinite  INR check location:  Anticoagulation Clinic  Preferred lab:  --  Send INR reminders to:  [facility]   Indications   Secondary hypercoagulable state (HCC); hx of recurrent DVTs [D68.69] DVT HX OF (Resolved) [Z86.718] Long term (current) use of anticoagulants [Z79.01]        Comments:  --        Anticoagulation Care Providers     Provider Role Specialty Phone number   Otho Darnelle BRAVO, MD  Internal Medicine [phone]       Allergies  Allergen Reactions   Ace Inhibitors Cough    Current Outpatient Medications:    CALCIUM PO, Take by mouth., Disp: , Rfl:    Cholecalciferol (VITAMIN D -3) 25 MCG (1000 UT) CAPS, Take 1 capsule by mouth daily., Disp: , Rfl:    colchicine  0.6 MG tablet, Take 1 tablet (0.6 mg total) by mouth 2 (two) times daily for gout flare-ups., Disp: 20 tablet, Rfl: 1   denosumab  (PROLIA ) 60 MG/ML SOSY injection, Inject 60 mg into the skin every 6 (six) months., Disp: 1 mL, Rfl: 3   levothyroxine  (SYNTHROID ) 88 MCG tablet, Take 1 tablet (88 mcg total) by mouth daily before breakfast., Disp: 90 tablet, Rfl: 3   Magnesium  250 MG TABS, Take 1 tablet by mouth daily., Disp: , Rfl:     Multiple Vitamins-Minerals (ALGAE BASED CALCIUM) TABS, Take 500 mg by mouth daily., Disp: , Rfl:    pantoprazole  (PROTONIX ) 20 MG tablet, Take 1 tablet (20 mg total) by mouth daily., Disp: 90 tablet, Rfl: 3   warfarin (COUMADIN ) 2 MG tablet, Take one-and-one-half (1 & 1/2) tablets on all days of the week, EXCEPT on TUESDAYS and THURSDAYS. Take ONLY ONE (1) tablet on Tuesdays and Thursdays., Disp: 40 tablet, Rfl: 3 Past Medical History:  Diagnosis Date   Age-related nuclear cataract, bilateral 07/14/2018   Anemia    Anemia requiring transfusions 06/03/2006   Required post operative transfusion 01/2020. Mixed anemia with hx of macrocytic B12 deficiency anemia.    Arthritis    Bile acid malabsorption syndrome / Diarrhea, resolving over the years. 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 [facility] with [name] is following.   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    Employer:  [employer]    Comment: Retired in 2011  Tobacco Use   Smoking status: Former    Current  packs/day: 0.00    Average packs/day: 1 pack/day for 20.0 years (20.0 ttl pk-yrs)    Types: Cigarettes    Start date: 07/06/1965    Quit date: 07/06/1985    Years since quitting: 38.5   Smokeless tobacco: Never   Tobacco comments:    Quit 1988  Vaping Use   Vaping status: Never Used  Substance and Sexual Activity   Alcohol use: Yes    Comment: 2 glasses of wine daily    Drug use: No   Sexual activity: Not on file  Other Topics Concern   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       Current Social History 10/18/2019        Patient lives alone in a 2 level home where laundry is in the basement. There is one step without handrail up to the entrance patient uses.      Patient's method of transportation is personal car.      The highest level of education was advanced degree; Master's in Education.      The patient currently retired.      Identified important Relationships are My daughter, good friend, Devere who is starting with dementia. Several women from church and [employer].      Pets : None       Interests / Fun: Read a lot, take walks, [employer] with church groups and [employer] groups.       Current Stressors: Cancer. And unable to get knee replacement last year 2/2 Covid pandemic and this year 2/2 Cancer. Hair is starting to fall out 2/2 chemo      Religious / Personal Beliefs: Episcopalian, Christian. I believe in God and [place] and the [place].       [name], BSN, RN-BC       Social Drivers of Health   Financial Resource Strain: Low Risk  (12/16/2022)   Overall Financial Resource Strain (CARDIA)    Difficulty of Paying Living Expenses: Not hard at all  Food Insecurity: No Food Insecurity (12/16/2022)   Hunger Vital Sign    Worried About Running Out of Food in the Last Year: Never true    Ran Out of Food in the Last Year: Never true  Transportation Needs: No Transportation Needs (12/16/2022)    PRAPARE - Administrator, Civil Service (Medical): No    Lack of Transportation (Non-Medical): No  Physical Activity: Insufficiently Active (12/16/2022)   Exercise Vital Sign    Days of Exercise per Week: 2 days    Minutes of Exercise per Session: 30 min  Stress: No Stress Concern Present (12/16/2022)   [employer] of Occupational Health - Occupational Stress Questionnaire    Feeling  of Stress : Not at all  Social Connections: Not on file   Family History  Problem Relation Age of Onset   Cancer Mother        H&N, smoker   Liver disease Mother    Cancer Sister        lung, 2011, smoker   Other Other        grandmother had mastectomy in her 9's unknown reason   Macular degeneration Father    Diverticulitis Father    Breast cancer Neg Hx     ASSESSMENT Recent Results: The most recent result is correlated with 19 mg per week: Lab Results  Component Value Date   INR 2.9 01/17/2024   INR 2.1 12/20/2023   INR 3.2 (A) 11/22/2023    Anticoagulation Dosing: Description   Take one-and-one-half (1 & 1/2) tablets of your 2 mg lavender colored warfarin tablets by mouth, once-daily, on all days of the week, EXCEPT on MONDAYS, WEDNESDAYS and FRIDAYS--take ONLY ONE (1) tablet on these days.      INR today: Therapeutic  PLAN Weekly dose was decreased by 5% to 18 mg per week  Patient Instructions  Patient instructed to take medications as defined in the Anti-coagulation Track section of this encounter.  Patient instructed to take today's dose.  Patient instructed to take one-and-one-half (1 & 1/2) tablets of your 2 mg lavender colored warfarin tablets by mouth, once-daily, on all days of the week, EXCEPT on MONDAYS, WEDNESDAYS and FRIDAYS--take ONLY ONE (1) tablet on these days.  Patient verbalized understanding of these instructions.  Patient advised to contact clinic or seek medical attention if signs/symptoms of bleeding or thromboembolism occur.  Patient  verbalized understanding by repeating back information and was advised to contact me if further medication-related questions arise. Patient was also provided an information handout.  Follow-up Return in 4 weeks (on 02/14/2024) for Follow up INR.  Lynwood KATHEE Lites, PharmD, CPP Clinical Pharmacist Practitioner  15 minutes spent face-to-face with the patient during the encounter. 50% of time spent on education, including signs/sx bleeding and clotting, as well as food and drug interactions with warfarin. 50% of time was spent on fingerprick POC INR sample collection,processing, results determination, and documentation in [email or web address].

## 2024-01-17 NOTE — Progress Notes (Signed)
 Evaluation and management procedures were performed by the Clinical Pharmacy Practitioner under my supervision and collaboration. I have reviewed the Practitioner's note and chart, and I agree with the management and plan as documented above.   Dickie La, MD

## 2024-01-17 NOTE — Patient Instructions (Signed)
 Patient instructed to take medications as defined in the Anti-coagulation Track section of this encounter.  Patient instructed to take today's dose.  Patient instructed to take one-and-one-half (1 & 1/2) tablets of your 2 mg lavender colored warfarin tablets by mouth, once-daily, on all days of the week, EXCEPT on MONDAYS, WEDNESDAYS and FRIDAYS--take ONLY ONE (1) tablet on these days.  Patient verbalized understanding of these instructions.

## 2024-01-25 ENCOUNTER — Other Ambulatory Visit: Payer: Self-pay

## 2024-01-25 ENCOUNTER — Other Ambulatory Visit (HOSPITAL_COMMUNITY): Payer: Self-pay

## 2024-01-26 ENCOUNTER — Ambulatory Visit

## 2024-01-26 VITALS — Ht 64.0 in | Wt 175.0 lb

## 2024-01-26 DIAGNOSIS — Z0000 Encounter for general adult medical examination without abnormal findings: Secondary | ICD-10-CM

## 2024-01-26 NOTE — Progress Notes (Signed)
 Because this visit was a virtual/telehealth visit,  certain criteria was not obtained, such a blood pressure, CBG if applicable, and timed get up and go. Any medications not marked as taking were not mentioned during the medication reconciliation part of the visit. Any vitals not documented were not able to be obtained due to this being a telehealth visit or patient was unable to self-report a recent blood pressure reading due to a lack of equipment at home via telehealth. Vitals that have been documented are verbally provided by the patient.   Subjective:   Pamela [name] is a 78 y.o. who presents for a Medicare Wellness preventive visit.  As a reminder, Annual Wellness Visits don't include a physical exam, and some assessments may be limited, especially if this visit is performed virtually. We may recommend an in-person follow-up visit with your provider if needed.  Visit Complete: Virtual I connected with  Pamela [name] on 01/26/24 by a audio enabled telemedicine application and verified that I am speaking with the correct person using two identifiers.  Patient Location: Home  Provider Location: Home Office  I discussed the limitations of evaluation and management by telemedicine. The patient expressed understanding and agreed to proceed.  Vital Signs: Because this visit was a virtual/telehealth visit, some criteria may be missing or patient reported. Any vitals not documented were not able to be obtained and vitals that have been documented are patient reported.  VideoDeclined- This patient declined Librarian, academic. Therefore the visit was completed with audio only.  Persons Participating in Visit: Patient.  AWV Questionnaire: No: Patient Medicare AWV questionnaire was not completed prior to this visit.  Cardiac Risk Factors include: advanced age (>39men, >7 women);sedentary lifestyle;hypertension;obesity (BMI >30kg/m2)     Objective:     Today's Vitals   01/26/24 1547  Weight: 175 lb (79.4 kg)  Height: 5' 4 (1.626 m)  PainSc: 0-No pain   Body mass index is 30.04 kg/m.     01/26/2024    3:48 PM 05/13/2023    9:35 AM 12/31/2022    9:56 AM 12/16/2022   10:03 AM 07/23/2022    9:52 AM 09/18/2021    9:25 AM 03/20/2021   11:13 AM  Advanced Directives  Does Patient Have a Medical Advance Directive? Yes Yes Yes Yes Yes Yes Yes  Type of Estate agent of [place];Living will Healthcare Power of [place];Living will Healthcare Power of [place];Living will Healthcare Power of [place];Living will Healthcare Power of [place];Living will Healthcare Power of [place];Living will Healthcare Power of [place];Living will  Does patient want to make changes to medical advance directive? No - Patient declined  No - Patient declined  No - Patient declined No - Patient declined No - Patient declined  Copy of Healthcare Power of Attorney in Chart? Yes - validated most recent copy scanned in chart (See row information) No - copy requested Yes - validated most recent copy scanned in chart (See row information) Yes - validated most recent copy scanned in chart (See row information) Yes - validated most recent copy scanned in chart (See row information) Yes - validated most recent copy scanned in chart (See row information) Yes - validated most recent copy scanned in chart (See row information)    Current Medications (verified) Outpatient Encounter Medications as of 01/26/2024  Medication Sig   CALCIUM PO Take by mouth.   Cholecalciferol (VITAMIN D -3) 25 MCG (1000 UT) CAPS Take 1 capsule by mouth daily.   colchicine  0.6  MG tablet Take 1 tablet (0.6 mg total) by mouth 2 (two) times daily for gout flare-ups.   denosumab  (PROLIA ) 60 MG/ML SOSY injection Inject 60 mg into the skin every 6 (six) months.   levothyroxine  (SYNTHROID ) 88 MCG tablet Take 1 tablet (88 mcg total) by mouth daily before breakfast.   Magnesium  250 MG TABS  Take 1 tablet by mouth daily.   Multiple Vitamins-Minerals (ALGAE BASED CALCIUM) TABS Take 500 mg by mouth daily.   pantoprazole  (PROTONIX ) 20 MG tablet Take 1 tablet (20 mg total) by mouth daily.   warfarin (COUMADIN ) 2 MG tablet Take one-and-one-half (1 & 1/2) tablets on all days of the week, EXCEPT on TUESDAYS and THURSDAYS. Take ONLY ONE (1) tablet on Tuesdays and Thursdays.   No facility-administered encounter medications on file as of 01/26/2024.    Allergies (verified) Ace inhibitors   History: Past Medical History:  Diagnosis Date   Age-related nuclear cataract, bilateral 07/14/2018   Anemia    Anemia requiring transfusions 06/03/2006   Required post operative transfusion 01/2020. Mixed anemia with hx of macrocytic B12 deficiency anemia.    Arthritis    Bile acid malabsorption syndrome / Diarrhea, resolving over the years. 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 [facility] with [name] is following.   PONV (postoperative nausea and vomiting)    S/P total knee arthroplasty, right 12/10/2020   Urothelial cancer (HCC) dx'd 09/05/2019   Past Surgical History:  Procedure Laterality Date   ANKLE ARTHROPLASTY Right 2008   BILIARY DILATION  05/13/2023   Procedure: BILIARY DILATION;  Surgeon: Rosalie Kitchens, MD;  Location: [facility];  Service: Gastroenterology;;   CHOLECYSTECTOMY     2004   CYSTOSCOPY WITH INJECTION N/A  01/17/2020   Procedure: CYSTOSCOPY WITH INJECTION;  Surgeon: Alvaro Hummer, MD;  Location: [facility];  Service: Urology;  Laterality: N/A;   ERCP Bilateral 05/13/2023   Procedure: ENDOSCOPIC RETROGRADE CHOLANGIOPANCREATOGRAPHY (ERCP);  Surgeon: Rosalie Kitchens, MD;  Location: [facility];  Service: Gastroenterology;  Laterality: Bilateral;   HAND CONTRACTURE RELEASE Left 1993   IR IMAGING GUIDED PORT INSERTION  09/20/2019   IR REMOVAL TUN ACCESS W/ PORT W/O FL MOD SED  09/06/2020   KNEE ARTHROSCOPY Right 2004   OVARY SURGERY Right 1998   REMOVAL OF STONES  05/13/2023   Procedure: REMOVAL OF STONES;  Surgeon: Rosalie Kitchens, MD;  Location: [facility];  Service: Gastroenterology;;   ROBOT ASSISTED LAPAROSCOPIC COMPLETE CYSTECT ILEAL CONDUIT N/A 01/17/2020   Procedure: XI ROBOTIC ASSISTED LAPAROSCOPIC COMPLETE CYSTECT ILEAL CONDUIT, XI ROBOTIC ASSISTED LAPAPRSCOPIC HYSTERECTOMY, LEFT OOPHERECTOMY,SALPINGECTOMY;LYMPHADENECTOMY;  Surgeon: Alvaro Hummer, MD;  Location: [facility];  Service: Urology;  Laterality: N/A;  6 HRS   SPHINCTEROTOMY  05/13/2023   Procedure: SPHINCTEROTOMY;  Surgeon: Rosalie Kitchens, MD;  [facility];  Service: Gastroenterology;;   TONSILLECTOMY     TOTAL KNEE ARTHROPLASTY Right 12/10/2020  Procedure: TOTAL KNEE ARTHROPLASTY;  Surgeon: Beverley Evalene BIRCH, MD;  Location: [facility];  Service: Orthopedics;  Laterality: Right;   TRANSURETHRAL RESECTION OF BLADDER TUMOR WITH MITOMYCIN -C N/A 08/28/2019   Procedure: TRANSURETHRAL RESECTION OF BLADDER TUMOR;  Surgeon: Carolee Sherwood BIRCH DOUGLAS, MD;  Location: [facility];  Service: Urology;  Laterality: N/A;   wisdom teeth extractcion      Family History  Problem Relation Age of Onset   Cancer Mother        H&N, smoker   Liver disease Mother    Cancer Sister        lung, 2011, smoker   Other Other        grandmother had mastectomy in her 65's unknown reason   Macular degeneration Father    Diverticulitis Father    Breast cancer Neg Hx    Social  History   Socioeconomic History   Marital status: Widowed    Spouse name: Not on file   Number of children: Not on file   Years of education: Not on file   Highest education level: Not on file  Occupational History   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    Employer: [employer]    Comment: Retired in 2011  Tobacco Use   Smoking status: Former    Current packs/day: 0.00    Average packs/day: 1 pack/day for 20.0 years (20.0 ttl pk-yrs)    Types: Cigarettes    Start date: 07/06/1965    Quit date: 07/06/1985    Years since quitting: 38.5   Smokeless tobacco: Never   Tobacco comments:    Quit 1988  Vaping Use   Vaping status: Never Used  Substance and Sexual Activity   Alcohol use: Yes    Comment: 2 glasses of wine daily    Drug use: No   Sexual activity: Not on file  Other Topics Concern   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       Current Social History 10/18/2019        Patient lives alone in a 2 level home where laundry is in the basement. There is one step without handrail up to the entrance patient uses.      Patient's method of transportation is personal car.      The highest level of education was advanced degree; Master's in Education.      The patient currently retired.      Identified important Relationships are My daughter, good friend, Devere who is starting with dementia. Several women from church and AAUW.      Pets : None       Interests / Fun: Read a lot, take walks, Yahoo with church groups and AAUW groups.       Current Stressors: Cancer. And unable to get knee replacement last year 2/2 Covid pandemic and this year 2/2 Cancer. Hair is starting to fall out 2/2 chemo      Religious / Personal Beliefs: Episcopalian, Christian. I believe in God and [place] and the [place].       [name], BSN, RN-BC       Social Drivers of Health   Financial Resource  Strain: Low Risk  (12/16/2022)   Overall Financial Resource Strain (CARDIA)    Difficulty of Paying Living Expenses: Not hard at all  Food Insecurity: No Food Insecurity (01/26/2024)   Hunger Vital Sign  Worried About Programme researcher, broadcasting/film/video in the Last Year: Never true    Ran Out of Food in the Last Year: Never true  Transportation Needs: No Transportation Needs (01/26/2024)   PRAPARE - Administrator, Civil Service (Medical): No    Lack of Transportation (Non-Medical): No  Physical Activity: Insufficiently Active (01/26/2024)   Exercise Vital Sign    Days of Exercise per Week: 2 days    Minutes of Exercise per Session: 30 min  Stress: No Stress Concern Present (01/26/2024)   [employer] of Occupational Health - Occupational Stress Questionnaire    Feeling of Stress: Not at all  Social Connections: Moderately Integrated (01/26/2024)   Social Connection and Isolation Panel    Frequency of Communication with Friends and Family: More than three times a week    Frequency of Social Gatherings with Friends and Family: More than three times a week    Attends Religious Services: More than 4 times per year    Active Member of [employer] or Organizations: Yes    Attends Banker Meetings: More than 4 times per year    Marital Status: Widowed    Tobacco Counseling Counseling given: Not Answered Tobacco comments: Quit 1988    Clinical Intake:  Pre-visit preparation completed: Yes  Pain : No/denies pain Pain Score: 0-No pain     BMI - recorded: 30.04 Nutritional Status: BMI 25 -29 Overweight Nutritional Risks: None Diabetes: No  Lab Results  Component Value Date   HGBA1C 5.0 02/11/2017   HGBA1C 5.2 03/02/2012     How often do you need to have someone help you when you read instructions, pamphlets, or other written materials from your doctor or pharmacy?: 1 - Never What is the last grade level you completed in school?: MASTER'S IN EDUCATION  Interpreter  Needed?: No  Information entered by :: Lorissa Kishbaugh N. Karis Rilling, LPN.   Activities of Daily Living     01/26/2024    3:52 PM  In your present state of health, do you have any difficulty performing the following activities:  Hearing? 1  Comment Wears hearing aids.  Vision? 0  Difficulty concentrating or making decisions? 0  Comment BSE: reading, jigsaw puzzles, games on the phone, etc.  Walking or climbing stairs? 0  Dressing or bathing? 0  Doing errands, shopping? 0  Preparing Food and eating ? N  Using the Toilet? N  In the past six months, have you accidently leaked urine? N  Do you have problems with loss of bowel control? N  Managing your Medications? N  Managing your Finances? N  Housekeeping or managing your Housekeeping? N    Patient Care Team: Trudy Mliss Dragon, MD as PCP - General (Internal Medicine) Rosalie Kitchens, MD as Consulting Physician (Gastroenterology) Beverley Evalene BIRCH, MD as Attending Physician (Orthopedic Surgery) Timmie Norris, MD as Consulting Physician (Obstetrics and Gynecology) Amadeo Windell SAILOR, MD (Inactive) as Consulting Physician (Oncology) Ruthell Lynwood NOVAK, RPH-CPP as Pharmacist (Pharmacist) Cheree Banks, MD as Referring Physician (Ophthalmology) Leslee Reusing, MD as Consulting Physician (Ophthalmology) Alvaro Ricardo NOVAK Raddle., MD as Consulting Physician (Urology)  I have updated your Care Teams any recent Medical Services you may have received from other providers in the past year.     Assessment:   This is a routine wellness examination for [place].  Hearing/Vision screen Hearing Screening - Comments:: Patient wears hearing aids.  Vision Screening - Comments:: Wears rx glasses - up to date with routine eye exams with Reusing  McCuen, MD. Cataracts surgery scheduled in August 2025.     Goals Addressed             This Visit's Progress    01/26/2024: To stay fabulous.         Depression Screen     01/26/2024    3:54 PM  12/31/2022    9:57 AM 12/16/2022   10:05 AM 07/23/2022    9:55 AM 09/18/2021    9:27 AM 03/20/2021   11:14 AM 03/07/2020    9:05 AM  PHQ 2/9 Scores  PHQ - 2 Score 0 0 0 0 0 0 0  PHQ- 9 Score 0          Fall Risk     01/26/2024    3:51 PM 12/31/2022    9:50 AM 12/16/2022   10:04 AM 07/23/2022    9:52 AM 09/18/2021    9:27 AM  Fall Risk   Falls in the past year? 0 1 1 0 0  Comment   fell out of bed    Number falls in past yr: 0 0 0 0 0  Injury with Fall? 0 0 0 0 0  Risk for fall due to : No Fall Risks  Medication side effect    Follow up Falls evaluation completed Falls evaluation completed Falls prevention discussed;Education provided;Falls evaluation completed Falls evaluation completed  Falls evaluation completed      Data saved with a previous flowsheet row definition    MEDICARE RISK AT HOME:  Medicare Risk at Home Any stairs in or around the home?: No If so, are there any without handrails?: No Home free of loose throw rugs in walkways, pet beds, electrical cords, etc?: Yes Adequate lighting in your home to reduce risk of falls?: Yes Life alert?: No Use of a cane, walker or w/c?: No Grab bars in the bathroom?: Yes Shower chair or bench in shower?: Yes Elevated toilet seat or a handicapped toilet?: Yes  TIMED UP AND GO:  Was the test performed?  No  Cognitive Function: Declined/Normal: No cognitive concerns noted by patient or family. Patient alert, oriented, able to answer questions appropriately and recall recent events. No signs of memory loss or confusion.    01/26/2024    3:49 PM  MMSE - Mini Mental State Exam  Not completed: Unable to complete        01/26/2024    4:03 PM 12/16/2022   10:07 AM  6CIT Screen  What Year? 0 points 0 points  What month? 0 points 0 points  What time? 0 points 0 points  Count back from 20 0 points 0 points  Months in reverse 0 points 0 points  Repeat phrase 0 points 0 points  Total Score 0 points 0 points     Immunizations Immunization History  Administered Date(s) Administered   Fluad Quad(high Dose 65+) 03/20/2021, 04/06/2022   Influenza Whole 03/06/2010, 03/04/2011   Influenza,inj,Quad PF,6+ Mos 03/19/2014, 03/04/2015, 03/22/2017, 02/24/2018, 05/01/2019, 03/07/2020   Influenza-Unspecified 04/05/2013   Moderna Covid-19 Fall Seasonal Vaccine 39yrs & older 10/22/2022   PFIZER(Purple Top)SARS-COV-2 Vaccination 07/28/2019, 08/18/2019, 04/24/2020   Pfizer Covid-19 Vaccine Bivalent Booster 59yrs & up 03/27/2021   Pneumococcal Conjugate-13 12/13/2014   Pneumococcal Polysaccharide-23 04/09/2009   Respiratory Syncytial Virus Vaccine,Recomb Aduvanted(Arexvy) 09/10/2022   Td 02/16/2007   Tdap 06/21/2017   Zoster Recombinant(Shingrix ) 07/23/2021, 10/15/2021    Screening Tests Health Maintenance  Topic Date Due   COVID-19 Vaccine (6 - 2024-25 season) 03/07/2023   INFLUENZA  VACCINE  02/04/2024   Medicare Annual Wellness (AWV)  01/25/2025   DTaP/Tdap/Td (3 - Td or Tdap) 06/22/2027   Pneumococcal Vaccine: 50+ Years  Completed   DEXA SCAN  Completed   Hepatitis C Screening  Completed   Zoster Vaccines- Shingrix   Completed   Hepatitis B Vaccines  Aged Out   HPV VACCINES  Aged Out   Meningococcal B Vaccine  Aged Out   Colonoscopy  Discontinued    Health Maintenance  Health Maintenance Due  Topic Date Due   COVID-19 Vaccine (6 - 2024-25 season) 03/07/2023   Health Maintenance Items Addressed: Yes Patient aware of current care gaps. [facility] was verified no recent immunizations.  Additional Screening:  Vision Screening: Recommended annual ophthalmology exams for early detection of glaucoma and other disorders of the eye. Would you like a referral to an eye doctor? No    Dental Screening: Recommended annual dental exams for proper oral hygiene  Community Resource Referral / Chronic Care Management: CRR required this visit?  No   CCM required this visit?  No   Plan:    I have  personally reviewed and noted the following in the patient's chart:   Medical and social history Use of alcohol, tobacco or illicit drugs  Current medications and supplements including opioid prescriptions. Patient is not currently taking opioid prescriptions. Functional ability and status Nutritional status Physical activity Advanced directives List of other physicians Hospitalizations, surgeries, and ER visits in previous 12 months Vitals Screenings to include cognitive, depression, and falls Referrals and appointments  In addition, I have reviewed and discussed with patient certain preventive protocols, quality metrics, and best practice recommendations. A written personalized care plan for preventive services as well as general preventive health recommendations were provided to patient.   Roz LOISE Fuller, LPN   [id number]   After Visit Summary: (MyChart) Due to this being a telephonic visit, the after visit summary with patients personalized plan was offered to patient via MyChart   Notes: Patient aware of current care gaps. [facility] was verified no recent immunizations.

## 2024-01-26 NOTE — Patient Instructions (Addendum)
 Pamela [name] , Thank you for taking time out of your busy schedule to complete your Annual Wellness Visit with me. I enjoyed our conversation and look forward to speaking with you again next year. I, as well as your care team,  appreciate your ongoing commitment to your health goals. Please review the following plan we discussed and let me know if I can assist you in the future. Your Game plan/ To Do List    Referrals: If you haven't heard from the office you've been referred to, please reach out to them at the phone provided.   Follow up Visits: Next Medicare AWV with our clinical staff: 01/31/2025  at 3:40 p.m. phone visit with [facility] Have you seen your provider in the last 6 months (3 months if uncontrolled diabetes)? Yes Next Office Visit with your provider: Patient due in September 2025 for follow up appt with Dr. Trudy  Clinician Recommendations:  Aim for 30 minutes of exercise or brisk walking, 6-8 glasses of water , and 5 servings of fruits and vegetables each day.       This is a list of the screening recommended for you and due dates:  Health Maintenance  Topic Date Due   COVID-19 Vaccine (6 - 2024-25 season) 03/07/2023   Flu Shot  02/04/2024   Medicare Annual Wellness Visit  01/25/2025   DTaP/Tdap/Td vaccine (3 - Td or Tdap) 06/22/2027   Pneumococcal Vaccine for age over 36  Completed   DEXA scan (bone density measurement)  Completed   Hepatitis C Screening  Completed   Zoster (Shingles) Vaccine  Completed   Hepatitis B Vaccine  Aged Out   HPV Vaccine  Aged Out   Meningitis B Vaccine  Aged Out   Colon Cancer Screening  Discontinued    Advanced directives: (In Chart) A copy of your advanced directives are scanned into your chart should your provider ever need it. Advance Care Planning is important because it:  [x]  Makes sure you receive the medical care that is consistent with your values, goals, and preferences  [x]  It provides guidance to your family and loved ones and  reduces their decisional burden about whether or not they are making the right decisions based on your wishes.  Follow the link provided in your after visit summary or read over the paperwork we have mailed to you to help you started getting your Advance Directives in place. If you need assistance in completing these, please reach out to us  so that we can help you!  See attachments for Preventive Care and Fall Prevention Tips.

## 2024-01-27 ENCOUNTER — Other Ambulatory Visit: Payer: Self-pay

## 2024-01-31 ENCOUNTER — Other Ambulatory Visit (HOSPITAL_COMMUNITY): Payer: Self-pay

## 2024-02-14 ENCOUNTER — Ambulatory Visit: Admitting: Pharmacist

## 2024-02-14 DIAGNOSIS — D6869 Other thrombophilia: Secondary | ICD-10-CM | POA: Diagnosis not present

## 2024-02-14 DIAGNOSIS — Z7901 Long term (current) use of anticoagulants: Secondary | ICD-10-CM

## 2024-02-14 LAB — POCT INR: INR: 2 (ref 2.0–3.0)

## 2024-02-14 NOTE — Progress Notes (Signed)
 Anticoagulation Management Pamela [name] is a 78 y.o. female who reports to the clinic for monitoring of warfarin treatment.    Indication: Secondary hypercoagulable state, H/O DVT; Current, long term use of oral anticoagulation with warfarin, target INR range 2.0 - 3.0.   Duration: indefinite Supervising physician: Ronnald Sergeant, MD  Anticoagulation Clinic Visit History: Patient does not report signs/symptoms of bleeding or thromboembolism  Other recent changes: No diet, medications, lifestyle changes.  Anticoagulation Episode Summary     Current INR goal:  2.0-3.0  TTR:  61.2% (13.1 y)  Next INR check:  03/20/2024  INR from last check:  2.0 (02/14/2024)  Weekly max warfarin dose:  --  Target end date:  Indefinite  INR check location:  Anticoagulation Clinic  Preferred lab:  --  Send INR reminders to:  [facility]   Indications   Secondary hypercoagulable state (HCC); hx of recurrent DVTs [D68.69] DVT HX OF (Resolved) [Z86.718] Long term (current) use of anticoagulants [Z79.01]        Comments:  --        Anticoagulation Care Providers     Provider Role Specialty Phone number   Otho Darnelle BRAVO, MD  Internal Medicine [phone]       Allergies  Allergen Reactions   Ace Inhibitors Cough    Current Outpatient Medications:    Cholecalciferol (VITAMIN D -3) 25 MCG (1000 UT) CAPS, Take 1 capsule by mouth daily., Disp: , Rfl:    colchicine  0.6 MG tablet, Take 1 tablet (0.6 mg total) by mouth 2 (two) times daily for gout flare-ups., Disp: 20 tablet, Rfl: 1   denosumab  (PROLIA ) 60 MG/ML SOSY injection, Inject 60 mg into the skin every 6 (six) months., Disp: 1 mL, Rfl: 3   levothyroxine  (SYNTHROID ) 88 MCG tablet, Take 1 tablet (88 mcg total) by mouth daily before breakfast., Disp: 90 tablet, Rfl: 3   Magnesium  250 MG TABS, Take 1 tablet by mouth daily., Disp: , Rfl:    Multiple Vitamins-Minerals (ALGAE BASED CALCIUM) TABS, Take 500 mg by mouth daily., Disp: , Rfl:     pantoprazole  (PROTONIX ) 20 MG tablet, Take 1 tablet (20 mg total) by mouth daily., Disp: 90 tablet, Rfl: 3   warfarin (COUMADIN ) 2 MG tablet, Take one-and-one-half (1 & 1/2) tablets on all days of the week, EXCEPT on TUESDAYS and THURSDAYS. Take ONLY ONE (1) tablet on Tuesdays and Thursdays., Disp: 40 tablet, Rfl: 3   CALCIUM PO, Take by mouth. (Patient not taking: Reported on 02/14/2024), Disp: , Rfl:  Past Medical History:  Diagnosis Date   Age-related nuclear cataract, bilateral 07/14/2018   Anemia    Anemia requiring transfusions 06/03/2006   Required post operative transfusion 01/2020. Mixed anemia with hx of macrocytic B12 deficiency anemia.    Arthritis    Bile acid malabsorption syndrome / Diarrhea, resolving over the years. 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 [name] is following.   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    Employer: [employer]    Comment: Retired in 2011  Tobacco Use   Smoking status: Former    Current packs/day: 0.00     Average packs/day: 1 pack/day for 20.0 years (20.0 ttl pk-yrs)    Types: Cigarettes    Start date: 07/06/1965    Quit date: 07/06/1985    Years since quitting: 38.6   Smokeless tobacco: Never   Tobacco comments:    Quit 1988  Vaping Use   Vaping status: Never Used  Substance and Sexual Activity   Alcohol use: Yes    Comment: 2 glasses of wine daily    Drug use: No   Sexual activity: Not on file  Other Topics Concern   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       Current Social History 10/18/2019        Patient lives alone in a 2 level home where laundry is in the basement. There is one step without handrail up to the entrance patient uses.      Patient's method of transportation is personal car.      The highest level of education was advanced degree; Master's in Education.      The patient currently retired.      Identified important Relationships are My daughter, good friend, Pamela [name] who is starting with dementia. Several women from church and [employer].      Pets : None       Interests / Fun: Read a lot, take walks, [employer] with church groups and [employer] groups.       Current Stressors: Cancer. And unable to get knee replacement last year 2/2 Covid pandemic and this year 2/2 Cancer. Hair is starting to fall out 2/2 chemo      Religious / Personal Beliefs: Episcopalian, Christian. I believe in God and [place] and the [place].       [name], BSN, RN-BC       Social Drivers of Health   Financial Resource Strain: Low Risk  (12/16/2022)   Overall Financial Resource Strain (CARDIA)    Difficulty of Paying Living Expenses: Not hard at all  Food Insecurity: No Food Insecurity (01/26/2024)   Hunger Vital Sign    Worried About Running Out of Food in the Last Year: Never true    Ran Out of Food in the Last Year: Never true  Transportation Needs: No Transportation Needs (01/26/2024)   PRAPARE -  Administrator, Civil Service (Medical): No    Lack of Transportation (Non-Medical): No  Physical Activity: Insufficiently Active (01/26/2024)   Exercise Vital Sign    Days of Exercise per Week: 2 days    Minutes of Exercise per Session: 30 min  Stress: No Stress Concern Present (01/26/2024)   [employer] of Occupational Health - Occupational Stress Questionnaire    Feeling of Stress: Not  at all  Social Connections: Moderately Integrated (01/26/2024)   Social Connection and Isolation Panel    Frequency of Communication with Friends and Family: More than three times a week    Frequency of Social Gatherings with Friends and Family: More than three times a week    Attends Religious Services: More than 4 times per year    Active Member of [employer] or Organizations: Yes    Attends Banker Meetings: More than 4 times per year    Marital Status: Widowed   Family History  Problem Relation Age of Onset   Cancer Mother        H&N, smoker   Liver disease Mother    Cancer Sister        lung, 2011, smoker   Other Other        grandmother had mastectomy in her 53's unknown reason   Macular degeneration Father    Diverticulitis Father    Breast cancer Neg Hx     ASSESSMENT Recent Results: The most recent result is correlated with 18 mg per week: Lab Results  Component Value Date   INR 2.0 02/14/2024   INR 2.9 01/17/2024   INR 2.1 12/20/2023    Anticoagulation Dosing: Description   Take one-and-one-half (1 & 1/2) tablets of your 2 mg lavender colored warfarin tablets by mouth, once-daily, on all days of the week, EXCEPT on FRIDAYS, take ONLY ONE (1) tablet on Fridays.      INR today: Therapeutic  PLAN Weekly dose was increased by 11% to 20 mg per week  Patient Instructions  Patient instructed to take medications as defined in the Anti-coagulation Track section of this encounter.  Patient instructed to take today's dose.  Patient instructed to take  one-and-one-half (1 & 1/2) tablets of your 2 mg lavender colored warfarin tablets by mouth, once-daily, on all days of the week, EXCEPT on FRIDAYS, take ONLY ONE (1) tablet on Fridays.  Patient verbalized understanding of these instructions.  Patient advised to contact clinic or seek medical attention if signs/symptoms of bleeding or thromboembolism occur.  Patient verbalized understanding by repeating back information and was advised to contact me if further medication-related questions arise. Patient was also provided an information handout.  Follow-up Return in 5 weeks (on 03/20/2024) for Follow up INR.  Lynwood KATHEE Lites, PharmD, CPP Clinical Pharmacist Practitioner  15 minutes spent face-to-face with the patient during the encounter. 50% of time spent on education, including signs/sx bleeding and clotting, as well as food and drug interactions with warfarin. 50% of time was spent on fingerprick POC INR sample collection,processing, results determination, and documentation in [email or web address].

## 2024-02-14 NOTE — Patient Instructions (Signed)
 Patient instructed to take medications as defined in the Anti-coagulation Track section of this encounter.  Patient instructed to take today's dose.  Patient instructed to take one-and-one-half (1 & 1/2) tablets of your 2 mg lavender colored warfarin tablets by mouth, once-daily, on all days of the week, EXCEPT on FRIDAYS, take ONLY ONE (1) tablet on Fridays.  Patient verbalized understanding of these instructions.

## 2024-02-28 ENCOUNTER — Other Ambulatory Visit (HOSPITAL_COMMUNITY): Payer: Self-pay | Admitting: Urology

## 2024-02-28 ENCOUNTER — Ambulatory Visit (HOSPITAL_COMMUNITY)
Admission: RE | Admit: 2024-02-28 | Discharge: 2024-02-28 | Disposition: A | Discharge status: Home / Self Care | Source: Ambulatory Visit | Attending: Urology | Admitting: Urology

## 2024-02-28 DIAGNOSIS — C674 Malignant neoplasm of posterior wall of bladder: Secondary | ICD-10-CM

## 2024-02-28 DIAGNOSIS — Z936 Other artificial openings of urinary tract status: Secondary | ICD-10-CM

## 2024-03-20 ENCOUNTER — Ambulatory Visit: Admitting: Pharmacist

## 2024-03-20 DIAGNOSIS — Z86718 Personal history of other venous thrombosis and embolism: Secondary | ICD-10-CM | POA: Diagnosis not present

## 2024-03-20 DIAGNOSIS — Z7901 Long term (current) use of anticoagulants: Secondary | ICD-10-CM

## 2024-03-20 DIAGNOSIS — D682 Hereditary deficiency of other clotting factors: Secondary | ICD-10-CM

## 2024-03-20 DIAGNOSIS — D6869 Other thrombophilia: Secondary | ICD-10-CM

## 2024-03-20 LAB — POCT INR: INR: 2.4 (ref 2.0–3.0)

## 2024-03-20 NOTE — Progress Notes (Signed)
 Anticoagulation Management Pamela [name] is a 78 y.o. female who reports to the clinic for monitoring of warfarin treatment.    Indication: Secondary hypercoagulable state, Factor II Deficiency, History of DVT with recurrence(s), Long term current use of oral anticoagulation with warfarin, target INR 2.0 - 3.0..  Duration: indefinite Supervising physician: Mliss LABOR. Trudy, MD  Anticoagulation Clinic Visit History: Patient does not report signs/symptoms of bleeding or thromboembolism  Other recent changes: No diet, medications, lifestyle changes cited by the patient at this visit.  Anticoagulation Episode Summary     Current INR goal:  2.0-3.0  TTR:  61.5% (13.2 y)  Next INR check:  04/17/2024  INR from last check:  2.4 (03/20/2024)  Weekly max warfarin dose:  --  Target end date:  Indefinite  INR check location:  Anticoagulation Clinic  Preferred lab:  --  Send INR reminders to:  [facility]   Indications   Secondary hypercoagulable state (HCC); hx of recurrent DVTs [D68.69] DVT HX OF (Resolved) [Z86.718] Long term (current) use of anticoagulants [Z79.01]        Comments:  --        Anticoagulation Care Providers     Provider Role Specialty Phone number   Otho Darnelle BRAVO, MD  Internal Medicine [phone]       Allergies  Allergen Reactions   Ace Inhibitors Cough    Current Outpatient Medications:    CALCIUM PO, Take by mouth., Disp: , Rfl:    Cholecalciferol (VITAMIN D -3) 25 MCG (1000 UT) CAPS, Take 1 capsule by mouth daily., Disp: , Rfl:    colchicine  0.6 MG tablet, Take 1 tablet (0.6 mg total) by mouth 2 (two) times daily for gout flare-ups., Disp: 20 tablet, Rfl: 1   denosumab  (PROLIA ) 60 MG/ML SOSY injection, Inject 60 mg into the skin every 6 (six) months., Disp: 1 mL, Rfl: 3   levothyroxine  (SYNTHROID ) 88 MCG tablet, Take 1 tablet (88 mcg total) by mouth daily before breakfast., Disp: 90 tablet, Rfl: 3   Magnesium  250 MG TABS, Take 1 tablet by  mouth daily., Disp: , Rfl:    Multiple Vitamins-Minerals (ALGAE BASED CALCIUM) TABS, Take 500 mg by mouth daily., Disp: , Rfl:    pantoprazole  (PROTONIX ) 20 MG tablet, Take 1 tablet (20 mg total) by mouth daily., Disp: 90 tablet, Rfl: 3   warfarin (COUMADIN ) 2 MG tablet, Take one-and-one-half (1 & 1/2) tablets on all days of the week, EXCEPT on TUESDAYS and THURSDAYS. Take ONLY ONE (1) tablet on Tuesdays and Thursdays., Disp: 40 tablet, Rfl: 3 Past Medical History:  Diagnosis Date   Age-related nuclear cataract, bilateral 07/14/2018   Anemia    Anemia requiring transfusions 06/03/2006   Required post operative transfusion 01/2020. Mixed anemia with hx of macrocytic B12 deficiency anemia.    Arthritis    Bile acid malabsorption syndrome / Diarrhea, resolving over the years. 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 [name] is following.   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    Employer: [employer]    Comment: Retired in 2011  Tobacco Use   Smoking  status: Former    Current packs/day: 0.00    Average packs/day: 1 pack/day for 20.0 years (20.0 ttl pk-yrs)    Types: Cigarettes    Start date: 07/06/1965    Quit date: 07/06/1985    Years since quitting: 38.7   Smokeless tobacco: Never   Tobacco comments:    Quit 1988  Vaping Use   Vaping status: Never Used  Substance and Sexual Activity   Alcohol use: Yes    Comment: 2 glasses of wine daily    Drug use: No   Sexual activity: Not on file  Other Topics Concern   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       Current Social History 10/18/2019        Patient lives alone in a 2 level home where laundry is in the basement. There is one step without handrail up to the entrance patient uses.      Patient's method of transportation is personal car.      The highest level of education was advanced degree; Master's in Education.      The patient currently retired.      Identified important Relationships are My daughter, good friend, Devere who is starting with dementia. Several women from church and [employer].      Pets : None       Interests / Fun: Read a lot, take walks, [employer] with church groups and [employer] groups.       Current Stressors: Cancer. And unable to get knee replacement last year 2/2 Covid pandemic and this year 2/2 Cancer. Hair is starting to fall out 2/2 chemo      Religious / Personal Beliefs: Episcopalian, Christian. I believe in God and [place] and the [place].       [name], BSN, RN-BC       Social Drivers of Health   Financial Resource Strain: Low Risk  (12/16/2022)   Overall Financial Resource Strain (CARDIA)    Difficulty of Paying Living Expenses: Not hard at all  Food Insecurity: No Food Insecurity (01/26/2024)   Hunger Vital Sign    Worried About Running Out of Food in the Last Year: Never true    Ran Out of Food in the Last Year: Never true  Transportation Needs: No  Transportation Needs (01/26/2024)   PRAPARE - Administrator, Civil Service (Medical): No    Lack of Transportation (Non-Medical): No  Physical Activity: Insufficiently Active (01/26/2024)   Exercise Vital Sign    Days of Exercise per Week: 2 days    Minutes of Exercise per Session: 30 min  Stress: No Stress Concern Present (01/26/2024)   [employer] of Occupational Health - Occupational Stress Questionnaire  Feeling of Stress: Not at all  Social Connections: Moderately Integrated (01/26/2024)   Social Connection and Isolation Panel    Frequency of Communication with Friends and Family: More than three times a week    Frequency of Social Gatherings with Friends and Family: More than three times a week    Attends Religious Services: More than 4 times per year    Active Member of [employer] or Organizations: Yes    Attends Banker Meetings: More than 4 times per year    Marital Status: Widowed   Family History  Problem Relation Age of Onset   Cancer Mother        H&N, smoker   Liver disease Mother    Cancer Sister        lung, 2011, smoker   Other Other        grandmother had mastectomy in her 87's unknown reason   Macular degeneration Father    Diverticulitis Father    Breast cancer Neg Hx     ASSESSMENT Recent Results: The most recent result is correlated with 20 mg warfarin per week: Lab Results  Component Value Date   INR 2.4 03/20/2024   INR 2.0 02/14/2024   INR 2.9 01/17/2024    Anticoagulation Dosing: Description   Take one-and-one-half (1 & 1/2) tablets of your 2 mg lavender colored warfarin tablets by mouth, once-daily, on all days of the week, EXCEPT on FRIDAYS, take ONLY ONE (1) tablet on Fridays.      INR today: Therapeutic  PLAN Weekly dose was unchanged. Continue to take one and one half (1& 1/2 ) of your 2 mg lavender colored warfarin tablets every day of the week, EXCEPT on FRIDAYS, take ONLY ONE (1) tablet.  Patient  Instructions  Patient instructed to take medications as defined in the Anti-coagulation Track section of this encounter.  Patient instructed to take today's dose.  Patient instructed to take one-and-one-half (1 & 1/2) tablets of your 2 mg lavender colored warfarin tablets by mouth, once-daily, on all days of the week, EXCEPT on FRIDAYS, take ONLY ONE (1) tablet on Fridays. Patient verbalized understanding of these instructions.  Patient advised to contact clinic or seek medical attention if signs/symptoms of bleeding or thromboembolism occur.  Patient verbalized understanding by repeating back information and was advised to contact me if further medication-related questions arise. Patient was also provided an information handout.  Follow-up Return in 4 weeks (on 04/17/2024) for Follow up INR.  Lynwood KATHEE Lites, PharmD, CPP Clinical Pharmacist Practitioner  15 minutes spent face-to-face with the patient during the encounter. 50% of time spent on education, including signs/sx bleeding and clotting, as well as food and drug interactions with warfarin. 50% of time was spent on fingerprick POC INR sample collection,processing, results determination, and documentation in [email or web address].

## 2024-03-20 NOTE — Progress Notes (Signed)
 INTERNAL MEDICINE TEACHING ATTENDING ADDENDUM   I agree with these recommendations regarding anticoagulation management.   Charissa Bash, MD

## 2024-03-20 NOTE — Patient Instructions (Signed)
 Patient instructed to take medications as defined in the Anti-coagulation Track section of this encounter.  Patient instructed to take today's dose.  Patient instructed to take one-and-one-half (1 & 1/2) tablets of your 2 mg lavender colored warfarin tablets by mouth, once-daily, on all days of the week, EXCEPT on FRIDAYS, take ONLY ONE (1) tablet on Fridays.  Patient verbalized understanding of these instructions.

## 2024-03-22 NOTE — Progress Notes (Signed)
 [facility] OFFICE PROGRESS NOTE  Pamela Mliss Dragon, MD [address] [place], [address] [place] [address]  DIAGNOSIS: 78 year old with bladder cancer diagnosed in 2021. 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  2) Neoadjuvant chemotherapy with gemcitabine  and cisplatin  started on March 24th 2021. She completed 3 cycles of therapy on Nov 14, 2019.  3) She is status post robotic assisted laparoscopic cystectomy and lymphadenectomy completed on January 17, 2020. She had a complete response to therapy with no residual disease on surgery.   CURRENT THERAPY: Observation   INTERVAL HISTORY: Pamela [name] 78 y.o. female returns to the clinic today for a follow up visit. She was previously followed by Dr. Amadeo, who has since left the practice. She established care with Dr. Sherrod and myself last year. She was last seen by us  one year ago. She returns to the clinic for a follow up visit. The patient is feeling well today without any concerning complaints. She follows with Dr. Alvaro from urology. Their most recent appointment was on 02/29/24 which did not show any evidence of recurrent or metastatic disease. Denies any fever, chills, night sweats, or recent weight loss. Denies any chest pain, shortness of breath, cough, or hemoptysis. Denies any nausea, vomiting, or constipation. She does get diarrhea periodically about 1x per day. She used to take colestipol  but does not take it as she is able to manage it. Denies any headache or visual changes. Denies any rashes or skin changes. Denies dysuria, malodorous urine, abdominal pain, flank pain, or hematuria. The patient is here today for evaluation and repeat blood work.     MEDICAL HISTORY: Past Medical History:  Diagnosis Date   Age-related nuclear cataract,  bilateral 07/14/2018   Anemia    Anemia requiring transfusions 06/03/2006   Required post operative transfusion 01/2020. Mixed anemia with hx of macrocytic B12 deficiency anemia.    Arthritis    Bile acid malabsorption syndrome / Diarrhea, resolving over the years. 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 [facility] with [name] is following.   PONV (postoperative nausea and vomiting)    S/P total knee arthroplasty, right 12/10/2020   Urothelial cancer (HCC) dx'd 09/05/2019    ALLERGIES:  is allergic to ace inhibitors.  MEDICATIONS:  Current Outpatient Medications  Medication Sig Dispense Refill   CALCIUM PO Take by mouth.     Cholecalciferol (VITAMIN D -3) 25 MCG (1000 UT) CAPS Take 1 capsule by mouth daily.     colchicine  0.6 MG tablet Take 1 tablet (0.6 mg total) by mouth 2 (two) times daily for gout flare-ups. 20 tablet 1   denosumab  (PROLIA ) 60 MG/ML SOSY injection Inject 60 mg into the  skin every 6 (six) months. 1 mL 3   levothyroxine  (SYNTHROID ) 88 MCG tablet Take 1 tablet (88 mcg total) by mouth daily before breakfast. 90 tablet 3   Magnesium  250 MG TABS Take 1 tablet by mouth daily.     Multiple Vitamins-Minerals (ALGAE BASED CALCIUM) TABS Take 500 mg by mouth daily.     pantoprazole  (PROTONIX ) 20 MG tablet Take 1 tablet (20 mg total) by mouth daily. 90 tablet 3   warfarin (COUMADIN ) 2 MG tablet Take one-and-one-half (1 &  1/2) tablets on all days of the week, EXCEPT on TUESDAYS and THURSDAYS. Take ONLY ONE (1) tablet on Tuesdays and Thursdays. 40 tablet 3   No current facility-administered medications for this visit.    SURGICAL HISTORY:  Past Surgical History:  Procedure Laterality Date   ANKLE ARTHROPLASTY Right 2008   BILIARY DILATION  05/13/2023   Procedure: BILIARY DILATION;  Surgeon: Rosalie Kitchens, MD;  Location: [facility];  Service: Gastroenterology;;   CHOLECYSTECTOMY     2004   CYSTOSCOPY WITH INJECTION N/A 01/17/2020   Procedure: CYSTOSCOPY WITH INJECTION;  Surgeon: Alvaro Hummer, MD;  Location: [facility];  Service: Urology;  Laterality: N/A;   ERCP Bilateral 05/13/2023   Procedure: ENDOSCOPIC RETROGRADE CHOLANGIOPANCREATOGRAPHY (ERCP);  Surgeon: Rosalie Kitchens, MD;  Location: [facility];  Service: Gastroenterology;  Laterality: Bilateral;   HAND CONTRACTURE RELEASE Left 1993   IR IMAGING GUIDED PORT INSERTION  09/20/2019   IR REMOVAL TUN ACCESS W/ PORT W/O FL MOD SED  09/06/2020   KNEE ARTHROSCOPY Right 2004   OVARY SURGERY Right 1998   REMOVAL OF STONES  05/13/2023   Procedure: REMOVAL OF STONES;  Surgeon: Rosalie Kitchens, MD;  Location: [facility];  Service: Gastroenterology;;   ROBOT ASSISTED LAPAROSCOPIC COMPLETE CYSTECT ILEAL CONDUIT N/A 01/17/2020   Procedure: XI ROBOTIC ASSISTED LAPAROSCOPIC COMPLETE CYSTECT ILEAL CONDUIT, XI ROBOTIC ASSISTED LAPAPRSCOPIC HYSTERECTOMY, LEFT OOPHERECTOMY,SALPINGECTOMY;LYMPHADENECTOMY;  Surgeon: Alvaro Hummer, MD;  Location: [facility];  Service: Urology;  Laterality: N/A;  6 HRS   SPHINCTEROTOMY  05/13/2023   Procedure: SPHINCTEROTOMY;  Surgeon: Rosalie Kitchens, MD;  Location: [facility];  Service: Gastroenterology;;   TONSILLECTOMY     TOTAL KNEE ARTHROPLASTY Right 12/10/2020   Procedure: TOTAL KNEE ARTHROPLASTY;  Surgeon: Beverley Evalene BIRCH, MD;  Location: [facility];  Service: Orthopedics;  Laterality: Right;   TRANSURETHRAL RESECTION OF BLADDER TUMOR WITH MITOMYCIN -C N/A  08/28/2019   Procedure: TRANSURETHRAL RESECTION OF BLADDER TUMOR;  Surgeon: Carolee Sherwood BIRCH DOUGLAS, MD;  Location: [facility];  Service: Urology;  Laterality: N/A;   wisdom teeth extractcion       REVIEW OF SYSTEMS:   Review of Systems  Constitutional: Negative for appetite change, chills, fatigue, fever and unexpected weight change.  HENT:   Negative for mouth sores, nosebleeds, sore throat and trouble swallowing.   Eyes: Negative for eye problems and icterus.  Respiratory: Negative for cough, hemoptysis, shortness of breath and wheezing.   Cardiovascular: Negative for chest pain and leg swelling.  Gastrointestinal: Positive for intermittent diarrhea. Negative for abdominal pain, constipation, nausea and vomiting.  Genitourinary: Negative for bladder incontinence, difficulty urinating, dysuria, frequency and hematuria.   Musculoskeletal: Negative for back pain, gait problem, neck pain and neck stiffness.  Skin: Negative for itching and rash.  Neurological: Negative for dizziness, extremity weakness, gait problem, headaches, light-headedness and seizures.  Hematological: Negative for adenopathy. Does not bruise/bleed easily.  Psychiatric/Behavioral: Negative for confusion, depression and sleep disturbance. The patient is not nervous/anxious.     PHYSICAL EXAMINATION:  There were no vitals taken for this visit.  ECOG PERFORMANCE STATUS: 1  Physical Exam  Constitutional: Oriented to person, place, and time and well-developed, well-nourished, and in no distress.  HENT:  Head: Normocephalic and atraumatic.  Mouth/Throat: Oropharynx is clear and moist. No oropharyngeal exudate.  Eyes: Conjunctivae are normal. Right eye exhibits no discharge. Left eye exhibits no discharge. No scleral icterus.  Neck: Normal range of motion. Neck supple.  Cardiovascular: Normal rate, regular rhythm, normal heart sounds and intact distal pulses.   Pulmonary/Chest: Effort normal and breath sounds normal. No  respiratory distress. No wheezes. No rales.  Abdominal: Soft. Bowel sounds are normal. Exhibits no distension and no mass. There is no tenderness.  Musculoskeletal: Normal range of motion. Exhibits no edema.  Lymphadenopathy:    No cervical adenopathy.  Neurological: Alert and oriented to person, place, and time. Exhibits normal muscle tone. Gait normal. Coordination normal.  Skin: Skin is warm and dry. No rash noted. Not diaphoretic. No erythema. No pallor.  Psychiatric: Mood, memory and judgment normal.  Vitals reviewed.  LABORATORY DATA: Lab Results  Component Value Date   WBC 5.5 03/29/2023   HGB 12.5 03/29/2023   HCT 38.0 03/29/2023   MCV 100.8 (H) 03/29/2023   PLT 243 03/29/2023      Chemistry      Component Value Date/Time   NA 136 03/29/2023 1453   NA 140 12/31/2022 1106   K 5.1 03/29/2023 1453   CL 102 03/29/2023 1453   CO2 28 03/29/2023 1453   BUN 21 03/29/2023 1453   BUN 22 12/31/2022 1106   CREATININE 1.24 (H) 03/29/2023 1453   CREATININE 1.23 (H) 08/27/2021 1007   CREATININE 0.78 12/13/2014 0954      Component Value Date/Time   CALCIUM 9.6 03/29/2023 1453   ALKPHOS 50 03/29/2023 1453   AST 17 03/29/2023 1453   AST 17 08/27/2021 1007   ALT 9 03/29/2023 1453   ALT 10 08/27/2021 1007   BILITOT 0.7 03/29/2023 1453   BILITOT 0.8 08/27/2021 1007       RADIOGRAPHIC STUDIES:  DG Chest 2 View Result Date: 03/03/2024 CLINICAL DATA:  Follow-up.  History of bladder cancer. EXAM: CHEST - 2 VIEW COMPARISON:  Chest radiograph 02/16/2023 FINDINGS: The heart size and mediastinal contours are within normal limits. Both lungs are clear. The visualized skeletal structures are unremarkable. IMPRESSION: No active cardiopulmonary disease. Electronically Signed   By: Bard Moats M.D.   On: 03/03/2024 16:36     ASSESSMENT/PLAN:  This is a very pleasant 78 year old female with a history of  Bladder cancer diagnosed in 2021.  She was found to have T2N0 urothelial carcinoma  with small cell features and achieved a complete response to chemotherapy and radical cystectomy.    She is here to establish care. She was previously followed by Dr. Amadeo.    Labs were reviewed. She recently had restaging CT in August 2025 which did not show disease progression.    We will see her back for labs and follow up in 1 year. She will also see Dr. Alvaro with CT scan next year.   The patient was advised to call immediately if she has any concerning symptoms in the interval. The patient voices understanding of current disease status and treatment options and is in agreement with the current care plan. All questions were answered. The patient knows to call the clinic with any problems, questions or concerns. We can certainly see the patient much sooner if  necessary     No orders of the defined types were placed in this encounter.    The total time spent in the appointment was 20-29 minutes  Marena Witts L Trevious Rampey, PA-C 03/22/24

## 2024-03-23 ENCOUNTER — Other Ambulatory Visit (HOSPITAL_COMMUNITY): Payer: Self-pay

## 2024-03-27 ENCOUNTER — Other Ambulatory Visit: Payer: Self-pay | Admitting: Physician Assistant

## 2024-03-27 ENCOUNTER — Inpatient Hospital Stay: Payer: Medicare Other | Attending: Physician Assistant

## 2024-03-27 ENCOUNTER — Inpatient Hospital Stay (HOSPITAL_BASED_OUTPATIENT_CLINIC_OR_DEPARTMENT_OTHER): Payer: Medicare Other | Admitting: Physician Assistant

## 2024-03-27 VITALS — BP 148/69 | HR 82 | Temp 97.7°F | Resp 15 | Wt 177.7 lb

## 2024-03-27 DIAGNOSIS — C689 Malignant neoplasm of urinary organ, unspecified: Secondary | ICD-10-CM | POA: Diagnosis not present

## 2024-03-27 DIAGNOSIS — C679 Malignant neoplasm of bladder, unspecified: Secondary | ICD-10-CM | POA: Insufficient documentation

## 2024-03-27 LAB — CMP (CANCER CENTER ONLY)
ALT: 10 U/L (ref 0–44)
AST: 18 U/L (ref 15–41)
Albumin: 4.2 g/dL (ref 3.5–5.0)
Alkaline Phosphatase: 48 U/L (ref 38–126)
Anion gap: 8 (ref 5–15)
BUN: 17 mg/dL (ref 8–23)
CO2: 28 mmol/L (ref 22–32)
Calcium: 9.9 mg/dL (ref 8.9–10.3)
Chloride: 103 mmol/L (ref 98–111)
Creatinine: 1.24 mg/dL — ABNORMAL HIGH (ref 0.44–1.00)
GFR, Estimated: 44 mL/min — ABNORMAL LOW (ref >60)
Glucose, Bld: 96 mg/dL (ref 70–99)
Potassium: 4.6 mmol/L (ref 3.5–5.1)
Sodium: 139 mmol/L (ref 135–145)
Total Bilirubin: 0.9 mg/dL (ref 0.0–1.2)
Total Protein: 7.3 g/dL (ref 6.5–8.1)

## 2024-03-27 LAB — CBC WITH DIFFERENTIAL (CANCER CENTER ONLY)
Abs Immature Granulocytes: 0.03 K/uL (ref 0.00–0.07)
Basophils Absolute: 0 K/uL (ref 0.0–0.1)
Basophils Relative: 1 %
Eosinophils Absolute: 0.1 K/uL (ref 0.0–0.5)
Eosinophils Relative: 1 %
HCT: 38.7 % (ref 36.0–46.0)
Hemoglobin: 12.8 g/dL (ref 12.0–15.0)
Immature Granulocytes: 1 %
Lymphocytes Relative: 23 %
Lymphs Abs: 1.5 K/uL (ref 0.7–4.0)
MCH: 33.7 pg (ref 26.0–34.0)
MCHC: 33.1 g/dL (ref 30.0–36.0)
MCV: 101.8 fL — ABNORMAL HIGH (ref 80.0–100.0)
Monocytes Absolute: 0.5 K/uL (ref 0.1–1.0)
Monocytes Relative: 8 %
Neutro Abs: 4.3 K/uL (ref 1.7–7.7)
Neutrophils Relative %: 66 %
Platelet Count: 230 K/uL (ref 150–400)
RBC: 3.8 MIL/uL — ABNORMAL LOW (ref 3.87–5.11)
RDW: 12.4 % (ref 11.5–15.5)
WBC Count: 6.5 K/uL (ref 4.0–10.5)
nRBC: 0 % (ref 0.0–0.2)

## 2024-03-28 ENCOUNTER — Telehealth: Payer: Self-pay | Admitting: Internal Medicine

## 2024-03-28 NOTE — Telephone Encounter (Signed)
 Scheduled appointments with the patient per LOS notes.

## 2024-04-08 ENCOUNTER — Other Ambulatory Visit: Payer: Self-pay

## 2024-04-12 ENCOUNTER — Other Ambulatory Visit (HOSPITAL_COMMUNITY): Payer: Self-pay

## 2024-04-12 ENCOUNTER — Other Ambulatory Visit: Payer: Self-pay

## 2024-04-12 DIAGNOSIS — E039 Hypothyroidism, unspecified: Secondary | ICD-10-CM

## 2024-04-12 MED ORDER — LEVOTHYROXINE SODIUM 88 MCG PO TABS
88.0000 ug | ORAL_TABLET | Freq: Every day | ORAL | 3 refills | Status: AC
  Filled 2024-04-12: qty 90, 90d supply, fill #0
  Filled 2024-07-10: qty 90, 90d supply, fill #1

## 2024-04-12 NOTE — Telephone Encounter (Signed)
 Medication sent to pharmacy

## 2024-04-17 ENCOUNTER — Other Ambulatory Visit (HOSPITAL_COMMUNITY): Payer: Self-pay

## 2024-04-17 ENCOUNTER — Ambulatory Visit: Admitting: Pharmacist

## 2024-04-17 DIAGNOSIS — Z23 Encounter for immunization: Secondary | ICD-10-CM

## 2024-04-17 DIAGNOSIS — Z7901 Long term (current) use of anticoagulants: Secondary | ICD-10-CM

## 2024-04-17 DIAGNOSIS — D6869 Other thrombophilia: Secondary | ICD-10-CM | POA: Diagnosis not present

## 2024-04-17 LAB — POCT INR: INR: 3.5 — AB (ref 2.0–3.0)

## 2024-04-17 MED ORDER — WARFARIN SODIUM 2 MG PO TABS
ORAL_TABLET | ORAL | 3 refills | Status: AC
  Filled 2024-04-17: qty 40, 30d supply, fill #0
  Filled 2024-05-17: qty 40, 30d supply, fill #1
  Filled 2024-06-16: qty 40, 30d supply, fill #2
  Filled 2024-07-13: qty 40, 30d supply, fill #3

## 2024-04-17 NOTE — Addendum Note (Signed)
 Addended by: Ethell Blatchford B on: 04/17/2024 03:47 PM   Modules accepted: Orders

## 2024-04-17 NOTE — Patient Instructions (Signed)
 Patient instructed to take medications as defined in the Anti-coagulation Track section of this encounter.  Patient instructed to take today's dose.  Patient instructed to take one-and-one-half (1 & 1/2) of your 2 mg strength, lavender warfarin tablets on all days of the week, EXCEPT on TUESDAYS and FRIDAYS, take only one (1) tablet.  Patient verbalized understanding of these instructions.

## 2024-04-17 NOTE — Progress Notes (Signed)
 Anticoagulation Management Pamela [name] is a 78 y.o. female who reports to the clinic for monitoring of warfarin treatment.    Indication: Secondary hypercoagulable state; history of DVT with recurrence(s); long term, current use of oral anticoagulation with warfarin, target INR 2.0 - 3.0.   Duration: indefinite Supervising physician: Mliss Foot, MD  Anticoagulation Clinic Visit History: Patient does not report signs/symptoms of bleeding or thromboembolism  Other recent changes: No diet, medications, lifestyle changes except as noted in patient findings.  Anticoagulation Episode Summary     Current INR goal:  2.0-3.0  TTR:  61.4% (13.3 y)  Next INR check:  05/15/2024  INR from last check:  3.5 (04/17/2024)  Weekly max warfarin dose:  --  Target end date:  Indefinite  INR check location:  Anticoagulation Clinic  Preferred lab:  --  Send INR reminders to:  [facility]   Indications   Secondary hypercoagulable state (HCC); hx of recurrent DVTs [D68.69] DVT HX OF (Resolved) [Z86.718] Long term (current) use of anticoagulants [Z79.01]        Comments:  --        Anticoagulation Care Providers     Provider Role Specialty Phone number   Otho Darnelle BRAVO, MD  Internal Medicine [phone]       Allergies  Allergen Reactions   Ace Inhibitors Cough    Current Outpatient Medications:    Cholecalciferol (VITAMIN D -3) 25 MCG (1000 UT) CAPS, Take 1 capsule by mouth daily., Disp: , Rfl:    colchicine  0.6 MG tablet, Take 1 tablet (0.6 mg total) by mouth 2 (two) times daily for gout flare-ups. (Patient taking differently: Take 0.6 mg by mouth as needed.), Disp: 20 tablet, Rfl: 1   denosumab  (PROLIA ) 60 MG/ML SOSY injection, Inject 60 mg into the skin every 6 (six) months., Disp: 1 mL, Rfl: 3   levothyroxine  (SYNTHROID ) 88 MCG tablet, Take 1 tablet (88 mcg total) by mouth daily before breakfast., Disp: 90 tablet, Rfl: 3   Magnesium  250 MG TABS, Take 1 tablet by mouth  daily., Disp: , Rfl:    pantoprazole  (PROTONIX ) 20 MG tablet, Take 1 tablet (20 mg total) by mouth daily., Disp: 90 tablet, Rfl: 3   warfarin (COUMADIN ) 2 MG tablet, Take one-and-one-half (1 & 1/2) tablets on all days of the week, EXCEPT on TUESDAYS and THURSDAYS. Take ONLY ONE (1) tablet on Tuesdays and Thursdays., Disp: 40 tablet, Rfl: 3 Past Medical History:  Diagnosis Date   Age-related nuclear cataract, bilateral 07/14/2018   Anemia    Anemia requiring transfusions 06/03/2006   Required post operative transfusion 01/2020. Mixed anemia with hx of macrocytic B12 deficiency anemia.    Arthritis    Bile acid malabsorption syndrome / Diarrhea, resolving over the years. 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 [facility] with [name] is following.   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    Employer: [employer]    Comment: Retired in 2011  Tobacco Use   Smoking status: Former    Current packs/day: 0.00    Average packs/day: 1 pack/day for 20.0 years (20.0 ttl pk-yrs)     Types: Cigarettes    Start date: 07/06/1965    Quit date: 07/06/1985    Years since quitting: 38.8   Smokeless tobacco: Never   Tobacco comments:    Quit 1988  Vaping Use   Vaping status: Never Used  Substance and Sexual Activity   Alcohol use: Yes    Comment: 2 glasses of wine daily    Drug use: No   Sexual activity: Not on file  Other Topics Concern   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       Current Social History 10/18/2019        Patient lives alone in a 2 level home where laundry is in the basement. There is one step without handrail up to the entrance patient uses.      Patient's method of transportation is personal car.      The highest level of education was advanced degree; Master's in Education.      The patient currently retired.      Identified important Relationships are My daughter, good friend, Devere who is starting with dementia. Several women from church and [employer].      Pets : None       Interests / Fun: Read a lot, take walks, [employer] with church groups and [employer] groups.       Current Stressors: Cancer. And unable to get knee replacement last year 2/2 Covid pandemic and this year 2/2 Cancer. Hair is starting to fall out 2/2 chemo      Religious / Personal Beliefs: Episcopalian, Christian. I believe in God and [place] and the [place].       [name], BSN, RN-BC       Social Drivers of Health   Financial Resource Strain: Low Risk  (12/16/2022)   Overall Financial Resource Strain (CARDIA)    Difficulty of Paying Living Expenses: Not hard at all  Food Insecurity: No Food Insecurity (01/26/2024)   Hunger Vital Sign    Worried About Running Out of Food in the Last Year: Never true    Ran Out of Food in the Last Year: Never true  Transportation Needs: No Transportation Needs (01/26/2024)   PRAPARE - Administrator, Civil Service (Medical): No    Lack of  Transportation (Non-Medical): No  Physical Activity: Insufficiently Active (01/26/2024)   Exercise Vital Sign    Days of Exercise per Week: 2 days    Minutes of Exercise per Session: 30 min  Stress: No Stress Concern Present (01/26/2024)   [employer] of Occupational Health - Occupational Stress Questionnaire    Feeling of Stress: Not at all  Social Connections: Moderately Integrated (01/26/2024)   Social Connection and Isolation Panel    Frequency of  Communication with Friends and Family: More than three times a week    Frequency of Social Gatherings with Friends and Family: More than three times a week    Attends Religious Services: More than 4 times per year    Active Member of [employer] or Organizations: Yes    Attends Banker Meetings: More than 4 times per year    Marital Status: Widowed   Family History  Problem Relation Age of Onset   Cancer Mother        H&N, smoker   Liver disease Mother    Cancer Sister        lung, 2011, smoker   Other Other        grandmother had mastectomy in her 59's unknown reason   Macular degeneration Father    Diverticulitis Father    Breast cancer Neg Hx     ASSESSMENT Recent Results: The most recent result is correlated with 20 mg  warfarin per week: Lab Results  Component Value Date   INR 3.5 (A) 04/17/2024   INR 2.4 03/20/2024   INR 2.0 02/14/2024    Anticoagulation Dosing: Description   Take one-and-one-half (1 & 1/2) tablets of your 2 mg lavender colored warfarin tablets by mouth, once-daily, on all days of the week, EXCEPT on TUESDAYS and FRIDAYS, take ONLY ONE (1) tablet on Tuesdays and Fridays.      INR today: Supratherapeutic  PLAN Weekly dose was decreased by 5% to 19 mg warfarin per week  Patient Instructions  Patient instructed to take medications as defined in the Anti-coagulation Track section of this encounter.  Patient instructed to take today's dose.  Patient instructed to take one-and-one-half (1  & 1/2) of your 2 mg strength, lavender warfarin tablets on all days of the week, EXCEPT on TUESDAYS and FRIDAYS, take only one (1) tablet.  Patient verbalized understanding of these instructions.  Patient advised to contact clinic or seek medical attention if signs/symptoms of bleeding or thromboembolism occur.  Patient verbalized understanding by repeating back information and was advised to contact me if further medication-related questions arise. Patient was also provided an information handout.  Follow-up Return in 4 weeks (on 05/15/2024) for Follow up INR.  Lynwood KATHEE Lites, PharmD, CPP Clinical Pharmacist Practitioner  15 minutes spent face-to-face with the patient during the encounter. 50% of time spent on education, including signs/sx bleeding and clotting, as well as food and drug interactions with warfarin. 50% of time was spent on fingerprick POC INR sample collection,processing, results determination, and documentation in [email or web address].

## 2024-04-18 ENCOUNTER — Other Ambulatory Visit (HOSPITAL_COMMUNITY): Payer: Self-pay

## 2024-04-18 MED ORDER — COMIRNATY 30 MCG/0.3ML IM SUSY
0.3000 mL | PREFILLED_SYRINGE | Freq: Once | INTRAMUSCULAR | 0 refills | Status: AC
  Filled 2024-04-18: qty 0.3, 1d supply, fill #0

## 2024-04-18 NOTE — Progress Notes (Signed)
Evaluation and management procedures were performed by the Clinical Pharmacy Practitioner under my supervision and collaboration. I have reviewed the Practitioner's note and chart, and I agree with the management and plan as documented above. ° °

## 2024-05-15 ENCOUNTER — Ambulatory Visit: Admitting: Pharmacist

## 2024-05-15 DIAGNOSIS — D6869 Other thrombophilia: Secondary | ICD-10-CM

## 2024-05-15 DIAGNOSIS — Z7901 Long term (current) use of anticoagulants: Secondary | ICD-10-CM | POA: Diagnosis not present

## 2024-05-15 LAB — POCT INR: INR: 2.5 (ref 2.0–3.0)

## 2024-05-15 NOTE — Patient Instructions (Signed)
 Patient instructed to take medications as defined in the Anti-coagulation Track section of this encounter.  Patient instructed to take today's dose.  Patient instructed to take one-and-one-half (1 & 1/2) tablets of your 2 mg lavender colored warfarin tablets by mouth, once-daily, on all days of the week, EXCEPT on TUESDAYS and FRIDAYS, take ONLY ONE (1) tablet on Tuesdays and Fridays.  Patient verbalized understanding of these instructions.

## 2024-05-15 NOTE — Progress Notes (Signed)
 Anticoagulation Management Pamela [name] is a 78 y.o. female who reports to the clinic for monitoring of warfarin treatment.    Indication: DVT, History of multiple recurrences secondary to a secondary hypercoagulable state. INR target range 2.0 - 3.0.  Duration: indefinite Supervising physician: Mliss Foot, MD  Anticoagulation Clinic Visit History: Patient does not report signs/symptoms of bleeding or thromboembolism  Other recent changes: No diet, medications, lifestyle changes cited or identified.  Anticoagulation Episode Summary     Current INR goal:  2.0-3.0  TTR:  61.3% (13.4 y)  Next INR check:  06/12/2024  INR from last check:  2.5 (05/15/2024)  Weekly max warfarin dose:  --  Target end date:  Indefinite  INR check location:  Anticoagulation Clinic  Preferred lab:  --  Send INR reminders to:  [facility]   Indications   Secondary hypercoagulable state (HCC); hx of recurrent DVTs [D68.69] DVT HX OF (Resolved) [Z86.718] Long term (current) use of anticoagulants [Z79.01]        Comments:  --        Anticoagulation Care Providers     Provider Role Specialty Phone number   Otho Darnelle BRAVO, MD  Internal Medicine [phone]       Allergies  Allergen Reactions   Ace Inhibitors Cough    Current Outpatient Medications:    Cholecalciferol (VITAMIN D -3) 25 MCG (1000 UT) CAPS, Take 1 capsule by mouth daily., Disp: , Rfl:    colchicine  0.6 MG tablet, Take 1 tablet (0.6 mg total) by mouth 2 (two) times daily for gout flare-ups. (Patient taking differently: Take 0.6 mg by mouth as needed (With any gout flare.).), Disp: 20 tablet, Rfl: 1   denosumab  (PROLIA ) 60 MG/ML SOSY injection, Inject 60 mg into the skin every 6 (six) months., Disp: 1 mL, Rfl: 3   levothyroxine  (SYNTHROID ) 88 MCG tablet, Take 1 tablet (88 mcg total) by mouth daily before breakfast., Disp: 90 tablet, Rfl: 3   Magnesium  250 MG TABS, Take 1 tablet by mouth daily., Disp: , Rfl:     pantoprazole  (PROTONIX ) 20 MG tablet, Take 1 tablet (20 mg total) by mouth daily., Disp: 90 tablet, Rfl: 3   warfarin (COUMADIN ) 2 MG tablet, Take 1&1/2 tablets daily on all days of the week, EXCEPT on TUESDAYS and FRIDAYS. Take ONLY ONE (1) tablet on Tuesdays and Fridays., Disp: 40 tablet, Rfl: 3 Past Medical History:  Diagnosis Date   Age-related nuclear cataract, bilateral 07/14/2018   Anemia    Anemia requiring transfusions 06/03/2006   Required post operative transfusion 01/2020. Mixed anemia with hx of macrocytic B12 deficiency anemia.    Arthritis    Bile acid malabsorption syndrome / Diarrhea, resolving over the years. 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 [name] is following.   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    Employer: [employer]    Comment: Retired in 2011  Tobacco Use   Smoking status: Former    Current packs/day: 0.00    Average packs/day: 1 pack/day for 20.0 years (20.0 ttl pk-yrs)    Types: Cigarettes    Start date: 07/06/1965     Quit date: 07/06/1985    Years since quitting: 38.8   Smokeless tobacco: Never   Tobacco comments:    Quit 1988  Vaping Use   Vaping status: Never Used  Substance and Sexual Activity   Alcohol use: Yes    Comment: 2 glasses of wine daily    Drug use: No   Sexual activity: Not on file  Other Topics Concern   Not on file  Social History Narrative   Worked as designer, 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       Current Social History 10/18/2019        Patient lives alone in a 2 level home where laundry is in the basement. There is one step without handrail up to the entrance patient uses.      Patient's method of transportation is personal car.      The highest level of education was advanced degree; Master's in Education.      The patient currently retired.      Identified important Relationships are My daughter, good friend, Devere who is starting with dementia. Several women from church and [employer].      Pets : None       Interests / Fun: Read a lot, take walks, [employer] with church groups and [employer] groups.       Current Stressors: Cancer. And unable to get knee replacement last year 2/2 Covid pandemic and this year 2/2 Cancer. Hair is starting to fall out 2/2 chemo      Religious / Personal Beliefs: Episcopalian, Christian. I believe in God and Heaven and the [place].       [name], BSN, RN-BC       Social Drivers of Health   Financial Resource Strain: Low Risk  (12/16/2022)   Overall Financial Resource Strain (CARDIA)    Difficulty of Paying Living Expenses: Not hard at all  Food Insecurity: No Food Insecurity (01/26/2024)   Hunger Vital Sign    Worried About Running Out of Food in the Last Year: Never true    Ran Out of Food in the Last Year: Never true  Transportation Needs: No Transportation Needs (01/26/2024)   PRAPARE - Administrator, Civil Service (Medical): No    Lack of Transportation (Non-Medical): No  Physical  Activity: Insufficiently Active (01/26/2024)   Exercise Vital Sign    Days of Exercise per Week: 2 days    Minutes of Exercise per Session: 30 min  Stress: No Stress Concern Present (01/26/2024)   [employer] of Occupational Health - Occupational Stress Questionnaire    Feeling of Stress: Not at all  Social Connections: Moderately Integrated (01/26/2024)   Social Connection and Isolation Panel    Frequency of Communication with Friends and  Family: More than three times a week    Frequency of Social Gatherings with Friends and Family: More than three times a week    Attends Religious Services: More than 4 times per year    Active Member of Clubs or Organizations: Yes    Attends Banker Meetings: More than 4 times per year    Marital Status: Widowed   Family History  Problem Relation Age of Onset   Cancer Mother        H&N, smoker   Liver disease Mother    Cancer Sister        lung, 2011, smoker   Other Other        grandmother had mastectomy in her 5's unknown reason   Macular degeneration Father    Diverticulitis Father    Breast cancer Neg Hx     ASSESSMENT Recent Results: The most recent result is correlated with 19 mg warfarin per week: Lab Results  Component Value Date   INR 2.5 05/15/2024   INR 3.5 (A) 04/17/2024   INR 2.4 03/20/2024    Anticoagulation Dosing: Description   Take one-and-one-half (1 & 1/2) tablets of your 2 mg lavender colored warfarin tablets by mouth, once-daily, on all days of the week, EXCEPT on TUESDAYS and FRIDAYS, take ONLY ONE (1) tablet on Tuesdays and Fridays.      INR today: Therapeutic  PLAN Weekly dose was unchanged.  Patient Instructions  Patient instructed to take medications as defined in the Anti-coagulation Track section of this encounter.  Patient instructed to take today's dose.  Patient instructed to take one-and-one-half (1 & 1/2) tablets of your 2 mg lavender colored warfarin tablets by mouth,  once-daily, on all days of the week, EXCEPT on TUESDAYS and FRIDAYS, take ONLY ONE (1) tablet on Tuesdays and Fridays.  Patient verbalized understanding of these instructions.  Patient advised to contact clinic or seek medical attention if signs/symptoms of bleeding or thromboembolism occur.  Patient verbalized understanding by repeating back information and was advised to contact me if further medication-related questions arise. Patient was also provided an information handout.  Follow-up Return in about 4 weeks (around 06/12/2024) for Follow up INR.  Lynwood KATHEE Lites, PharmD, CPP Clinical Pharmacist Practitioner  15 minutes spent face-to-face with the patient during the encounter. 50% of time spent on education, including signs/sx bleeding and clotting, as well as food and drug interactions with warfarin. 50% of time was spent on fingerprick POC INR sample collection,processing, results determination, and documentation in [email or web address].

## 2024-05-24 ENCOUNTER — Other Ambulatory Visit: Payer: Self-pay

## 2024-05-24 ENCOUNTER — Other Ambulatory Visit: Payer: Self-pay | Admitting: Internal Medicine

## 2024-05-24 ENCOUNTER — Other Ambulatory Visit (HOSPITAL_COMMUNITY): Payer: Self-pay

## 2024-05-24 MED ORDER — PANTOPRAZOLE SODIUM 20 MG PO TBEC
20.0000 mg | DELAYED_RELEASE_TABLET | Freq: Every day | ORAL | 3 refills | Status: AC
  Filled 2024-05-24: qty 90, 90d supply, fill #0

## 2024-05-24 NOTE — Telephone Encounter (Signed)
 Medication sent to pharmacy

## 2024-06-12 ENCOUNTER — Ambulatory Visit: Admitting: Pharmacist

## 2024-06-12 DIAGNOSIS — Z7901 Long term (current) use of anticoagulants: Secondary | ICD-10-CM

## 2024-06-12 DIAGNOSIS — D6869 Other thrombophilia: Secondary | ICD-10-CM

## 2024-06-12 LAB — POCT INR: INR: 2 (ref 2.0–3.0)

## 2024-06-12 NOTE — Progress Notes (Signed)
Evaluation and management procedures were performed by the Clinical Pharmacy Practitioner under my supervision and collaboration. I have reviewed the Practitioner's note and chart, and I agree with the management and plan as documented above. ° °

## 2024-06-12 NOTE — Progress Notes (Signed)
 Pamela [name]

## 2024-06-12 NOTE — Patient Instructions (Signed)
 Patient instructed to take medications as defined in the Anti-coagulation Track section of this encounter.  Patient instructed to take today's dose.  Patient instructed to take one-and-one-half (1 & 1/2) tablets of your 2 mg lavender colored warfarin tablets by mouth, once-daily, on all days of the week. Patient verbalized understanding of these instructions.

## 2024-06-12 NOTE — Progress Notes (Signed)
 Anticoagulation Management Pamela [name] is a 78 y.o. female who reports to the clinic for monitoring of warfarin treatment.    Indication: Secondary hypercoagulable state; history of recurrence of VTE (DVTs); long term current use of oral anticoagulation with warfarin for target range INR of 2.0 - 3.0.  Duration: indefinite Supervising physician: Pamela Foot, MD  Anticoagulation Clinic Visit History: Patient does not report signs/symptoms of bleeding or thromboembolism  Other recent changes: No diet, medications, lifestyle changes cited by the patient or identified.  Anticoagulation Episode Summary     Current INR goal:  2.0-3.0  TTR:  61.6% (13.5 y)  Next INR check:  07/03/2024  INR from last check:  2.0 (06/12/2024)  Weekly max warfarin dose:  --  Target end date:  Indefinite  INR check location:  Anticoagulation Clinic  Preferred lab:  --  Send INR reminders to:  [facility]   Indications   Secondary hypercoagulable state (HCC); hx of recurrent DVTs [D68.69] DVT HX OF (Resolved) [Z86.718] Long term (current) use of anticoagulants [Z79.01]        Comments:  --        Anticoagulation Care Providers     Provider Role Specialty Phone number   Pamela Darnelle BRAVO, MD  Internal Medicine [phone]       Allergies  Allergen Reactions   Ace Inhibitors Cough    Current Outpatient Medications:    Cholecalciferol (VITAMIN D -3) 25 MCG (1000 UT) CAPS, Take 1 capsule by mouth daily., Disp: , Rfl:    denosumab  (PROLIA ) 60 MG/ML SOSY injection, Inject 60 mg into the skin every 6 (six) months., Disp: 1 mL, Rfl: 3   levothyroxine  (SYNTHROID ) 88 MCG tablet, Take 1 tablet (88 mcg total) by mouth daily before breakfast., Disp: 90 tablet, Rfl: 3   Magnesium  250 MG TABS, Take 1 tablet by mouth daily., Disp: , Rfl:    pantoprazole  (PROTONIX ) 20 MG tablet, Take 1 tablet (20 mg total) by mouth daily., Disp: 90 tablet, Rfl: 3   warfarin (COUMADIN ) 2 MG tablet, Take 1&1/2  tablets daily on all days of the week, EXCEPT on TUESDAYS and FRIDAYS. Take ONLY ONE (1) tablet on Tuesdays and Fridays., Disp: 40 tablet, Rfl: 3   colchicine  0.6 MG tablet, Take 1 tablet (0.6 mg total) by mouth 2 (two) times daily for gout flare-ups. (Patient not taking: Reported on 06/12/2024), Disp: 20 tablet, Rfl: 1 Past Medical History:  Diagnosis Date   Age-related nuclear cataract, bilateral 07/14/2018   Anemia    Anemia requiring transfusions 06/03/2006   Required post operative transfusion 01/2020. Mixed anemia with hx of macrocytic B12 deficiency anemia.    Arthritis    Bile acid malabsorption syndrome / Diarrhea, resolving over the years. 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 Beverley with [facility] [name] is following.   PONV (postoperative nausea and vomiting)  S/P total knee arthroplasty, right 12/10/2020   Urothelial cancer (HCC) dx'd 09/05/2019   Social History   Socioeconomic History   Marital status: Widowed    Spouse name: Not on file   Number of children: Not on file   Years of education: Not on file   Highest education level: Not on file  Occupational History   Occupation: Pamela [name]    Employer: Pamela [name]    Comment: Retired in 2011  Tobacco Use   Smoking status: Former    Current packs/day: 0.00    Average packs/day: 1 pack/day for 20.0 years (20.0 ttl pk-yrs)    Types: Cigarettes     Start date: 07/06/1965    Quit date: 07/06/1985    Years since quitting: 38.9   Smokeless tobacco: Never   Tobacco comments:    Quit 1988  Vaping Use   Vaping status: Never Used  Substance and Sexual Activity   Alcohol use: Yes    Comment: 2 glasses of wine daily    Drug use: No   Sexual activity: Not on file  Other Topics Concern   Not on file  Social History Narrative   Worked as designer, 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       Current Social History 10/18/2019        Patient lives alone in a 2 level home where laundry is in the basement. There is one step without handrail up to the entrance patient uses.      Patient's method of transportation is personal car.      The highest level of education was advanced degree; Master's in Education.      The patient currently retired.      Identified important Relationships are My daughter, good friend, Pamela [name] who is starting with dementia. Several women from church and [employer]AU[employer].      Pets : None       Interests / Fun: Read a lot, take walks, [employer] with church groups and [employer] groups.       Current Stressors: Cancer. And unable to get knee replacement last year 2/2 Covid pandemic and this year 2/2 Cancer. Hair is starting to fall out 2/2 chemo      Religious / Personal Beliefs: Episcopalian, Christian. I believe in God and Heaven and the Pamela [name].       Pamela [name], BSN, RN-BC       Social Drivers of Health   Financial Resource Strain: Low Risk  (12/16/2022)   Overall Financial Resource Strain (CARDIA)    Difficulty of Paying Living Expenses: Not hard at all  Food Insecurity: No Food Insecurity (01/26/2024)   Hunger Vital Sign    Worried About Running Out of Food in the Last Year: Never true    Ran Out of Food in the Last Year: Never true  Transportation Needs: No Transportation Needs (01/26/2024)   PRAPARE - Administrator, Civil Service (Medical): No    Lack of Transportation (Non-Medical):  No  Physical Activity: Insufficiently Active (01/26/2024)   Exercise Vital Sign    Days of Exercise per Week: 2 days    Minutes of Exercise per Session: 30 min  Stress: No Stress Concern Present (01/26/2024)   [employer] of Occupational Health - Occupational Stress Questionnaire    Feeling of Stress: Not at all  Social Connections: Moderately Integrated (01/26/2024)   Social Connection and Isolation Panel    Frequency of  Communication with Friends and Family: More than three times a week    Frequency of Social Gatherings with Friends and Family: More than three times a week    Attends Religious Services: More than 4 times per year    Active Member of [employer] or Organizations: Yes    Attends Banker Meetings: More than 4 times per year    Marital Status: Widowed   Family History  Problem Relation Age of Onset   Cancer Mother        H&N, smoker   Liver disease Mother    Cancer Sister        lung, 2011, smoker   Other Other        grandmother had mastectomy in her 21's unknown reason   Macular degeneration Father    Diverticulitis Father    Breast cancer Neg Hx     ASSESSMENT Recent Results: The most recent result is correlated with 19 mg per week: Lab Results  Component Value Date   INR 2.0 06/12/2024   INR 2.5 05/15/2024   INR 3.5 (A) 04/17/2024    Anticoagulation Dosing: Description   Take one-and-one-half (1 & 1/2) tablets of your 2 mg lavender colored warfarin tablets by mouth, once-daily, on all days of the week.     INR today: Therapeutic  PLAN Weekly dose was increased by 11% to 21 mg per week  Patient Instructions  Patient instructed to take medications as defined in the Anti-coagulation Track section of this encounter.  Patient instructed to take today's dose.  Patient instructed to take one-and-one-half (1 & 1/2) tablets of your 2 mg lavender colored warfarin tablets by mouth, once-daily, on all days of the week. Patient verbalized  understanding of these instructions.  Patient advised to contact clinic or seek medical attention if signs/symptoms of bleeding or thromboembolism occur.  Patient verbalized understanding by repeating back information and was advised to contact me if further medication-related questions arise. Patient was also provided an information handout.  Follow-up Return in about 3 weeks (around 07/03/2024).  Lynwood KATHEE Lites, PharmD, CPP Clinical Pharmacist Practitioner  15 minutes spent face-to-face with the patient during the encounter. 50% of time spent on education, including signs/sx bleeding and clotting, as well as food and drug interactions with warfarin. 50% of time was spent on fingerprick POC INR sample collection,processing, results determination, and documentation in [email or web address].

## 2024-07-03 ENCOUNTER — Ambulatory Visit (INDEPENDENT_AMBULATORY_CARE_PROVIDER_SITE_OTHER): Payer: Self-pay | Admitting: Pharmacist

## 2024-07-03 DIAGNOSIS — Z7901 Long term (current) use of anticoagulants: Secondary | ICD-10-CM

## 2024-07-03 DIAGNOSIS — D6869 Other thrombophilia: Secondary | ICD-10-CM | POA: Diagnosis not present

## 2024-07-03 LAB — POCT INR: INR: 3.6 — AB (ref 2.0–3.0)

## 2024-07-03 NOTE — Patient Instructions (Signed)
 Patient instructed to take medications as defined in the Anti-coagulation Track section of this encounter.  Patient instructed to take today's dose.  Patient instructed to take  one-and-one-half (1 & 1/2) tablets of your 2 mg lavender colored warfarin tablets by mouth, on all days of the week--EXCEPT on TUESDAYS and FRIDAYS, take only one (1) tablet on TUESDAYS and FRIDAYS.  Patient verbalized understanding of these instructions.

## 2024-07-03 NOTE — Progress Notes (Signed)
 Anticoagulation Management Pamela [name] is a 78 y.o. female who reports to the clinic for monitoring of warfarin treatment.    Indication: Secondary hypercoagulable state with history of DVT with recurrence; long term current use of oral anticoagulation with warfarin for INR 2.0 - 3.0. Patient prefers OAC w/warfarin secondary to her out of pocket costs when she had used DOAC therapy in the past.  Duration: indefinite Supervising physician: Jone Dauphin, MD  Anticoagulation Clinic Visit History: Patient does not report signs/symptoms of bleeding or thromboembolism  Other recent changes: No diet, medications, lifestyle changes identified.  Anticoagulation Episode Summary     Current INR goal:  2.0-3.0  TTR:  61.6% (13.5 y)  Next INR check:  07/31/2024  INR from last check:  3.6 (07/03/2024)  Weekly max warfarin dose:  --  Target end date:  Indefinite  INR check location:  Anticoagulation Clinic  Preferred lab:  --  Send INR reminders to:  [facility]   Indications   Secondary hypercoagulable state (HCC); hx of recurrent DVTs [D68.69] DVT HX OF (Resolved) [Z86.718] Long term (current) use of anticoagulants [Z79.01]        Comments:  --        Anticoagulation Care Providers     Provider Role Specialty Phone number   Otho Darnelle BRAVO, MD  Internal Medicine [phone]       Allergies[1] Current Medications[2] Past Medical History:  Diagnosis Date   Age-related nuclear cataract, bilateral 07/14/2018   Anemia    Anemia requiring transfusions 06/03/2006   Required post operative transfusion 01/2020. Mixed anemia with hx of macrocytic B12 deficiency anemia.    Arthritis    Bile acid malabsorption syndrome / Diarrhea, resolving over the years. 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 [facility] with [name] is following.   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    Employer: [employer]    Comment: Retired in 2011  Tobacco Use   Smoking status: Former    Current packs/day: 0.00    Average packs/day: 1 pack/day for 20.0 years (20.0 ttl pk-yrs)    Types: Cigarettes    Start date: 07/06/1965    Quit date: 07/06/1985    Years since quitting: 39.0   Smokeless tobacco: Never   Tobacco comments:    Quit 1988  Vaping Use   Vaping status: Never Used  Substance and Sexual Activity   Alcohol use: Yes    Comment: 2 glasses  of wine daily    Drug use: No   Sexual activity: Not on file  Other Topics Concern   Not on file  Social History Narrative   Worked as designer, 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       Current Social History 10/18/2019        Patient lives alone in a 2 level home where laundry is in the basement. There is one step without handrail up to the entrance patient uses.      Patient's method of transportation is personal car.      The highest level of education was advanced degree; Master's in Education.      The patient currently retired.      Identified important Relationships are My daughter,  good friend, Devere who is starting with dementia. Several women from church and [employer].      Pets : None       Interests / Fun: Read a lot, take walks, [employer] with church groups and [employer] groups.       Current Stressors: Cancer. And unable to get knee replacement last year 2/2 Covid pandemic and this year 2/2 Cancer. Hair is starting to fall out 2/2 chemo      Religious / Personal Beliefs: Episcopalian, Christian. I believe in God and Heaven and the [place].       [name], BSN, RN-BC       Social Drivers of Health   Tobacco Use: Medium Risk (01/26/2024)   Patient History    Smoking Tobacco Use: Former    Smokeless Tobacco Use: Never    Passive Exposure: Not on file  Financial Resource Strain: Low Risk (12/16/2022)   Overall Financial Resource Strain (CARDIA)    Difficulty of Paying Living Expenses: Not hard at all  Food Insecurity: No Food Insecurity (01/26/2024)   Epic    Worried About Programme Researcher, Broadcasting/film/video in the Last Year: Never true    Ran Out of Food in the Last Year: Never true  Transportation Needs: No Transportation Needs (01/26/2024)   Epic    Lack of Transportation (Medical): No    Lack of Transportation (Non-Medical): No  Physical Activity: Insufficiently Active (01/26/2024)   Exercise Vital Sign    Days of Exercise per Week: 2 days    Minutes of Exercise per Session: 30 min  Stress: No Stress Concern Present (01/26/2024)   [employer] of Occupational Health - Occupational Stress Questionnaire    Feeling of Stress: Not at all  Social Connections: Moderately Integrated (01/26/2024)   Social Connection and Isolation Panel    Frequency of Communication with Friends and Family: More than three times a week    Frequency of Social Gatherings with Friends and Family: More than three times a week    Attends Religious Services: More than 4 times per year    Active Member of [employer] or Organizations: Yes    Attends Banker Meetings: More than 4 times  per year    Marital Status: Widowed  Depression (PHQ2-9): Low Risk (03/27/2024)   Depression (PHQ2-9)    PHQ-2 Score: 0  Alcohol Screen: Low Risk (01/26/2024)   Alcohol Screen    Last Alcohol Screening Score (AUDIT): 5  Housing: Low Risk (01/26/2024)   Epic    Unable to Pay for Housing in the Last Year: No    Number of Times Moved in the Last Year: 0    Homeless in the Last  Year: No  Utilities: Not At Risk (01/26/2024)   Epic    Threatened with loss of utilities: No  Health Literacy: Adequate Health Literacy (01/26/2024)   B1300 Health Literacy    Frequency of need for help with medical instructions: Never   Family History  Problem Relation Age of Onset   Cancer Mother        H&N, smoker   Liver disease Mother    Cancer Sister        lung, 2011, smoker   Other Other        grandmother had mastectomy in her 19's unknown reason   Macular degeneration Father    Diverticulitis Father    Breast cancer Neg Hx     ASSESSMENT Recent Results: The most recent result is correlated with 21 mg per week: Lab Results  Component Value Date   INR 3.6 (A) 07/03/2024   INR 2.0 06/12/2024   INR 2.5 05/15/2024    Anticoagulation Dosing: Description   Take one-and-one-half (1 & 1/2) tablets of your 2 mg lavender colored warfarin tablets by mouth, on all days of the week--EXCEPT on TUESDAYS and FRIDAYS, take only one (1) tablet on TUESDAYS and FRIDAYS.      INR today: Supratherapeutic  PLAN Weekly dose was decreased by 9.5 % to 19 mg per week  Patient Instructions  Patient instructed to take medications as defined in the Anti-coagulation Track section of this encounter.  Patient instructed to take today's dose.  Patient instructed to take  one-and-one-half (1 & 1/2) tablets of your 2 mg lavender colored warfarin tablets by mouth, on all days of the week--EXCEPT on TUESDAYS and FRIDAYS, take only one (1) tablet on TUESDAYS and FRIDAYS.  Patient verbalized understanding of these  instructions.  Patient advised to contact clinic or seek medical attention if signs/symptoms of bleeding or thromboembolism occur.  Patient verbalized understanding by repeating back information and was advised to contact me if further medication-related questions arise. Patient was also provided an information handout.  Follow-up Return in about 4 weeks (around 07/31/2024) for Follow up INR.  Lynwood KATHEE Lites, PharmD, CPP Clinical Pharmacist Practitioner   15 minutes spent face-to-face with the patient during the encounter. 50% of time spent on education, including signs/sx bleeding and clotting, as well as food and drug interactions with warfarin. 50% of time was spent on fingerprick POC INR sample collection,processing, results determination, and documentation in [email or web address].    [1]  Allergies Allergen Reactions   Ace Inhibitors Cough  [2]  Current Outpatient Medications:    Cholecalciferol (VITAMIN D -3) 25 MCG (1000 UT) CAPS, Take 1 capsule by mouth daily., Disp: , Rfl:    denosumab  (PROLIA ) 60 MG/ML SOSY injection, Inject 60 mg into the skin every 6 (six) months., Disp: 1 mL, Rfl: 3   levothyroxine  (SYNTHROID ) 88 MCG tablet, Take 1 tablet (88 mcg total) by mouth daily before breakfast., Disp: 90 tablet, Rfl: 3   Magnesium  250 MG TABS, Take 1 tablet by mouth daily., Disp: , Rfl:    pantoprazole  (PROTONIX ) 20 MG tablet, Take 1 tablet (20 mg total) by mouth daily., Disp: 90 tablet, Rfl: 3   warfarin (COUMADIN ) 2 MG tablet, Take 1&1/2 tablets daily on all days of the week, EXCEPT on TUESDAYS and FRIDAYS. Take ONLY ONE (1) tablet on Tuesdays and Fridays., Disp: 40 tablet, Rfl: 3   colchicine  0.6 MG tablet, Take 1 tablet (0.6 mg total) by mouth 2 (two) times daily for gout flare-ups. (Patient not taking: Reported  on 07/03/2024), Disp: 20 tablet, Rfl: 1

## 2024-07-13 ENCOUNTER — Other Ambulatory Visit (HOSPITAL_COMMUNITY): Payer: Self-pay

## 2024-07-27 ENCOUNTER — Ambulatory Visit: Payer: Self-pay | Admitting: Student

## 2024-07-27 VITALS — BP 144/81 | HR 76 | Temp 99.0°F | Ht 64.0 in | Wt 178.4 lb

## 2024-07-27 DIAGNOSIS — M810 Age-related osteoporosis without current pathological fracture: Secondary | ICD-10-CM | POA: Diagnosis not present

## 2024-07-27 DIAGNOSIS — E559 Vitamin D deficiency, unspecified: Secondary | ICD-10-CM | POA: Diagnosis not present

## 2024-07-27 DIAGNOSIS — I10 Essential (primary) hypertension: Secondary | ICD-10-CM

## 2024-07-27 DIAGNOSIS — E039 Hypothyroidism, unspecified: Secondary | ICD-10-CM

## 2024-07-27 MED ORDER — DENOSUMAB 60 MG/ML ~~LOC~~ SOSY: 60.0000 mg | PREFILLED_SYRINGE | SUBCUTANEOUS | Status: DC

## 2024-07-27 NOTE — Progress Notes (Signed)
 Internal Medicine Clinic Attending  Case discussed with the resident at the time of the visit.  We reviewed the resident's history and exam and pertinent patient test results.  I agree with the assessment, diagnosis, and plan of care documented in the resident's note.

## 2024-07-27 NOTE — Assessment & Plan Note (Signed)
 Patient presents with a history of osteoporosis due to compression fracture, last DEXA was in 2024. Patient is a candidate for Prolia  due to current use of Prolia  injections, last injection date of July 2025 per patient . Discussed benefits and adverse effects of Prolia . Patient has agreed to start treatment with Prolia . Orders placed to start pre-authorization for Prolia  injections.  Recent GFR 44, Calcium 9.9. Patient has a remote history of hypocalcemia 5 years prior. She denies adverse effects from the prior Prolia  injections.  Plan: -BMP and vitamin D  level -Prolia  ordered, start prior authorization process -BMP 14 days after prolia  injection -OTC Caclium, Vitamin D  -Provided medication brochure and REMS information for reporting adverse effects of prolia 

## 2024-07-27 NOTE — Assessment & Plan Note (Signed)
 Last TSH checked was 0.605 one year prior. Stable on 88 mcg of synthroid . Will recheck TSH today

## 2024-07-27 NOTE — Patient Instructions (Signed)
 Thank you, [name] for allowing us  to provide your care today. Today we discussed osteoporosis and vitamin D  deficiency.    I have ordered the following labs for you:   Lab Orders         Basic metabolic panel with GFR         Vitamin D  (25 hydroxy)         TSH        Follow up: We will call you with the authorization for the Prolia  and schedule the injection!    Remember:   Should you have any questions or concerns please call the internal medicine clinic at [phone].     Please note that our late policy has changed.  If you are more than 15 minutes late to your appointment, you may be asked to reschedule your appointment.  Dr. Kandis, D.O. [facility]

## 2024-07-27 NOTE — Progress Notes (Signed)
 "  Established Patient Office Visit  Subjective   Patient ID: Pamela [name], female    DOB: 08-17-1945  Age: 79 y.o. MRN: [id number]  Chief Complaint  Patient presents with   Follow-up    Pamela [name] is a 79 y.o. who presents to the clinic for discussion of prolia , vitamin D  deficiency, and hypothyroidism. Please see problem based assessment and plan for additional details.    Patient Active Problem List   Diagnosis Date Noted   Vitamin D  deficiency 07/27/2024   CKD (chronic kidney disease) stage 3, GFR 30-59 ml/min (HCC) 01/05/2023   Hearing loss 07/23/2022   Retinal pigmentation 07/23/2022   Long term (current) use of anticoagulants 01/26/2022   Overweight (BMI 25.0-29.9) 03/20/2021   Port-A-Cath in place 04/30/2020   Hx of urothelial cancer (HCC), treated 08/11/2019   History of non anemic vitamin B12 deficiency 03/01/2018   History of gout 03/04/2015   GERD (gastroesophageal reflux disease) with nocturnal cough 03/03/2011   Osteoporosis, post-menopausal 04/24/2008   Essential hypertension, benign 04/24/2008   Secondary hypercoagulable state (HCC); hx of recurrent DVTs 06/03/2006   Hypothyroidism 04/21/2006      Objective:     BP (!) 144/81 (BP Location: Right Arm, Patient Position: Sitting, Cuff Size: Small)   Pulse 76   Temp 99 F (37.2 C) (Oral)   Ht 5' 4 (1.626 m)   Wt 178 lb 6.4 oz (80.9 kg)   SpO2 99%   BMI 30.62 kg/m  BP Readings from Last 3 Encounters:  07/27/24 (!) 144/81  03/27/24 (!) 148/69  09/23/23 137/77   Wt Readings from Last 3 Encounters:  07/27/24 178 lb 6.4 oz (80.9 kg)  03/27/24 177 lb 11.2 oz (80.6 kg)  01/26/24 175 lb (79.4 kg)      Physical Exam Vitals reviewed.  Constitutional:      General: She is not in acute distress.    Appearance: She is not ill-appearing, toxic-appearing or diaphoretic.  Cardiovascular:     Rate and Rhythm: Normal rate and regular rhythm.  Pulmonary:     Effort: Pulmonary effort is  normal. No respiratory distress.     Breath sounds: Normal breath sounds.  Skin:    General: Skin is warm and dry.  Neurological:     Mental Status: She is alert.  Psychiatric:        Mood and Affect: Mood and affect normal.    No results found for any visits on 07/27/24.  Last metabolic panel Lab Results  Component Value Date   GLUCOSE 96 03/27/2024   NA 139 03/27/2024   K 4.6 03/27/2024   CL 103 03/27/2024   CO2 28 03/27/2024   BUN 17 03/27/2024   CREATININE 1.24 (H) 03/27/2024   GFRNONAA 44 (L) 03/27/2024   CALCIUM 9.9 03/27/2024   PROT 7.3 03/27/2024   ALBUMIN  4.2 03/27/2024   BILITOT 0.9 03/27/2024   ALKPHOS 48 03/27/2024   AST 18 03/27/2024   ALT 10 03/27/2024   ANIONGAP 8 03/27/2024      The ASCVD Risk score (Arnett DK, et al., 2019) failed to calculate for the following reasons:   The 2019 ASCVD risk score is only valid for ages 63 to 83   * - Cholesterol units were assumed    Assessment & Plan:   Problem List Items Addressed This Visit       Cardiovascular and Mediastinum   Essential hypertension, benign (Chronic)     Endocrine   Hypothyroidism (Chronic)   Last  TSH checked was 0.605 one year prior. Stable on 88 mcg of synthroid . Will recheck TSH today       Relevant Orders   TSH     Musculoskeletal and Integument   Osteoporosis, post-menopausal - Primary   Patient presents with a history of osteoporosis due to compression fracture, last DEXA was in 2024. Patient is a candidate for Prolia  due to current use of Prolia  injections, last injection date of July 2025 per patient . Discussed benefits and adverse effects of Prolia . Patient has agreed to start treatment with Prolia . Orders placed to start pre-authorization for Prolia  injections.  Recent GFR 44, Calcium 9.9. Patient has a remote history of hypocalcemia 5 years prior. She denies adverse effects from the prior Prolia  injections.  Plan: -BMP and vitamin D  level -Prolia  ordered, start prior  authorization process -BMP 14 days after prolia  injection -OTC Caclium, Vitamin D  -Provided medication brochure and REMS information for reporting adverse effects of prolia       Relevant Medications   denosumab  (PROLIA ) injection 60 mg (Start on 07/27/2024  9:00 AM)   Other Relevant Orders   Basic metabolic panel with GFR   Vitamin D  (25 hydroxy)     Other   Vitamin D  deficiency   Patient has a history of vitamin D  deficiency 2 years prior and is on OTC vitamin D . Will recheck level today.       Relevant Orders   Vitamin D  (25 hydroxy)    Return in about 2 weeks (around 08/10/2024) for Prolia  injection with nurses.    Damien Lease, DO  "

## 2024-07-27 NOTE — Assessment & Plan Note (Signed)
 Patient has a history of vitamin D  deficiency 2 years prior and is on OTC vitamin D . Will recheck level today.

## 2024-07-28 ENCOUNTER — Ambulatory Visit: Payer: Self-pay | Admitting: Student

## 2024-07-28 ENCOUNTER — Other Ambulatory Visit (HOSPITAL_COMMUNITY): Payer: Self-pay

## 2024-07-28 DIAGNOSIS — E875 Hyperkalemia: Secondary | ICD-10-CM

## 2024-07-28 LAB — BASIC METABOLIC PANEL WITH GFR
BUN/Creatinine Ratio: 18 (ref 12–28)
BUN: 21 mg/dL (ref 8–27)
CO2: 21 mmol/L (ref 20–29)
Calcium: 9.5 mg/dL (ref 8.7–10.3)
Chloride: 103 mmol/L (ref 96–106)
Creatinine, Ser: 1.18 mg/dL — ABNORMAL HIGH (ref 0.57–1.00)
Glucose: 91 mg/dL (ref 70–99)
Potassium: 5.4 mmol/L — ABNORMAL HIGH (ref 3.5–5.2)
Sodium: 141 mmol/L (ref 134–144)
eGFR: 47 mL/min/1.73 — ABNORMAL LOW

## 2024-07-28 LAB — TSH: TSH: 0.702 u[IU]/mL (ref 0.450–4.500)

## 2024-07-28 LAB — VITAMIN D 25 HYDROXY (VIT D DEFICIENCY, FRACTURES): Vit D, 25-Hydroxy: 49.8 ng/mL (ref 30.0–100.0)

## 2024-07-28 MED ORDER — LOKELMA 10 G PO PACK
10.0000 g | PACK | Freq: Every day | ORAL | 0 refills | Status: AC
  Filled 2024-07-28: qty 2, 2d supply, fill #0

## 2024-07-31 ENCOUNTER — Ambulatory Visit: Payer: Self-pay

## 2024-08-02 ENCOUNTER — Other Ambulatory Visit: Payer: Self-pay

## 2024-08-02 NOTE — Progress Notes (Signed)
 Disenrolled - pharmacy unsuccessful in reaching patient, prolia  last filled 1.29.25 (364 days ago).

## 2024-08-03 MED ORDER — DENOSUMAB 60 MG/ML ~~LOC~~ SOSY: 60.0000 mg | PREFILLED_SYRINGE | SUBCUTANEOUS | Status: AC

## 2024-08-03 NOTE — Addendum Note (Signed)
 Addended by: KANDIS PERKINS on: 08/03/2024 02:54 PM   Modules accepted: Orders

## 2024-08-07 ENCOUNTER — Ambulatory Visit

## 2024-08-08 ENCOUNTER — Telehealth: Payer: Self-pay

## 2024-08-08 ENCOUNTER — Other Ambulatory Visit (HOSPITAL_COMMUNITY): Payer: Self-pay

## 2024-08-08 NOTE — Telephone Encounter (Signed)
 Prolia  VOB initiated via [email or web address]  Next Prolia  inj DUE: NEW START

## 2024-08-09 ENCOUNTER — Other Ambulatory Visit (HOSPITAL_COMMUNITY): Payer: Self-pay

## 2024-08-09 NOTE — Telephone Encounter (Signed)
 MEDICAL PA SUBMITTED VIA LATENT. KEY: [id number]   [facility] PREFERRED FOR PHARMACY BENEFIT. PA SUBMITTED VIA LATENT. KEY: [id number]

## 2024-08-09 NOTE — Telephone Encounter (Signed)
 Pamela [name]

## 2024-08-10 ENCOUNTER — Other Ambulatory Visit (HOSPITAL_COMMUNITY): Payer: Self-pay

## 2024-08-10 NOTE — Telephone Encounter (Signed)
 Pt ready for scheduling for PROLIA  on or after : 08/10/24  Option# 1: Buy/Bill (Office supplied medication)  Out-of-pocket cost due at time of clinic visit: $352  Number of injection/visits approved: 2  Primary: [employer] Prolia  co-insurance: 20% Admin fee co-insurance: 0%  Secondary: --- Prolia  co-insurance:  Admin fee co-insurance:   Medical Benefit Details: Date Benefits were checked: 08/09/24 Deductible: NO/ Coinsurance: 20%/ Admin Fee: 0%  Prior Auth: APPROVED PA# [id number] Expiration Date: 08/09/24-08/09/25  # of doses approved: 2 ----------------------------------------------------------------------- Option# 2- Med Obtained from pharmacy: [facility] PREFERRED FOR PHARMACY BENEFIT   Pharmacy benefit: Copay $512.50 (Paid to pharmacy) Admin Fee: 0% (Pay at clinic)  Prior Auth: APPROVED PA# [id number] Expiration Date: 08/09/24-08/09/25  # of doses approved: 2   If patient wants fill through the pharmacy benefit please send prescription to: [facility], and include estimated need by date in rx notes. Pharmacy will ship medication directly to the office.  Patient NOT eligible for Prolia  Copay Card. Copay Card can make patient's cost as little as $25. Link to apply: [email or web address]  ** This summary of benefits is an estimation of the patient's out-of-pocket cost. Exact cost may very based on individual plan coverage.

## 2024-08-14 ENCOUNTER — Ambulatory Visit

## 2025-01-31 ENCOUNTER — Ambulatory Visit

## 2025-03-29 ENCOUNTER — Ambulatory Visit: Admitting: Internal Medicine

## 2025-03-29 ENCOUNTER — Other Ambulatory Visit
